# Patient Record
Sex: Male | Born: 1950
Health system: Southern US, Community
[De-identification: ages and names within clinical notes are randomized; demographics above are authoritative.]

## PROBLEM LIST (undated history)

## (undated) DIAGNOSIS — C189 Malignant neoplasm of colon, unspecified: Secondary | ICD-10-CM

## (undated) DIAGNOSIS — M1990 Unspecified osteoarthritis, unspecified site: Secondary | ICD-10-CM

## (undated) DIAGNOSIS — Z8619 Personal history of other infectious and parasitic diseases: Secondary | ICD-10-CM

## (undated) DIAGNOSIS — J302 Other seasonal allergic rhinitis: Secondary | ICD-10-CM

## (undated) DIAGNOSIS — F419 Anxiety disorder, unspecified: Secondary | ICD-10-CM

## (undated) DIAGNOSIS — Z87442 Personal history of urinary calculi: Secondary | ICD-10-CM

## (undated) DIAGNOSIS — M549 Dorsalgia, unspecified: Secondary | ICD-10-CM

## (undated) DIAGNOSIS — R899 Unspecified abnormal finding in specimens from other organs, systems and tissues: Secondary | ICD-10-CM

## (undated) DIAGNOSIS — F32A Depression, unspecified: Secondary | ICD-10-CM

## (undated) DIAGNOSIS — N200 Calculus of kidney: Secondary | ICD-10-CM

## (undated) DIAGNOSIS — Z9221 Personal history of antineoplastic chemotherapy: Secondary | ICD-10-CM

## (undated) DIAGNOSIS — C9000 Multiple myeloma not having achieved remission: Secondary | ICD-10-CM

## (undated) DIAGNOSIS — F329 Major depressive disorder, single episode, unspecified: Secondary | ICD-10-CM

## (undated) DIAGNOSIS — E039 Hypothyroidism, unspecified: Secondary | ICD-10-CM

## (undated) DIAGNOSIS — T7840XA Allergy, unspecified, initial encounter: Secondary | ICD-10-CM

## (undated) DIAGNOSIS — J45909 Unspecified asthma, uncomplicated: Secondary | ICD-10-CM

## (undated) DIAGNOSIS — G4700 Insomnia, unspecified: Secondary | ICD-10-CM

## (undated) DIAGNOSIS — M810 Age-related osteoporosis without current pathological fracture: Secondary | ICD-10-CM

## (undated) DIAGNOSIS — K219 Gastro-esophageal reflux disease without esophagitis: Secondary | ICD-10-CM

## (undated) DIAGNOSIS — G8929 Other chronic pain: Secondary | ICD-10-CM

## (undated) HISTORY — DX: Other chronic pain: G89.29

## (undated) HISTORY — PX: SPINAL CORD STIMULATOR IMPLANT: SHX2422

## (undated) HISTORY — DX: Multiple myeloma not having achieved remission: C90.00

## (undated) HISTORY — PX: LUMBAR FUSION: SHX111

## (undated) HISTORY — PX: BACK SURGERY: SHX140

## (undated) HISTORY — DX: Anxiety disorder, unspecified: F41.9

## (undated) HISTORY — PX: SHOULDER SURGERY: SHX246

## (undated) HISTORY — DX: Allergy, unspecified, initial encounter: T78.40XA

## (undated) HISTORY — DX: Calculus of kidney: N20.0

## (undated) HISTORY — DX: Age-related osteoporosis without current pathological fracture: M81.0

## (undated) HISTORY — DX: Malignant neoplasm of colon, unspecified: C18.9

## (undated) HISTORY — PX: SACROILIAC JOINT FUSION: SHX6088

## (undated) HISTORY — DX: Dorsalgia, unspecified: M54.9

## (undated) HISTORY — DX: Unspecified asthma, uncomplicated: J45.909

## (undated) HISTORY — PX: EYE SURGERY: SHX253

## (undated) HISTORY — PX: COLONOSCOPY: SHX174

## (undated) HISTORY — DX: Depression, unspecified: F32.A

## (undated) HISTORY — DX: Personal history of antineoplastic chemotherapy: Z92.21

## (undated) HISTORY — DX: Major depressive disorder, single episode, unspecified: F32.9

---
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
## 1968-02-06 HISTORY — PX: HERNIA REPAIR: SHX51

## 1969-12-08 DIAGNOSIS — F419 Anxiety disorder, unspecified: Secondary | ICD-10-CM

## 1969-12-08 HISTORY — DX: Anxiety disorder, unspecified: F41.9

## 1999-02-06 HISTORY — PX: COLON SURGERY: SHX602

## 1999-11-06 DIAGNOSIS — C189 Malignant neoplasm of colon, unspecified: Secondary | ICD-10-CM

## 1999-11-06 HISTORY — DX: Malignant neoplasm of colon, unspecified: C18.9

## 2010-10-07 HISTORY — PX: ESOPHAGOGASTRODUODENOSCOPY: SHX1529

## 2011-12-07 DIAGNOSIS — C9000 Multiple myeloma not having achieved remission: Secondary | ICD-10-CM

## 2011-12-07 HISTORY — DX: Multiple myeloma not having achieved remission: C90.00

## 2012-06-27 ENCOUNTER — Ambulatory Visit: Payer: Self-pay | Admitting: Internal Medicine

## 2012-06-27 LAB — HEPATIC FUNCTION PANEL A (ARMC)
Albumin: 3.5 g/dL (ref 3.4–5.0)
Alkaline Phosphatase: 91 U/L (ref 50–136)
SGOT(AST): 17 U/L (ref 15–37)
Total Protein: 6.7 g/dL (ref 6.4–8.2)

## 2012-06-27 LAB — CBC CANCER CENTER
Eosinophil #: 0.1 x10 3/mm (ref 0.0–0.7)
Eosinophil %: 2.4 %
HGB: 13.4 g/dL (ref 13.0–18.0)
Lymphocyte #: 1.9 x10 3/mm (ref 1.0–3.6)
Lymphocyte %: 31 %
MCH: 31.4 pg (ref 26.0–34.0)
MCV: 94 fL (ref 80–100)
Neutrophil %: 58.5 %
Platelet: 335 x10 3/mm (ref 150–440)
RDW: 14 % (ref 11.5–14.5)
WBC: 6.2 x10 3/mm (ref 3.8–10.6)

## 2012-06-27 LAB — CREATININE, SERUM
Creatinine: 1.14 mg/dL (ref 0.60–1.30)
EGFR (African American): >60

## 2012-07-01 LAB — PROT IMMUNOELECTROPHORES(ARMC)

## 2012-07-01 LAB — URINE IEP, RANDOM

## 2012-07-01 LAB — CEA: CEA: 2.2 ng/mL (ref 0.0–4.7)

## 2012-07-06 ENCOUNTER — Ambulatory Visit: Payer: Self-pay | Admitting: Internal Medicine

## 2012-07-22 ENCOUNTER — Emergency Department: Payer: Self-pay | Admitting: Emergency Medicine

## 2012-08-11 ENCOUNTER — Ambulatory Visit: Payer: Self-pay | Admitting: Pain Medicine

## 2012-08-13 ENCOUNTER — Ambulatory Visit: Payer: Self-pay | Admitting: Pain Medicine

## 2012-08-27 ENCOUNTER — Ambulatory Visit: Payer: Self-pay | Admitting: Pain Medicine

## 2012-08-29 ENCOUNTER — Ambulatory Visit: Payer: Self-pay | Admitting: Pain Medicine

## 2012-09-02 ENCOUNTER — Ambulatory Visit: Payer: Self-pay | Admitting: Pain Medicine

## 2012-09-17 ENCOUNTER — Ambulatory Visit: Payer: Self-pay | Admitting: Pain Medicine

## 2012-11-07 ENCOUNTER — Ambulatory Visit: Payer: Self-pay | Admitting: Specialist

## 2012-12-11 ENCOUNTER — Ambulatory Visit: Payer: Self-pay | Admitting: Family Medicine

## 2012-12-26 ENCOUNTER — Ambulatory Visit: Payer: Self-pay | Admitting: Internal Medicine

## 2012-12-26 LAB — CBC CANCER CENTER
Basophil %: 0.3 %
HCT: 44.1 % (ref 40.0–52.0)
HGB: 14.7 g/dL (ref 13.0–18.0)
Lymphocyte %: 14.6 %
MCH: 32.6 pg (ref 26.0–34.0)
Neutrophil #: 7.7 x10 3/mm — ABNORMAL HIGH (ref 1.4–6.5)
Neutrophil %: 78.8 %
RBC: 4.53 10*6/uL (ref 4.40–5.90)
RDW: 12.4 % (ref 11.5–14.5)

## 2012-12-26 LAB — CREATININE, SERUM
EGFR (African American): >60
EGFR (Non-African Amer.): >60

## 2012-12-26 LAB — CALCIUM: Calcium, Total: 9.2 mg/dL (ref 8.5–10.1)

## 2012-12-29 LAB — PROT IMMUNOELECTROPHORES(ARMC)

## 2013-01-05 ENCOUNTER — Ambulatory Visit: Payer: Self-pay | Admitting: Internal Medicine

## 2013-06-25 ENCOUNTER — Ambulatory Visit: Payer: Self-pay | Admitting: Internal Medicine

## 2013-06-25 LAB — CBC CANCER CENTER
BASOS ABS: 0.1 x10 3/mm (ref 0.0–0.1)
BASOS PCT: 2.5 %
EOS ABS: 0.2 x10 3/mm (ref 0.0–0.7)
Eosinophil %: 3.1 %
HCT: 39.5 % — ABNORMAL LOW (ref 40.0–52.0)
HGB: 13.8 g/dL (ref 13.0–18.0)
LYMPHS PCT: 35.1 %
Lymphocyte #: 2 x10 3/mm (ref 1.0–3.6)
MCH: 32.3 pg (ref 26.0–34.0)
MCHC: 34.9 g/dL (ref 32.0–36.0)
MCV: 92 fL (ref 80–100)
Monocyte #: 0.4 x10 3/mm (ref 0.2–1.0)
Monocyte %: 6.3 %
Neutrophil #: 3.1 x10 3/mm (ref 1.4–6.5)
Neutrophil %: 53 %
PLATELETS: 322 x10 3/mm (ref 150–440)
RBC: 4.27 10*6/uL — ABNORMAL LOW (ref 4.40–5.90)
RDW: 12.1 % (ref 11.5–14.5)
WBC: 5.8 x10 3/mm (ref 3.8–10.6)

## 2013-06-25 LAB — CREATININE, SERUM
CREATININE: 1.19 mg/dL (ref 0.60–1.30)
EGFR (African American): >60

## 2013-06-25 LAB — CALCIUM: Calcium, Total: 8.6 mg/dL (ref 8.5–10.1)

## 2013-06-30 LAB — PROT IMMUNOELECTROPHORES(ARMC)

## 2013-06-30 LAB — KAPPA/LAMBDA FREE LIGHT CHAINS (ARMC)

## 2013-07-06 ENCOUNTER — Ambulatory Visit: Payer: Self-pay | Admitting: Internal Medicine

## 2013-12-28 ENCOUNTER — Ambulatory Visit: Payer: Self-pay | Admitting: Internal Medicine

## 2013-12-28 DIAGNOSIS — Z79899 Other long term (current) drug therapy: Secondary | ICD-10-CM | POA: Diagnosis not present

## 2013-12-28 DIAGNOSIS — Z85038 Personal history of other malignant neoplasm of large intestine: Secondary | ICD-10-CM | POA: Diagnosis not present

## 2013-12-28 DIAGNOSIS — C9000 Multiple myeloma not having achieved remission: Secondary | ICD-10-CM | POA: Diagnosis not present

## 2013-12-28 DIAGNOSIS — M818 Other osteoporosis without current pathological fracture: Secondary | ICD-10-CM | POA: Diagnosis not present

## 2013-12-28 LAB — CBC CANCER CENTER
Basophil #: 0 x10 3/mm (ref 0.0–0.1)
Basophil %: 0.6 %
EOS PCT: 1.7 %
Eosinophil #: 0.1 x10 3/mm (ref 0.0–0.7)
HCT: 43.2 % (ref 40.0–52.0)
HGB: 14.4 g/dL (ref 13.0–18.0)
LYMPHS ABS: 2.4 x10 3/mm (ref 1.0–3.6)
LYMPHS PCT: 33.3 %
MCH: 32.6 pg (ref 26.0–34.0)
MCHC: 33.3 g/dL (ref 32.0–36.0)
MCV: 98 fL (ref 80–100)
MONOS PCT: 7.3 %
Monocyte #: 0.5 x10 3/mm (ref 0.2–1.0)
Neutrophil #: 4.1 x10 3/mm (ref 1.4–6.5)
Neutrophil %: 57.1 %
Platelet: 363 x10 3/mm (ref 150–440)
RBC: 4.41 10*6/uL (ref 4.40–5.90)
RDW: 12.5 % (ref 11.5–14.5)
WBC: 7.2 x10 3/mm (ref 3.8–10.6)

## 2013-12-28 LAB — CREATININE, SERUM
CREATININE: 1.34 mg/dL — AB (ref 0.60–1.30)
EGFR (African American): >60
EGFR (Non-African Amer.): 57 — ABNORMAL LOW

## 2013-12-28 LAB — CALCIUM: Calcium, Total: 9.6 mg/dL (ref 8.5–10.1)

## 2013-12-29 LAB — PROT IMMUNOELECTROPHORES(ARMC)

## 2013-12-29 LAB — KAPPA/LAMBDA FREE LIGHT CHAINS (ARMC)

## 2014-01-05 ENCOUNTER — Ambulatory Visit: Payer: Self-pay | Admitting: Internal Medicine

## 2014-01-22 DIAGNOSIS — M62830 Muscle spasm of back: Secondary | ICD-10-CM | POA: Diagnosis not present

## 2014-01-22 DIAGNOSIS — M9902 Segmental and somatic dysfunction of thoracic region: Secondary | ICD-10-CM | POA: Diagnosis not present

## 2014-01-22 DIAGNOSIS — M9901 Segmental and somatic dysfunction of cervical region: Secondary | ICD-10-CM | POA: Diagnosis not present

## 2014-01-22 DIAGNOSIS — M5414 Radiculopathy, thoracic region: Secondary | ICD-10-CM | POA: Diagnosis not present

## 2014-01-23 DIAGNOSIS — M9902 Segmental and somatic dysfunction of thoracic region: Secondary | ICD-10-CM | POA: Diagnosis not present

## 2014-01-23 DIAGNOSIS — M5414 Radiculopathy, thoracic region: Secondary | ICD-10-CM | POA: Diagnosis not present

## 2014-01-23 DIAGNOSIS — M62830 Muscle spasm of back: Secondary | ICD-10-CM | POA: Diagnosis not present

## 2014-01-23 DIAGNOSIS — M9901 Segmental and somatic dysfunction of cervical region: Secondary | ICD-10-CM | POA: Diagnosis not present

## 2014-01-26 DIAGNOSIS — M9901 Segmental and somatic dysfunction of cervical region: Secondary | ICD-10-CM | POA: Diagnosis not present

## 2014-01-26 DIAGNOSIS — M62830 Muscle spasm of back: Secondary | ICD-10-CM | POA: Diagnosis not present

## 2014-01-26 DIAGNOSIS — M5414 Radiculopathy, thoracic region: Secondary | ICD-10-CM | POA: Diagnosis not present

## 2014-01-26 DIAGNOSIS — M9902 Segmental and somatic dysfunction of thoracic region: Secondary | ICD-10-CM | POA: Diagnosis not present

## 2014-01-27 DIAGNOSIS — M5414 Radiculopathy, thoracic region: Secondary | ICD-10-CM | POA: Diagnosis not present

## 2014-01-27 DIAGNOSIS — M9902 Segmental and somatic dysfunction of thoracic region: Secondary | ICD-10-CM | POA: Diagnosis not present

## 2014-01-27 DIAGNOSIS — M62830 Muscle spasm of back: Secondary | ICD-10-CM | POA: Diagnosis not present

## 2014-01-27 DIAGNOSIS — M9901 Segmental and somatic dysfunction of cervical region: Secondary | ICD-10-CM | POA: Diagnosis not present

## 2014-01-28 DIAGNOSIS — M9902 Segmental and somatic dysfunction of thoracic region: Secondary | ICD-10-CM | POA: Diagnosis not present

## 2014-01-28 DIAGNOSIS — M9901 Segmental and somatic dysfunction of cervical region: Secondary | ICD-10-CM | POA: Diagnosis not present

## 2014-01-28 DIAGNOSIS — M5414 Radiculopathy, thoracic region: Secondary | ICD-10-CM | POA: Diagnosis not present

## 2014-01-28 DIAGNOSIS — M62830 Muscle spasm of back: Secondary | ICD-10-CM | POA: Diagnosis not present

## 2014-02-02 DIAGNOSIS — M9902 Segmental and somatic dysfunction of thoracic region: Secondary | ICD-10-CM | POA: Diagnosis not present

## 2014-02-02 DIAGNOSIS — M5414 Radiculopathy, thoracic region: Secondary | ICD-10-CM | POA: Diagnosis not present

## 2014-02-02 DIAGNOSIS — M62830 Muscle spasm of back: Secondary | ICD-10-CM | POA: Diagnosis not present

## 2014-02-02 DIAGNOSIS — M9901 Segmental and somatic dysfunction of cervical region: Secondary | ICD-10-CM | POA: Diagnosis not present

## 2014-02-03 DIAGNOSIS — M5414 Radiculopathy, thoracic region: Secondary | ICD-10-CM | POA: Diagnosis not present

## 2014-02-03 DIAGNOSIS — M9902 Segmental and somatic dysfunction of thoracic region: Secondary | ICD-10-CM | POA: Diagnosis not present

## 2014-02-03 DIAGNOSIS — M62830 Muscle spasm of back: Secondary | ICD-10-CM | POA: Diagnosis not present

## 2014-02-03 DIAGNOSIS — M9901 Segmental and somatic dysfunction of cervical region: Secondary | ICD-10-CM | POA: Diagnosis not present

## 2014-02-04 DIAGNOSIS — M62830 Muscle spasm of back: Secondary | ICD-10-CM | POA: Diagnosis not present

## 2014-02-04 DIAGNOSIS — M9901 Segmental and somatic dysfunction of cervical region: Secondary | ICD-10-CM | POA: Diagnosis not present

## 2014-02-04 DIAGNOSIS — M9902 Segmental and somatic dysfunction of thoracic region: Secondary | ICD-10-CM | POA: Diagnosis not present

## 2014-02-04 DIAGNOSIS — M5414 Radiculopathy, thoracic region: Secondary | ICD-10-CM | POA: Diagnosis not present

## 2014-02-08 DIAGNOSIS — M62830 Muscle spasm of back: Secondary | ICD-10-CM | POA: Diagnosis not present

## 2014-02-08 DIAGNOSIS — M9901 Segmental and somatic dysfunction of cervical region: Secondary | ICD-10-CM | POA: Diagnosis not present

## 2014-02-08 DIAGNOSIS — M5414 Radiculopathy, thoracic region: Secondary | ICD-10-CM | POA: Diagnosis not present

## 2014-02-08 DIAGNOSIS — M9902 Segmental and somatic dysfunction of thoracic region: Secondary | ICD-10-CM | POA: Diagnosis not present

## 2014-02-10 DIAGNOSIS — M62830 Muscle spasm of back: Secondary | ICD-10-CM | POA: Diagnosis not present

## 2014-02-10 DIAGNOSIS — M5414 Radiculopathy, thoracic region: Secondary | ICD-10-CM | POA: Diagnosis not present

## 2014-02-10 DIAGNOSIS — M9901 Segmental and somatic dysfunction of cervical region: Secondary | ICD-10-CM | POA: Diagnosis not present

## 2014-02-10 DIAGNOSIS — M9902 Segmental and somatic dysfunction of thoracic region: Secondary | ICD-10-CM | POA: Diagnosis not present

## 2014-02-11 DIAGNOSIS — M62830 Muscle spasm of back: Secondary | ICD-10-CM | POA: Diagnosis not present

## 2014-02-11 DIAGNOSIS — M5414 Radiculopathy, thoracic region: Secondary | ICD-10-CM | POA: Diagnosis not present

## 2014-02-11 DIAGNOSIS — M9902 Segmental and somatic dysfunction of thoracic region: Secondary | ICD-10-CM | POA: Diagnosis not present

## 2014-02-11 DIAGNOSIS — M9901 Segmental and somatic dysfunction of cervical region: Secondary | ICD-10-CM | POA: Diagnosis not present

## 2014-02-15 DIAGNOSIS — M9901 Segmental and somatic dysfunction of cervical region: Secondary | ICD-10-CM | POA: Diagnosis not present

## 2014-02-15 DIAGNOSIS — M5414 Radiculopathy, thoracic region: Secondary | ICD-10-CM | POA: Diagnosis not present

## 2014-02-15 DIAGNOSIS — M62830 Muscle spasm of back: Secondary | ICD-10-CM | POA: Diagnosis not present

## 2014-02-15 DIAGNOSIS — M9902 Segmental and somatic dysfunction of thoracic region: Secondary | ICD-10-CM | POA: Diagnosis not present

## 2014-02-17 DIAGNOSIS — M62830 Muscle spasm of back: Secondary | ICD-10-CM | POA: Diagnosis not present

## 2014-02-17 DIAGNOSIS — M9902 Segmental and somatic dysfunction of thoracic region: Secondary | ICD-10-CM | POA: Diagnosis not present

## 2014-02-17 DIAGNOSIS — M9901 Segmental and somatic dysfunction of cervical region: Secondary | ICD-10-CM | POA: Diagnosis not present

## 2014-02-17 DIAGNOSIS — M5414 Radiculopathy, thoracic region: Secondary | ICD-10-CM | POA: Diagnosis not present

## 2014-02-18 DIAGNOSIS — M9901 Segmental and somatic dysfunction of cervical region: Secondary | ICD-10-CM | POA: Diagnosis not present

## 2014-02-18 DIAGNOSIS — M9902 Segmental and somatic dysfunction of thoracic region: Secondary | ICD-10-CM | POA: Diagnosis not present

## 2014-02-18 DIAGNOSIS — M5414 Radiculopathy, thoracic region: Secondary | ICD-10-CM | POA: Diagnosis not present

## 2014-02-18 DIAGNOSIS — M62830 Muscle spasm of back: Secondary | ICD-10-CM | POA: Diagnosis not present

## 2014-02-22 DIAGNOSIS — M5414 Radiculopathy, thoracic region: Secondary | ICD-10-CM | POA: Diagnosis not present

## 2014-02-22 DIAGNOSIS — M9901 Segmental and somatic dysfunction of cervical region: Secondary | ICD-10-CM | POA: Diagnosis not present

## 2014-02-22 DIAGNOSIS — M9902 Segmental and somatic dysfunction of thoracic region: Secondary | ICD-10-CM | POA: Diagnosis not present

## 2014-02-22 DIAGNOSIS — M62830 Muscle spasm of back: Secondary | ICD-10-CM | POA: Diagnosis not present

## 2014-02-24 DIAGNOSIS — M5414 Radiculopathy, thoracic region: Secondary | ICD-10-CM | POA: Diagnosis not present

## 2014-02-24 DIAGNOSIS — M9902 Segmental and somatic dysfunction of thoracic region: Secondary | ICD-10-CM | POA: Diagnosis not present

## 2014-02-24 DIAGNOSIS — M62830 Muscle spasm of back: Secondary | ICD-10-CM | POA: Diagnosis not present

## 2014-02-24 DIAGNOSIS — M9901 Segmental and somatic dysfunction of cervical region: Secondary | ICD-10-CM | POA: Diagnosis not present

## 2014-03-01 DIAGNOSIS — M9901 Segmental and somatic dysfunction of cervical region: Secondary | ICD-10-CM | POA: Diagnosis not present

## 2014-03-01 DIAGNOSIS — M62830 Muscle spasm of back: Secondary | ICD-10-CM | POA: Diagnosis not present

## 2014-03-01 DIAGNOSIS — M5414 Radiculopathy, thoracic region: Secondary | ICD-10-CM | POA: Diagnosis not present

## 2014-03-01 DIAGNOSIS — M9902 Segmental and somatic dysfunction of thoracic region: Secondary | ICD-10-CM | POA: Diagnosis not present

## 2014-03-04 DIAGNOSIS — M9901 Segmental and somatic dysfunction of cervical region: Secondary | ICD-10-CM | POA: Diagnosis not present

## 2014-03-04 DIAGNOSIS — M62830 Muscle spasm of back: Secondary | ICD-10-CM | POA: Diagnosis not present

## 2014-03-04 DIAGNOSIS — M5414 Radiculopathy, thoracic region: Secondary | ICD-10-CM | POA: Diagnosis not present

## 2014-03-04 DIAGNOSIS — M9902 Segmental and somatic dysfunction of thoracic region: Secondary | ICD-10-CM | POA: Diagnosis not present

## 2014-03-08 DIAGNOSIS — M62830 Muscle spasm of back: Secondary | ICD-10-CM | POA: Diagnosis not present

## 2014-03-08 DIAGNOSIS — M5414 Radiculopathy, thoracic region: Secondary | ICD-10-CM | POA: Diagnosis not present

## 2014-03-08 DIAGNOSIS — M9902 Segmental and somatic dysfunction of thoracic region: Secondary | ICD-10-CM | POA: Diagnosis not present

## 2014-03-08 DIAGNOSIS — M9901 Segmental and somatic dysfunction of cervical region: Secondary | ICD-10-CM | POA: Diagnosis not present

## 2014-03-11 DIAGNOSIS — M9901 Segmental and somatic dysfunction of cervical region: Secondary | ICD-10-CM | POA: Diagnosis not present

## 2014-03-11 DIAGNOSIS — M62830 Muscle spasm of back: Secondary | ICD-10-CM | POA: Diagnosis not present

## 2014-03-11 DIAGNOSIS — M9902 Segmental and somatic dysfunction of thoracic region: Secondary | ICD-10-CM | POA: Diagnosis not present

## 2014-03-11 DIAGNOSIS — M5414 Radiculopathy, thoracic region: Secondary | ICD-10-CM | POA: Diagnosis not present

## 2014-03-15 DIAGNOSIS — M62830 Muscle spasm of back: Secondary | ICD-10-CM | POA: Diagnosis not present

## 2014-03-15 DIAGNOSIS — M5414 Radiculopathy, thoracic region: Secondary | ICD-10-CM | POA: Diagnosis not present

## 2014-03-15 DIAGNOSIS — M9902 Segmental and somatic dysfunction of thoracic region: Secondary | ICD-10-CM | POA: Diagnosis not present

## 2014-03-15 DIAGNOSIS — M9901 Segmental and somatic dysfunction of cervical region: Secondary | ICD-10-CM | POA: Diagnosis not present

## 2014-03-18 DIAGNOSIS — M9902 Segmental and somatic dysfunction of thoracic region: Secondary | ICD-10-CM | POA: Diagnosis not present

## 2014-03-18 DIAGNOSIS — M5414 Radiculopathy, thoracic region: Secondary | ICD-10-CM | POA: Diagnosis not present

## 2014-03-18 DIAGNOSIS — M62830 Muscle spasm of back: Secondary | ICD-10-CM | POA: Diagnosis not present

## 2014-03-18 DIAGNOSIS — M9901 Segmental and somatic dysfunction of cervical region: Secondary | ICD-10-CM | POA: Diagnosis not present

## 2014-03-25 DIAGNOSIS — M9901 Segmental and somatic dysfunction of cervical region: Secondary | ICD-10-CM | POA: Diagnosis not present

## 2014-03-25 DIAGNOSIS — M9902 Segmental and somatic dysfunction of thoracic region: Secondary | ICD-10-CM | POA: Diagnosis not present

## 2014-03-25 DIAGNOSIS — M5414 Radiculopathy, thoracic region: Secondary | ICD-10-CM | POA: Diagnosis not present

## 2014-03-25 DIAGNOSIS — M62830 Muscle spasm of back: Secondary | ICD-10-CM | POA: Diagnosis not present

## 2014-03-29 DIAGNOSIS — M5414 Radiculopathy, thoracic region: Secondary | ICD-10-CM | POA: Diagnosis not present

## 2014-03-29 DIAGNOSIS — M62830 Muscle spasm of back: Secondary | ICD-10-CM | POA: Diagnosis not present

## 2014-03-29 DIAGNOSIS — M9902 Segmental and somatic dysfunction of thoracic region: Secondary | ICD-10-CM | POA: Diagnosis not present

## 2014-03-29 DIAGNOSIS — M9901 Segmental and somatic dysfunction of cervical region: Secondary | ICD-10-CM | POA: Diagnosis not present

## 2014-03-31 DIAGNOSIS — M9901 Segmental and somatic dysfunction of cervical region: Secondary | ICD-10-CM | POA: Diagnosis not present

## 2014-03-31 DIAGNOSIS — M9902 Segmental and somatic dysfunction of thoracic region: Secondary | ICD-10-CM | POA: Diagnosis not present

## 2014-03-31 DIAGNOSIS — M5414 Radiculopathy, thoracic region: Secondary | ICD-10-CM | POA: Diagnosis not present

## 2014-03-31 DIAGNOSIS — M62830 Muscle spasm of back: Secondary | ICD-10-CM | POA: Diagnosis not present

## 2014-04-05 DIAGNOSIS — M62830 Muscle spasm of back: Secondary | ICD-10-CM | POA: Diagnosis not present

## 2014-04-05 DIAGNOSIS — M9902 Segmental and somatic dysfunction of thoracic region: Secondary | ICD-10-CM | POA: Diagnosis not present

## 2014-04-05 DIAGNOSIS — M9901 Segmental and somatic dysfunction of cervical region: Secondary | ICD-10-CM | POA: Diagnosis not present

## 2014-04-05 DIAGNOSIS — M5414 Radiculopathy, thoracic region: Secondary | ICD-10-CM | POA: Diagnosis not present

## 2014-04-07 DIAGNOSIS — M5414 Radiculopathy, thoracic region: Secondary | ICD-10-CM | POA: Diagnosis not present

## 2014-04-07 DIAGNOSIS — M62830 Muscle spasm of back: Secondary | ICD-10-CM | POA: Diagnosis not present

## 2014-04-07 DIAGNOSIS — M9901 Segmental and somatic dysfunction of cervical region: Secondary | ICD-10-CM | POA: Diagnosis not present

## 2014-04-07 DIAGNOSIS — M9902 Segmental and somatic dysfunction of thoracic region: Secondary | ICD-10-CM | POA: Diagnosis not present

## 2014-04-12 DIAGNOSIS — M9902 Segmental and somatic dysfunction of thoracic region: Secondary | ICD-10-CM | POA: Diagnosis not present

## 2014-04-12 DIAGNOSIS — M62830 Muscle spasm of back: Secondary | ICD-10-CM | POA: Diagnosis not present

## 2014-04-12 DIAGNOSIS — M9901 Segmental and somatic dysfunction of cervical region: Secondary | ICD-10-CM | POA: Diagnosis not present

## 2014-04-12 DIAGNOSIS — M5414 Radiculopathy, thoracic region: Secondary | ICD-10-CM | POA: Diagnosis not present

## 2014-04-22 DIAGNOSIS — M62830 Muscle spasm of back: Secondary | ICD-10-CM | POA: Diagnosis not present

## 2014-04-22 DIAGNOSIS — M9902 Segmental and somatic dysfunction of thoracic region: Secondary | ICD-10-CM | POA: Diagnosis not present

## 2014-04-22 DIAGNOSIS — M5414 Radiculopathy, thoracic region: Secondary | ICD-10-CM | POA: Diagnosis not present

## 2014-04-22 DIAGNOSIS — M9901 Segmental and somatic dysfunction of cervical region: Secondary | ICD-10-CM | POA: Diagnosis not present

## 2014-04-29 DIAGNOSIS — M62830 Muscle spasm of back: Secondary | ICD-10-CM | POA: Diagnosis not present

## 2014-04-29 DIAGNOSIS — M9902 Segmental and somatic dysfunction of thoracic region: Secondary | ICD-10-CM | POA: Diagnosis not present

## 2014-04-29 DIAGNOSIS — M9901 Segmental and somatic dysfunction of cervical region: Secondary | ICD-10-CM | POA: Diagnosis not present

## 2014-04-29 DIAGNOSIS — M5414 Radiculopathy, thoracic region: Secondary | ICD-10-CM | POA: Diagnosis not present

## 2014-05-06 DIAGNOSIS — M5414 Radiculopathy, thoracic region: Secondary | ICD-10-CM | POA: Diagnosis not present

## 2014-05-06 DIAGNOSIS — M9901 Segmental and somatic dysfunction of cervical region: Secondary | ICD-10-CM | POA: Diagnosis not present

## 2014-05-06 DIAGNOSIS — M9902 Segmental and somatic dysfunction of thoracic region: Secondary | ICD-10-CM | POA: Diagnosis not present

## 2014-05-06 DIAGNOSIS — M62830 Muscle spasm of back: Secondary | ICD-10-CM | POA: Diagnosis not present

## 2014-05-12 DIAGNOSIS — M19041 Primary osteoarthritis, right hand: Secondary | ICD-10-CM | POA: Diagnosis not present

## 2014-05-12 DIAGNOSIS — M19042 Primary osteoarthritis, left hand: Secondary | ICD-10-CM | POA: Diagnosis not present

## 2014-05-13 DIAGNOSIS — M62830 Muscle spasm of back: Secondary | ICD-10-CM | POA: Diagnosis not present

## 2014-05-13 DIAGNOSIS — M9902 Segmental and somatic dysfunction of thoracic region: Secondary | ICD-10-CM | POA: Diagnosis not present

## 2014-05-13 DIAGNOSIS — M5414 Radiculopathy, thoracic region: Secondary | ICD-10-CM | POA: Diagnosis not present

## 2014-05-13 DIAGNOSIS — M9901 Segmental and somatic dysfunction of cervical region: Secondary | ICD-10-CM | POA: Diagnosis not present

## 2014-05-20 DIAGNOSIS — M9901 Segmental and somatic dysfunction of cervical region: Secondary | ICD-10-CM | POA: Diagnosis not present

## 2014-05-20 DIAGNOSIS — M5414 Radiculopathy, thoracic region: Secondary | ICD-10-CM | POA: Diagnosis not present

## 2014-05-20 DIAGNOSIS — M9902 Segmental and somatic dysfunction of thoracic region: Secondary | ICD-10-CM | POA: Diagnosis not present

## 2014-05-20 DIAGNOSIS — M62830 Muscle spasm of back: Secondary | ICD-10-CM | POA: Diagnosis not present

## 2014-06-10 DIAGNOSIS — M5414 Radiculopathy, thoracic region: Secondary | ICD-10-CM | POA: Diagnosis not present

## 2014-06-10 DIAGNOSIS — M62830 Muscle spasm of back: Secondary | ICD-10-CM | POA: Diagnosis not present

## 2014-06-10 DIAGNOSIS — M9901 Segmental and somatic dysfunction of cervical region: Secondary | ICD-10-CM | POA: Diagnosis not present

## 2014-06-10 DIAGNOSIS — M9902 Segmental and somatic dysfunction of thoracic region: Secondary | ICD-10-CM | POA: Diagnosis not present

## 2014-06-17 DIAGNOSIS — M62830 Muscle spasm of back: Secondary | ICD-10-CM | POA: Diagnosis not present

## 2014-06-17 DIAGNOSIS — M9901 Segmental and somatic dysfunction of cervical region: Secondary | ICD-10-CM | POA: Diagnosis not present

## 2014-06-17 DIAGNOSIS — M9902 Segmental and somatic dysfunction of thoracic region: Secondary | ICD-10-CM | POA: Diagnosis not present

## 2014-06-17 DIAGNOSIS — M5414 Radiculopathy, thoracic region: Secondary | ICD-10-CM | POA: Diagnosis not present

## 2014-06-28 ENCOUNTER — Inpatient Hospital Stay: Payer: Medicare Other | Attending: Internal Medicine

## 2014-06-28 ENCOUNTER — Other Ambulatory Visit: Payer: Self-pay | Admitting: *Deleted

## 2014-06-28 ENCOUNTER — Encounter (INDEPENDENT_AMBULATORY_CARE_PROVIDER_SITE_OTHER): Payer: Self-pay

## 2014-06-28 DIAGNOSIS — M818 Other osteoporosis without current pathological fracture: Secondary | ICD-10-CM | POA: Diagnosis not present

## 2014-06-28 DIAGNOSIS — C9000 Multiple myeloma not having achieved remission: Secondary | ICD-10-CM

## 2014-06-28 DIAGNOSIS — Z79899 Other long term (current) drug therapy: Secondary | ICD-10-CM | POA: Insufficient documentation

## 2014-06-28 LAB — CBC WITH DIFFERENTIAL/PLATELET
Basophils Absolute: 0 10*3/uL (ref 0–0.1)
Basophils Relative: 1 %
EOS PCT: 2 %
Eosinophils Absolute: 0.1 10*3/uL (ref 0–0.7)
HCT: 42.4 % (ref 40.0–52.0)
Hemoglobin: 14.5 g/dL (ref 13.0–18.0)
LYMPHS ABS: 1.6 10*3/uL (ref 1.0–3.6)
Lymphocytes Relative: 28 %
MCH: 32.5 pg (ref 26.0–34.0)
MCHC: 34.2 g/dL (ref 32.0–36.0)
MCV: 95 fL (ref 80.0–100.0)
MONO ABS: 0.3 10*3/uL (ref 0.2–1.0)
MONOS PCT: 6 %
NEUTROS PCT: 63 %
Neutro Abs: 3.7 10*3/uL (ref 1.4–6.5)
PLATELETS: 355 10*3/uL (ref 150–440)
RBC: 4.47 MIL/uL (ref 4.40–5.90)
RDW: 13 % (ref 11.5–14.5)
WBC: 5.8 10*3/uL (ref 3.8–10.6)

## 2014-06-28 LAB — CREATININE, SERUM
CREATININE: 1.24 mg/dL (ref 0.61–1.24)
GFR calc Af Amer: >60 mL/min (ref >60)
GFR, EST NON AFRICAN AMERICAN: 59 mL/min — AB (ref >60)

## 2014-06-28 LAB — CALCIUM: Calcium: 8.7 mg/dL — ABNORMAL LOW (ref 8.9–10.3)

## 2014-06-29 LAB — PROTEIN ELECTROPHORESIS, SERUM
A/G Ratio: 1.5 (ref 0.7–2.0)
ALBUMIN ELP: 3.8 g/dL (ref 3.2–5.6)
ALPHA-2-GLOBULIN: 0.6 g/dL (ref 0.4–1.2)
Alpha-1-Globulin: 0.2 g/dL (ref 0.1–0.4)
BETA GLOBULIN: 1 g/dL (ref 0.6–1.3)
Gamma Globulin: 0.9 g/dL (ref 0.5–1.6)
Globulin, Total: 2.6 g/dL (ref 2.0–4.5)
M-SPIKE, %: 0.4 g/dL — AB
TOTAL PROTEIN ELP: 6.4 g/dL (ref 6.0–8.5)

## 2014-06-29 LAB — KAPPA/LAMBDA LIGHT CHAINS
KAPPA FREE LGHT CHN: 108.16 mg/L — AB (ref 3.30–19.40)
Kappa, lambda light chain ratio: 6.66 — ABNORMAL HIGH (ref 0.26–1.65)
LAMDA FREE LIGHT CHAINS: 16.25 mg/L (ref 5.71–26.30)

## 2014-07-02 ENCOUNTER — Encounter: Payer: Self-pay | Admitting: *Deleted

## 2014-09-04 ENCOUNTER — Encounter: Payer: Self-pay | Admitting: Emergency Medicine

## 2014-09-04 ENCOUNTER — Emergency Department
Admission: EM | Admit: 2014-09-04 | Discharge: 2014-09-04 | Disposition: A | Discharge status: Home / Self Care | Payer: Medicare Other | Attending: Emergency Medicine | Admitting: Emergency Medicine

## 2014-09-04 DIAGNOSIS — M549 Dorsalgia, unspecified: Secondary | ICD-10-CM | POA: Diagnosis not present

## 2014-09-04 DIAGNOSIS — F10930 Alcohol use, unspecified with withdrawal, uncomplicated: Secondary | ICD-10-CM

## 2014-09-04 DIAGNOSIS — F10230 Alcohol dependence with withdrawal, uncomplicated: Secondary | ICD-10-CM | POA: Diagnosis not present

## 2014-09-04 DIAGNOSIS — Z87891 Personal history of nicotine dependence: Secondary | ICD-10-CM | POA: Diagnosis not present

## 2014-09-04 DIAGNOSIS — G8929 Other chronic pain: Secondary | ICD-10-CM | POA: Insufficient documentation

## 2014-09-04 DIAGNOSIS — F1010 Alcohol abuse, uncomplicated: Secondary | ICD-10-CM | POA: Diagnosis present

## 2014-09-04 HISTORY — DX: Insomnia, unspecified: G47.00

## 2014-09-04 HISTORY — DX: Unspecified osteoarthritis, unspecified site: M19.90

## 2014-09-04 HISTORY — DX: Other seasonal allergic rhinitis: J30.2

## 2014-09-04 HISTORY — DX: Gastro-esophageal reflux disease without esophagitis: K21.9

## 2014-09-04 HISTORY — DX: Unspecified abnormal finding in specimens from other organs, systems and tissues: R89.9

## 2014-09-04 MED ORDER — SODIUM CHLORIDE 0.9 % IV SOLN
1000.0000 mL | Freq: Once | INTRAVENOUS | Status: AC
  Administered 2014-09-04: 1000 mL via INTRAVENOUS

## 2014-09-04 MED ORDER — ONDANSETRON HCL 4 MG/2ML IJ SOLN
4.0000 mg | Freq: Once | INTRAMUSCULAR | Status: AC
  Administered 2014-09-04: 4 mg via INTRAVENOUS
  Filled 2014-09-04: qty 2

## 2014-09-04 MED ORDER — KETOROLAC TROMETHAMINE 30 MG/ML IJ SOLN
30.0000 mg | Freq: Once | INTRAMUSCULAR | Status: AC
  Administered 2014-09-04: 30 mg via INTRAVENOUS
  Filled 2014-09-04: qty 1

## 2014-09-04 MED ORDER — LORAZEPAM 1 MG PO TABS
1.0000 mg | ORAL_TABLET | Freq: Once | ORAL | Status: AC
  Administered 2014-09-04: 1 mg via ORAL
  Filled 2014-09-04: qty 1

## 2014-09-04 NOTE — ED Notes (Signed)
Wife Bethena Roys [phone] had taken pt's belongings home.  Wife was called by RN to bring pt his clothes and to inform her that pt was being discharged.

## 2014-09-04 NOTE — ED Notes (Signed)
ED BHU PLACEMENT JUSTIFICATION Is the patient under IVC or is there intent for IVC: No. Is the patient medically cleared: No. Is there vacancy in the ED BHU: Yes.   Is the population mix appropriate for patient: Yes.   Is the patient awaiting placement in inpatient or outpatient setting: No. Has the patient had a psychiatric consult: No. Survey of unit performed for contraband, proper placement and condition of furniture, tampering with fixtures in bathroom, shower, and each patient room: Yes.  ; Findings:  APPEARANCE/BEHAVIOR calm and cooperative NEURO ASSESSMENT Orientation: time, place and person Hallucinations: No.None noted (Hallucinations) Speech: Normal Gait: normal RESPIRATORY ASSESSMENT Normal expansion.  Clear to auscultation.  No rales, rhonchi, or wheezing. CARDIOVASCULAR ASSESSMENT regular rate and rhythm, S1, S2 normal, no murmur, click, rub or gallop GASTROINTESTINAL ASSESSMENT Soft, BS WNL, tender (bowel resection/colon CA 11/1999) EXTREMITIES normal strength, tone, and muscle mass PLAN OF CARE Provide calm/safe environment. Vital signs assessed twice daily. ED BHU Assessment once each 12-hour shift. Collaborate with intake RN daily or as condition indicates. Assure the ED provider has rounded once each shift. Provide and encourage hygiene. Provide redirection as needed. Assess for escalating behavior; address immediately and inform ED provider.  Assess family dynamic and appropriateness for visitation as needed: Yes.  ; If necessary, describe findings:  Educate the patient/family about BHU procedures/visitation: Yes.  ; If necessary, describe findings:

## 2014-09-04 NOTE — ED Notes (Signed)
E-sign attempted, but was not successful.  Message about a component that wasn't working. Advised "re-try" which did not work either.

## 2014-09-04 NOTE — ED Notes (Signed)
Pt stated that he was feeling "much better."

## 2014-09-04 NOTE — ED Notes (Signed)
BEHAVIORAL HEALTH ROUNDING Patient sleeping: No. Patient alert and oriented: yes Behavior appropriate: Yes.  ; If no, describe:  Nutrition and fluids offered: No Toileting and hygiene offered: Yes  Sitter present: not applicable Law enforcement present: Yes

## 2014-09-04 NOTE — Discharge Instructions (Signed)
Alcohol Withdrawal Alcohol withdrawal happens when you normally drink alcohol a lot and suddenly stop drinking. Alcohol withdrawal symptoms can be mild to very bad. Mild withdrawal symptoms can include feeling sick to your stomach (nauseous), headache, or feeling irritable. Bad withdrawal symptoms can include shakiness, being very nervous (anxious), and not thinking clearly.  HOME CARE  Join an alcohol support group.  Stay away from people or situations that make you want to drink.  Eat a healthy diet. Eat a lot of fresh fruits, vegetables, and lean meats. GET HELP RIGHT AWAY IF:   You become confused. You start to see and hear things that are not really there.  You feel your heart beating very fast.  You throw up (vomit) blood or cannot stop throwing up. This may be bright red or look like black coffee grounds.  You have blood in your poop (stool). This may be bright red, maroon colored, or black and tarry.  You are lightheaded or pass out (faint).  You develop a fever. MAKE SURE YOU:   Understand these instructions.  Will watch your condition.  Will get help right away if you are not doing well or get worse. Document Released: 07/11/2007 Document Revised: 04/16/2011 Document Reviewed: 07/11/2007 [facility] Patient Information 2015 [place], [place]. This information is not intended to replace advice given to you by your health care provider. Make sure you discuss any questions you have with your health care provider.  Alcohol Withdrawal Anytime drug use is interfering with normal living activities it has become abuse. This includes problems with family and friends. Psychological dependence has developed when your mind tells you that the drug is needed. This is usually followed by physical dependence when a continuing increase of drugs are required to get the same feeling or "high." This is known as addiction or chemical dependency. A person's risk is much higher if there is a history  of chemical dependency in the family. Mild Withdrawal Following Stopping Alcohol, When Addiction or Chemical Dependency Has Developed When a person has developed tolerance to alcohol, any sudden stopping of alcohol can cause uncomfortable physical symptoms. Most of the time these are mild and consist of tremors in the hands and increases in heart rate, breathing, and temperature. Sometimes these symptoms are associated with anxiety, panic attacks, and bad dreams. There may also be stomach upset. Normal sleep patterns are often interrupted with periods of inability to sleep (insomnia). This may last for 6 months. Because of this discomfort, many people choose to continue drinking to get rid of this discomfort and to try to feel normal. Severe Withdrawal with Decreased or No Alcohol Intake, When Addiction or Chemical Dependency Has Developed About five percent of alcoholics will develop signs of severe withdrawal when they stop using alcohol. One sign of this is development of generalized seizures (convulsions). Other signs of this are severe agitation and confusion. This may be associated with believing in things which are not real or seeing things which are not really there (delusions and hallucinations). Vitamin deficiencies are usually present if alcohol intake has been long-term. Treatment for this most often requires hospitalization and close observation. Addiction can only be helped by stopping use of all chemicals. This is hard but may save your life. With continual alcohol use, possible outcomes are usually loss of self respect and esteem, violence, and death. Addiction cannot be cured but it can be stopped. This often requires outside help and the care of professionals. Treatment centers are listed in the yellow pages  under Cocaine, Narcotics, and Alcoholics Anonymous. Most hospitals and clinics can refer you to a specialized care center. It is not necessary for you to go through the uncomfortable  symptoms of withdrawal. Your caregiver can provide you with medicines that will help you through this difficult period. Try to avoid situations, friends, or drugs that made it possible for you to keep using alcohol in the past. Learn how to say no. It takes a long period of time to overcome addictions to all drugs, including alcohol. There may be many times when you feel as though you want a drink. After getting rid of the physical addiction and withdrawal, you will have a lessening of the craving which tells you that you need alcohol to feel normal. Call your caregiver if more support is needed. Learn who to talk to in your family and among your friends so that during these periods you can receive outside help. Alcoholics Anonymous (AA) has helped many people over the years. To get further help, contact AA or call your caregiver, counselor, or clergyperson. Al-Anon and Alateen are support groups for friends and family members of an alcoholic. The people who love and care for an alcoholic often need help, too. For information about these organizations, check your phone directory or call a local alcoholism treatment center.  SEEK IMMEDIATE MEDICAL CARE IF:   You have a seizure.  You have a fever.  You experience uncontrolled vomiting or you vomit up blood. This may be bright red or look like black coffee grounds.  You have blood in the stool. This may be bright red or appear as a black, tarry, bad-smelling stool.  You become lightheaded or faint. Do not drive if you feel this way. Have someone else drive you or call 500 for help.  You become more agitated or confused.  You develop uncontrolled anxiety.  You begin to see things that are not really there (hallucinate). Your caregiver has determined that you completely understand your medical condition, and that your mental state is back to normal. You understand that you have been treated for alcohol withdrawal, have agreed not to drink any alcohol  for a minimum of 1 day, will not operate a car or other machinery for 24 hours, and have had an opportunity to ask any questions about your condition. Document Released: 11/01/2004 Document Revised: 04/16/2011 Document Reviewed: 09/10/2007 [facility] Patient Information 2015 [place], [place]. This information is not intended to replace advice given to you by your health care provider. Make sure you discuss any questions you have with your health care provider.

## 2014-09-04 NOTE — ED Notes (Signed)
Pt comfortable.  Wife visited.  Pt is alert and oriented.  Pt and wife originally from [place].  Pt's colon resection for colon CA was done at [facility].  Pt is very interactive and seems positive.

## 2014-09-04 NOTE — ED Provider Notes (Signed)
[facility] Emergency Department Provider Note  ____________________________________________  Time seen: On arrival  I have reviewed the triage vital signs and the nursing notes.   HISTORY  Chief Complaint Alcohol Problem    HPI [name] is a 64 y.o. male who presents with complaints of alcohol withdrawal. He reports he stopped taking alcohol approximately 4 days ago because he wants to quit. Typically he would drink 3 drinks a day. He complains of mild nausea, and feeling slightly jittery. He notes his body aches all over. No fevers chills. No chest pain. No shortness of breath.     Past Medical History  Diagnosis Date  . Chronic pain   . Cancer     Colon  . Insomnia   . Arthritis   . Abnormal laboratory test     Sees Dr. Ma Hillock  . Acid reflux   . Seasonal allergies     There are no active problems to display for this patient.   Past Surgical History  Procedure Laterality Date  . Spinal cord stimulator implant    . Sacroiliac joint fusion    . Lumbar fusion      L4-L5, L5-S1  . Colon surgery      resection, 2nd surgery for scar tissue removal  . Shoulder surgery Left     No current outpatient prescriptions on file.  Allergies Bee venom  No family history on file.  Social History History  Substance Use Topics  . Smoking status: Former Research scientist (life sciences)  . Smokeless tobacco: Never Used  . Alcohol Use: No     Comment: Stopped tuesday.     Review of Systems  Constitutional: Negative for fever. Eyes: Negative for visual changes. ENT: Negative for sore throat Cardiovascular: Negative for chest pain. Respiratory: Negative for shortness of breath. Gastrointestinal: Negative for abdominal pain, vomiting and diarrhea. Genitourinary: Negative for dysuria. Musculoskeletal: Chronic back pain Skin: Negative for rash. Neurological: Negative for headaches or focal weakness Psychiatric: No depression  10-point ROS otherwise  negative.  ____________________________________________   PHYSICAL EXAM:  VITAL SIGNS: ED Triage Vitals  Enc Vitals Group     BP 09/04/14 1309 142/67 mmHg     Pulse Rate 09/04/14 1309 63     Resp 09/04/14 1309 18     Temp 09/04/14 1309 98.2 F (36.8 C)     Temp Source 09/04/14 1309 Oral     SpO2 09/04/14 1309 100 %     Weight 09/04/14 1309 127 lb (57.607 kg)     Height 09/04/14 1309 5' 6.5" (1.689 m)     Head Cir --      Peak Flow --      Pain Score 09/04/14 1310 8     Pain Loc --      Pain Edu? --      Excl. in [place]? --      Constitutional: Alert and oriented. Well appearing and in no distress. Eyes: Conjunctivae are normal.  ENT   Head: Normocephalic and atraumatic.   Mouth/Throat: Mucous membranes are moist. Cardiovascular: Normal rate, regular rhythm. Normal and symmetric distal pulses are present in all extremities. No murmurs, rubs, or gallops. Respiratory: Normal respiratory effort without tachypnea nor retractions. Breath sounds are clear and equal bilaterally.  Gastrointestinal: Soft and non-tender in all quadrants. No distention. There is no CVA tenderness. Genitourinary: deferred Musculoskeletal: Nontender with normal range of motion in all extremities. No lower extremity tenderness nor edema. Neurologic:  Normal speech and language. No gross focal neurologic  deficits are appreciated. Skin:  Skin is warm, dry and intact. No rash noted. Psychiatric: Mood and affect are normal. Patient exhibits appropriate insight and judgment.  ____________________________________________    LABS (pertinent positives/negatives)  Labs Reviewed - No data to display  ____________________________________________   EKG  None  ____________________________________________    RADIOLOGY I have personally reviewed any xrays that were ordered on this patient: None  ____________________________________________   PROCEDURES  Procedure(s) performed: none  Critical  Care performed: none  ____________________________________________   INITIAL IMPRESSION / ASSESSMENT AND PLAN / ED COURSE  Pertinent labs & imaging results that were available during my care of the patient were reviewed by me and considered in my medical decision making (see chart for details).  Patient well-appearing and in no acute distress. Heart rate normal. No tremors. No diaphoresis. We will give 1 L normal saline IV, Zofran IV, Toradol IV to help him through his withdrawal. He is not interested in detox he just wants help with current symptoms  ----------------------------------------- 3:12 PM on 09/04/2014 -----------------------------------------  Patient reports feeling significantly better. Vitals are stable. He is okay for discharge. He knows he can return at any time  ____________________________________________   FINAL CLINICAL IMPRESSION(S) / ED DIAGNOSES  Final diagnoses:  Alcohol withdrawal, uncomplicated     Lavonia Drafts, MD 09/04/14 1512

## 2014-09-04 NOTE — ED Notes (Signed)
Infusion (1000 ml) completed.

## 2014-09-04 NOTE — ED Notes (Signed)
Patient states he is detoxing from alcohol (3-4 beers per day, 3-4 ml of Bourbon or whiskey per day prior to that). States he is now having difficulty concentrating since stopping ETOH on Tuesday. Reports diaphoresis and nausea as well.

## 2014-09-04 NOTE — ED Notes (Signed)

## 2014-09-06 ENCOUNTER — Ambulatory Visit (INDEPENDENT_AMBULATORY_CARE_PROVIDER_SITE_OTHER): Payer: Medicare Other | Admitting: Family Medicine

## 2014-09-06 ENCOUNTER — Encounter: Payer: Self-pay | Admitting: Family Medicine

## 2014-09-06 VITALS — BP 140/72 | HR 64 | Temp 99.6°F | Resp 18 | Ht 67.0 in | Wt 129.2 lb

## 2014-09-06 DIAGNOSIS — F10930 Alcohol use, unspecified with withdrawal, uncomplicated: Secondary | ICD-10-CM

## 2014-09-06 DIAGNOSIS — F10230 Alcohol dependence with withdrawal, uncomplicated: Secondary | ICD-10-CM

## 2014-09-06 DIAGNOSIS — F10939 Alcohol use, unspecified with withdrawal, unspecified: Secondary | ICD-10-CM | POA: Insufficient documentation

## 2014-09-06 DIAGNOSIS — F10239 Alcohol dependence with withdrawal, unspecified: Secondary | ICD-10-CM | POA: Insufficient documentation

## 2014-09-06 DIAGNOSIS — G4700 Insomnia, unspecified: Secondary | ICD-10-CM | POA: Insufficient documentation

## 2014-09-06 MED ORDER — LORAZEPAM 0.5 MG PO TABS: 0.5000 mg | ORAL_TABLET | Freq: Two times a day (BID) | ORAL | Status: DC | PRN

## 2014-09-06 MED ORDER — TRAZODONE HCL 100 MG PO TABS: 100.0000 mg | ORAL_TABLET | Freq: Every day | ORAL | Status: DC

## 2014-09-06 NOTE — Progress Notes (Signed)
Name: [name]   MRN: [id number]    DOB: 04/06/1950   Date:09/06/2014       Progress Note  Subjective  Chief Complaint  Chief Complaint  Patient presents with  . Establish Care    NP (Dr. Miles Costain)  . Back Pain    patient wants to discuss back/hip pain  . Gastrophageal Reflux    Back Pain This is a chronic problem. Episode onset: started in 2003 when he moved from [facility] to [place]. The problem occurs constantly. The pain is present in the lumbar spine. The pain is moderate. The pain is the same all the time. Pertinent negatives include no abdominal pain. He has tried NSAIDs and analgesics for the symptoms.  Gastrophageal Reflux He complains of nausea. He reports no abdominal pain, no coughing, no dysphagia, no heartburn or no sore throat. This is a chronic problem. Risk factors include ETOH use. He has tried a PPI for the symptoms. The treatment provided significant relief.  Alcohol Problem Pertinent negatives include no agitation, confusion, delusions, loss of consciousness or somnolence. This is a chronic problem. Associated symptoms include nausea. Past treatments include nothing. The treatment provided moderate relief.  Pt. was seen in the ER 2 days ago for symptoms of anxiety, confusion, stomach cramps and was diagnosed with acute alcohol withdrawal. He was discharged after IV fluids and Ativan 1 mg. He feels better but still experiencing some anxiety.  Insomnia Pt. Is on Trazodone 100 mg at bedtime for insomnia. He has been on Lunesta and Ambien in the past. He has difficulty falling asleep, mainly due to his chronic low back pain.  Past Medical History  Diagnosis Date  . Chronic pain   . Cancer     Colon  . Insomnia   . Arthritis   . Abnormal laboratory test     Sees Dr. Ma Hillock  . Acid reflux   . Seasonal allergies     Past Surgical History  Procedure Laterality Date  . Spinal cord stimulator implant    . Sacroiliac joint fusion    . Lumbar fusion      L4-L5, L5-S1  . Colon  surgery      resection, 2nd surgery for scar tissue removal  . Shoulder surgery Left     History reviewed. No pertinent family history.  History   Social History  . Marital Status: Married    Spouse Name: N/A  . Number of Children: N/A  . Years of Education: N/A   Occupational History  . Not on file.   Social History Main Topics  . Smoking status: Former Research scientist (life sciences)  . Smokeless tobacco: Never Used  . Alcohol Use: No     Comment: Stopped tuesday.   . Drug Use: No  . Sexual Activity: Not on file   Other Topics Concern  . Not on file   Social History Narrative     Current outpatient prescriptions:  .  celecoxib (CELEBREX) 200 MG capsule, Take 200 mg by mouth daily., Disp: , Rfl: 5 .  celecoxib (CELEBREX) 200 MG capsule, Take 1 capsule by mouth daily., Disp: , Rfl:  .  Cholecalciferol (VITAMIN D) 2000 UNITS CAPS, Take 1 capsule by mouth daily., Disp: , Rfl:  .  esomeprazole (NEXIUM 24HR) 20 MG capsule, Take by mouth., Disp: , Rfl:  .  fluticasone (FLONASE) 50 MCG/ACT nasal spray, Place into the nose., Disp: , Rfl:  .  Multiple Vitamins-Minerals (CENTRUM SILVER ULTRA MENS) TABS, Take 1 tablet by mouth daily., Disp: ,  Rfl:  .  traZODone (DESYREL) 100 MG tablet, Take 1 tablet by mouth at bedtime., Disp: , Rfl:   Allergies  Allergen Reactions  . Bee Venom Swelling    Bees     Review of Systems  HENT: Negative for sore throat.   Respiratory: Negative for cough.   Gastrointestinal: Positive for nausea. Negative for heartburn, dysphagia and abdominal pain.  Musculoskeletal: Positive for back pain.  Neurological: Negative for loss of consciousness.  Psychiatric/Behavioral: Negative for confusion and agitation.    Objective  Filed Vitals:   09/06/14 1338  BP: 140/72  Pulse: 64  Temp: 99.6 F (37.6 C)  TempSrc: Oral  Resp: 18  Height: 5\' 7"  (1.702 m)  Weight: 129 lb 3.2 oz (58.605 kg)  SpO2: 98%    Physical Exam  Constitutional: He is well-developed,  well-nourished, and in no distress.  Cardiovascular: Normal rate and regular rhythm.   Pulmonary/Chest: Effort normal and breath sounds normal.  Abdominal: There is tenderness in the epigastric area, periumbilical area and suprapubic area.  Nursing note and vitals reviewed.    Assessment & Plan 1. Insomnia  - traZODone (DESYREL) 100 MG tablet; Take 1 tablet (100 mg total) by mouth at bedtime.  Dispense: 90 tablet; Refill: 0  2. Alcohol withdrawal, uncomplicated Patient feels much better though still anxious after his visit to the [facility] ED. He is requesting medication for anxiety and will be started on lorazepam 0.5 mg twice a day when necessary for symptoms of anxiety.Pt. is aware of the dependence potential of lorazepam and was asked to use the medication only as needed and as directed.He does not want to be referred for counseling at this time. Follow-up in one month. - LORazepam (ATIVAN) 0.5 MG tablet; Take 1 tablet (0.5 mg total) by mouth 2 (two) times daily as needed for anxiety.  Dispense: 60 tablet; Refill: 0    [name] [facility] 09/06/2014 1:53 PM

## 2014-10-04 ENCOUNTER — Encounter: Payer: Self-pay | Admitting: Family Medicine

## 2014-10-04 ENCOUNTER — Ambulatory Visit (INDEPENDENT_AMBULATORY_CARE_PROVIDER_SITE_OTHER): Payer: Medicare Other | Admitting: Family Medicine

## 2014-10-04 VITALS — BP 136/76 | HR 60 | Temp 98.0°F | Resp 16 | Ht 67.0 in | Wt 124.9 lb

## 2014-10-04 DIAGNOSIS — J309 Allergic rhinitis, unspecified: Secondary | ICD-10-CM | POA: Insufficient documentation

## 2014-10-04 DIAGNOSIS — F411 Generalized anxiety disorder: Secondary | ICD-10-CM | POA: Diagnosis not present

## 2014-10-04 DIAGNOSIS — K219 Gastro-esophageal reflux disease without esophagitis: Secondary | ICD-10-CM | POA: Insufficient documentation

## 2014-10-04 DIAGNOSIS — F419 Anxiety disorder, unspecified: Secondary | ICD-10-CM | POA: Insufficient documentation

## 2014-10-04 MED ORDER — CLONAZEPAM 0.5 MG PO TABS: 0.2500 mg | ORAL_TABLET | Freq: Two times a day (BID) | ORAL | Status: DC | PRN

## 2014-10-04 NOTE — Progress Notes (Signed)
Name: [name]   MRN: [id number]    DOB: Sep 20, 1950   Date:10/04/2014       Progress Note  Subjective  Chief Complaint  Chief Complaint  Patient presents with  . Anxiety    patient is still taking the medication as directed. patient stated that he feels it has helped.  . Medication Refill    if Dr. Manuella Ghazi feels he needs to continue with this medicaiton    Anxiety Presents for follow-up visit. Symptoms include excessive worry, insomnia, nervous/anxious behavior and restlessness. Patient reports no chest pain, depressed mood, irritability or panic.   Past treatments include benzodiazephines. The treatment provided moderate relief. Compliance with prior treatments has been good.  symptoms of anxiety have improved with lorazepam. Patient is not experiencing symptoms of alcohol withdrawal anymore. He wishes to continue on an as- needed anxiolytic.   Past Medical History  Diagnosis Date  . Chronic pain   . Cancer     Colon  . Insomnia   . Arthritis   . Abnormal laboratory test     Sees Dr. Ma Hillock  . Acid reflux   . Seasonal allergies     Past Surgical History  Procedure Laterality Date  . Spinal cord stimulator implant    . Sacroiliac joint fusion    . Lumbar fusion      L4-L5, L5-S1  . Colon surgery      resection, 2nd surgery for scar tissue removal  . Shoulder surgery Left     History reviewed. No pertinent family history.  Social History   Social History  . Marital Status: Married    Spouse Name: N/A  . Number of Children: N/A  . Years of Education: N/A   Occupational History  . Not on file.   Social History Main Topics  . Smoking status: Former Research scientist (life sciences)  . Smokeless tobacco: Never Used  . Alcohol Use: No     Comment: Stopped tuesday.   . Drug Use: No  . Sexual Activity: Not on file   Other Topics Concern  . Not on file   Social History Narrative     Current outpatient prescriptions:  .  celecoxib (CELEBREX) 200 MG capsule, Take 200 mg by mouth  daily., Disp: , Rfl: 5 .  Cholecalciferol (VITAMIN D) 2000 UNITS CAPS, Take 1 capsule by mouth daily., Disp: , Rfl:  .  clonazePAM (KLONOPIN) 0.5 MG tablet, Take 0.5 tablets (0.25 mg total) by mouth 2 (two) times daily as needed for anxiety., Disp: 60 tablet, Rfl: 0 .  esomeprazole (NEXIUM 24HR) 20 MG capsule, Take by mouth., Disp: , Rfl:  .  fluticasone (FLONASE) 50 MCG/ACT nasal spray, Place into the nose., Disp: , Rfl:  .  Multiple Vitamins-Minerals (CENTRUM SILVER ULTRA MENS) TABS, Take 1 tablet by mouth daily., Disp: , Rfl:  .  traZODone (DESYREL) 100 MG tablet, Take 1 tablet (100 mg total) by mouth at bedtime., Disp: 90 tablet, Rfl: 0  Allergies  Allergen Reactions  . Bee Venom Swelling    Bees     Review of Systems  Constitutional: Negative for irritability.  Cardiovascular: Negative for chest pain.  Psychiatric/Behavioral: The patient is nervous/anxious and has insomnia.     Objective  Filed Vitals:   10/04/14 1005  BP: 136/76  Pulse: 60  Temp: 98 F (36.7 C)  TempSrc: Oral  Resp: 16  Height: 5\' 7"  (1.702 m)  Weight: 124 lb 14.4 oz (56.654 kg)  SpO2: 98%    Physical Exam  Constitutional: He is oriented to person, place, and time and well-developed, well-nourished, and in no distress.  Cardiovascular: Normal rate and regular rhythm.   Pulmonary/Chest: Effort normal and breath sounds normal.  Neurological: He is alert and oriented to person, place, and time.  Skin: Skin is warm and dry.  Psychiatric: Affect and judgment normal.  Nursing note and vitals reviewed.   Assessment & Plan  1. Generalized anxiety disorder  DC lorazepam and start patient on a long-acting benzodiazepine for relief of his symptoms of anxiety. Patient educated on the dependence potential of clonazepam. He was asked to take the medication only as needed and to report any adverse reactions to this provider right away. Follow-up in one month.  - clonazePAM (KLONOPIN) 0.5 MG tablet; Take 0.5  tablets (0.25 mg total) by mouth 2 (two) times daily as needed for anxiety.  Dispense: 60 tablet; Refill: 0   Jamas Jaquay [facility] 10/04/2014 6:17 PM

## 2014-11-04 ENCOUNTER — Ambulatory Visit (INDEPENDENT_AMBULATORY_CARE_PROVIDER_SITE_OTHER): Payer: Medicare Other | Admitting: Family Medicine

## 2014-11-04 ENCOUNTER — Encounter: Payer: Self-pay | Admitting: Family Medicine

## 2014-11-04 VITALS — BP 136/77 | HR 68 | Temp 98.4°F | Resp 18 | Ht 67.0 in | Wt 127.8 lb

## 2014-11-04 DIAGNOSIS — F411 Generalized anxiety disorder: Secondary | ICD-10-CM | POA: Diagnosis not present

## 2014-11-04 DIAGNOSIS — Z23 Encounter for immunization: Secondary | ICD-10-CM

## 2014-11-04 MED ORDER — CLONAZEPAM 0.5 MG PO TABS: 0.2500 mg | ORAL_TABLET | Freq: Two times a day (BID) | ORAL | Status: DC | PRN

## 2014-11-04 NOTE — Progress Notes (Signed)
Name: [name]   MRN: [id number]    DOB: [birth year]   Date:11/04/2014       Progress Note  Subjective  Chief Complaint  Chief Complaint  Patient presents with  . Follow-up    1 mo  . Medication Refill    Clonazepam 0.25mg     Anxiety Presents for follow-up visit. Symptoms include excessive worry and nervous/anxious behavior. Patient reports no panic. The symptoms are aggravated by family issues.   Past treatments include benzodiazephines. The treatment provided significant relief. Compliance with prior treatments has been good.   Past Medical History  Diagnosis Date  . Chronic pain   . Cancer     Colon  . Insomnia   . Arthritis   . Abnormal laboratory test     Sees Dr. Ma Hillock  . Acid reflux   . Seasonal allergies     Past Surgical History  Procedure Laterality Date  . Spinal cord stimulator implant    . Sacroiliac joint fusion    . Lumbar fusion      L4-L5, L5-S1  . Colon surgery      resection, 2nd surgery for scar tissue removal  . Shoulder surgery Left     History reviewed. No pertinent family history.  Social History   Social History  . Marital Status: Married    Spouse Name: N/A  . Number of Children: N/A  . Years of Education: N/A   Occupational History  . Not on file.   Social History Main Topics  . Smoking status: Former Research scientist (life sciences)  . Smokeless tobacco: Never Used  . Alcohol Use: No     Comment: Stopped tuesday.   . Drug Use: No  . Sexual Activity: Not on file   Other Topics Concern  . Not on file   Social History Narrative     Current outpatient prescriptions:  .  celecoxib (CELEBREX) 200 MG capsule, Take 200 mg by mouth daily., Disp: , Rfl: 5 .  Cholecalciferol (VITAMIN D) 2000 UNITS CAPS, Take 1 capsule by mouth daily., Disp: , Rfl:  .  clonazePAM (KLONOPIN) 0.5 MG tablet, Take 0.5 tablets (0.25 mg total) by mouth 2 (two) times daily as needed for anxiety., Disp: 60 tablet, Rfl: 0 .  esomeprazole (NEXIUM 24HR) 20 MG capsule, Take by  mouth., Disp: , Rfl:  .  fluticasone (FLONASE) 50 MCG/ACT nasal spray, Place into the nose., Disp: , Rfl:  .  Multiple Vitamins-Minerals (CENTRUM SILVER ULTRA MENS) TABS, Take 1 tablet by mouth daily., Disp: , Rfl:  .  traZODone (DESYREL) 100 MG tablet, Take 1 tablet (100 mg total) by mouth at bedtime., Disp: 90 tablet, Rfl: 0  Allergies  Allergen Reactions  . Bee Venom Swelling    Bees    Review of Systems  Psychiatric/Behavioral: Negative for depression. The patient is nervous/anxious.    Objective  Filed Vitals:   11/04/14 1016  BP: 136/77  Pulse: 68  Temp: 98.4 F (36.9 C)  TempSrc: Oral  Resp: 18  Height: 5\' 7"  (1.702 m)  Weight: 127 lb 12.8 oz (57.97 kg)  SpO2: 96%    Physical Exam  Constitutional: He is oriented to person, place, and time and well-developed, well-nourished, and in no distress.  Cardiovascular: Normal rate and regular rhythm.   Pulmonary/Chest: Effort normal and breath sounds normal.  Neurological: He is alert and oriented to person, place, and time.  Psychiatric: Memory, affect and judgment normal.  Nursing note and vitals reviewed.   Assessment & Plan  1. Need for immunization against influenza  - Flu vaccine HIGH DOSE PF (Fluzone High dose)  2. Generalized anxiety disorder Symptoms of anxiety are responsive to current therapy. Refills provided. - clonazePAM (KLONOPIN) 0.5 MG tablet; Take 0.5 tablets (0.25 mg total) by mouth 2 (two) times daily as needed for anxiety.  Dispense: 60 tablet; Refill: 2    Abou Sterkel Asad A. [facility] 11/04/2014 10:24 AM

## 2014-12-02 ENCOUNTER — Other Ambulatory Visit: Payer: Self-pay | Admitting: Family Medicine

## 2014-12-29 ENCOUNTER — Other Ambulatory Visit: Payer: Self-pay | Admitting: *Deleted

## 2014-12-29 DIAGNOSIS — C9001 Multiple myeloma in remission: Secondary | ICD-10-CM

## 2015-01-03 ENCOUNTER — Encounter: Payer: Self-pay | Admitting: *Deleted

## 2015-01-03 ENCOUNTER — Inpatient Hospital Stay: Payer: Medicare Other | Attending: Internal Medicine

## 2015-01-03 ENCOUNTER — Inpatient Hospital Stay (HOSPITAL_BASED_OUTPATIENT_CLINIC_OR_DEPARTMENT_OTHER): Payer: Medicare Other | Admitting: Internal Medicine

## 2015-01-03 ENCOUNTER — Ambulatory Visit: Payer: Self-pay | Admitting: Internal Medicine

## 2015-01-03 VITALS — BP 135/87 | HR 70 | Temp 98.2°F | Wt 128.5 lb

## 2015-01-03 DIAGNOSIS — Z87891 Personal history of nicotine dependence: Secondary | ICD-10-CM | POA: Insufficient documentation

## 2015-01-03 DIAGNOSIS — Z79899 Other long term (current) drug therapy: Secondary | ICD-10-CM | POA: Diagnosis not present

## 2015-01-03 DIAGNOSIS — Z9049 Acquired absence of other specified parts of digestive tract: Secondary | ICD-10-CM | POA: Insufficient documentation

## 2015-01-03 DIAGNOSIS — M549 Dorsalgia, unspecified: Secondary | ICD-10-CM | POA: Insufficient documentation

## 2015-01-03 DIAGNOSIS — C9000 Multiple myeloma not having achieved remission: Secondary | ICD-10-CM

## 2015-01-03 DIAGNOSIS — Z85038 Personal history of other malignant neoplasm of large intestine: Secondary | ICD-10-CM | POA: Diagnosis not present

## 2015-01-03 DIAGNOSIS — F329 Major depressive disorder, single episode, unspecified: Secondary | ICD-10-CM | POA: Insufficient documentation

## 2015-01-03 DIAGNOSIS — K219 Gastro-esophageal reflux disease without esophagitis: Secondary | ICD-10-CM | POA: Insufficient documentation

## 2015-01-03 DIAGNOSIS — D472 Monoclonal gammopathy: Secondary | ICD-10-CM

## 2015-01-03 DIAGNOSIS — C9001 Multiple myeloma in remission: Secondary | ICD-10-CM

## 2015-01-03 LAB — CREATININE, SERUM
CREATININE: 1.17 mg/dL (ref 0.61–1.24)
GFR calc Af Amer: >60 mL/min (ref >60)
GFR calc non Af Amer: >60 mL/min (ref >60)

## 2015-01-03 LAB — CALCIUM: Calcium: 9 mg/dL (ref 8.9–10.3)

## 2015-01-03 LAB — CBC WITH DIFFERENTIAL/PLATELET
Basophils Absolute: 0 10*3/uL (ref 0–0.1)
Basophils Relative: 1 %
Eosinophils Absolute: 0.1 10*3/uL (ref 0–0.7)
Eosinophils Relative: 1 %
HEMATOCRIT: 41 % (ref 40.0–52.0)
Hemoglobin: 14 g/dL (ref 13.0–18.0)
LYMPHS PCT: 18 %
Lymphs Abs: 1.6 10*3/uL (ref 1.0–3.6)
MCH: 32 pg (ref 26.0–34.0)
MCHC: 34.1 g/dL (ref 32.0–36.0)
MCV: 93.7 fL (ref 80.0–100.0)
MONO ABS: 0.6 10*3/uL (ref 0.2–1.0)
MONOS PCT: 7 %
NEUTROS ABS: 6.3 10*3/uL (ref 1.4–6.5)
Neutrophils Relative %: 73 %
Platelets: 324 10*3/uL (ref 150–440)
RBC: 4.38 MIL/uL — ABNORMAL LOW (ref 4.40–5.90)
RDW: 12.7 % (ref 11.5–14.5)
WBC: 8.5 10*3/uL (ref 3.8–10.6)

## 2015-01-03 NOTE — Progress Notes (Signed)
[facility] OFFICE PROGRESS NOTE  Patient Care Team: Roselee Nova, MD as PCP - General (Family Medicine)   SUMMARY OF ONCOLOGIC HISTORY:  # NOV 2013- SMOLDERING MULTIPLE MYELOMA [IgA Kappa; BMBx- 10-12% plasma cell;Dr.Trillo Frisco]  # 2001- COLON CA STAGE III [s/p chemo; [place]]  INTERVAL HISTORY:  This is my first interaction with the patient since I joined the practice September 2016. I reviewed the patient's prior charts/pertinent labs/imaging in detail; findings are summarized above.   A very pleasant 64 year old male patient with above history of smoldering myeloma currently being monitored is here for follow-up. Patient continues to have chronic back pain/arthritic pain for which he takes Celebrex/ Advil every day.  He denies any worsening of his back pain. Denies any unusual tingling or numbness of extremities. Denies any significant weight loss. Denies any extreme tiredness. He is fairly active.   REVIEW OF SYSTEMS:  A complete 10 point review of system is done which is negative except mentioned above/history of present illness.   PAST MEDICAL HISTORY :  Past Medical History  Diagnosis Date  . Chronic back pain   . Colon cancer ([place]) 11/1999    stage 3 s/p colon resection  . Insomnia   . Arthritis   . Abnormal laboratory test     Sees Dr. Ma Hillock  . Acid reflux   . Seasonal allergies   . Multiple myeloma ([place]) 12/2011    smoldering vs mgus  . History of chemotherapy     5-FU pump/leukovorin  . Osteoporosis   . Depression     PAST SURGICAL HISTORY :   Past Surgical History  Procedure Laterality Date  . Spinal cord stimulator implant    . Sacroiliac joint fusion    . Lumbar fusion      L4-L5, L5-S1  . Colon surgery      resection, 2nd surgery for scar tissue removal  . Shoulder surgery Left   . Esophagogastroduodenoscopy  10/2010  . Colonoscopy      FAMILY HISTORY :   Family History  Problem Relation Age of Onset  . Lymphoma  Mother   . Lung cancer Father   . Heart disease      grandparents    SOCIAL HISTORY:   Social History  Substance Use Topics  . Smoking status: Former Research scientist (life sciences)  . Smokeless tobacco: Never Used  . Alcohol Use: No     Comment: Stopped tuesday.     ALLERGIES:  is allergic to bee venom.  MEDICATIONS:  Current Outpatient Prescriptions  Medication Sig Dispense Refill  . celecoxib (CELEBREX) 200 MG capsule Take 200 mg by mouth daily.  5  . Cholecalciferol (VITAMIN D) 2000 UNITS CAPS Take 1 capsule by mouth daily.    . clonazePAM (KLONOPIN) 0.5 MG tablet Take 0.5 tablets (0.25 mg total) by mouth 2 (two) times daily as needed for anxiety. 60 tablet 2  . esomeprazole (NEXIUM 24HR) 20 MG capsule Take by mouth.    . fluticasone (FLONASE) 50 MCG/ACT nasal spray Place into the nose.    . Multiple Vitamins-Minerals (CENTRUM SILVER ULTRA MENS) TABS Take 1 tablet by mouth daily.    . traZODone (DESYREL) 100 MG tablet TAKE 1 TABLET BY MOUTH AT BEDTIME 90 tablet 0   No current facility-administered medications for this visit.    PHYSICAL EXAMINATION:  BP 135/87 mmHg  Pulse 70  Temp(Src) 98.2 F (36.8 C) (Oral)  Wt 128 lb 8.5 oz (58.3 kg)  Filed Weights   01/03/15  1451  Weight: 128 lb 8.5 oz (58.3 kg)    GENERAL: Well-nourished well-developed; Alert, no distress and comfortable. Accompanied by his wife.  EYES: no pallor or icterus OROPHARYNX: no thrush or ulceration; good dentition  NECK: supple, no masses felt LYMPH:  no palpable lymphadenopathy in the cervical, axillary or inguinal regions LUNGS: clear to auscultation and  No wheeze or crackles HEART/CVS: regular rate & rhythm and no murmurs; No lower extremity edema ABDOMEN:abdomen soft, non-tender and normal bowel sounds Musculoskeletal:no cyanosis of digits and no clubbing  PSYCH: alert & oriented x 3 with fluent speech NEURO: no focal motor/sensory deficits SKIN:  no rashes or significant lesions  LABORATORY DATA:  I have  reviewed the data as listed    Component Value Date/Time   CREATININE 1.17 01/03/2015 1430   CREATININE 1.34* 12/28/2013 1428   CALCIUM 9.0 01/03/2015 1430   CALCIUM 9.6 12/28/2013 1428   PROT 6.7 06/27/2012 1136   ALBUMIN 3.5 06/27/2012 1136   AST 17 06/27/2012 1136   ALT 22 06/27/2012 1136   ALKPHOS 91 06/27/2012 1136   BILITOT 0.3 06/27/2012 1136   GFRNONAA >60 01/03/2015 1430   GFRNONAA 57* 12/28/2013 1428   GFRNONAA >60 06/25/2013 1413   GFRAA >60 01/03/2015 1430   GFRAA >60 12/28/2013 1428   GFRAA >60 06/25/2013 1413    No results found for: SPEP, UPEP  Lab Results  Component Value Date   WBC 8.5 01/03/2015   NEUTROABS 6.3 01/03/2015   HGB 14.0 01/03/2015   HCT 41.0 01/03/2015   MCV 93.7 01/03/2015   PLT 324 01/03/2015      Chemistry      Component Value Date/Time   CREATININE 1.17 01/03/2015 1430   CREATININE 1.34* 12/28/2013 1428      Component Value Date/Time   CALCIUM 9.0 01/03/2015 1430   CALCIUM 9.6 12/28/2013 1428   ALKPHOS 91 06/27/2012 1136   AST 17 06/27/2012 1136   ALT 22 06/27/2012 1136   BILITOT 0.3 06/27/2012 1136       RADIOGRAPHIC STUDIES: I have personally reviewed the radiological images as listed and agreed with the findings in the report. No results found.   ASSESSMENT & PLAN:   # SMOLDERING MULTIPLE MYELOMA [2013 s/p BMBx]. Clinically, patient does not seem to have progressed into active myeloma. CBC/creatinine calcium within normal limits. Myeloma numbers from today are pending.  # at a long discussion with the patient regarding the natural history of smoldering myeloma/MGUS etc. We'll continue to follow up every 6 months with M protein/kappa lambda light chain/ CBC CMP.  # history of arthritis- recommend cutting down on NSAIDs; and in place try to take Tylenol.  All questions were answered. The patient knows to call the clinic with any problems, questions or concerns. No barriers to learning was detected.  25 minutes  face-to-face with the patient and family; more than 50% time on counseling and coordination of care.    Cammie Sickle, MD 01/03/2015 3:05 PM

## 2015-01-03 NOTE — Progress Notes (Signed)
No complaints today.

## 2015-01-04 LAB — KAPPA/LAMBDA LIGHT CHAINS
KAPPA FREE LGHT CHN: 105.74 mg/L — AB (ref 3.30–19.40)
Kappa, lambda light chain ratio: 7.99 — ABNORMAL HIGH (ref 0.26–1.65)
Lambda free light chains: 13.23 mg/L (ref 5.71–26.30)

## 2015-01-05 ENCOUNTER — Telehealth: Payer: Self-pay | Admitting: Internal Medicine

## 2015-01-05 LAB — PROTEIN ELECTROPHORESIS, SERUM
A/G Ratio: 1.7 (ref 0.7–1.7)
ALBUMIN ELP: 3.8 g/dL (ref 2.9–4.4)
ALPHA-1-GLOBULIN: 0.1 g/dL (ref 0.0–0.4)
ALPHA-2-GLOBULIN: 0.5 g/dL (ref 0.4–1.0)
Beta Globulin: 0.8 g/dL (ref 0.7–1.3)
GAMMA GLOBULIN: 0.8 g/dL (ref 0.4–1.8)
Globulin, Total: 2.2 g/dL (ref 2.2–3.9)
M-Spike, %: 0.3 g/dL — ABNORMAL HIGH
TOTAL PROTEIN ELP: 6 g/dL (ref 6.0–8.5)

## 2015-01-05 NOTE — Telephone Encounter (Signed)
Please inform the patient that  kappa lambda light chain ratio-  Stable;  Still awaiting on protein electrophoresis. Dr.B

## 2015-01-10 NOTE — Telephone Encounter (Signed)
Patient informed that labs were stable.

## 2015-02-04 ENCOUNTER — Ambulatory Visit: Payer: Medicare Other | Admitting: Family Medicine

## 2015-02-10 ENCOUNTER — Ambulatory Visit: Payer: Medicare Other | Admitting: Family Medicine

## 2015-02-16 ENCOUNTER — Encounter: Payer: Self-pay | Admitting: Family Medicine

## 2015-02-16 ENCOUNTER — Ambulatory Visit (INDEPENDENT_AMBULATORY_CARE_PROVIDER_SITE_OTHER): Payer: Medicare Other | Admitting: Family Medicine

## 2015-02-16 VITALS — BP 133/70 | HR 78 | Temp 98.8°F | Resp 17 | Ht 67.0 in | Wt 131.2 lb

## 2015-02-16 DIAGNOSIS — F411 Generalized anxiety disorder: Secondary | ICD-10-CM

## 2015-02-16 DIAGNOSIS — R739 Hyperglycemia, unspecified: Secondary | ICD-10-CM | POA: Diagnosis not present

## 2015-02-16 NOTE — Progress Notes (Signed)
Name: [name]   MRN: [id number]    DOB: 06-Jul-1950   Date:02/16/2015       Progress Note  Subjective  Chief Complaint  Chief Complaint  Patient presents with  . Follow-up    3 mo  . Anxiety  . Gastroesophageal Reflux    Anxiety Presents for follow-up visit. The problem has been unchanged. Symptoms include excessive worry, insomnia (takes Trazodone at night) and nervous/anxious behavior. Patient reports no depressed mood, panic or shortness of breath. Primary symptoms comment: 'I have Obsessive Compulsive Disorder'. The quality of sleep is good.   His past medical history is significant for depression. There is no history of anxiety/panic attacks. Past treatments include benzodiazephines. The treatment provided moderate relief. Compliance with prior treatments has been good.    Past Medical History  Diagnosis Date  . Chronic back pain   . Colon cancer ([place]) 11/1999    stage 3 s/p colon resection  . Insomnia   . Arthritis   . Abnormal laboratory test     Sees Dr. Ma Hillock  . Acid reflux   . Seasonal allergies   . Multiple myeloma ([place]) 12/2011    smoldering vs mgus  . History of chemotherapy     5-FU pump/leukovorin  . Osteoporosis   . Depression     Past Surgical History  Procedure Laterality Date  . Spinal cord stimulator implant    . Sacroiliac joint fusion    . Lumbar fusion      L4-L5, L5-S1  . Colon surgery      resection, 2nd surgery for scar tissue removal  . Shoulder surgery Left   . Esophagogastroduodenoscopy  10/2010  . Colonoscopy      Family History  Problem Relation Age of Onset  . Lymphoma Mother   . Lung cancer Father   . Heart disease      grandparents    Social History   Social History  . Marital Status: Married    Spouse Name: N/A  . Number of Children: N/A  . Years of Education: N/A   Occupational History  . Not on file.   Social History Main Topics  . Smoking status: Former Research scientist (life sciences)  . Smokeless tobacco: Never Used  . Alcohol  Use: No     Comment: Stopped tuesday.   . Drug Use: No  . Sexual Activity: Not on file   Other Topics Concern  . Not on file   Social History Narrative     Current outpatient prescriptions:  .  celecoxib (CELEBREX) 200 MG capsule, Take 200 mg by mouth daily., Disp: , Rfl: 5 .  Cholecalciferol (VITAMIN D) 2000 UNITS CAPS, Take 1 capsule by mouth daily., Disp: , Rfl:  .  clonazePAM (KLONOPIN) 0.5 MG tablet, Take 0.5 tablets (0.25 mg total) by mouth 2 (two) times daily as needed for anxiety., Disp: 60 tablet, Rfl: 2 .  esomeprazole (NEXIUM 24HR) 20 MG capsule, Take by mouth., Disp: , Rfl:  .  fluticasone (FLONASE) 50 MCG/ACT nasal spray, Place into the nose., Disp: , Rfl:  .  Multiple Vitamins-Minerals (CENTRUM SILVER ULTRA MENS) TABS, Take 1 tablet by mouth daily., Disp: , Rfl:  .  traZODone (DESYREL) 100 MG tablet, TAKE 1 TABLET BY MOUTH AT BEDTIME, Disp: 90 tablet, Rfl: 0  Allergies  Allergen Reactions  . Bee Venom Swelling    Bees    Review of Systems  Respiratory: Negative for shortness of breath.   Psychiatric/Behavioral: Positive for depression. The patient is nervous/anxious  and has insomnia (takes Trazodone at night).     Objective  Filed Vitals:   02/16/15 1342  BP: 133/70  Pulse: 78  Temp: 98.8 F (37.1 C)  TempSrc: Oral  Resp: 17  Height: _0  (1.702 m)  Weight: 131 lb 3.2 oz (59.512 kg)  SpO2: 98%    Physical Exam  Constitutional: He is oriented to person, place, and time and well-developed, well-nourished, and in no distress.  HENT:  Head: Normocephalic and atraumatic.  Cardiovascular: Normal rate, regular rhythm and normal heart sounds.   Pulmonary/Chest: Effort normal and breath sounds normal.  Neurological: He is alert and oriented to person, place, and time.  Psychiatric: Mood, memory, affect and judgment normal.  Nursing note and vitals reviewed.     Assessment & Plan  1. Generalized anxiety disorder Symptoms stable on as needed  benzodiazepine therapy. No side effects. Continue present management  2. Hyperglycemia  - POCT HgB A1C   [name] Asad A. [facility] 02/16/2015 1:50 PM

## 2015-02-28 ENCOUNTER — Other Ambulatory Visit: Payer: Self-pay | Admitting: Family Medicine

## 2015-04-26 ENCOUNTER — Other Ambulatory Visit: Payer: Self-pay | Admitting: Family Medicine

## 2015-05-18 ENCOUNTER — Ambulatory Visit (INDEPENDENT_AMBULATORY_CARE_PROVIDER_SITE_OTHER): Payer: Medicare Other | Admitting: Family Medicine

## 2015-05-18 ENCOUNTER — Encounter: Payer: Self-pay | Admitting: Family Medicine

## 2015-05-18 VITALS — BP 132/68 | HR 69 | Temp 98.9°F | Resp 16 | Ht 67.0 in | Wt 125.3 lb

## 2015-05-18 DIAGNOSIS — M1990 Unspecified osteoarthritis, unspecified site: Secondary | ICD-10-CM | POA: Diagnosis not present

## 2015-05-18 MED ORDER — CELECOXIB 200 MG PO CAPS: 200.0000 mg | ORAL_CAPSULE | Freq: Every day | ORAL | Status: DC

## 2015-05-18 NOTE — Progress Notes (Signed)
Name: [name]   MRN: [id number]    DOB: December 07, 1950   Date:05/18/2015       Progress Note  Subjective  Chief Complaint  Chief Complaint  Patient presents with  . Follow-up    3 mo  . Medication Refill    celebrex 200 mg    HPI  Arthritis in Hands: Pt. Presents for refill of Celebrex. Has history of arthritis in both thumbs, was being seen by Dr. Tamala Julian (orthopedics) who is now retired. Pt. Takes Celebrex 200 mg daily, which helps relieve the pain and swelling. It also helps arthritis in his neck.He is requesting a refill. Records from orthopedist not available   Past Medical History  Diagnosis Date  . Chronic back pain   . Colon cancer ([place]) 11/1999    stage 3 s/p colon resection  . Insomnia   . Arthritis   . Abnormal laboratory test     Sees Dr. Ma Hillock  . Acid reflux   . Seasonal allergies   . Multiple myeloma ([place]) 12/2011    smoldering vs mgus  . History of chemotherapy     5-FU pump/leukovorin  . Osteoporosis   . Depression     Past Surgical History  Procedure Laterality Date  . Spinal cord stimulator implant    . Sacroiliac joint fusion    . Lumbar fusion      L4-L5, L5-S1  . Colon surgery      resection, 2nd surgery for scar tissue removal  . Shoulder surgery Left   . Esophagogastroduodenoscopy  10/2010  . Colonoscopy      Family History  Problem Relation Age of Onset  . Lymphoma Mother   . Lung cancer Father   . Heart disease      grandparents    Social History   Social History  . Marital Status: Married    Spouse Name: N/A  . Number of Children: N/A  . Years of Education: N/A   Occupational History  . Not on file.   Social History Main Topics  . Smoking status: Former Research scientist (life sciences)  . Smokeless tobacco: Never Used  . Alcohol Use: No     Comment: Stopped tuesday.   . Drug Use: No  . Sexual Activity: Not on file   Other Topics Concern  . Not on file   Social History Narrative     Current outpatient prescriptions:  .  celecoxib  (CELEBREX) 200 MG capsule, Take 200 mg by mouth daily., Disp: , Rfl: 5 .  Cholecalciferol (VITAMIN D) 2000 UNITS CAPS, Take 1 capsule by mouth daily., Disp: , Rfl:  .  clonazePAM (KLONOPIN) 0.5 MG tablet, TAKE ONE-HALF TABLET BY MOUTH TWICE DAILY AS NEEDED FOR ANXIETY, Disp: 60 tablet, Rfl: 0 .  esomeprazole (NEXIUM 24HR) 20 MG capsule, Take by mouth., Disp: , Rfl:  .  fluticasone (FLONASE) 50 MCG/ACT nasal spray, Place into the nose., Disp: , Rfl:  .  Multiple Vitamins-Minerals (CENTRUM SILVER ULTRA MENS) TABS, Take 1 tablet by mouth daily., Disp: , Rfl:  .  traZODone (DESYREL) 100 MG tablet, TAKE 1 TABLET BY MOUTH AT BEDTIME, Disp: 90 tablet, Rfl: 0  Allergies  Allergen Reactions  . Bee Venom Swelling    Bees     Review of Systems  Musculoskeletal: Positive for joint pain and neck pain.     Objective  Filed Vitals:   05/18/15 1337  BP: 132/68  Pulse: 69  Temp: 98.9 F (37.2 C)  TempSrc: Oral  Resp: 16  Height: 5' 7" (1.702 m)  Weight: 125 lb 4.8 oz (56.836 kg)  SpO2: 98%    Physical Exam  Constitutional: He is oriented to person, place, and time and well-developed, well-nourished, and in no distress.  Cardiovascular: Normal rate and regular rhythm.   Pulmonary/Chest: Effort normal and breath sounds normal.  Musculoskeletal:       Right hand: He exhibits tenderness.       Left hand: He exhibits tenderness.       Hands: Tenderness to palpation over the base of left and right thumb, normal ROM,  Neurological: He is alert and oriented to person, place, and time.  Nursing note and vitals reviewed.     Assessment & Plan  1. Arthritis Refill provided. We'll obtain records from orthopedist's office. - celecoxib (CELEBREX) 200 MG capsule; Take 1 capsule (200 mg total) by mouth daily.  Dispense: 30 capsule; Refill: 2   Gabrial Domine Asad A. [facility] 05/18/2015 1:46 PM

## 2015-06-28 ENCOUNTER — Other Ambulatory Visit: Payer: Self-pay | Admitting: Family Medicine

## 2015-07-06 ENCOUNTER — Other Ambulatory Visit: Payer: Medicare Other

## 2015-07-06 ENCOUNTER — Ambulatory Visit: Payer: Medicare Other | Admitting: Internal Medicine

## 2015-07-11 ENCOUNTER — Inpatient Hospital Stay (HOSPITAL_BASED_OUTPATIENT_CLINIC_OR_DEPARTMENT_OTHER): Payer: Medicare Other | Admitting: Internal Medicine

## 2015-07-11 ENCOUNTER — Inpatient Hospital Stay: Payer: Medicare Other | Attending: Internal Medicine

## 2015-07-11 VITALS — BP 135/87 | HR 51 | Temp 98.1°F | Resp 18 | Wt 125.9 lb

## 2015-07-11 DIAGNOSIS — Z87891 Personal history of nicotine dependence: Secondary | ICD-10-CM | POA: Insufficient documentation

## 2015-07-11 DIAGNOSIS — C9000 Multiple myeloma not having achieved remission: Secondary | ICD-10-CM | POA: Diagnosis not present

## 2015-07-11 DIAGNOSIS — K219 Gastro-esophageal reflux disease without esophagitis: Secondary | ICD-10-CM | POA: Diagnosis not present

## 2015-07-11 DIAGNOSIS — F329 Major depressive disorder, single episode, unspecified: Secondary | ICD-10-CM | POA: Insufficient documentation

## 2015-07-11 DIAGNOSIS — M1990 Unspecified osteoarthritis, unspecified site: Secondary | ICD-10-CM | POA: Insufficient documentation

## 2015-07-11 DIAGNOSIS — D472 Monoclonal gammopathy: Secondary | ICD-10-CM

## 2015-07-11 DIAGNOSIS — Z85038 Personal history of other malignant neoplasm of large intestine: Secondary | ICD-10-CM | POA: Diagnosis not present

## 2015-07-11 DIAGNOSIS — Z79899 Other long term (current) drug therapy: Secondary | ICD-10-CM

## 2015-07-11 LAB — COMPREHENSIVE METABOLIC PANEL
ALBUMIN: 4.5 g/dL (ref 3.5–5.0)
ALT: 14 U/L — ABNORMAL LOW (ref 17–63)
ANION GAP: 4 — AB (ref 5–15)
AST: 22 U/L (ref 15–41)
Alkaline Phosphatase: 52 U/L (ref 38–126)
BUN: 23 mg/dL — AB (ref 6–20)
CO2: 31 mmol/L (ref 22–32)
Calcium: 9.3 mg/dL (ref 8.9–10.3)
Chloride: 101 mmol/L (ref 101–111)
Creatinine, Ser: 1.08 mg/dL (ref 0.61–1.24)
GFR calc Af Amer: >60 mL/min (ref >60)
GFR calc non Af Amer: >60 mL/min (ref >60)
GLUCOSE: 99 mg/dL (ref 65–99)
POTASSIUM: 4.4 mmol/L (ref 3.5–5.1)
SODIUM: 136 mmol/L (ref 135–145)
Total Bilirubin: 0.9 mg/dL (ref 0.3–1.2)
Total Protein: 7 g/dL (ref 6.5–8.1)

## 2015-07-11 LAB — CBC WITH DIFFERENTIAL/PLATELET
BASOS ABS: 0 10*3/uL (ref 0–0.1)
BASOS PCT: 1 %
EOS ABS: 0.1 10*3/uL (ref 0–0.7)
Eosinophils Relative: 2 %
HCT: 42 % (ref 40.0–52.0)
HEMOGLOBIN: 14.6 g/dL (ref 13.0–18.0)
Lymphocytes Relative: 34 %
Lymphs Abs: 2.1 10*3/uL (ref 1.0–3.6)
MCH: 32.5 pg (ref 26.0–34.0)
MCHC: 34.8 g/dL (ref 32.0–36.0)
MCV: 93.3 fL (ref 80.0–100.0)
MONO ABS: 0.6 10*3/uL (ref 0.2–1.0)
MONOS PCT: 9 %
NEUTROS ABS: 3.3 10*3/uL (ref 1.4–6.5)
NEUTROS PCT: 54 %
Platelets: 321 10*3/uL (ref 150–440)
RBC: 4.51 MIL/uL (ref 4.40–5.90)
RDW: 12.4 % (ref 11.5–14.5)
WBC: 6 10*3/uL (ref 3.8–10.6)

## 2015-07-11 LAB — LACTATE DEHYDROGENASE: LDH: 139 U/L (ref 98–192)

## 2015-07-11 NOTE — Progress Notes (Signed)
[facility] OFFICE PROGRESS NOTE  Patient Care Team: Roselee Nova, MD as PCP - General (Family Medicine)   SUMMARY OF ONCOLOGIC HISTORY:  # NOV 2013- SMOLDERING MULTIPLE MYELOMA [IgA Kappa; BMBx- 10-12% plasma cell;Dr.Trillo Frisco]  # 2001- COLON CA STAGE III [s/p chemo; [place]]  INTERVAL HISTORY:  A very pleasant 65 year old male patient with above history of smoldering myeloma currently being monitored is here for follow-up.   Patient continues to have chronic back pain/arthritic pain- this is not any worse. He is on NSAIDs.  He denies any worsening of his back pain. Denies any unusual tingling or numbness of extremities. Denies any significant weight loss.He is fairly active-golfing.    REVIEW OF SYSTEMS:  A complete 10 point review of system is done which is negative except mentioned above/history of present illness.   PAST MEDICAL HISTORY :  Past Medical History  Diagnosis Date  . Chronic back pain   . Colon cancer ([place]) 11/1999    stage 3 s/p colon resection  . Insomnia   . Arthritis   . Abnormal laboratory test     Sees Dr. Ma Hillock  . Acid reflux   . Seasonal allergies   . Multiple myeloma ([place]) 12/2011    smoldering vs mgus  . History of chemotherapy     5-FU pump/leukovorin  . Osteoporosis   . Depression     PAST SURGICAL HISTORY :   Past Surgical History  Procedure Laterality Date  . Spinal cord stimulator implant    . Sacroiliac joint fusion    . Lumbar fusion      L4-L5, L5-S1  . Colon surgery      resection, 2nd surgery for scar tissue removal  . Shoulder surgery Left   . Esophagogastroduodenoscopy  10/2010  . Colonoscopy      FAMILY HISTORY :   Family History  Problem Relation Age of Onset  . Lymphoma Mother   . Lung cancer Father   . Heart disease      grandparents    SOCIAL HISTORY:   Social History  Substance Use Topics  . Smoking status: Former Research scientist (life sciences)  . Smokeless tobacco: Never Used  . Alcohol Use: No   Comment: Stopped tuesday.     ALLERGIES:  is allergic to bee venom.  MEDICATIONS:  Current Outpatient Prescriptions  Medication Sig Dispense Refill  . celecoxib (CELEBREX) 200 MG capsule Take 1 capsule (200 mg total) by mouth daily. 30 capsule 2  . Cholecalciferol (VITAMIN D) 2000 UNITS CAPS Take 1 capsule by mouth daily.    . clonazePAM (KLONOPIN) 0.5 MG tablet TAKE ONE-HALF TABLET BY MOUTH TWICE DAILY AS NEEDED FOR ANXIETY 60 tablet 0  . esomeprazole (NEXIUM 24HR) 20 MG capsule Take by mouth.    . fluticasone (FLONASE) 50 MCG/ACT nasal spray Place into the nose.    . Multiple Vitamins-Minerals (CENTRUM SILVER ULTRA MENS) TABS Take 1 tablet by mouth daily.    . traZODone (DESYREL) 100 MG tablet TAKE 1 TABLET BY MOUTH AT BEDTIME 90 tablet 0   No current facility-administered medications for this visit.    PHYSICAL EXAMINATION:  BP 135/87 mmHg  Pulse 51  Temp(Src) 98.1 F (36.7 C) (Tympanic)  Resp 18  Wt 125 lb 14.1 oz (57.1 kg)  Filed Weights   07/11/15 1144  Weight: 125 lb 14.1 oz (57.1 kg)    GENERAL: Well-nourished well-developed; Alert, no distress and comfortable. Accompanied by his wife.  EYES: no pallor or icterus OROPHARYNX: no  thrush or ulceration; good dentition  NECK: supple, no masses felt LYMPH:  no palpable lymphadenopathy in the cervical, axillary or inguinal regions LUNGS: clear to auscultation and  No wheeze or crackles HEART/CVS: regular rate & rhythm and no murmurs; No lower extremity edema ABDOMEN:abdomen soft, non-tender and normal bowel sounds Musculoskeletal:no cyanosis of digits and no clubbing  PSYCH: alert & oriented x 3 with fluent speech NEURO: no focal motor/sensory deficits SKIN:  no rashes or significant lesions  LABORATORY DATA:  I have reviewed the data as listed    Component Value Date/Time   NA 136 07/11/2015 1130   K 4.4 07/11/2015 1130   CL 101 07/11/2015 1130   CO2 31 07/11/2015 1130   GLUCOSE 99 07/11/2015 1130   BUN 23*  07/11/2015 1130   CREATININE 1.08 07/11/2015 1130   CREATININE 1.34* 12/28/2013 1428   CALCIUM 9.3 07/11/2015 1130   CALCIUM 9.6 12/28/2013 1428   PROT 7.0 07/11/2015 1130   PROT 6.7 06/27/2012 1136   ALBUMIN 4.5 07/11/2015 1130   ALBUMIN 3.5 06/27/2012 1136   AST 22 07/11/2015 1130   AST 17 06/27/2012 1136   ALT 14* 07/11/2015 1130   ALT 22 06/27/2012 1136   ALKPHOS 52 07/11/2015 1130   ALKPHOS 91 06/27/2012 1136   BILITOT 0.9 07/11/2015 1130   BILITOT 0.3 06/27/2012 1136   GFRNONAA >60 07/11/2015 1130   GFRNONAA 57* 12/28/2013 1428   GFRNONAA >60 06/25/2013 1413   GFRAA >60 07/11/2015 1130   GFRAA >60 12/28/2013 1428   GFRAA >60 06/25/2013 1413    No results found for: SPEP, UPEP  Lab Results  Component Value Date   WBC 6.0 07/11/2015   NEUTROABS 3.3 07/11/2015   HGB 14.6 07/11/2015   HCT 42.0 07/11/2015   MCV 93.3 07/11/2015   PLT 321 07/11/2015      Chemistry      Component Value Date/Time   NA 136 07/11/2015 1130   K 4.4 07/11/2015 1130   CL 101 07/11/2015 1130   CO2 31 07/11/2015 1130   BUN 23* 07/11/2015 1130   CREATININE 1.08 07/11/2015 1130   CREATININE 1.34* 12/28/2013 1428      Component Value Date/Time   CALCIUM 9.3 07/11/2015 1130   CALCIUM 9.6 12/28/2013 1428   ALKPHOS 52 07/11/2015 1130   ALKPHOS 91 06/27/2012 1136   AST 22 07/11/2015 1130   AST 17 06/27/2012 1136   ALT 14* 07/11/2015 1130   ALT 22 06/27/2012 1136   BILITOT 0.9 07/11/2015 1130   BILITOT 0.3 06/27/2012 1136       RADIOGRAPHIC STUDIES: I have personally reviewed the radiological images as listed and agreed with the findings in the report. No results found.   ASSESSMENT & PLAN:   # SMOLDERING MULTIPLE MYELOMA [2013 s/p BMBx]. Clinically, patient does not seem to have progressed into active myeloma. CBC/creatinine calcium within normal limits- reviewed the numbers with the patient. M protein October 2016- 0.3 g; slightly abnormal Kappa lambda light chain ratio.  Myeloma  numbers from today are pending.   # We'll continue to follow up every 6 months with M protein/kappa lambda light chain/ CBC CMP. Recommend he have his labs done every week prior to the next visit. He agrees.  # history of arthritis-stable.   Cammie Sickle, MD 07/11/2015 11:57 AM

## 2015-07-12 LAB — KAPPA/LAMBDA LIGHT CHAINS
Kappa free light chain: 113.7 mg/L — ABNORMAL HIGH (ref 3.3–19.4)
Kappa, lambda light chain ratio: 8.18 — ABNORMAL HIGH (ref 0.26–1.65)
Lambda free light chains: 13.9 mg/L (ref 5.7–26.3)

## 2015-07-13 LAB — MULTIPLE MYELOMA PANEL, SERUM
ALBUMIN/GLOB SERPL: 1.6 (ref 0.7–1.7)
ALPHA2 GLOB SERPL ELPH-MCNC: 0.6 g/dL (ref 0.4–1.0)
Albumin SerPl Elph-Mcnc: 3.9 g/dL (ref 2.9–4.4)
Alpha 1: 0.2 g/dL (ref 0.0–0.4)
B-Globulin SerPl Elph-Mcnc: 0.9 g/dL (ref 0.7–1.3)
Gamma Glob SerPl Elph-Mcnc: 0.9 g/dL (ref 0.4–1.8)
Globulin, Total: 2.5 g/dL (ref 2.2–3.9)
IGG (IMMUNOGLOBIN G), SERUM: 675 mg/dL — AB (ref 700–1600)
IgA: 352 mg/dL (ref 61–437)
IgM, Serum: 53 mg/dL (ref 20–172)
M Protein SerPl Elph-Mcnc: 0.3 g/dL — ABNORMAL HIGH
TOTAL PROTEIN ELP: 6.4 g/dL (ref 6.0–8.5)

## 2015-07-13 LAB — IMMUNOFIXATION ELECTROPHORESIS
IgA: 343 mg/dL (ref 61–437)
IgG (Immunoglobin G), Serum: 692 mg/dL — ABNORMAL LOW (ref 700–1600)
IgM, Serum: 52 mg/dL (ref 20–172)
TOTAL PROTEIN ELP: 6.4 g/dL (ref 6.0–8.5)

## 2015-08-19 ENCOUNTER — Other Ambulatory Visit: Payer: Self-pay | Admitting: Family Medicine

## 2015-08-29 ENCOUNTER — Other Ambulatory Visit: Payer: Self-pay | Admitting: Family Medicine

## 2015-08-31 ENCOUNTER — Other Ambulatory Visit: Payer: Self-pay | Admitting: Family Medicine

## 2015-09-01 ENCOUNTER — Telehealth: Payer: Self-pay | Admitting: Family Medicine

## 2015-09-02 ENCOUNTER — Other Ambulatory Visit: Payer: Self-pay | Admitting: Emergency Medicine

## 2015-09-02 MED ORDER — CLONAZEPAM 0.5 MG PO TABS: ORAL_TABLET | ORAL | 0 refills | Status: DC

## 2015-09-20 NOTE — Telephone Encounter (Signed)
COMPLETE

## 2015-09-22 ENCOUNTER — Other Ambulatory Visit: Payer: Self-pay

## 2015-09-30 ENCOUNTER — Telehealth: Payer: Self-pay | Admitting: Family Medicine

## 2015-09-30 NOTE — Telephone Encounter (Signed)
Patient needs an appointment for medication refills.

## 2015-10-03 NOTE — Telephone Encounter (Signed)
Pt scheduled  

## 2015-10-05 ENCOUNTER — Encounter: Payer: Self-pay | Admitting: Family Medicine

## 2015-10-05 ENCOUNTER — Ambulatory Visit (INDEPENDENT_AMBULATORY_CARE_PROVIDER_SITE_OTHER): Payer: Medicare Other | Admitting: Family Medicine

## 2015-10-05 VITALS — BP 127/74 | HR 68 | Temp 98.6°F | Resp 15 | Ht 67.0 in | Wt 128.8 lb

## 2015-10-05 DIAGNOSIS — F411 Generalized anxiety disorder: Secondary | ICD-10-CM | POA: Diagnosis not present

## 2015-10-05 DIAGNOSIS — G4700 Insomnia, unspecified: Secondary | ICD-10-CM

## 2015-10-05 MED ORDER — TRAZODONE HCL 100 MG PO TABS: 100.0000 mg | ORAL_TABLET | Freq: Every day | ORAL | 1 refills | Status: DC

## 2015-10-05 MED ORDER — CLONAZEPAM 0.5 MG PO TABS: 0.5000 mg | ORAL_TABLET | Freq: Every day | ORAL | 2 refills | Status: DC | PRN

## 2015-10-05 NOTE — Progress Notes (Signed)
Name: [name]   MRN: [id number]    DOB: 24-May-1950   Date:10/05/2015       Progress Note  Subjective  Chief Complaint  Chief Complaint  Patient presents with  . Medication Refill    Insomnia  Primary symptoms: difficulty falling asleep, frequent awakening.  The problem occurs nightly. The problem is unchanged. The symptoms are aggravated by anxiety and pain. Typical bedtime:  8-10 P.M..  How long after going to bed to you fall asleep: 15-30 minutes.    Anxiety  Presents for follow-up visit. The problem has been unchanged. Symptoms include excessive worry, insomnia and nervous/anxious behavior. Patient reports no depressed mood, panic or shortness of breath. The severity of symptoms is moderate. The quality of sleep is good.   His past medical history is significant for depression. There is no history of anxiety/panic attacks. Past treatments include benzodiazephines. The treatment provided moderate relief. Compliance with prior treatments has been good.    Past Medical History:  Diagnosis Date  . Abnormal laboratory test    Sees Dr. Ma Hillock  . Acid reflux   . Arthritis   . Chronic back pain   . Colon cancer ([place]) 11/1999   stage 3 s/p colon resection  . Depression   . History of chemotherapy    5-FU pump/leukovorin  . Insomnia   . Multiple myeloma ([place]) 12/2011   smoldering vs mgus  . Osteoporosis   . Seasonal allergies     Past Surgical History:  Procedure Laterality Date  . COLON SURGERY     resection, 2nd surgery for scar tissue removal  . COLONOSCOPY    . ESOPHAGOGASTRODUODENOSCOPY  10/2010  . LUMBAR FUSION     L4-L5, L5-S1  . SACROILIAC JOINT FUSION    . SHOULDER SURGERY Left   . SPINAL CORD STIMULATOR IMPLANT      Family History  Problem Relation Age of Onset  . Lymphoma Mother   . Lung cancer Father   . Heart disease      grandparents    Social History   Social History  . Marital status: Married    Spouse name: N/A  . Number of children: N/A   . Years of education: N/A   Occupational History  . Not on file.   Social History Main Topics  . Smoking status: Former Research scientist (life sciences)  . Smokeless tobacco: Never Used  . Alcohol use No     Comment: Stopped tuesday.   . Drug use: No  . Sexual activity: Not on file   Other Topics Concern  . Not on file   Social History Narrative  . No narrative on file     Current Outpatient Prescriptions:  .  celecoxib (CELEBREX) 200 MG capsule, TAKE ONE CAPSULE BY MOUTH ONCE DAILY, Disp: 30 capsule, Rfl: 5 .  Cholecalciferol (VITAMIN D) 2000 UNITS CAPS, Take 1 capsule by mouth daily., Disp: , Rfl:  .  clonazePAM (KLONOPIN) 0.5 MG tablet, TAKE ONE-HALF TABLET BY MOUTH TWICE DAILY AS NEEDED FOR ANXIETY, Disp: 60 tablet, Rfl: 0 .  esomeprazole (NEXIUM 24HR) 20 MG capsule, Take by mouth., Disp: , Rfl:  .  fluticasone (FLONASE) 50 MCG/ACT nasal spray, Place into the nose., Disp: , Rfl:  .  Multiple Vitamins-Minerals (CENTRUM SILVER ULTRA MENS) TABS, Take 1 tablet by mouth daily., Disp: , Rfl:  .  traZODone (DESYREL) 100 MG tablet, TAKE 1 TABLET BY MOUTH AT BEDTIME, Disp: 90 tablet, Rfl: 0  Allergies  Allergen Reactions  . Bee Venom  Swelling    Bees     Review of Systems  Respiratory: Negative for shortness of breath.   Psychiatric/Behavioral: The patient is nervous/anxious and has insomnia.      Objective  Vitals:   10/05/15 1541  BP: 127/74  Pulse: 68  Resp: 15  Temp: 98.6 F (37 C)  TempSrc: Oral  SpO2: 97%  Weight: 128 lb 12.8 oz (58.4 kg)  Height: 5' 7"  (1.702 m)    Physical Exam  Constitutional: He is oriented to person, place, and time and well-developed, well-nourished, and in no distress.  HENT:  Head: Normocephalic and atraumatic.  Cardiovascular: Normal rate, regular rhythm and normal heart sounds.   No murmur heard. Pulmonary/Chest: Effort normal and breath sounds normal. He has no wheezes.  Neurological: He is alert and oriented to person, place, and time.   Psychiatric: Mood, memory, affect and judgment normal.  Nursing note and vitals reviewed.    Assessment & Plan  1. Insomnia Insomnia from anxiety and chronic pain, responsive to trazodone taken 100 mg at bedtime. Refills provided - traZODone (DESYREL) 100 MG tablet; Take 1 tablet (100 mg total) by mouth at bedtime.  Dispense: 90 tablet; Refill: 1  2. Generalized anxiety disorder Symptoms of anxiety are stable on clonazepam taken 0.5 mg daily as needed. Refills provided - clonazePAM (KLONOPIN) 0.5 MG tablet; Take 1 tablet (0.5 mg total) by mouth daily as needed for anxiety.  Dispense: 30 tablet; Refill: 2   Mandell Pangborn Asad [facility] 10/05/2015 3:52 PM

## 2016-01-02 ENCOUNTER — Inpatient Hospital Stay: Payer: Medicare Other | Attending: Internal Medicine

## 2016-01-02 DIAGNOSIS — F329 Major depressive disorder, single episode, unspecified: Secondary | ICD-10-CM | POA: Insufficient documentation

## 2016-01-02 DIAGNOSIS — Z79899 Other long term (current) drug therapy: Secondary | ICD-10-CM | POA: Diagnosis not present

## 2016-01-02 DIAGNOSIS — K219 Gastro-esophageal reflux disease without esophagitis: Secondary | ICD-10-CM | POA: Diagnosis not present

## 2016-01-02 DIAGNOSIS — D472 Monoclonal gammopathy: Secondary | ICD-10-CM

## 2016-01-02 DIAGNOSIS — Z85038 Personal history of other malignant neoplasm of large intestine: Secondary | ICD-10-CM | POA: Insufficient documentation

## 2016-01-02 DIAGNOSIS — Z87891 Personal history of nicotine dependence: Secondary | ICD-10-CM | POA: Diagnosis not present

## 2016-01-02 DIAGNOSIS — M1990 Unspecified osteoarthritis, unspecified site: Secondary | ICD-10-CM | POA: Diagnosis not present

## 2016-01-02 DIAGNOSIS — C9000 Multiple myeloma not having achieved remission: Secondary | ICD-10-CM | POA: Insufficient documentation

## 2016-01-02 LAB — CBC WITH DIFFERENTIAL/PLATELET
BASOS ABS: 0 10*3/uL (ref 0–0.1)
Basophils Relative: 1 %
Eosinophils Absolute: 0.2 10*3/uL (ref 0–0.7)
Eosinophils Relative: 3 %
HEMATOCRIT: 42.5 % (ref 40.0–52.0)
Hemoglobin: 14.8 g/dL (ref 13.0–18.0)
LYMPHS PCT: 32 %
Lymphs Abs: 2 10*3/uL (ref 1.0–3.6)
MCH: 32.8 pg (ref 26.0–34.0)
MCHC: 34.8 g/dL (ref 32.0–36.0)
MCV: 94.2 fL (ref 80.0–100.0)
Monocytes Absolute: 0.6 10*3/uL (ref 0.2–1.0)
Monocytes Relative: 10 %
NEUTROS ABS: 3.5 10*3/uL (ref 1.4–6.5)
NEUTROS PCT: 54 %
Platelets: 342 10*3/uL (ref 150–440)
RBC: 4.51 MIL/uL (ref 4.40–5.90)
RDW: 12.8 % (ref 11.5–14.5)
WBC: 6.3 10*3/uL (ref 3.8–10.6)

## 2016-01-02 LAB — COMPREHENSIVE METABOLIC PANEL
ALT: 13 U/L — AB (ref 17–63)
AST: 21 U/L (ref 15–41)
Albumin: 4.2 g/dL (ref 3.5–5.0)
Alkaline Phosphatase: 48 U/L (ref 38–126)
Anion gap: 7 (ref 5–15)
BILIRUBIN TOTAL: 0.8 mg/dL (ref 0.3–1.2)
BUN: 23 mg/dL — AB (ref 6–20)
CHLORIDE: 102 mmol/L (ref 101–111)
CO2: 28 mmol/L (ref 22–32)
CREATININE: 1.22 mg/dL (ref 0.61–1.24)
Calcium: 9.4 mg/dL (ref 8.9–10.3)
GFR calc Af Amer: >60 mL/min (ref >60)
GFR, EST NON AFRICAN AMERICAN: 60 mL/min — AB (ref >60)
GLUCOSE: 99 mg/dL (ref 65–99)
Potassium: 4.1 mmol/L (ref 3.5–5.1)
Sodium: 137 mmol/L (ref 135–145)
Total Protein: 6.9 g/dL (ref 6.5–8.1)

## 2016-01-02 LAB — LACTATE DEHYDROGENASE: LDH: 127 U/L (ref 98–192)

## 2016-01-03 LAB — MULTIPLE MYELOMA PANEL, SERUM
ALBUMIN SERPL ELPH-MCNC: 4.1 g/dL (ref 2.9–4.4)
ALPHA 1: 0.2 g/dL (ref 0.0–0.4)
Albumin/Glob SerPl: 1.6 (ref 0.7–1.7)
Alpha2 Glob SerPl Elph-Mcnc: 0.6 g/dL (ref 0.4–1.0)
B-GLOBULIN SERPL ELPH-MCNC: 0.9 g/dL (ref 0.7–1.3)
Gamma Glob SerPl Elph-Mcnc: 0.9 g/dL (ref 0.4–1.8)
Globulin, Total: 2.6 g/dL (ref 2.2–3.9)
IgA: 376 mg/dL (ref 61–437)
IgG (Immunoglobin G), Serum: 650 mg/dL — ABNORMAL LOW (ref 700–1600)
IgM, Serum: 60 mg/dL (ref 20–172)
M PROTEIN SERPL ELPH-MCNC: 0.3 g/dL — AB
TOTAL PROTEIN ELP: 6.7 g/dL (ref 6.0–8.5)

## 2016-01-03 LAB — KAPPA/LAMBDA LIGHT CHAINS
KAPPA FREE LGHT CHN: 109.3 mg/L — AB (ref 3.3–19.4)
KAPPA, LAMDA LIGHT CHAIN RATIO: 8.28 — AB (ref 0.26–1.65)
LAMDA FREE LIGHT CHAINS: 13.2 mg/L (ref 5.7–26.3)

## 2016-01-04 ENCOUNTER — Ambulatory Visit: Payer: Medicare Other | Admitting: Family Medicine

## 2016-01-09 ENCOUNTER — Ambulatory Visit (INDEPENDENT_AMBULATORY_CARE_PROVIDER_SITE_OTHER): Payer: Medicare Other

## 2016-01-09 ENCOUNTER — Inpatient Hospital Stay: Payer: Medicare Other | Attending: Internal Medicine | Admitting: Internal Medicine

## 2016-01-09 ENCOUNTER — Encounter: Payer: Self-pay | Admitting: Family Medicine

## 2016-01-09 ENCOUNTER — Ambulatory Visit (INDEPENDENT_AMBULATORY_CARE_PROVIDER_SITE_OTHER): Payer: Medicare Other | Admitting: Family Medicine

## 2016-01-09 ENCOUNTER — Encounter: Payer: Self-pay | Admitting: Internal Medicine

## 2016-01-09 VITALS — BP 155/87 | HR 61 | Temp 98.2°F | Ht 67.0 in | Wt 129.6 lb

## 2016-01-09 VITALS — BP 108/68 | HR 64 | Temp 96.8°F | Resp 16 | Ht 67.0 in | Wt 130.0 lb

## 2016-01-09 VITALS — BP 108/68 | HR 64 | Temp 96.8°F | Ht 67.0 in | Wt 130.0 lb

## 2016-01-09 DIAGNOSIS — Z79899 Other long term (current) drug therapy: Secondary | ICD-10-CM | POA: Diagnosis not present

## 2016-01-09 DIAGNOSIS — G4700 Insomnia, unspecified: Secondary | ICD-10-CM | POA: Insufficient documentation

## 2016-01-09 DIAGNOSIS — C9001 Multiple myeloma in remission: Secondary | ICD-10-CM | POA: Diagnosis not present

## 2016-01-09 DIAGNOSIS — Z1159 Encounter for screening for other viral diseases: Secondary | ICD-10-CM | POA: Diagnosis not present

## 2016-01-09 DIAGNOSIS — Z85038 Personal history of other malignant neoplasm of large intestine: Secondary | ICD-10-CM | POA: Diagnosis not present

## 2016-01-09 DIAGNOSIS — Z87891 Personal history of nicotine dependence: Secondary | ICD-10-CM | POA: Insufficient documentation

## 2016-01-09 DIAGNOSIS — Z23 Encounter for immunization: Secondary | ICD-10-CM | POA: Diagnosis not present

## 2016-01-09 DIAGNOSIS — F329 Major depressive disorder, single episode, unspecified: Secondary | ICD-10-CM | POA: Insufficient documentation

## 2016-01-09 DIAGNOSIS — K219 Gastro-esophageal reflux disease without esophagitis: Secondary | ICD-10-CM | POA: Insufficient documentation

## 2016-01-09 DIAGNOSIS — F411 Generalized anxiety disorder: Secondary | ICD-10-CM | POA: Diagnosis not present

## 2016-01-09 DIAGNOSIS — D472 Monoclonal gammopathy: Secondary | ICD-10-CM | POA: Insufficient documentation

## 2016-01-09 DIAGNOSIS — Z9221 Personal history of antineoplastic chemotherapy: Secondary | ICD-10-CM | POA: Diagnosis not present

## 2016-01-09 DIAGNOSIS — Z0000 Encounter for general adult medical examination without abnormal findings: Secondary | ICD-10-CM | POA: Diagnosis not present

## 2016-01-09 DIAGNOSIS — C9000 Multiple myeloma not having achieved remission: Secondary | ICD-10-CM | POA: Insufficient documentation

## 2016-01-09 MED ORDER — CLONAZEPAM 0.5 MG PO TABS: 0.5000 mg | ORAL_TABLET | Freq: Every day | ORAL | 2 refills | Status: DC | PRN

## 2016-01-09 NOTE — Assessment & Plan Note (Addendum)
#  SMOLDERING MULTIPLE MYELOMA [2013 s/p BMBx]. Clinically, patient does not seem to have progressed into active myeloma.   # CBC/creatinine calcium within normal limits- reviewed the numbers with the patient. M protein NOV 2017- 0.3 g; slightly abnormal Kappa lambda light chain ratio [stable].     # We'll continue to follow up every 6 months with M protein/kappa lambda light chain/ CBC CMP. Recommend he have his labs done every week prior to the next visit. He agrees.  # Hx of colo cancer-refer to GI re: colonoscopy.   Cc: Dr.Shah.

## 2016-01-09 NOTE — Progress Notes (Signed)
Name: [name]   MRN: [id number]    DOB: 12/15/1950   Date:01/09/2016       Progress Note  Subjective  Chief Complaint  Chief Complaint  Patient presents with  . Follow-up    3 mo  . Medication Refill    Anxiety  Presents for follow-up visit. The problem has been unchanged. Symptoms include excessive worry, insomnia and nervous/anxious behavior. Patient reports no depressed mood, panic or shortness of breath. Primary symptoms comment: 'I have Obsessive Compulsive Disorder'. The severity of symptoms is moderate. The quality of sleep is good.   His past medical history is significant for depression. There is no history of anxiety/panic attacks. Past treatments include benzodiazephines. The treatment provided moderate relief. Compliance with prior treatments has been good.      Past Medical History:  Diagnosis Date  . Abnormal laboratory test    Sees Dr. Ma Hillock  . Acid reflux   . Arthritis   . Chronic back pain   . Colon cancer ([place]) 11/1999   stage 3 s/p colon resection  . Depression   . History of chemotherapy    5-FU pump/leukovorin  . Insomnia   . Multiple myeloma ([place]) 12/2011   smoldering vs mgus  . Osteoporosis   . Seasonal allergies     Past Surgical History:  Procedure Laterality Date  . COLON SURGERY     resection, 2nd surgery for scar tissue removal  . COLONOSCOPY    . ESOPHAGOGASTRODUODENOSCOPY  10/2010  . LUMBAR FUSION     L4-L5, L5-S1  . SACROILIAC JOINT FUSION    . SHOULDER SURGERY Left   . SPINAL CORD STIMULATOR IMPLANT      Family History  Problem Relation Age of Onset  . Lymphoma Mother   . Lung cancer Father   . Heart disease      grandparents    Social History   Social History  . Marital status: Married    Spouse name: N/A  . Number of children: N/A  . Years of education: N/A   Occupational History  . Not on file.   Social History Main Topics  . Smoking status: Former Research scientist (life sciences)  . Smokeless tobacco: Never Used     Comment: as  a teenager, stopped at age 43  . Alcohol use No     Comment: Stopped tuesday.   . Drug use: No  . Sexual activity: Not on file   Other Topics Concern  . Not on file   Social History Narrative  . No narrative on file     Current Outpatient Prescriptions:  .  celecoxib (CELEBREX) 200 MG capsule, TAKE ONE CAPSULE BY MOUTH ONCE DAILY, Disp: 30 capsule, Rfl: 5 .  Cholecalciferol (VITAMIN D) 2000 UNITS CAPS, Take 1 capsule by mouth daily., Disp: , Rfl:  .  clonazePAM (KLONOPIN) 0.5 MG tablet, Take 1 tablet (0.5 mg total) by mouth daily as needed for anxiety. (Patient taking differently: Take 0.5 mg by mouth daily as needed for anxiety. ), Disp: 30 tablet, Rfl: 2 .  esomeprazole (NEXIUM 24HR) 20 MG capsule, Take by mouth., Disp: , Rfl:  .  fluticasone (FLONASE) 50 MCG/ACT nasal spray, Place into the nose. , Disp: , Rfl:  .  Multiple Vitamins-Minerals (CENTRUM SILVER ULTRA MENS) TABS, Take 1 tablet by mouth daily., Disp: , Rfl:  .  traZODone (DESYREL) 100 MG tablet, Take 1 tablet (100 mg total) by mouth at bedtime., Disp: 90 tablet, Rfl: 1  Allergies  Allergen Reactions  .  Bee Venom Swelling    Bees     Review of Systems  Respiratory: Negative for shortness of breath.   Psychiatric/Behavioral: The patient is nervous/anxious and has insomnia.       Objective  Vitals:   01/09/16 1536  BP: 108/68  Pulse: 64  Resp: 16  Temp: (!) 96.8 F (36 C)  TempSrc: Oral  SpO2: 98%  Weight: 130 lb (59 kg)  Height: 5' 7"  (1.702 m)    Physical Exam  Constitutional: He is oriented to person, place, and time and well-developed, well-nourished, and in no distress.  HENT:  Head: Normocephalic and atraumatic.  Cardiovascular: Normal rate, regular rhythm and normal heart sounds.   No murmur heard. Pulmonary/Chest: Effort normal and breath sounds normal. He has no wheezes.  Neurological: He is alert and oriented to person, place, and time.  Psychiatric: Mood, memory, affect and judgment  normal.  Nursing note and vitals reviewed.       Assessment & Plan  1. Generalized anxiety disorder Stable, taking clonazepam daily as needed. - clonazePAM (KLONOPIN) 0.5 MG tablet; Take 1 tablet (0.5 mg total) by mouth daily as needed for anxiety.  Dispense: 30 tablet; Refill: 2  2. Encounter for hepatitis C screening test for low risk patient  - Hepatitis C Antibody  [name] 01/09/2016 3:42 PM

## 2016-01-09 NOTE — Progress Notes (Signed)
Patient here for multiple myeloma. No changes since last appointment.

## 2016-01-09 NOTE — Progress Notes (Signed)
[name] OFFICE PROGRESS NOTE  Patient Care Team: Roselee Nova, MD as PCP - General (Family Medicine) Cammie Sickle, MD as Consulting Physician (Internal Medicine)   SUMMARY OF ONCOLOGIC HISTORY:  # NOV 2013- SMOLDERING MULTIPLE MYELOMA [IgA Kappa; BMBx- 10-12% plasma cell;Dr.Trillo Frisco]  # 2001- COLON CA STAGE III [s/p chemo; [place]]  INTERVAL HISTORY:  A very pleasant 65 year old male patient with above history of smoldering myeloma currently being monitored is here for follow-up.  Denies any worsening back pain. Patient has history of chronic back pain/arthritis. He noted to have slight pain in his left scapular region over the last few weeks. Currently resolved.   He denies any worsening of his back pain. Denies any unusual tingling or numbness of extremities. Denies any significant weight loss.He is fairly active-golfing.    REVIEW OF SYSTEMS:  A complete 10 point review of system is done which is negative except mentioned above/history of present illness.   PAST MEDICAL HISTORY :  Past Medical History:  Diagnosis Date  . Abnormal laboratory test    Sees Dr. Ma Hillock  . Acid reflux   . Arthritis   . Chronic back pain   . Colon cancer ([place]) 11/1999   stage 3 s/p colon resection  . Depression   . History of chemotherapy    5-FU pump/leukovorin  . Insomnia   . Multiple myeloma ([place]) 12/2011   smoldering vs mgus  . Osteoporosis   . Seasonal allergies     PAST SURGICAL HISTORY :   Past Surgical History:  Procedure Laterality Date  . COLON SURGERY     resection, 2nd surgery for scar tissue removal  . COLONOSCOPY    . ESOPHAGOGASTRODUODENOSCOPY  10/2010  . LUMBAR FUSION     L4-L5, L5-S1  . SACROILIAC JOINT FUSION    . SHOULDER SURGERY Left   . SPINAL CORD STIMULATOR IMPLANT      FAMILY HISTORY :   Family History  Problem Relation Age of Onset  . Lymphoma Mother   . Lung cancer Father   . Heart disease      grandparents     SOCIAL HISTORY:   Social History  Substance Use Topics  . Smoking status: Former Research scientist (life sciences)  . Smokeless tobacco: Never Used     Comment: as a teenager, stopped at age 28  . Alcohol use No     Comment: Stopped tuesday.     ALLERGIES:  is allergic to bee venom.  MEDICATIONS:  Current Outpatient Prescriptions  Medication Sig Dispense Refill  . celecoxib (CELEBREX) 200 MG capsule TAKE ONE CAPSULE BY MOUTH ONCE DAILY 30 capsule 5  . Cholecalciferol (VITAMIN D) 2000 UNITS CAPS Take 1 capsule by mouth daily.    . clonazePAM (KLONOPIN) 0.5 MG tablet Take 1 tablet (0.5 mg total) by mouth daily as needed for anxiety. 30 tablet 2  . esomeprazole (NEXIUM 24HR) 20 MG capsule Take by mouth.    . fluticasone (FLONASE) 50 MCG/ACT nasal spray Place into the nose.     . Multiple Vitamins-Minerals (CENTRUM SILVER ULTRA MENS) TABS Take 1 tablet by mouth daily.    . traZODone (DESYREL) 100 MG tablet Take 1 tablet (100 mg total) by mouth at bedtime. 90 tablet 1   No current facility-administered medications for this visit.     PHYSICAL EXAMINATION:  BP (!) 155/87 (BP Location: Left Arm, Patient Position: Sitting)   Pulse 61   Temp 98.2 F (36.8 C) (Oral)   Ht 5'  7" (1.702 m)   Wt 129 lb 9.6 oz (58.8 kg)   BMI 20.30 kg/m   Filed Weights   01/09/16 1040  Weight: 129 lb 9.6 oz (58.8 kg)    GENERAL: Well-nourished well-developed; Alert, no distress and comfortable. Accompanied by his wife.  EYES: no pallor or icterus OROPHARYNX: no thrush or ulceration; good dentition  NECK: supple, no masses felt LYMPH:  no palpable lymphadenopathy in the cervical, axillary or inguinal regions LUNGS: clear to auscultation and  No wheeze or crackles HEART/CVS: regular rate & rhythm and no murmurs; No lower extremity edema ABDOMEN:abdomen soft, non-tender and normal bowel sounds Musculoskeletal:no cyanosis of digits and no clubbing  PSYCH: alert & oriented x 3 with fluent speech NEURO: no focal  motor/sensory deficits SKIN:  no rashes or significant lesions  LABORATORY DATA:  I have reviewed the data as listed    Component Value Date/Time   NA 137 01/02/2016 1025   K 4.1 01/02/2016 1025   CL 102 01/02/2016 1025   CO2 28 01/02/2016 1025   GLUCOSE 99 01/02/2016 1025   BUN 23 (H) 01/02/2016 1025   CREATININE 1.22 01/02/2016 1025   CREATININE 1.34 (H) 12/28/2013 1428   CALCIUM 9.4 01/02/2016 1025   CALCIUM 9.6 12/28/2013 1428   PROT 6.9 01/02/2016 1025   PROT 6.7 06/27/2012 1136   ALBUMIN 4.2 01/02/2016 1025   ALBUMIN 3.5 06/27/2012 1136   AST 21 01/02/2016 1025   AST 17 06/27/2012 1136   ALT 13 (L) 01/02/2016 1025   ALT 22 06/27/2012 1136   ALKPHOS 48 01/02/2016 1025   ALKPHOS 91 06/27/2012 1136   BILITOT 0.8 01/02/2016 1025   BILITOT 0.3 06/27/2012 1136   GFRNONAA 60 (L) 01/02/2016 1025   GFRNONAA 57 (L) 12/28/2013 1428   GFRNONAA >60 06/25/2013 1413   GFRAA >60 01/02/2016 1025   GFRAA >60 12/28/2013 1428   GFRAA >60 06/25/2013 1413    No results found for: SPEP, UPEP  Lab Results  Component Value Date   WBC 6.3 01/02/2016   NEUTROABS 3.5 01/02/2016   HGB 14.8 01/02/2016   HCT 42.5 01/02/2016   MCV 94.2 01/02/2016   PLT 342 01/02/2016      Chemistry      Component Value Date/Time   NA 137 01/02/2016 1025   K 4.1 01/02/2016 1025   CL 102 01/02/2016 1025   CO2 28 01/02/2016 1025   BUN 23 (H) 01/02/2016 1025   CREATININE 1.22 01/02/2016 1025   CREATININE 1.34 (H) 12/28/2013 1428      Component Value Date/Time   CALCIUM 9.4 01/02/2016 1025   CALCIUM 9.6 12/28/2013 1428   ALKPHOS 48 01/02/2016 1025   ALKPHOS 91 06/27/2012 1136   AST 21 01/02/2016 1025   AST 17 06/27/2012 1136   ALT 13 (L) 01/02/2016 1025   ALT 22 06/27/2012 1136   BILITOT 0.8 01/02/2016 1025   BILITOT 0.3 06/27/2012 1136     Results for [name], [name] (MRN [id number]) as of 01/09/2016 11:23  Ref. Range 06/28/2014 15:14 01/03/2015 14:30 07/11/2015 11:30 07/11/2015 11:30 01/02/2016  10:25  Kappa free light chain Latest Ref Range: 3.3 - 19.4 mg/L  105.74 (H) 113.7 (H)  109.3 (H)  Lamda free light chains Latest Ref Range: 5.7 - 26.3 mg/L  13.23 13.9  13.2  Kappa, lamda light chain ratio Latest Ref Range: 0.26 - 1.65   7.99 (H) 8.18 (H)  8.28 (H)  M Protein SerPl Elph-Mcnc Latest Ref Range: Not Observed g/dL  0.3 (H)  0.3 (H)    RADIOGRAPHIC STUDIES: I have personally reviewed the radiological images as listed and agreed with the findings in the report. No results found.   ASSESSMENT & PLAN:  Smoldering multiple myeloma (SMM) ([place]) # SMOLDERING MULTIPLE MYELOMA [2013 s/p BMBx]. Clinically, patient does not seem to have progressed into active myeloma.   # CBC/creatinine calcium within normal limits- reviewed the numbers with the patient. M protein NOV 2017- 0.3 g; slightly abnormal Kappa lambda light chain ratio [stable].     # We'll continue to follow up every 6 months with M protein/kappa lambda light chain/ CBC CMP. Recommend he have his labs done every week prior to the next visit. He agrees.  # Hx of colo cancer-refer to GI re: colonoscopy.   Cc: Dr.Shah.      Cammie Sickle, MD 01/10/2016 8:15 AM

## 2016-01-09 NOTE — Progress Notes (Signed)
Subjective:   [name] is a 65 y.o. male who presents for an Initial Medicare Annual Wellness Visit.  Review of Systems  N/A  Cardiac Risk Factors include: advanced age (>23mn, >>41women);male gender    Objective:    Today's Vitals   01/09/16 1503 01/09/16 1509  BP: 108/68   Pulse: 64   Temp: (!) 96.8 F (36 C)   TempSrc: Oral   Weight: 130 lb (59 kg)   Height: 5' 7"  (1.702 m)   PainSc: 0-No pain 0-No pain   Body mass index is 20.36 kg/m.  Current Medications (verified) Outpatient Encounter Prescriptions as of 01/09/2016  Medication Sig  . celecoxib (CELEBREX) 200 MG capsule TAKE ONE CAPSULE BY MOUTH ONCE DAILY  . Cholecalciferol (VITAMIN D) 2000 UNITS CAPS Take 1 capsule by mouth daily.  . clonazePAM (KLONOPIN) 0.5 MG tablet Take 1 tablet (0.5 mg total) by mouth daily as needed for anxiety. (Patient taking differently: Take 0.5 mg by mouth daily as needed for anxiety. )  . esomeprazole (NEXIUM 24HR) 20 MG capsule Take by mouth.  . fluticasone (FLONASE) 50 MCG/ACT nasal spray Place into the nose.   . Multiple Vitamins-Minerals (CENTRUM SILVER ULTRA MENS) TABS Take 1 tablet by mouth daily.  . traZODone (DESYREL) 100 MG tablet Take 1 tablet (100 mg total) by mouth at bedtime.   No facility-administered encounter medications on file as of 01/09/2016.     Allergies (verified) Bee venom   History: Past Medical History:  Diagnosis Date  . Abnormal laboratory test    Sees Dr. PMa [name] . Acid reflux   . Arthritis   . Chronic back pain   . [place]   stage 3 s/p colon resection  . Depression   . History of chemotherapy    5-FU pump/leukovorin  . Insomnia   . [place]   smoldering vs mgus  . Osteoporosis   . Seasonal allergies    Past Surgical History:  Procedure Laterality Date  . COLON SURGERY     resection, 2nd surgery for scar tissue removal  . COLONOSCOPY    . ESOPHAGOGASTRODUODENOSCOPY  10/2010  . LUMBAR  FUSION     L4-L5, L5-S1  . SACROILIAC JOINT FUSION    . SHOULDER SURGERY Left   . SPINAL CORD STIMULATOR IMPLANT     Family History  Problem Relation Age of Onset  . Lymphoma Mother   . Lung cancer Father   . Heart disease      grandparents   Social History   Occupational History  . Not on file.   Social History Main Topics  . Smoking status: Former SResearch scientist (life sciences) . Smokeless tobacco: Never Used     Comment: as a teenager, stopped at age 65 . Alcohol use No     Comment: Stopped tuesday.   . Drug use: No  . Sexual activity: Not on file   Tobacco Counseling Counseling given: Not Answered   Activities of Daily Living In your present state of health, do you have any difficulty performing the following activities: 01/09/2016 01/09/2016  Hearing? N N  Vision? N N  Difficulty concentrating or making decisions? N N  Walking or climbing stairs? N N  Dressing or bathing? N N  Doing errands, shopping? N N  Preparing Food and eating ? - N  Using the Toilet? - N  In the past six months, have you accidently leaked urine? - Y  Do you have problems  with loss of bowel control? - N  Managing your Medications? - N  Managing your Finances? - N  Housekeeping or managing your Housekeeping? - N  Some recent data might be hidden    Immunizations and Health Maintenance Immunization History  Administered Date(s) Administered  . Influenza, High Dose Seasonal PF 11/04/2014  . Pneumococcal Conjugate-13 01/09/2016   Health Maintenance Due  Topic Date Due  . Hepatitis C Screening  December 23, 1950    Patient Care Team: Christopher Nova, MD as PCP - General (Family Medicine) Christopher Sickle, MD as Consulting Physician (Internal Medicine)  Indicate any recent Medical Services you may have received from other than [facility] providers in the past year (date may be approximate).    Assessment:   This is a routine wellness examination for [name].   Hearing/Vision screen Vision Screening Comments: Pt  does not see an eye doctor. Had lasix surgery years ago.  Dietary issues and exercise activities discussed: Current Exercise Habits: Home exercise routine, Type of exercise: Other - see comments (bowling and golf), Time (Minutes): > 60, Frequency (Times/Week): 3 (-4 days/week), Weekly Exercise (Minutes/Week): 0, Exercise limited by: None identified  Goals    . Increase water intake          Starting 01/09/16, I will continue drinking 2 glasses of water a day.      Depression Screen PHQ 2/9 Scores 01/09/2016 01/09/2016 10/05/2015 05/18/2015  PHQ - 2 Score 0 0 0 0    Fall Risk Fall Risk  01/09/2016 01/09/2016 10/05/2015 05/18/2015 02/16/2015  Falls in the past year? No No No No No    Cognitive Function:     6CIT Screen 01/09/2016  What Year? 0 points  What month? 0 points  What time? 0 points  Count back from 20 0 points  Months in reverse 0 points  Repeat phrase 2 points  Total Score 2    Screening Tests Health Maintenance  Topic Date Due  . Hepatitis C Screening  Dec 22, 1950  . INFLUENZA VACCINE  01/16/2016 (Originally 09/06/2015)  . PNA vac Low Risk Adult (2 of 2 - PPSV23) 01/08/2017  . COLONOSCOPY  03/10/2019  . TETANUS/TDAP  07/07/2022  . ZOSTAVAX  Completed        Plan:  I have personally reviewed and addressed the Medicare Annual Wellness questionnaire and have noted the following in the patient's chart:  A. Medical and social history B. Use of alcohol, tobacco or illicit drugs  C. Current medications and supplements D. Functional ability and status E.  Nutritional status F.  Physical activity G. Advance directives H. List of other physicians I.  Hospitalizations, surgeries, and ER visits in previous 12 months J.  [place] such as hearing and vision if needed, cognitive and depression L. Referrals and appointments - none  In addition, I have reviewed and discussed with patient certain preventive protocols, quality metrics, and best practice  recommendations. A written personalized care plan for preventive services as well as general preventive health recommendations were provided to patient.  See attached scanned questionnaire for additional information.   Signed,  Christopher Neighbors, LPN Nurse Health Advisor  MD Recommendations: follow up on Hepatitis C screening. I, as supervising physician, have reviewed the nurse health advisor's Medicare Wellness Visit note for this patient and concur with the findings and recommendations listed above.  Signed Syed Asad A. Manuella Ghazi MD Attending Physician.

## 2016-01-09 NOTE — Patient Instructions (Signed)
Mr. Bach , Thank you for taking time to come for your Medicare Wellness Visit. I appreciate your ongoing commitment to your health goals. Please review the following plan we discussed and let me know if I can assist you in the future.   These are the goals we discussed: Goals    . Increase water intake          Starting 01/09/16, I will continue drinking 2 glasses of water a day.       This is a list of the screening recommended for you and due dates:  Health Maintenance  Topic Date Due  .  Hepatitis C: One time screening is recommended by Center for Disease Control  (CDC) for  adults born from [birth year] through 1967.   25-Apr-1950  . Flu Shot  01/16/2016*  . Pneumonia vaccines (2 of 2 - PPSV23) 01/08/2017  . Colon Cancer Screening  03/10/2019  . Tetanus Vaccine  07/07/2022  . Shingles Vaccine  Completed  *Topic was postponed. The date shown is not the original due date.   Preventive Care for Adults  A healthy lifestyle and preventive care can promote health and wellness. Preventive health guidelines for adults include the following key practices.  . A routine yearly physical is a good way to check with your health care provider about your health and preventive screening. It is a chance to share any concerns and updates on your health and to receive a thorough exam.  . Visit your dentist for a routine exam and preventive care every 6 months. Brush your teeth twice a day and floss once a day. Good oral hygiene prevents tooth decay and gum disease.  . The frequency of eye exams is based on your age, health, family medical history, use  of contact lenses, and other factors. Follow your health care provider's ecommendations for frequency of eye exams.  . Eat a healthy diet. Foods like vegetables, fruits, whole grains, low-fat dairy products, and lean protein foods contain the nutrients you need without too many calories. Decrease your intake of foods high in solid fats, added sugars, and  salt. Eat the right amount of calories for you. Get information about a proper diet from your health care provider, if necessary.  . Regular physical exercise is one of the most important things you can do for your health. Most adults should get at least 150 minutes of moderate-intensity exercise (any activity that increases your heart rate and causes you to sweat) each week. In addition, most adults need muscle-strengthening exercises on 2 or more days a week.  Silver Sneakers may be a benefit available to you. To determine eligibility, you may visit the website: www.silversneakers.com or contact program at 458-417-8981 Mon-Fri between 8AM-8PM.   . Maintain a healthy weight. The body mass index (BMI) is a screening tool to identify possible weight problems. It provides an estimate of body fat based on height and weight. Your health care provider can find your BMI and can help you achieve or maintain a healthy weight.   For adults 20 years and older: ? A BMI below 18.5 is considered underweight. ? A BMI of 18.5 to 24.9 is normal. ? A BMI of 25 to 29.9 is considered overweight. ? A BMI of 30 and above is considered obese.   . Maintain normal blood lipids and cholesterol levels by exercising and minimizing your intake of saturated fat. Eat a balanced diet with plenty of fruit and vegetables. Blood tests for lipids and cholesterol  should begin at age 58 and be repeated every 5 years. If your lipid or cholesterol levels are high, you are over 50, or you are at high risk for heart disease, you may need your cholesterol levels checked more frequently. Ongoing high lipid and cholesterol levels should be treated with medicines if diet and exercise are not working.  . If you smoke, find out from your health care provider how to quit. If you do not use tobacco, please do not start.  . If you choose to drink alcohol, please do not consume more than 2 drinks per day. One drink is considered to be 12 ounces  (355 mL) of beer, 5 ounces (148 mL) of wine, or 1.5 ounces (44 mL) of liquor.  . If you are 39-70 years old, ask your health care provider if you should take aspirin to prevent strokes.  . Use sunscreen. Apply sunscreen liberally and repeatedly throughout the day. You should seek shade when your shadow is shorter than you. Protect yourself by wearing long sleeves, pants, a wide-brimmed hat, and sunglasses year round, whenever you are outdoors.  . Once a month, do a whole body skin exam, using a mirror to look at the skin on your back. Tell your health care provider of new moles, moles that have irregular borders, moles that are larger than a pencil eraser, or moles that have changed in shape or color.

## 2016-01-10 LAB — HEPATITIS C ANTIBODY: HCV Ab: NEGATIVE

## 2016-01-11 ENCOUNTER — Other Ambulatory Visit: Payer: Self-pay | Admitting: *Deleted

## 2016-01-11 DIAGNOSIS — Z85038 Personal history of other malignant neoplasm of large intestine: Secondary | ICD-10-CM

## 2016-01-11 NOTE — Progress Notes (Signed)
As per patient request referral changed from [facility] to somewhere local.

## 2016-01-16 ENCOUNTER — Ambulatory Visit (INDEPENDENT_AMBULATORY_CARE_PROVIDER_SITE_OTHER): Payer: Medicare Other | Admitting: Family Medicine

## 2016-01-16 ENCOUNTER — Encounter: Payer: Self-pay | Admitting: Family Medicine

## 2016-01-16 DIAGNOSIS — R109 Unspecified abdominal pain: Secondary | ICD-10-CM | POA: Diagnosis not present

## 2016-01-16 MED ORDER — DICYCLOMINE HCL 20 MG PO TABS: 10.0000 mg | ORAL_TABLET | Freq: Four times a day (QID) | ORAL | 0 refills | Status: DC

## 2016-01-16 NOTE — Progress Notes (Signed)
Name: [name]   MRN: [id number]    DOB: 12/02/1950   Date:01/16/2016       Progress Note  Subjective  Chief Complaint  Chief Complaint  Patient presents with  . Follow-up    HPI  Abdominal Spasm and Pain: Pt. Presents to discuss concerns about long standing abdominal pain, he was diagnosed with colon cancer in 2001, undergoing partial colon resection the same year. He started having pain in lower abdomen around the site of surgery right away and has had it ever since. He was taking Hyoscyamine 0.125 mg every 6 hours as needed, which helped relive his pain to some extent. He had a surgery to remove the scar tissue in 2012 which helped some with the pain but did no completely eliminate it. Hyoscyamine is not available/approved now and is also not available in the formulary. He is requesting an alternative for GI spasm and pain. Pain is described as cramping/aching in nature, gets worse at certain times, not necessarily related to food, sometimes loses appetite with the pain, no nausea or vomiting.     Past Medical History:  Diagnosis Date  . Abnormal laboratory test    Sees Dr. Ma Hillock  . Acid reflux   . Arthritis   . Chronic back pain   . Colon cancer ([place]) 11/1999   stage 3 s/p colon resection  . Depression   . History of chemotherapy    5-FU pump/leukovorin  . Insomnia   . Multiple myeloma ([place]) 12/2011   smoldering vs mgus  . Osteoporosis   . Seasonal allergies     Past Surgical History:  Procedure Laterality Date  . COLON SURGERY     resection, 2nd surgery for scar tissue removal  . COLONOSCOPY    . ESOPHAGOGASTRODUODENOSCOPY  10/2010  . LUMBAR FUSION     L4-L5, L5-S1  . SACROILIAC JOINT FUSION    . SHOULDER SURGERY Left   . SPINAL CORD STIMULATOR IMPLANT      Family History  Problem Relation Age of Onset  . Lymphoma Mother   . Lung cancer Father   . Heart disease      grandparents    Social History   Social History  . Marital status: Married   Spouse name: N/A  . Number of children: N/A  . Years of education: N/A   Occupational History  . Not on file.   Social History Main Topics  . Smoking status: Former Research scientist (life sciences)  . Smokeless tobacco: Never Used     Comment: as a teenager, stopped at age 46  . Alcohol use No     Comment: Stopped tuesday.   . Drug use: No  . Sexual activity: Not on file   Other Topics Concern  . Not on file   Social History Narrative  . No narrative on file     Current Outpatient Prescriptions:  .  celecoxib (CELEBREX) 200 MG capsule, TAKE ONE CAPSULE BY MOUTH ONCE DAILY, Disp: 30 capsule, Rfl: 5 .  Cholecalciferol (VITAMIN D) 2000 UNITS CAPS, Take 1 capsule by mouth daily., Disp: , Rfl:  .  clonazePAM (KLONOPIN) 0.5 MG tablet, Take 1 tablet (0.5 mg total) by mouth daily as needed for anxiety., Disp: 30 tablet, Rfl: 2 .  esomeprazole (NEXIUM 24HR) 20 MG capsule, Take by mouth., Disp: , Rfl:  .  fluticasone (FLONASE) 50 MCG/ACT nasal spray, Place into the nose. , Disp: , Rfl:  .  Multiple Vitamins-Minerals (CENTRUM SILVER ULTRA MENS) TABS, Take 1 tablet  by mouth daily., Disp: , Rfl:  .  traZODone (DESYREL) 100 MG tablet, Take 1 tablet (100 mg total) by mouth at bedtime., Disp: 90 tablet, Rfl: 1  Allergies  Allergen Reactions  . Bee Venom Swelling    Bees     ROS  Please see history of present illness for complete discussion of ROS  Objective  Vitals:   01/16/16 1449  BP: 120/76  Pulse: 79  Resp: 16  Temp: 98.2 F (36.8 C)  TempSrc: Oral  SpO2: 95%  Weight: 132 lb 12.8 oz (60.2 kg)  Height: 5' 7" (1.702 m)    Physical Exam  Constitutional: He is well-developed, well-nourished, and in no distress.  Abdominal: Soft. Bowel sounds are normal. There is no tenderness.    Surgical scar along the lower abdominal wall in midline  Nursing note and vitals reviewed.       Assessment & Plan  1. Abdominal spasms We'll start on Bentyl 10 mg to be taken every 6 hours, follow-up in one  month - dicyclomine (BENTYL) 20 MG tablet; Take 0.5 tablets (10 mg total) by mouth every 6 (six) hours.  Dispense: 60 tablet; Refill: 0    [facility] 01/16/2016 3:13 PM

## 2016-01-26 DIAGNOSIS — Z23 Encounter for immunization: Secondary | ICD-10-CM | POA: Diagnosis not present

## 2016-02-16 ENCOUNTER — Other Ambulatory Visit: Payer: Self-pay | Admitting: Family Medicine

## 2016-03-13 DIAGNOSIS — G894 Chronic pain syndrome: Secondary | ICD-10-CM | POA: Diagnosis not present

## 2016-03-13 DIAGNOSIS — M961 Postlaminectomy syndrome, not elsewhere classified: Secondary | ICD-10-CM | POA: Diagnosis not present

## 2016-03-13 DIAGNOSIS — M545 Low back pain: Secondary | ICD-10-CM | POA: Diagnosis not present

## 2016-03-13 DIAGNOSIS — M4727 Other spondylosis with radiculopathy, lumbosacral region: Secondary | ICD-10-CM | POA: Diagnosis not present

## 2016-03-16 DIAGNOSIS — Z85038 Personal history of other malignant neoplasm of large intestine: Secondary | ICD-10-CM | POA: Diagnosis not present

## 2016-03-21 ENCOUNTER — Other Ambulatory Visit: Payer: Self-pay | Admitting: Family Medicine

## 2016-03-21 DIAGNOSIS — R109 Unspecified abdominal pain: Secondary | ICD-10-CM

## 2016-03-23 ENCOUNTER — Encounter: Payer: Self-pay | Admitting: *Deleted

## 2016-03-26 ENCOUNTER — Ambulatory Visit: Payer: Medicare Other | Admitting: Anesthesiology

## 2016-03-26 ENCOUNTER — Encounter
Admission: RE | Disposition: A | Discharge status: Home / Self Care | Payer: Self-pay | Source: Ambulatory Visit | Attending: Gastroenterology

## 2016-03-26 ENCOUNTER — Encounter: Payer: Self-pay | Admitting: *Deleted

## 2016-03-26 ENCOUNTER — Ambulatory Visit
Admission: RE | Admit: 2016-03-26 | Discharge: 2016-03-26 | Disposition: A | Discharge status: Home / Self Care | Payer: Medicare Other | Source: Ambulatory Visit | Attending: Gastroenterology | Admitting: Gastroenterology

## 2016-03-26 DIAGNOSIS — Z8579 Personal history of other malignant neoplasms of lymphoid, hematopoietic and related tissues: Secondary | ICD-10-CM | POA: Insufficient documentation

## 2016-03-26 DIAGNOSIS — Z9049 Acquired absence of other specified parts of digestive tract: Secondary | ICD-10-CM | POA: Diagnosis not present

## 2016-03-26 DIAGNOSIS — Z9221 Personal history of antineoplastic chemotherapy: Secondary | ICD-10-CM | POA: Diagnosis not present

## 2016-03-26 DIAGNOSIS — Z85038 Personal history of other malignant neoplasm of large intestine: Secondary | ICD-10-CM | POA: Diagnosis not present

## 2016-03-26 DIAGNOSIS — Z1211 Encounter for screening for malignant neoplasm of colon: Secondary | ICD-10-CM | POA: Diagnosis not present

## 2016-03-26 DIAGNOSIS — K635 Polyp of colon: Secondary | ICD-10-CM | POA: Diagnosis not present

## 2016-03-26 DIAGNOSIS — Z791 Long term (current) use of non-steroidal anti-inflammatories (NSAID): Secondary | ICD-10-CM | POA: Insufficient documentation

## 2016-03-26 DIAGNOSIS — Z87891 Personal history of nicotine dependence: Secondary | ICD-10-CM | POA: Diagnosis not present

## 2016-03-26 DIAGNOSIS — Z7951 Long term (current) use of inhaled steroids: Secondary | ICD-10-CM | POA: Insufficient documentation

## 2016-03-26 DIAGNOSIS — Z79899 Other long term (current) drug therapy: Secondary | ICD-10-CM | POA: Diagnosis not present

## 2016-03-26 DIAGNOSIS — D127 Benign neoplasm of rectosigmoid junction: Secondary | ICD-10-CM | POA: Diagnosis not present

## 2016-03-26 HISTORY — PX: COLONOSCOPY WITH PROPOFOL: SHX5780

## 2016-03-26 SURGERY — COLONOSCOPY WITH PROPOFOL
Anesthesia: General

## 2016-03-26 MED ORDER — SODIUM CHLORIDE 0.9 % IV SOLN: INTRAVENOUS | Status: DC

## 2016-03-26 MED ORDER — SODIUM CHLORIDE 0.9 % IV SOLN
INTRAVENOUS | Status: DC
  Administered 2016-03-26: 1000 mL via INTRAVENOUS

## 2016-03-26 MED ORDER — PROPOFOL 500 MG/50ML IV EMUL
INTRAVENOUS | Status: AC
  Filled 2016-03-26: qty 100

## 2016-03-26 MED ORDER — LIDOCAINE HCL (PF) 2 % IJ SOLN
INTRAMUSCULAR | Status: DC | PRN
  Administered 2016-03-26: 50 mg via INTRADERMAL

## 2016-03-26 MED ORDER — PROPOFOL 10 MG/ML IV BOLUS
INTRAVENOUS | Status: DC | PRN
  Administered 2016-03-26: 20 mg via INTRAVENOUS
  Administered 2016-03-26: 50 mg via INTRAVENOUS
  Administered 2016-03-26: 30 mg via INTRAVENOUS

## 2016-03-26 MED ORDER — SODIUM CHLORIDE 0.9 % IV SOLN
INTRAVENOUS | Status: DC | PRN
  Administered 2016-03-26: 100 ug via INTRAVENOUS

## 2016-03-26 MED ORDER — SODIUM CHLORIDE 0.9 % IV SOLN
INTRAVENOUS | Status: DC | PRN
  Administered 2016-03-26: 10:00:00 via INTRAVENOUS

## 2016-03-26 MED ORDER — PROPOFOL 500 MG/50ML IV EMUL
INTRAVENOUS | Status: DC | PRN
  Administered 2016-03-26: 120 ug/kg/min via INTRAVENOUS

## 2016-03-26 NOTE — Anesthesia Postprocedure Evaluation (Signed)
Anesthesia Post Note  Patient: CONELL NARDOLILLO  Procedure(s) Performed: Procedure(s) (LRB): COLONOSCOPY WITH PROPOFOL (N/A)  Patient location during evaluation: Endoscopy Anesthesia Type: General Level of consciousness: awake and alert and oriented Pain management: pain level controlled Vital Signs Assessment: post-procedure vital signs reviewed and stable Respiratory status: spontaneous breathing, nonlabored ventilation and respiratory function stable Cardiovascular status: blood pressure returned to baseline and stable Postop Assessment: no signs of nausea or vomiting Anesthetic complications: no     Last Vitals:  Vitals:   03/26/16 1140 03/26/16 1150  BP: (!) 155/96 (!) 165/107  Pulse: 72 73  Resp: (!) 8 18  Temp:      Last Pain:  Vitals:   03/26/16 1120  TempSrc: Tympanic  PainSc:                  Tamryn Popko

## 2016-03-26 NOTE — Anesthesia Post-op Follow-up Note (Signed)
Anesthesia QCDR form completed.        

## 2016-03-26 NOTE — Anesthesia Preprocedure Evaluation (Signed)
Anesthesia Evaluation  Patient identified by MRN, date of birth, ID band Patient awake    Reviewed: Allergy & Precautions, NPO status , Patient's Chart, lab work & pertinent test results  History of Anesthesia Complications Negative for: history of anesthetic complications  Airway Mallampati: III  TM Distance: >3 FB Neck ROM: Full    Dental  (+) Poor Dentition   Pulmonary neg sleep apnea, former smoker,    breath sounds clear to auscultation- rhonchi (-) wheezing      Cardiovascular Exercise Tolerance: Good (-) hypertension(-) CAD and (-) Past MI  Rhythm:Regular Rate:Normal - Systolic murmurs and - Diastolic murmurs    Neuro/Psych PSYCHIATRIC DISORDERS Depression negative neurological ROS     GI/Hepatic Neg liver ROS, GERD  ,  Endo/Other  negative endocrine ROSneg diabetes  Renal/GU negative Renal ROS     Musculoskeletal  (+) Arthritis ,   Abdominal (+) - obese,   Peds  Hematology negative hematology ROS (+)   Anesthesia Other Findings Past Medical History: No date: Abnormal laboratory test     Comment: Sees Dr. Ma Hillock No date: Acid reflux No date: Arthritis No date: Chronic back pain 11/1999: Colon cancer ([place])     Comment: stage 3 s/p colon resection No date: Depression No date: History of chemotherapy     Comment: 5-FU pump/leukovorin No date: Insomnia 12/2011: Multiple myeloma ([facility])     Comment: smoldering vs mgus No date: Osteoporosis No date: Seasonal allergies   Reproductive/Obstetrics                             Anesthesia Physical Anesthesia Plan  ASA: II  Anesthesia Plan: General   Post-op Pain Management:    Induction: Intravenous  Airway Management Planned: Natural Airway  Additional Equipment:   Intra-op Plan:   Post-operative Plan:   Informed Consent: I have reviewed the patients History and Physical, chart, labs and discussed the procedure  including the risks, benefits and alternatives for the proposed anesthesia with the patient or authorized representative who has indicated his/her understanding and acceptance.   Dental advisory given  Plan Discussed with: CRNA and Anesthesiologist  Anesthesia Plan Comments:         Anesthesia Quick Evaluation

## 2016-03-26 NOTE — H&P (Signed)
Outpatient short stay form Pre-procedure 03/26/2016 10:52 AM Lollie Sails MD  Primary Physician: Dr Rogue Bussing  Reason for visit:  Colonoscopy  History of present illness:  Patient is a 66 year old male with a personal history of colon cancer that diagnosed in 2001. He had a surgical procedure as well as adjuvant chemotherapy. As colonoscopy was about 5 years ago. No aspirin products. He tolerated his prep well. He takes no blood thinning agents.    Current Facility-Administered Medications:  .  0.9 %  sodium chloride infusion, , Intravenous, Continuous, Lollie Sails, MD, Last Rate: 20 mL/hr at 03/26/16 0943, 1,000 mL at 03/26/16 0943 .  0.9 %  sodium chloride infusion, , Intravenous, Continuous, Lollie Sails, MD  Facility-Administered Medications Ordered in Other Encounters:  .  0.9 %  sodium chloride infusion, , Intravenous, Continuous PRN, Amy Penwarden, MD .  lidocaine (XYLOCAINE) 2 % injection, , , Anesthesia Intra-op, Amy Penwarden, MD, 50 mg at 03/26/16 1048  Prescriptions Prior to Admission  Medication Sig Dispense Refill Last Dose  . celecoxib (CELEBREX) 200 MG capsule TAKE ONE CAPSULE BY MOUTH ONCE DAILY 30 capsule 5 Past Week at Unknown time  . Cholecalciferol (VITAMIN D) 2000 UNITS CAPS Take 1 capsule by mouth daily.   Past Week at Unknown time  . clonazePAM (KLONOPIN) 0.5 MG tablet Take 1 tablet (0.5 mg total) by mouth daily as needed for anxiety. 30 tablet 2 Past Week at Unknown time  . dicyclomine (BENTYL) 20 MG tablet TAKE ONE-HALF TABLET BY MOUTH EVERY 6 HOURS 180 tablet 0 03/25/2016 at Unknown time  . esomeprazole (NEXIUM 24HR) 20 MG capsule Take by mouth.   Past Week at Unknown time  . fluticasone (FLONASE) 50 MCG/ACT nasal spray Place into the nose.    Taking  . Multiple Vitamins-Minerals (CENTRUM SILVER ULTRA MENS) TABS Take 1 tablet by mouth daily.   Past Week at Unknown time  . traZODone (DESYREL) 100 MG tablet Take 1 tablet (100 mg total) by mouth at  bedtime. 90 tablet 1 03/25/2016 at Unknown time  . vitamin E 400 UNIT capsule Take 400 Units by mouth daily.   Past Week at Unknown time     Allergies  Allergen Reactions  . Bee Venom Swelling    Bees     Past Medical History:  Diagnosis Date  . Abnormal laboratory test    Sees Dr. Ma Hillock  . Acid reflux   . Arthritis   . Chronic back pain   . Colon cancer ([place]) 11/1999   stage 3 s/p colon resection  . Depression   . History of chemotherapy    5-FU pump/leukovorin  . Insomnia   . Multiple myeloma ([place]) 12/2011   smoldering vs mgus  . Osteoporosis   . Seasonal allergies     Review of systems:      Physical Exam    Heart and lungs: Regular rate and rhythm without rub or gallop, lungs are bilaterally clear.    HEENT: Normocephalic atraumatic eyes are anicteric    Other:     Pertinant exam for procedure: Soft nontender nondistended bowel sounds positive normoactive.    Planned proceedures: Colonoscopy and indicated procedures. I have discussed the risks benefits and complications of procedures to include not limited to bleeding, infection, perforation and the risk of sedation and the patient wishes to proceed.    Lollie Sails, MD Gastroenterology 03/26/2016  10:52 AM

## 2016-03-26 NOTE — Transfer of Care (Signed)
Immediate Anesthesia Transfer of Care Note  Patient: [name]  Procedure(s) Performed: Procedure(s): COLONOSCOPY WITH PROPOFOL (N/A)  Patient Location: Endoscopy Unit  Anesthesia Type:General  Level of Consciousness: awake and alert   Airway & Oxygen Therapy: Patient Spontanous Breathing and Patient connected to nasal cannula oxygen  Post-op Assessment: Report given to RN and Post -op Vital signs reviewed and stable  Post vital signs: Reviewed and stable  Last Vitals:  Vitals:   03/26/16 0924  BP: (!) 159/93  Pulse: 91  Resp: 18  Temp: 37.2 C    Last Pain:  Vitals:   03/26/16 0924  TempSrc: Tympanic  PainSc: 7          Complications: No apparent anesthesia complications

## 2016-03-26 NOTE — Op Note (Signed)
[facility] Gastroenterology Patient Name: [name] Procedure Date: 03/26/2016 10:43 AM MRN: [id number] Account #: [id number] Date of Birth: 08/20/1950 Admit Type: Outpatient Age: 66 Room: [facility] ENDO ROOM 3 Gender: Male Note Status: Finalized Procedure:            Colonoscopy Indications:          Personal history of malignant neoplasm of the colon Providers:            Lollie Sails, MD Referring MD:         No Local Md, MD (Referring MD) Medicines:            Monitored Anesthesia Care Complications:        No immediate complications. Procedure:            Pre-Anesthesia Assessment:                       - ASA Grade Assessment: II - A patient with mild                        systemic disease.                       After obtaining informed consent, the colonoscope was                        passed under direct vision. Throughout the procedure,                        the patient's blood pressure, pulse, and oxygen                        saturations were monitored continuously. The Olympus                        PCF-H180AL colonoscope ( S#: [id number] ) was introduced                        through the anus and advanced to the the cecum,                        identified by appendiceal orifice and ileocecal valve.                        The patient tolerated the procedure well. The quality                        of the bowel preparation was good. Findings:      Two sessile polyps were found in the recto-sigmoid colon. The polyps       were 1 to 2 mm in size. These polyps were removed with a cold biopsy       forceps. Resection and retrieval were complete.      The exam was otherwise normal throughout the examined colon.      The retroflexed view of the distal rectum and anal verge was normal and       showed no anal or rectal abnormalities.      The digital rectal exam was normal. Impression:           - Two 1 to 2 mm polyps at the recto-sigmoid colon,  removed with a cold biopsy forceps. Resected and                        retrieved.                       - The distal rectum and anal verge are normal on                        retroflexion view. Recommendation:       - Discharge patient to home.                       - Telephone GI clinic for pathology results in 1 week.                       - Advance diet as tolerated.                       - Await pathology results. Procedure Code(s):    --- Professional ---                       [phone], Colonoscopy, flexible; with biopsy, single or                        multiple Diagnosis Code(s):    --- Professional ---                       D12.7, Benign neoplasm of rectosigmoid junction                       Z85.038, Personal history of other malignant neoplasm                        of large intestine CPT copyright 2016 American Medical Association. All rights reserved. The codes documented in this report are preliminary and upon coder review may  be revised to meet current compliance requirements. Lollie Sails, MD 03/26/2016 11:19:55 AM This report has been signed electronically. Number of Addenda: 0 Note Initiated On: 03/26/2016 10:43 AM Scope Withdrawal Time: 0 hours 9 minutes 10 seconds  Total Procedure Duration: 0 hours 17 minutes 15 seconds       [facility]

## 2016-03-27 ENCOUNTER — Encounter: Payer: Self-pay | Admitting: Gastroenterology

## 2016-03-27 LAB — SURGICAL PATHOLOGY

## 2016-03-28 ENCOUNTER — Telehealth: Payer: Self-pay

## 2016-03-28 NOTE — Telephone Encounter (Signed)
I contacted this patient to inform him that his rx for dicyclomine has been denied. They wanted me to call to see if they will give [place] a specific reason. I told them I would and then we will try an appeal. Then I will call them back to let them know what has been said.  I called the Appeals Dept [facility]) and they asked for me to complete the appeal form then fax it with a copy of the last progress note to [phone].  Confirmation was received.

## 2016-04-02 ENCOUNTER — Telehealth: Payer: Self-pay

## 2016-04-02 DIAGNOSIS — R109 Unspecified abdominal pain: Secondary | ICD-10-CM

## 2016-04-02 NOTE — Telephone Encounter (Signed)
I contacted this patient to inform him that the request was still denied even after submitting an appeal and a copy of the last office visit note.  He said that they called him and sent him a letter letting him know and now he wants to know what other options are there regarding a new medication.  Please advise or feel free to contact patient

## 2016-04-03 NOTE — Telephone Encounter (Signed)
He should contact his insurance company to obtain a list of covered alternative medications and we can review with him in office.

## 2016-04-04 NOTE — Telephone Encounter (Signed)
I contacted [employer] on yesterday and spoke with a rep with the name [employer]. He told me that we had the wrong ID# for his prescriptions. I was given the [id number]. I then went online and did another PA with [employer]. This morning I got a fax stating that they could locate the patient in their system.  I contacted [employer] to re-appeal and they said that they will send [place] some forms to complete and fax back.

## 2016-04-09 ENCOUNTER — Ambulatory Visit (INDEPENDENT_AMBULATORY_CARE_PROVIDER_SITE_OTHER): Payer: Medicare Other | Admitting: Family Medicine

## 2016-04-09 ENCOUNTER — Encounter: Payer: Self-pay | Admitting: Family Medicine

## 2016-04-09 VITALS — BP 154/75 | HR 73 | Temp 98.4°F | Resp 16 | Ht 67.0 in | Wt 136.5 lb

## 2016-04-09 DIAGNOSIS — F411 Generalized anxiety disorder: Secondary | ICD-10-CM

## 2016-04-09 DIAGNOSIS — R109 Unspecified abdominal pain: Secondary | ICD-10-CM | POA: Diagnosis not present

## 2016-04-09 DIAGNOSIS — R030 Elevated blood-pressure reading, without diagnosis of hypertension: Secondary | ICD-10-CM | POA: Diagnosis not present

## 2016-04-09 MED ORDER — CLONAZEPAM 0.5 MG PO TABS: 0.5000 mg | ORAL_TABLET | Freq: Every day | ORAL | 2 refills | Status: DC | PRN

## 2016-04-09 NOTE — Progress Notes (Signed)
Name: [name]   MRN: [id number]    DOB: 09-08-1950   Date:04/09/2016       Progress Note  Subjective  Chief Complaint  Chief Complaint  Patient presents with  . Follow-up    3 mo  . Advice Only    Discuss medication no longer covered by insurance Bentyl  . Medication Refill    clonzapam    Anxiety  Presents for follow-up visit. Symptoms include depressed mood, insomnia, nervous/anxious behavior and restlessness. Patient reports no excessive worry, irritability or panic. The severity of symptoms is moderate and causing significant distress.     Pt. Is here to obtain refill for dicyclomine or any alternative that may be appropriate. He has frequent GI spasms described as cramping from the lower abdomen traveling into his diaphragm. He has to lay down on his right to stop them. In addition, he has occasional diarrhea, He has taken Hyoscyamine in the past which was not covered and then switched to Dicyclomine, which was working well for a few months but then his insurance company decided not to cover this medication any longer and sent them a denial letter with alternative  Medications including Polyethylene Glycol, Alosetron, Amitiza, etc, none of which are likely to be appropriate for his condition.   Patient has also noticed elevated blood pressure, first noticed at the time of colonoscopy 2 weeks ago, his blood pressure was elevated to 983J systolic, his blood pressure today is also elevated to 154/75, he has no history of Hypertension, has never been on a medication, has history of anxiety disorder and worries a lot.    Past Medical History:  Diagnosis Date  . Abnormal laboratory test    Sees Dr. Ma Hillock  . Acid reflux   . Arthritis   . Chronic back pain   . Colon cancer ([place]) 11/1999   stage 3 s/p colon resection  . Depression   . History of chemotherapy    5-FU pump/leukovorin  . Insomnia   . Multiple myeloma ([place]) 12/2011   smoldering vs mgus  . Osteoporosis   .  Seasonal allergies     Past Surgical History:  Procedure Laterality Date  . COLON SURGERY     resection, 2nd surgery for scar tissue removal  . COLONOSCOPY    . COLONOSCOPY WITH PROPOFOL N/A 03/26/2016   Procedure: COLONOSCOPY WITH PROPOFOL;  Surgeon: Lollie Sails, MD;  Location: [facility] ENDOSCOPY;  Service: Endoscopy;  Laterality: N/A;  . ESOPHAGOGASTRODUODENOSCOPY  10/2010  . LUMBAR FUSION     L4-L5, L5-S1  . SACROILIAC JOINT FUSION    . SHOULDER SURGERY Left   . SPINAL CORD STIMULATOR IMPLANT      Family History  Problem Relation Age of Onset  . Lymphoma Mother   . Lung cancer Father   . Heart disease      grandparents    Social History   Social History  . Marital status: Married    Spouse name: N/A  . Number of children: N/A  . Years of education: N/A   Occupational History  . Not on file.   Social History Main Topics  . Smoking status: Former Research scientist (life sciences)  . Smokeless tobacco: Never Used     Comment: as a teenager, stopped at age 53  . Alcohol use No     Comment: Stopped tuesday.   . Drug use: No  . Sexual activity: Not on file   Other Topics Concern  . Not on file   Social History  Narrative  . No narrative on file     Current Outpatient Prescriptions:  .  celecoxib (CELEBREX) 200 MG capsule, TAKE ONE CAPSULE BY MOUTH ONCE DAILY, Disp: 30 capsule, Rfl: 5 .  Cholecalciferol (VITAMIN D) 2000 UNITS CAPS, Take 1 capsule by mouth daily., Disp: , Rfl:  .  clonazePAM (KLONOPIN) 0.5 MG tablet, Take 1 tablet (0.5 mg total) by mouth daily as needed for anxiety., Disp: 30 tablet, Rfl: 2 .  dicyclomine (BENTYL) 20 MG tablet, TAKE ONE-HALF TABLET BY MOUTH EVERY 6 HOURS, Disp: 180 tablet, Rfl: 0 .  esomeprazole (NEXIUM 24HR) 20 MG capsule, Take by mouth., Disp: , Rfl:  .  fluticasone (FLONASE) 50 MCG/ACT nasal spray, Place into the nose. , Disp: , Rfl:  .  Multiple Vitamins-Minerals (CENTRUM SILVER ULTRA MENS) TABS, Take 1 tablet by mouth daily., Disp: , Rfl:  .   traZODone (DESYREL) 100 MG tablet, Take 1 tablet (100 mg total) by mouth at bedtime., Disp: 90 tablet, Rfl: 1 .  vitamin E 400 UNIT capsule, Take 400 Units by mouth daily., Disp: , Rfl:   Allergies  Allergen Reactions  . Bee Venom Swelling    Bees Bees     Review of Systems  Constitutional: Negative for irritability.  Psychiatric/Behavioral: The patient is nervous/anxious and has insomnia.    Please see history of present illness for complete discussion of ROS  Objective  Vitals:   04/09/16 1341  BP: (!) 154/75  Pulse: 73  Resp: 16  Temp: 98.4 F (36.9 C)  TempSrc: Oral  SpO2: 96%  Weight: 136 lb 8 oz (61.9 kg)  Height: 5' 7" (1.702 m)    Physical Exam  Constitutional: He is oriented to person, place, and time and well-developed, well-nourished, and in no distress.  HENT:  Head: Normocephalic and atraumatic.  Cardiovascular: Normal rate, regular rhythm and normal heart sounds.   No murmur heard. Pulmonary/Chest: Effort normal and breath sounds normal.  Abdominal: Soft. Bowel sounds are normal.  Neurological: He is alert and oriented to person, place, and time.  Psychiatric: Mood, memory, affect and judgment normal.  Nursing note and vitals reviewed.    Assessment & Plan  1. Generalized anxiety disorder Stable and responsive to clonazepam taken daily as needed. Refills provided - clonazePAM (KLONOPIN) 0.5 MG tablet; Take 1 tablet (0.5 mg total) by mouth daily as needed for anxiety.  Dispense: 30 tablet; Refill: 2  2. Abdominal spasms We'll try to file an appeal on patient's behalf for approval of dicyclomine, none of the alternatives suggested by the insurance company are appropriate for this patient. She verbalized agreement with plan  3. Elevated blood pressure reading Advised to check blood sugar at bedtime, record in logs and review in one month.   Binnie Droessler [facility] 04/09/2016 2:17 PM

## 2016-04-19 NOTE — Telephone Encounter (Signed)
Patient was informed that the final decision of appeals ([place] Medicare Part D) for his Dicyclomine 20mg  was denied et again.   Patient stated that he was tired of dealing with it and chooses to pay for it out of pocket because this medication actually works and he does not want to change. He stated that he is good on pills for now but will need to a new rx soon.  I told him Dr. Manuella Ghazi was out of the office today and tomorrow but that I will send him a message explaining the situation.   He said ok and thanks for all of our help and time on this matter.  FYI: Patient prefers the capsule form so he can use GoodRx savings program and get a 90 day supply for $4.00.

## 2016-04-19 NOTE — Telephone Encounter (Signed)
Reviewed; will leave for Dr. Manuella Ghazi to address when he returns

## 2016-04-20 NOTE — Telephone Encounter (Signed)
Patient called back stating that the medication should be written at   Dicyclomine 10mg  capsules #60 so that it will be covered by [employer]Rx for only $4.  Please send in new rx

## 2016-04-23 MED ORDER — DICYCLOMINE HCL 10 MG PO CAPS: 10.0000 mg | ORAL_CAPSULE | Freq: Two times a day (BID) | ORAL | 0 refills | Status: DC

## 2016-04-23 NOTE — Telephone Encounter (Signed)
Prescription for dicyclomine 10 mg capsules to be taken 1 twice daily when needed is sent to patient's pharmacy.

## 2016-04-24 NOTE — Telephone Encounter (Signed)
Patient's wife was informed.

## 2016-05-09 ENCOUNTER — Encounter: Payer: Self-pay | Admitting: Family Medicine

## 2016-05-09 ENCOUNTER — Ambulatory Visit (INDEPENDENT_AMBULATORY_CARE_PROVIDER_SITE_OTHER): Payer: Medicare Other | Admitting: Family Medicine

## 2016-05-09 VITALS — BP 114/72 | HR 73 | Temp 98.5°F | Resp 16 | Ht 67.0 in | Wt 130.5 lb

## 2016-05-09 DIAGNOSIS — Z0130 Encounter for examination of blood pressure without abnormal findings: Secondary | ICD-10-CM

## 2016-05-09 NOTE — Progress Notes (Signed)
Name: [name]   MRN: [id number]    DOB: [birth year]   Date:05/09/2016       Progress Note  Subjective  Chief Complaint  Chief Complaint  Patient presents with  . Hypertension    HPI  Pt. Presents for follow up of elevated blood pressure, his blood pressure was elevated into the 557 s mmHg systolic, he feels otherwise well but his logs show persistently elevated blood pressure into the high 140s to low 150s.  His blood pressure is normal in office today.   Past Medical History:  Diagnosis Date  . Abnormal laboratory test    Sees Dr. Ma Hillock  . Acid reflux   . Arthritis   . Chronic back pain   . Colon cancer ([place]) 11/1999   stage 3 s/p colon resection  . Depression   . History of chemotherapy    5-FU pump/leukovorin  . Insomnia   . Multiple myeloma ([place]) 12/2011   smoldering vs mgus  . Osteoporosis   . Seasonal allergies     Past Surgical History:  Procedure Laterality Date  . COLON SURGERY     resection, 2nd surgery for scar tissue removal  . COLONOSCOPY    . COLONOSCOPY WITH PROPOFOL N/A 03/26/2016   Procedure: COLONOSCOPY WITH PROPOFOL;  Surgeon: Lollie Sails, MD;  Location: [facility] ENDOSCOPY;  Service: Endoscopy;  Laterality: N/A;  . ESOPHAGOGASTRODUODENOSCOPY  10/2010  . LUMBAR FUSION     L4-L5, L5-S1  . SACROILIAC JOINT FUSION    . SHOULDER SURGERY Left   . SPINAL CORD STIMULATOR IMPLANT      Family History  Problem Relation Age of Onset  . Lymphoma Mother   . Lung cancer Father   . Heart disease      grandparents    Social History   Social History  . Marital status: Married    Spouse name: N/A  . Number of children: N/A  . Years of education: N/A   Occupational History  . Not on file.   Social History Main Topics  . Smoking status: Former Research scientist (life sciences)  . Smokeless tobacco: Never Used     Comment: as a teenager, stopped at age 66  . Alcohol use No     Comment: Stopped tuesday.   . Drug use: No  . Sexual activity: Not on file   Other Topics  Concern  . Not on file   Social History Narrative  . No narrative on file     Current Outpatient Prescriptions:  .  celecoxib (CELEBREX) 200 MG capsule, TAKE ONE CAPSULE BY MOUTH ONCE DAILY, Disp: 30 capsule, Rfl: 5 .  Cholecalciferol (VITAMIN D) 2000 UNITS CAPS, Take 1 capsule by mouth daily., Disp: , Rfl:  .  clonazePAM (KLONOPIN) 0.5 MG tablet, Take 1 tablet (0.5 mg total) by mouth daily as needed for anxiety., Disp: 30 tablet, Rfl: 2 .  dicyclomine (BENTYL) 10 MG capsule, Take 1 capsule (10 mg total) by mouth 2 (two) times daily with a meal., Disp: 60 capsule, Rfl: 0 .  Multiple Vitamins-Minerals (CENTRUM SILVER ULTRA MENS) TABS, Take 1 tablet by mouth daily., Disp: , Rfl:  .  traZODone (DESYREL) 100 MG tablet, Take 1 tablet (100 mg total) by mouth at bedtime., Disp: 90 tablet, Rfl: 1 .  vitamin E 400 UNIT capsule, Take 400 Units by mouth daily., Disp: , Rfl:  .  esomeprazole (NEXIUM 24HR) 20 MG capsule, Take by mouth., Disp: , Rfl:   Allergies  Allergen Reactions  . Bee  Venom Swelling    Bees Bees     Review of Systems  Constitutional: Positive for malaise/fatigue (occasional fatigue). Negative for chills and fever.  Respiratory: Negative for cough and shortness of breath.   Cardiovascular: Negative for chest pain.  Neurological: Positive for headaches.      Objective  Vitals:   05/09/16 0958  BP: 114/72  Pulse: 73  Resp: 16  Temp: 98.5 F (36.9 C)  TempSrc: Oral  SpO2: 97%  Weight: 130 lb 8 oz (59.2 kg)  Height: 5' 7"  (1.702 m)    Physical Exam  Constitutional: He is oriented to person, place, and time and well-developed, well-nourished, and in no distress.  Cardiovascular: Normal rate, regular rhythm and normal heart sounds.   No murmur heard. Pulmonary/Chest: Effort normal and breath sounds normal. He has no wheezes.  Neurological: He is alert and oriented to person, place, and time.  Psychiatric: Mood, memory, affect and judgment normal.  Nursing note  and vitals reviewed.    Assessment & Plan  1. Blood pressure check Normotensive in office, blood pressure logs show some high readings especially towards the end of last month, advised to buy a new blood pressure cuff and compare with the manual cuff used for blood pressure measurement in office today. Patient will schedule a nurse visit   Anchor Dwan [facility] 05/09/2016 10:27 AM

## 2016-05-26 ENCOUNTER — Other Ambulatory Visit: Payer: Self-pay | Admitting: Family Medicine

## 2016-05-26 DIAGNOSIS — R109 Unspecified abdominal pain: Secondary | ICD-10-CM

## 2016-05-26 NOTE — Telephone Encounter (Signed)
Associated office note reviewed; Rx approved

## 2016-07-03 ENCOUNTER — Other Ambulatory Visit: Payer: Medicare Other

## 2016-07-04 ENCOUNTER — Inpatient Hospital Stay: Payer: Medicare Other | Attending: Internal Medicine

## 2016-07-04 DIAGNOSIS — C9000 Multiple myeloma not having achieved remission: Secondary | ICD-10-CM

## 2016-07-04 DIAGNOSIS — Z79899 Other long term (current) drug therapy: Secondary | ICD-10-CM | POA: Insufficient documentation

## 2016-07-04 DIAGNOSIS — G4700 Insomnia, unspecified: Secondary | ICD-10-CM | POA: Insufficient documentation

## 2016-07-04 DIAGNOSIS — K219 Gastro-esophageal reflux disease without esophagitis: Secondary | ICD-10-CM | POA: Diagnosis not present

## 2016-07-04 DIAGNOSIS — Z85038 Personal history of other malignant neoplasm of large intestine: Secondary | ICD-10-CM | POA: Insufficient documentation

## 2016-07-04 DIAGNOSIS — Z9221 Personal history of antineoplastic chemotherapy: Secondary | ICD-10-CM | POA: Diagnosis not present

## 2016-07-04 DIAGNOSIS — F329 Major depressive disorder, single episode, unspecified: Secondary | ICD-10-CM | POA: Diagnosis not present

## 2016-07-04 DIAGNOSIS — C9001 Multiple myeloma in remission: Secondary | ICD-10-CM | POA: Diagnosis not present

## 2016-07-04 DIAGNOSIS — Z87891 Personal history of nicotine dependence: Secondary | ICD-10-CM | POA: Diagnosis not present

## 2016-07-04 DIAGNOSIS — D472 Monoclonal gammopathy: Secondary | ICD-10-CM

## 2016-07-04 LAB — CBC WITH DIFFERENTIAL/PLATELET
BASOS PCT: 0 %
Basophils Absolute: 0 10*3/uL (ref 0–0.1)
Eosinophils Absolute: 0.2 10*3/uL (ref 0–0.7)
Eosinophils Relative: 3 %
HEMATOCRIT: 40.6 % (ref 40.0–52.0)
Hemoglobin: 14.5 g/dL (ref 13.0–18.0)
Lymphocytes Relative: 29 %
Lymphs Abs: 1.6 10*3/uL (ref 1.0–3.6)
MCH: 33.7 pg (ref 26.0–34.0)
MCHC: 35.7 g/dL (ref 32.0–36.0)
MCV: 94.3 fL (ref 80.0–100.0)
MONO ABS: 0.4 10*3/uL (ref 0.2–1.0)
MONOS PCT: 7 %
NEUTROS ABS: 3.4 10*3/uL (ref 1.4–6.5)
Neutrophils Relative %: 61 %
PLATELETS: 333 10*3/uL (ref 150–440)
RBC: 4.3 MIL/uL — ABNORMAL LOW (ref 4.40–5.90)
RDW: 12.9 % (ref 11.5–14.5)
WBC: 5.6 10*3/uL (ref 3.8–10.6)

## 2016-07-04 LAB — COMPREHENSIVE METABOLIC PANEL
ALBUMIN: 4.1 g/dL (ref 3.5–5.0)
ALT: 11 U/L — ABNORMAL LOW (ref 17–63)
ANION GAP: 4 — AB (ref 5–15)
AST: 21 U/L (ref 15–41)
Alkaline Phosphatase: 62 U/L (ref 38–126)
BILIRUBIN TOTAL: 0.7 mg/dL (ref 0.3–1.2)
BUN: 17 mg/dL (ref 6–20)
CO2: 29 mmol/L (ref 22–32)
Calcium: 9.2 mg/dL (ref 8.9–10.3)
Chloride: 105 mmol/L (ref 101–111)
Creatinine, Ser: 1.46 mg/dL — ABNORMAL HIGH (ref 0.61–1.24)
GFR calc Af Amer: 56 mL/min — ABNORMAL LOW (ref >60)
GFR, EST NON AFRICAN AMERICAN: 48 mL/min — AB (ref >60)
Glucose, Bld: 110 mg/dL — ABNORMAL HIGH (ref 65–99)
POTASSIUM: 4.6 mmol/L (ref 3.5–5.1)
Sodium: 138 mmol/L (ref 135–145)
TOTAL PROTEIN: 6.8 g/dL (ref 6.5–8.1)

## 2016-07-05 LAB — MULTIPLE MYELOMA PANEL, SERUM
ALBUMIN/GLOB SERPL: 1.4 (ref 0.7–1.7)
ALPHA2 GLOB SERPL ELPH-MCNC: 0.6 g/dL (ref 0.4–1.0)
Albumin SerPl Elph-Mcnc: 3.6 g/dL (ref 2.9–4.4)
Alpha 1: 0.2 g/dL (ref 0.0–0.4)
B-Globulin SerPl Elph-Mcnc: 0.9 g/dL (ref 0.7–1.3)
Gamma Glob SerPl Elph-Mcnc: 1 g/dL (ref 0.4–1.8)
Globulin, Total: 2.7 g/dL (ref 2.2–3.9)
IGA: 387 mg/dL (ref 61–437)
IGG (IMMUNOGLOBIN G), SERUM: 633 mg/dL — AB (ref 700–1600)
IGM, SERUM: 55 mg/dL (ref 20–172)
M Protein SerPl Elph-Mcnc: 0.4 g/dL — ABNORMAL HIGH
Total Protein ELP: 6.3 g/dL (ref 6.0–8.5)

## 2016-07-05 LAB — KAPPA/LAMBDA LIGHT CHAINS
Kappa free light chain: 123.7 mg/L — ABNORMAL HIGH (ref 3.3–19.4)
Kappa, lambda light chain ratio: 8.84 — ABNORMAL HIGH (ref 0.26–1.65)
Lambda free light chains: 14 mg/L (ref 5.7–26.3)

## 2016-07-06 ENCOUNTER — Other Ambulatory Visit: Payer: Self-pay | Admitting: Family Medicine

## 2016-07-06 DIAGNOSIS — R109 Unspecified abdominal pain: Secondary | ICD-10-CM

## 2016-07-07 ENCOUNTER — Other Ambulatory Visit: Payer: Self-pay | Admitting: Family Medicine

## 2016-07-07 DIAGNOSIS — G4700 Insomnia, unspecified: Secondary | ICD-10-CM

## 2016-07-09 ENCOUNTER — Inpatient Hospital Stay: Payer: Medicare Other | Admitting: Internal Medicine

## 2016-07-09 ENCOUNTER — Inpatient Hospital Stay: Payer: Medicare Other | Attending: Oncology | Admitting: Oncology

## 2016-07-09 VITALS — BP 113/67 | HR 62 | Temp 98.7°F | Wt 131.2 lb

## 2016-07-09 DIAGNOSIS — C9000 Multiple myeloma not having achieved remission: Secondary | ICD-10-CM | POA: Diagnosis not present

## 2016-07-09 DIAGNOSIS — Z85038 Personal history of other malignant neoplasm of large intestine: Secondary | ICD-10-CM | POA: Diagnosis not present

## 2016-07-09 DIAGNOSIS — D472 Monoclonal gammopathy: Secondary | ICD-10-CM

## 2016-07-09 DIAGNOSIS — Z9049 Acquired absence of other specified parts of digestive tract: Secondary | ICD-10-CM | POA: Insufficient documentation

## 2016-07-09 DIAGNOSIS — Z87891 Personal history of nicotine dependence: Secondary | ICD-10-CM | POA: Insufficient documentation

## 2016-07-09 DIAGNOSIS — K219 Gastro-esophageal reflux disease without esophagitis: Secondary | ICD-10-CM | POA: Insufficient documentation

## 2016-07-09 DIAGNOSIS — F329 Major depressive disorder, single episode, unspecified: Secondary | ICD-10-CM | POA: Insufficient documentation

## 2016-07-09 DIAGNOSIS — G4700 Insomnia, unspecified: Secondary | ICD-10-CM | POA: Diagnosis not present

## 2016-07-09 DIAGNOSIS — Z79899 Other long term (current) drug therapy: Secondary | ICD-10-CM | POA: Diagnosis not present

## 2016-07-09 DIAGNOSIS — R944 Abnormal results of kidney function studies: Secondary | ICD-10-CM

## 2016-07-09 NOTE — Progress Notes (Signed)
Patient here today for follow up.   

## 2016-07-09 NOTE — Progress Notes (Signed)
[place]  [phone] [phone]  ID: [name] OB: May 06, 1950  MR#: [id number]  OMV#:[id number]  Patient Care Team: Roselee Nova, MD as PCP - General (Family Medicine) Cammie Sickle, MD as Consulting Physician (Internal Medicine)  CHIEF COMPLAINT: Smoldering multiple myeloma (SMM) [facility])  INTERVAL HISTORY: Patient returns to clinic today for repeat laboratory work and routine 6 month evaluation. He currently feels well and is asymptomatic. He has no neurologic complaints. He denies any recent fevers or illnesses. He continues to have chronic back pain which is unchanged. He denies any other pain. He has good appetite and denies weight loss. He has no chest pain or shortness of breath. He denies any nausea, vomiting, constipation, or diarrhea. He has no urinary complaints. Patient feels at his baseline and offers no further specific complaints today.   REVIEW OF SYSTEMS:   Review of Systems  Constitutional: Negative.  Negative for fever, malaise/fatigue and weight loss.  Respiratory: Negative.  Negative for cough and shortness of breath.   Cardiovascular: Negative.  Negative for chest pain and leg swelling.  Gastrointestinal: Negative.  Negative for abdominal pain.  Genitourinary: Negative.   Musculoskeletal: Positive for back pain.  Skin: Negative.  Negative for rash.  Neurological: Negative.  Negative for sensory change and weakness.  Psychiatric/Behavioral: Negative.  The patient is not nervous/anxious.     As per HPI. Otherwise, a complete review of systems is negative.  PAST MEDICAL HISTORY: Past Medical History:  Diagnosis Date  . Abnormal laboratory test    Sees Dr. Ma Hillock  . Acid reflux   . Arthritis   . Chronic back pain   . Colon cancer ([place]) 11/1999   stage 3 s/p colon resection  . Depression   . History of chemotherapy    5-FU pump/leukovorin  . Insomnia   . Multiple myeloma ([place]) 12/2011   smoldering vs  mgus  . Osteoporosis   . Seasonal allergies     PAST SURGICAL HISTORY: Past Surgical History:  Procedure Laterality Date  . COLON SURGERY     resection, 2nd surgery for scar tissue removal  . COLONOSCOPY    . COLONOSCOPY WITH PROPOFOL N/A 03/26/2016   Procedure: COLONOSCOPY WITH PROPOFOL;  Surgeon: Lollie Sails, MD;  Location: [facility] ENDOSCOPY;  Service: Endoscopy;  Laterality: N/A;  . ESOPHAGOGASTRODUODENOSCOPY  10/2010  . LUMBAR FUSION     L4-L5, L5-S1  . SACROILIAC JOINT FUSION    . SHOULDER SURGERY Left   . SPINAL CORD STIMULATOR IMPLANT      FAMILY HISTORY: Family History  Problem Relation Age of Onset  . Lymphoma Mother   . Lung cancer Father   . Heart disease Unknown        grandparents    ADVANCED DIRECTIVES (Y/N):  N  HEALTH MAINTENANCE: Social History  Substance Use Topics  . Smoking status: Former Research scientist (life sciences)  . Smokeless tobacco: Never Used     Comment: as a teenager, stopped at age 5  . Alcohol use No     Comment: Stopped tuesday.      Colonoscopy:  PAP:  Bone density:  Lipid panel:  Allergies  Allergen Reactions  . Bee Venom Swelling    Bees Bees    Current Outpatient Prescriptions  Medication Sig Dispense Refill  . celecoxib (CELEBREX) 200 MG capsule TAKE ONE CAPSULE BY MOUTH ONCE DAILY 30 capsule 5  . Cholecalciferol (VITAMIN D) 2000 UNITS CAPS Take 1 capsule by mouth daily.    Marland Kitchen  clonazePAM (KLONOPIN) 0.5 MG tablet Take 1 tablet (0.5 mg total) by mouth daily as needed for anxiety. 30 tablet 2  . dicyclomine (BENTYL) 10 MG capsule TAKE 1 CAPSULE BY MOUTH TWICE DAILY WITH MEALS 60 capsule 0  . esomeprazole (NEXIUM 24HR) 20 MG capsule Take by mouth.    . Multiple Vitamins-Minerals (CENTRUM SILVER ULTRA MENS) TABS Take 1 tablet by mouth daily.    . traZODone (DESYREL) 100 MG tablet Take 1 tablet (100 mg total) by mouth at bedtime. 90 tablet 1   No current facility-administered medications for this visit.     OBJECTIVE: Vitals:   07/09/16  1551  BP: 113/67  Pulse: 62  Temp: 98.7 F (37.1 C)     Body mass index is 20.55 kg/m.    ECOG FS:0 - Asymptomatic  General: Well-developed, well-nourished, no acute distress. Eyes: Pink conjunctiva, anicteric sclera. Lungs: Clear to auscultation bilaterally. Heart: Regular rate and rhythm. No rubs, murmurs, or gallops. Abdomen: Soft, nontender, nondistended. No organomegaly noted, normoactive bowel sounds. Musculoskeletal: No edema, cyanosis, or clubbing. Neuro: Alert, answering all questions appropriately. Cranial nerves grossly intact. Skin: No rashes or petechiae noted. Psych: Normal affect.   LAB RESULTS:  Lab Results  Component Value Date   NA 138 07/04/2016   K 4.6 07/04/2016   CL 105 07/04/2016   CO2 29 07/04/2016   GLUCOSE 110 (H) 07/04/2016   BUN 17 07/04/2016   CREATININE 1.46 (H) 07/04/2016   CALCIUM 9.2 07/04/2016   PROT 6.8 07/04/2016   ALBUMIN 4.1 07/04/2016   AST 21 07/04/2016   ALT 11 (L) 07/04/2016   ALKPHOS 62 07/04/2016   BILITOT 0.7 07/04/2016   GFRNONAA 48 (L) 07/04/2016   GFRAA 56 (L) 07/04/2016    Lab Results  Component Value Date   WBC 5.6 07/04/2016   NEUTROABS 3.4 07/04/2016   HGB 14.5 07/04/2016   HCT 40.6 07/04/2016   MCV 94.3 07/04/2016   PLT 333 07/04/2016   Lab Results  Component Value Date   TOTALPROTELP 6.3 07/04/2016   ALBUMINELP 3.8 01/03/2015   A1GS 0.1 01/03/2015   A2GS 0.5 01/03/2015   BETS 0.8 01/03/2015   GAMS 0.8 01/03/2015   MSPIKE 0.3 (H) 01/03/2015   SPEI Comment 01/03/2015     STUDIES: No results found.  ASSESSMENT & PLAN:   Smoldering multiple myeloma (SMM) ([place])  # SMOLDERING MULTIPLE MYELOMA [2013 s/p BMBx]. Clinically, patient does not seem to have progressed into active myeloma. He is no evidence of endorgan damage. CBC and calcium are within normal. Creatinine is mildly elevated at 1.46. His M spike remains unchanged at 0.3. Kappa free light chains continue to be mildly elevated, but relatively  stable at 123.7. No intervention is needed at this time. Return to clinic in 6 months with repeat laboratory work and further evaluation.  # Elevated creatinine: Mild, monitor. No intervention needed at this time.  # Hx of colon cancer: continue colonoscopies per GI.   Patient expressed understanding and was in agreement with this plan. He also understands that He can call clinic at any time with any questions, concerns, or complaints.    Lloyd Huger, MD   07/15/2016 8:50 AM

## 2016-07-19 ENCOUNTER — Telehealth: Payer: Self-pay | Admitting: Family Medicine

## 2016-07-19 NOTE — Telephone Encounter (Signed)
PT NEEDS REFILL ON HIS TRAZODONE TO [facility] ON [address]. SAYS HE NEEDS 3 M SUPPLY. HE ALSO HAS APPT IN [place]

## 2016-07-25 ENCOUNTER — Other Ambulatory Visit: Payer: Self-pay | Admitting: Emergency Medicine

## 2016-07-25 DIAGNOSIS — F5101 Primary insomnia: Secondary | ICD-10-CM

## 2016-07-25 MED ORDER — TRAZODONE HCL 100 MG PO TABS: 100.0000 mg | ORAL_TABLET | Freq: Every day | ORAL | 0 refills | Status: DC

## 2016-07-25 NOTE — Telephone Encounter (Signed)
Per Dr. Manuella Ghazi, may have a 1 month supply then must be seen

## 2016-08-06 ENCOUNTER — Other Ambulatory Visit: Payer: Self-pay | Admitting: Family Medicine

## 2016-08-06 DIAGNOSIS — F411 Generalized anxiety disorder: Secondary | ICD-10-CM

## 2016-08-07 ENCOUNTER — Other Ambulatory Visit: Payer: Self-pay

## 2016-08-07 DIAGNOSIS — F411 Generalized anxiety disorder: Secondary | ICD-10-CM

## 2016-08-07 MED ORDER — CLONAZEPAM 0.5 MG PO TABS: ORAL_TABLET | ORAL | 2 refills | Status: DC

## 2016-08-09 ENCOUNTER — Other Ambulatory Visit: Payer: Self-pay | Admitting: Family Medicine

## 2016-08-09 DIAGNOSIS — R109 Unspecified abdominal pain: Secondary | ICD-10-CM

## 2016-08-10 ENCOUNTER — Encounter: Payer: Self-pay | Admitting: Family Medicine

## 2016-08-10 ENCOUNTER — Ambulatory Visit (INDEPENDENT_AMBULATORY_CARE_PROVIDER_SITE_OTHER): Payer: Medicare Other | Admitting: Family Medicine

## 2016-08-10 VITALS — BP 124/82 | HR 84 | Temp 98.8°F | Resp 16 | Ht 67.0 in | Wt 128.7 lb

## 2016-08-10 DIAGNOSIS — R109 Unspecified abdominal pain: Secondary | ICD-10-CM | POA: Diagnosis not present

## 2016-08-10 DIAGNOSIS — W57XXXA Bitten or stung by nonvenomous insect and other nonvenomous arthropods, initial encounter: Secondary | ICD-10-CM | POA: Diagnosis not present

## 2016-08-10 DIAGNOSIS — S40262A Insect bite (nonvenomous) of left shoulder, initial encounter: Secondary | ICD-10-CM | POA: Diagnosis not present

## 2016-08-10 MED ORDER — DICYCLOMINE HCL 10 MG PO CAPS: ORAL_CAPSULE | ORAL | 1 refills | Status: DC

## 2016-08-10 MED ORDER — MUPIROCIN CALCIUM 2 % EX CREA: 1.0000 "application " | TOPICAL_CREAM | Freq: Two times a day (BID) | CUTANEOUS | 0 refills | Status: DC

## 2016-08-10 NOTE — Progress Notes (Addendum)
Name: [name]   MRN: [id number]    DOB: Mar 04, 1950   Date:08/10/2016       Progress Note  Subjective  Chief Complaint  Chief Complaint  Patient presents with  . Medication Refill    HPI   Pt presents to obtain refill on bentyl. He has been on this medication for abdominal spasms for about 7 months now. He takes one in the mid-morning and in the evening and this really helps him. Spasms not gone completely, but significantly dulls them. He has had colon resection, scar tissue removal from the abdomen, etc. Spasms become overwhelming without the medication.  Has issues with insurance coverage, but is able to obtain with [facility] for $3.  No NVD/constipation, no fevers/chills.  Tick Bite: States had tick bite 3-4 weeks ago, was able to completely remove, but area is still red. No body aches, headaches, fevers/chills, chest pain, shortness of breath, or rash. Area is still red, raised, and painful.  Has applied Campho-Phenique and Alcohol to the site to clean.  Patient Active Problem List   Diagnosis Date Noted  . Abdominal spasms 01/16/2016  . Smoldering multiple myeloma (SMM) ([place]) 01/09/2016  . Arthritis 05/18/2015  . Hyperglycemia 02/16/2015  . Anxiety disorder 10/04/2014  . Allergic rhinitis 10/04/2014  . Acid reflux 10/04/2014  . Insomnia 09/06/2014    Social History  Substance Use Topics  . Smoking status: Former Research scientist (life sciences)  . Smokeless tobacco: Never Used     Comment: as a teenager, stopped at age 35  . Alcohol use No     Comment: Stopped tuesday.      Current Outpatient Prescriptions:  .  celecoxib (CELEBREX) 200 MG capsule, TAKE ONE CAPSULE BY MOUTH ONCE DAILY, Disp: 30 capsule, Rfl: 5 .  Cholecalciferol (VITAMIN D) 2000 UNITS CAPS, Take 1 capsule by mouth daily., Disp: , Rfl:  .  clonazePAM (KLONOPIN) 0.5 MG tablet, TAKE 1 TABLET BY MOUTH ONCE DAILY AS NEEDED FOR ANXIETY, Disp: 30 tablet, Rfl: 2 .  dicyclomine (BENTYL) 10 MG capsule, TAKE 1 CAPSULE BY MOUTH TWICE  DAILY WITH MEALS, Disp: 60 capsule, Rfl: 0 .  esomeprazole (NEXIUM 24HR) 20 MG capsule, Take by mouth., Disp: , Rfl:  .  Multiple Vitamins-Minerals (CENTRUM SILVER ULTRA MENS) TABS, Take 1 tablet by mouth daily., Disp: , Rfl:  .  traZODone (DESYREL) 100 MG tablet, Take 1 tablet (100 mg total) by mouth at bedtime. Please have patient follow up for refills, Disp: 30 tablet, Rfl: 0  Allergies  Allergen Reactions  . Bee Venom Swelling    Bees Bees    ROS  Constitutional: Negative for fever or weight change.  Respiratory: Negative for cough and shortness of breath.   Cardiovascular: Negative for chest pain or palpitations.  Gastrointestinal: See HPI, no bowel changes.  Musculoskeletal: Negative for gait problem or joint swelling.  Skin: Negative for rash.  Neurological: Negative for dizziness or headache.  No other specific complaints in a complete review of systems (except as listed in HPI above).  Objective  Vitals:   08/10/16 1430  BP: 124/82  Pulse: 84  Resp: 16  Temp: 98.8 F (37.1 C)  TempSrc: Oral  SpO2: 97%  Weight: 128 lb 11.2 oz (58.4 kg)  Height: 5' 7"  (1.702 m)   Body mass index is 20.16 kg/m.  Nursing Note and Vital Signs reviewed.  Physical Exam  Constitutional: Patient appears well-developed and well-nourished.  No distress.  HEENT: head atraumatic, normocephalic Cardiovascular: Normal rate, regular rhythm, S1/S2  present.  No murmur or rub heard. No BLE edema. Pulmonary/Chest: Effort normal and breath sounds clear. No respiratory distress or retractions. Abdominal: Soft and non-tender, bowel sounds present x4 quadrants. Psychiatric: Patient has a normal mood and affect. behavior is normal. Judgment and thought content normal. Skin: LEFT upper shoulder has 1cm diameter raised area with white center and underlying erythema, tender on palpation, no fluctuance, no drainage on palpation, no bleeding.  Recent Results (from the past 2160 hour(s))  Kappa/lambda  light chains     Status: Abnormal   Collection Time: 07/04/16 10:00 AM  Result Value Ref Range   Kappa free light chain 123.7 (H) 3.3 - 19.4 mg/L   Lamda free light chains 14.0 5.7 - 26.3 mg/L   Kappa, lamda light chain ratio 8.84 (H) 0.26 - 1.65    Comment: (NOTE) Performed At: [facility] [place], [place] [id number] Lindon Romp MD [id number]   Multiple Myeloma Panel (SPEP&IFE w/QIG)     Status: Abnormal   Collection Time: 07/04/16 10:00 AM  Result Value Ref Range   IgG (Immunoglobin G), Serum 633 (L) 700 - 1,600 mg/dL   IgA 387 61 - 437 mg/dL   IgM, Serum 55 20 - 172 mg/dL   Total Protein ELP 6.3 6.0 - 8.5 g/dL   Albumin SerPl Elph-Mcnc 3.6 2.9 - 4.4 g/dL   Alpha 1 0.2 0.0 - 0.4 g/dL   Alpha2 Glob SerPl Elph-Mcnc 0.6 0.4 - 1.0 g/dL   B-Globulin SerPl Elph-Mcnc 0.9 0.7 - 1.3 g/dL   Gamma Glob SerPl Elph-Mcnc 1.0 0.4 - 1.8 g/dL   M Protein SerPl Elph-Mcnc 0.4 (H) Not Observed g/dL   Globulin, Total 2.7 2.2 - 3.9 g/dL   Albumin/Glob SerPl 1.4 0.7 - 1.7   IFE 1 Comment     Comment: (NOTE) Immunofixation shows IgA monoclonal protein with kappa light chain specificity.    Please Note Comment     Comment: (NOTE) Protein electrophoresis scan will follow via computer, mail, or courier delivery. Performed At: [facility] [place], [place] [id number] Lindon Romp MD [id number]   CBC with Differential/Platelet     Status: Abnormal   Collection Time: 07/04/16 10:00 AM  Result Value Ref Range   WBC 5.6 3.8 - 10.6 K/uL   RBC 4.30 (L) 4.40 - 5.90 MIL/uL   Hemoglobin 14.5 13.0 - 18.0 g/dL   HCT 40.6 40.0 - 52.0 %   MCV 94.3 80.0 - 100.0 fL   MCH 33.7 26.0 - 34.0 pg   MCHC 35.7 32.0 - 36.0 g/dL   RDW 12.9 11.5 - 14.5 %   Platelets 333 150 - 440 K/uL   Neutrophils Relative % 61 %   Neutro Abs 3.4 1.4 - 6.5 K/uL   Lymphocytes Relative 29 %   Lymphs Abs 1.6 1.0 - 3.6 K/uL   Monocytes Relative 7 %   Monocytes Absolute  0.4 0.2 - 1.0 K/uL   Eosinophils Relative 3 %   Eosinophils Absolute 0.2 0 - 0.7 K/uL   Basophils Relative 0 %   Basophils Absolute 0.0 0 - 0.1 K/uL  Comprehensive metabolic panel     Status: Abnormal   Collection Time: 07/04/16 10:00 AM  Result Value Ref Range   Sodium 138 135 - 145 mmol/L   Potassium 4.6 3.5 - 5.1 mmol/L   Chloride 105 101 - 111 mmol/L   CO2 29 22 - 32 mmol/L   Glucose, Bld 110 (H) 65 - 99 mg/dL  BUN 17 6 - 20 mg/dL   Creatinine, Ser 1.46 (H) 0.61 - 1.24 mg/dL   Calcium 9.2 8.9 - 10.3 mg/dL   Total Protein 6.8 6.5 - 8.1 g/dL   Albumin 4.1 3.5 - 5.0 g/dL   AST 21 15 - 41 U/L   ALT 11 (L) 17 - 63 U/L   Alkaline Phosphatase 62 38 - 126 U/L   Total Bilirubin 0.7 0.3 - 1.2 mg/dL   GFR calc non Af Amer 48 (L) >60 mL/min   GFR calc Af Amer 56 (L) >60 mL/min    Comment: (NOTE) The eGFR has been calculated using the CKD EPI equation. This calculation has not been validated in all clinical situations. eGFR's persistently <60 mL/min signify possible Chronic Kidney Disease.    Anion gap 4 (L) 5 - 15     Assessment & Plan   1. Tick bite of left shoulder, initial encounter - mupirocin cream (BACTROBAN) 2 %; Apply 1 application topically 2 (two) times daily.  Dispense: 15 g; Refill: 0 - Discussed that due to lack of signs and symptoms of Lyme or other systemic infection, we will not treat with systemic antibiotics.  We will use topical treatment for now. Will reassess at follow up with Dr. Manuella Ghazi PCP.  2. Abdominal spasms - dicyclomine (BENTYL) 10 MG capsule; TAKE 1 CAPSULE BY MOUTH TWICE DAILY WITH MEALS  Dispense: 60 capsule; Refill: 1  - Follow up and care instructions discussed and provided in AVS.  I have reviewed this encounter including the documentation in this note and/or discussed this patient with the Johney Maine, FNP, NP-C. I am certifying that I agree with the content of this note as supervising physician.  Steele Sizer, MD [facility] 08/13/2016, 11:08 AM

## 2016-08-13 ENCOUNTER — Ambulatory Visit: Payer: Medicare Other | Admitting: Family Medicine

## 2016-08-14 ENCOUNTER — Encounter: Payer: Self-pay | Admitting: Family Medicine

## 2016-08-15 ENCOUNTER — Telehealth: Payer: Self-pay | Admitting: Family Medicine

## 2016-08-15 NOTE — Telephone Encounter (Signed)
Received denial for Bactroban Topical from [facility] - please call patient and ask how he is feeling, how is the area on his shoulder? If doing better, we can hold off on anything for now, if not doing better I will send something else in. Thank you!

## 2016-08-16 NOTE — Telephone Encounter (Signed)
Spoke to [place], this has been taken care of

## 2016-08-20 ENCOUNTER — Encounter: Payer: Self-pay | Admitting: Family Medicine

## 2016-08-20 ENCOUNTER — Ambulatory Visit (INDEPENDENT_AMBULATORY_CARE_PROVIDER_SITE_OTHER): Payer: Medicare Other | Admitting: Family Medicine

## 2016-08-20 VITALS — BP 126/76 | HR 62 | Temp 97.9°F | Resp 16 | Ht 67.0 in | Wt 130.7 lb

## 2016-08-20 DIAGNOSIS — M1990 Unspecified osteoarthritis, unspecified site: Secondary | ICD-10-CM

## 2016-08-20 MED ORDER — CELECOXIB 200 MG PO CAPS: 200.0000 mg | ORAL_CAPSULE | Freq: Every day | ORAL | 1 refills | Status: DC

## 2016-08-20 NOTE — Progress Notes (Signed)
Name: [name]   MRN: [id number]    DOB: May 05, 1950   Date:08/20/2016       Progress Note  Subjective  Chief Complaint  Chief Complaint  Patient presents with  . Follow-up    3 mo  . Medication Refill    HPI  Arthritis: lumbar spine, has been disgnosed with arthritis in lumbar spine, had surgery in 2005 for lumbar fusion, then removeal of hardware in 2007. Then he had the spinal cord stimulator planted in 2007. He takes Celebrex 200 mg every morning, this seems to help with low back pain, no side effects reported.    Past Medical History:  Diagnosis Date  . Abnormal laboratory test    Sees Dr. Ma Hillock  . Acid reflux   . Arthritis   . Chronic back pain   . Colon cancer ([place]) 11/1999   stage 3 s/p colon resection  . Depression   . History of chemotherapy    5-FU pump/leukovorin  . Insomnia   . Multiple myeloma ([place]) 12/2011   smoldering vs mgus  . Osteoporosis   . Seasonal allergies     Past Surgical History:  Procedure Laterality Date  . COLON SURGERY     resection, 2nd surgery for scar tissue removal  . COLONOSCOPY    . COLONOSCOPY WITH PROPOFOL N/A 03/26/2016   Procedure: COLONOSCOPY WITH PROPOFOL;  Surgeon: Lollie Sails, MD;  Location: [facility] ENDOSCOPY;  Service: Endoscopy;  Laterality: N/A;  . ESOPHAGOGASTRODUODENOSCOPY  10/2010  . LUMBAR FUSION     L4-L5, L5-S1  . SACROILIAC JOINT FUSION    . SHOULDER SURGERY Left   . SPINAL CORD STIMULATOR IMPLANT      Family History  Problem Relation Age of Onset  . Lymphoma Mother   . Lung cancer Father   . Heart disease Unknown        grandparents    Social History   Social History  . Marital status: Married    Spouse name: N/A  . Number of children: N/A  . Years of education: N/A   Occupational History  . Not on file.   Social History Main Topics  . Smoking status: Former Research scientist (life sciences)  . Smokeless tobacco: Never Used     Comment: as a teenager, stopped at age 80  . Alcohol use No     Comment: Stopped  tuesday.   . Drug use: No  . Sexual activity: Not on file   Other Topics Concern  . Not on file   Social History Narrative  . No narrative on file     Current Outpatient Prescriptions:  .  celecoxib (CELEBREX) 200 MG capsule, TAKE ONE CAPSULE BY MOUTH ONCE DAILY, Disp: 30 capsule, Rfl: 5 .  Cholecalciferol (VITAMIN D) 2000 UNITS CAPS, Take 1 capsule by mouth daily., Disp: , Rfl:  .  clonazePAM (KLONOPIN) 0.5 MG tablet, TAKE 1 TABLET BY MOUTH ONCE DAILY AS NEEDED FOR ANXIETY, Disp: 30 tablet, Rfl: 2 .  dicyclomine (BENTYL) 10 MG capsule, TAKE 1 CAPSULE BY MOUTH TWICE DAILY WITH MEALS, Disp: 60 capsule, Rfl: 1 .  esomeprazole (NEXIUM 24HR) 20 MG capsule, Take by mouth., Disp: , Rfl:  .  Multiple Vitamins-Minerals (CENTRUM SILVER ULTRA MENS) TABS, Take 1 tablet by mouth daily., Disp: , Rfl:  .  mupirocin ointment (BACTROBAN) 2 %, Apply 1 application topically 2 (two) times daily., Disp: , Rfl:  .  traZODone (DESYREL) 100 MG tablet, Take 1 tablet (100 mg total) by mouth at bedtime. Please  have patient follow up for refills, Disp: 30 tablet, Rfl: 0  Allergies  Allergen Reactions  . Bee Venom Swelling    Bees Bees     ROS  Please see history of present illness for complete discussion of ROS  Objective  Vitals:   08/20/16 1149  BP: 126/76  Pulse: 62  Resp: 16  Temp: 97.9 F (36.6 C)  TempSrc: Oral  SpO2: 97%  Weight: 130 lb 11.2 oz (59.3 kg)  Height: 5' 7"  (1.702 m)    Physical Exam  Constitutional: He is oriented to person, place, and time and well-developed, well-nourished, and in no distress.  Cardiovascular: Normal rate, regular rhythm and normal heart sounds.   No murmur heard. Pulmonary/Chest: Effort normal and breath sounds normal. He has no wheezes.  Musculoskeletal:       Lumbar back: He exhibits tenderness, pain and spasm.       Back:  Neurological: He is alert and oriented to person, place, and time.  Psychiatric: Mood, memory, affect and judgment  normal.  Nursing note and vitals reviewed.       Assessment & Plan  1. Arthritis Continue on Celebrex as prescribed, reviewed x-ray reports of the lumbar spine from 2014 - celecoxib (CELEBREX) 200 MG capsule; Take 1 capsule (200 mg total) by mouth daily.  Dispense: 90 capsule; Refill: 1   Careena Degraffenreid [facility] 08/20/2016 11:55 AM

## 2016-09-10 ENCOUNTER — Other Ambulatory Visit: Payer: Self-pay | Admitting: Family Medicine

## 2016-09-10 DIAGNOSIS — F5101 Primary insomnia: Secondary | ICD-10-CM

## 2016-10-09 ENCOUNTER — Other Ambulatory Visit: Payer: Self-pay | Admitting: Family Medicine

## 2016-10-09 DIAGNOSIS — R109 Unspecified abdominal pain: Secondary | ICD-10-CM

## 2016-10-24 ENCOUNTER — Other Ambulatory Visit: Payer: Self-pay | Admitting: Family Medicine

## 2016-10-24 DIAGNOSIS — F5101 Primary insomnia: Secondary | ICD-10-CM

## 2016-10-29 ENCOUNTER — Other Ambulatory Visit: Payer: Self-pay | Admitting: Family Medicine

## 2016-10-29 DIAGNOSIS — F411 Generalized anxiety disorder: Secondary | ICD-10-CM

## 2016-11-01 ENCOUNTER — Other Ambulatory Visit: Payer: Self-pay | Admitting: Family Medicine

## 2016-11-01 DIAGNOSIS — F411 Generalized anxiety disorder: Secondary | ICD-10-CM

## 2016-11-02 ENCOUNTER — Other Ambulatory Visit: Payer: Self-pay

## 2016-11-02 DIAGNOSIS — F411 Generalized anxiety disorder: Secondary | ICD-10-CM

## 2016-11-02 NOTE — Telephone Encounter (Signed)
Patient requesting refill of Clonazepam to [facility].

## 2016-11-02 NOTE — Telephone Encounter (Signed)
Pt is requesting a refill on clonazepam. He had a 3 month follow up scheduled for later in October, however due to you declining his refill request he did reschedule for 11/07/16 (your first availability). Pt is asking that you please give him enough to last until appt

## 2016-11-05 NOTE — Telephone Encounter (Signed)
Pt informed and will pick up today.

## 2016-11-07 ENCOUNTER — Encounter: Payer: Self-pay | Admitting: Family Medicine

## 2016-11-07 ENCOUNTER — Ambulatory Visit (INDEPENDENT_AMBULATORY_CARE_PROVIDER_SITE_OTHER): Payer: Medicare Other | Admitting: Family Medicine

## 2016-11-07 VITALS — BP 122/80 | HR 72 | Resp 96 | Ht 67.0 in | Wt 125.4 lb

## 2016-11-07 DIAGNOSIS — R109 Unspecified abdominal pain: Secondary | ICD-10-CM | POA: Diagnosis not present

## 2016-11-07 DIAGNOSIS — F5101 Primary insomnia: Secondary | ICD-10-CM | POA: Diagnosis not present

## 2016-11-07 DIAGNOSIS — M1990 Unspecified osteoarthritis, unspecified site: Secondary | ICD-10-CM

## 2016-11-07 DIAGNOSIS — Z23 Encounter for immunization: Secondary | ICD-10-CM | POA: Diagnosis not present

## 2016-11-07 MED ORDER — DICYCLOMINE HCL 10 MG PO CAPS: ORAL_CAPSULE | ORAL | 0 refills | Status: DC

## 2016-11-07 MED ORDER — TRAZODONE HCL 100 MG PO TABS: 100.0000 mg | ORAL_TABLET | Freq: Every evening | ORAL | 0 refills | Status: DC | PRN

## 2016-11-07 MED ORDER — CELECOXIB 200 MG PO CAPS: 200.0000 mg | ORAL_CAPSULE | Freq: Every day | ORAL | 1 refills | Status: DC

## 2016-11-07 NOTE — Progress Notes (Signed)
Name: [name]   MRN: [id number]    DOB: [birth year]   Date:11/07/2016       Progress Note  Subjective  Chief Complaint  Chief Complaint  Patient presents with  . Arthritis  . Hypertension  . Hearing Problem    Got hearing aids last week     Arthritis  Presents for follow-up visit. He complains of pain. Affected locations include the neck (arthritis in both hands and neck, lower back soreness when he plays golf). His pain is at a severity of 6/10.  Insomnia  Primary symptoms: sleep disturbance, difficulty falling asleep, frequent awakening.  The onset quality is gradual. Past treatments include medication. Typical bedtime:  8-10 P.M..  How long after going to bed to you fall asleep: 15-30 minutes.     Abdominal Spasm Pt. Has frequent abdominal spasms, he takes Dicyclomine 10 mg twice daily (in morning after breakfast and after dinner), this seems to help relieve his symptoms. Had nausea but no vomiting last weekend but feeling fine now.    Past Medical History:  Diagnosis Date  . Abnormal laboratory test    Sees Dr. Ma Hillock  . Acid reflux   . Arthritis   . Chronic back pain   . Colon cancer ([place]) 11/1999   stage 3 s/p colon resection  . Depression   . History of chemotherapy    5-FU pump/leukovorin  . Insomnia   . Multiple myeloma ([place]) 12/2011   smoldering vs mgus  . Osteoporosis   . Seasonal allergies     Past Surgical History:  Procedure Laterality Date  . COLON SURGERY     resection, 2nd surgery for scar tissue removal  . COLONOSCOPY    . COLONOSCOPY WITH PROPOFOL N/A 03/26/2016   Procedure: COLONOSCOPY WITH PROPOFOL;  Surgeon: Lollie Sails, MD;  Location: [facility] ENDOSCOPY;  Service: Endoscopy;  Laterality: N/A;  . ESOPHAGOGASTRODUODENOSCOPY  10/2010  . LUMBAR FUSION     L4-L5, L5-S1  . SACROILIAC JOINT FUSION    . SHOULDER SURGERY Left   . SPINAL CORD STIMULATOR IMPLANT      Family History  Problem Relation Age of Onset  . Lymphoma Mother   . Lung  cancer Father   . Heart disease Unknown        grandparents    Social History   Social History  . Marital status: Married    Spouse name: N/A  . Number of children: N/A  . Years of education: N/A   Occupational History  . Not on file.   Social History Main Topics  . Smoking status: Former Research scientist (life sciences)  . Smokeless tobacco: Never Used     Comment: as a teenager, stopped at age 21  . Alcohol use No     Comment: Stopped tuesday.   . Drug use: No  . Sexual activity: Yes    Birth control/ protection: None   Other Topics Concern  . Not on file   Social History Narrative  . No narrative on file     Current Outpatient Prescriptions:  .  celecoxib (CELEBREX) 200 MG capsule, Take 1 capsule (200 mg total) by mouth daily., Disp: 90 capsule, Rfl: 1 .  Cholecalciferol (VITAMIN D) 2000 UNITS CAPS, Take 1 capsule by mouth daily., Disp: , Rfl:  .  clonazePAM (KLONOPIN) 0.5 MG tablet, TAKE 1 TABLET BY MOUTH ONCE DAILY AS NEEDED FOR ANXIETY, Disp: 30 tablet, Rfl: 0 .  dicyclomine (BENTYL) 10 MG capsule, TAKE 1 CAPSULE BY MOUTH TWICE DAILY  WITH MEALS, Disp: 60 capsule, Rfl: 1 .  esomeprazole (NEXIUM 24HR) 20 MG capsule, Take by mouth., Disp: , Rfl:  .  Multiple Vitamins-Minerals (CENTRUM SILVER ULTRA MENS) TABS, Take 1 tablet by mouth daily., Disp: , Rfl:  .  mupirocin ointment (BACTROBAN) 2 %, Apply 1 application topically 2 (two) times daily., Disp: , Rfl:  .  traZODone (DESYREL) 100 MG tablet, TAKE 1 TABLET BY MOUTH AT BEDTIME AS NEEDED FOR SLEEP, Disp: 30 tablet, Rfl: 0  Allergies  Allergen Reactions  . Bee Venom Swelling    Bees Bees     Review of Systems  Musculoskeletal: Positive for arthritis.  Psychiatric/Behavioral: Positive for sleep disturbance. The patient has insomnia.      Objective  Vitals:   11/07/16 1001  BP: 122/80  Pulse: 72  Resp: (!) 96  Weight: 125 lb 6.4 oz (56.9 kg)  Height: 5' 7"  (1.702 m)    Physical Exam  Constitutional: He is oriented to  person, place, and time and well-developed, well-nourished, and in no distress.  HENT:  Head: Normocephalic and atraumatic.  Cardiovascular: Normal rate, regular rhythm and normal heart sounds.   No murmur heard. Pulmonary/Chest: Effort normal and breath sounds normal. He has no wheezes.  Abdominal: Soft. Bowel sounds are normal. He exhibits no distension. There is no tenderness.  Neurological: He is alert and oriented to person, place, and time.  Psychiatric: Mood, memory, affect and judgment normal.  Nursing note and vitals reviewed.   Assessment & Plan  1. Flu vaccine need  - Flu vaccine HIGH DOSE PF (Fluzone High dose)  2. Arthritis Stable on Celebrex taken daily - celecoxib (CELEBREX) 200 MG capsule; Take 1 capsule (200 mg total) by mouth daily.  Dispense: 90 capsule; Refill: 1  3. Primary insomnia Symptoms of insomnia responsive to trazodone - traZODone (DESYREL) 100 MG tablet; Take 1 tablet (100 mg total) by mouth at bedtime as needed. for sleep  Dispense: 90 tablet; Refill: 0  4. Abdominal spasms Chronic history of abdominal pain and spasms, responsive to Bentyl - dicyclomine (BENTYL) 10 MG capsule; TAKE 1 CAPSULE BY MOUTH TWICE DAILY WITH MEALS  Dispense: 180 capsule; Refill: 0  Zeriah Baysinger [facility] 11/07/2016 10:09 AM

## 2016-11-19 ENCOUNTER — Ambulatory Visit: Payer: Medicare Other | Admitting: Family Medicine

## 2016-11-30 ENCOUNTER — Other Ambulatory Visit: Payer: Self-pay | Admitting: Family Medicine

## 2016-11-30 ENCOUNTER — Telehealth: Payer: Self-pay

## 2016-11-30 DIAGNOSIS — F411 Generalized anxiety disorder: Secondary | ICD-10-CM

## 2016-12-03 NOTE — Telephone Encounter (Signed)
erroneous

## 2016-12-07 ENCOUNTER — Telehealth: Payer: Self-pay

## 2016-12-07 NOTE — Telephone Encounter (Signed)
Called pt re: need to reschedule appt on 01/14/17 for AWV with [facility]. LVM requesting returned call.

## 2016-12-11 ENCOUNTER — Telehealth: Payer: Self-pay

## 2016-12-11 NOTE — Telephone Encounter (Signed)
Called pt re: need to reschedule appt on 01/14/17 for AWV with [facility]. LVM requesting returned call.

## 2016-12-14 NOTE — Progress Notes (Signed)
2 attempts have been made to reach pt to reschedule appt. Pt has not returned any calls to reschedule. Letter mailed

## 2016-12-31 ENCOUNTER — Other Ambulatory Visit: Payer: Self-pay | Admitting: Family Medicine

## 2016-12-31 DIAGNOSIS — F411 Generalized anxiety disorder: Secondary | ICD-10-CM

## 2017-01-07 ENCOUNTER — Inpatient Hospital Stay: Payer: Medicare Other | Attending: Internal Medicine

## 2017-01-07 DIAGNOSIS — K219 Gastro-esophageal reflux disease without esophagitis: Secondary | ICD-10-CM | POA: Diagnosis not present

## 2017-01-07 DIAGNOSIS — Z79899 Other long term (current) drug therapy: Secondary | ICD-10-CM | POA: Diagnosis not present

## 2017-01-07 DIAGNOSIS — D472 Monoclonal gammopathy: Secondary | ICD-10-CM

## 2017-01-07 DIAGNOSIS — Z87891 Personal history of nicotine dependence: Secondary | ICD-10-CM | POA: Diagnosis not present

## 2017-01-07 DIAGNOSIS — R197 Diarrhea, unspecified: Secondary | ICD-10-CM | POA: Diagnosis not present

## 2017-01-07 DIAGNOSIS — C9000 Multiple myeloma not having achieved remission: Secondary | ICD-10-CM | POA: Diagnosis not present

## 2017-01-07 DIAGNOSIS — Z8619 Personal history of other infectious and parasitic diseases: Secondary | ICD-10-CM | POA: Diagnosis not present

## 2017-01-07 LAB — CBC WITH DIFFERENTIAL/PLATELET
BASOS PCT: 1 %
Basophils Absolute: 0 10*3/uL (ref 0–0.1)
EOS ABS: 0.3 10*3/uL (ref 0–0.7)
EOS PCT: 4 %
HEMATOCRIT: 41.4 % (ref 40.0–52.0)
HEMOGLOBIN: 14.3 g/dL (ref 13.0–18.0)
Lymphocytes Relative: 28 %
Lymphs Abs: 1.9 10*3/uL (ref 1.0–3.6)
MCH: 32.5 pg (ref 26.0–34.0)
MCHC: 34.6 g/dL (ref 32.0–36.0)
MCV: 94 fL (ref 80.0–100.0)
MONO ABS: 0.5 10*3/uL (ref 0.2–1.0)
Monocytes Relative: 8 %
NEUTROS ABS: 4 10*3/uL (ref 1.4–6.5)
Neutrophils Relative %: 59 %
Platelets: 402 10*3/uL (ref 150–440)
RBC: 4.4 MIL/uL (ref 4.40–5.90)
RDW: 12.4 % (ref 11.5–14.5)
WBC: 6.7 10*3/uL (ref 3.8–10.6)

## 2017-01-07 LAB — COMPREHENSIVE METABOLIC PANEL
ALBUMIN: 4.1 g/dL (ref 3.5–5.0)
ALK PHOS: 71 U/L (ref 38–126)
ALT: 13 U/L — ABNORMAL LOW (ref 17–63)
ANION GAP: 9 (ref 5–15)
AST: 21 U/L (ref 15–41)
BILIRUBIN TOTAL: 0.6 mg/dL (ref 0.3–1.2)
BUN: 17 mg/dL (ref 6–20)
CALCIUM: 9.4 mg/dL (ref 8.9–10.3)
CO2: 28 mmol/L (ref 22–32)
CREATININE: 1.17 mg/dL (ref 0.61–1.24)
Chloride: 103 mmol/L (ref 101–111)
GFR calc non Af Amer: >60 mL/min (ref >60)
GLUCOSE: 98 mg/dL (ref 65–99)
Potassium: 4.3 mmol/L (ref 3.5–5.1)
Sodium: 140 mmol/L (ref 135–145)
TOTAL PROTEIN: 7 g/dL (ref 6.5–8.1)

## 2017-01-08 LAB — KAPPA/LAMBDA LIGHT CHAINS
Kappa free light chain: 128.9 mg/L — ABNORMAL HIGH (ref 3.3–19.4)
Kappa, lambda light chain ratio: 8.89 — ABNORMAL HIGH (ref 0.26–1.65)
LAMDA FREE LIGHT CHAINS: 14.5 mg/L (ref 5.7–26.3)

## 2017-01-09 ENCOUNTER — Encounter: Payer: Self-pay | Admitting: Family Medicine

## 2017-01-09 ENCOUNTER — Ambulatory Visit (INDEPENDENT_AMBULATORY_CARE_PROVIDER_SITE_OTHER): Payer: Medicare Other | Admitting: Family Medicine

## 2017-01-09 ENCOUNTER — Ambulatory Visit (INDEPENDENT_AMBULATORY_CARE_PROVIDER_SITE_OTHER): Payer: Medicare Other

## 2017-01-09 VITALS — BP 108/68 | HR 83 | Temp 98.2°F | Resp 18 | Ht 67.0 in | Wt 127.9 lb

## 2017-01-09 VITALS — BP 118/78 | HR 60 | Temp 98.7°F | Resp 16 | Ht 67.0 in | Wt 127.9 lb

## 2017-01-09 DIAGNOSIS — Z0000 Encounter for general adult medical examination without abnormal findings: Secondary | ICD-10-CM

## 2017-01-09 DIAGNOSIS — Z125 Encounter for screening for malignant neoplasm of prostate: Secondary | ICD-10-CM

## 2017-01-09 DIAGNOSIS — Z23 Encounter for immunization: Secondary | ICD-10-CM

## 2017-01-09 DIAGNOSIS — R739 Hyperglycemia, unspecified: Secondary | ICD-10-CM

## 2017-01-09 DIAGNOSIS — M1990 Unspecified osteoarthritis, unspecified site: Secondary | ICD-10-CM | POA: Diagnosis not present

## 2017-01-09 DIAGNOSIS — C9000 Multiple myeloma not having achieved remission: Secondary | ICD-10-CM | POA: Diagnosis not present

## 2017-01-09 DIAGNOSIS — D472 Monoclonal gammopathy: Secondary | ICD-10-CM

## 2017-01-09 LAB — MULTIPLE MYELOMA PANEL, SERUM
Albumin SerPl Elph-Mcnc: 3.7 g/dL (ref 2.9–4.4)
Albumin/Glob SerPl: 1.4 (ref 0.7–1.7)
Alpha 1: 0.2 g/dL (ref 0.0–0.4)
Alpha2 Glob SerPl Elph-Mcnc: 0.7 g/dL (ref 0.4–1.0)
B-Globulin SerPl Elph-Mcnc: 0.9 g/dL (ref 0.7–1.3)
Gamma Glob SerPl Elph-Mcnc: 1 g/dL (ref 0.4–1.8)
Globulin, Total: 2.8 g/dL (ref 2.2–3.9)
IgA: 387 mg/dL (ref 61–437)
IgG (Immunoglobin G), Serum: 697 mg/dL — ABNORMAL LOW (ref 700–1600)
IgM (Immunoglobulin M), Srm: 52 mg/dL (ref 20–172)
M Protein SerPl Elph-Mcnc: 0.6 g/dL — ABNORMAL HIGH
Total Protein ELP: 6.5 g/dL (ref 6.0–8.5)
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
## 2017-01-09 NOTE — Patient Instructions (Addendum)
[name] , Thank you for taking time to come for your Medicare Wellness Visit. I appreciate your ongoing commitment to your health goals. Please review the following plan we discussed and let me know if I can assist you in the future.   Screening recommendations/referrals: Colonoscopy: Completed 03/26/16. Repeat colonoscopy every 10 years Recommended yearly ophthalmology/optometry visit for glaucoma screening and checkup Recommended yearly dental visit for hygiene and checkup  Vaccinations: Influenza vaccine: Completed 11/07/16 Pneumococcal vaccine: Completed PCV13 01/09/16. PPSV23 given today Tdap vaccine: Completed 07/06/12 Shingles vaccine: Declined. Please provide a copy of your vaccination record.  Advanced directives: Please bring a copy of your POA (Power of Attorney) and/or Living Will to your next appointment.   Conditions/risks identified: Recommend to drink at least 6-8 8oz glasses of water per day.  Next appointment: You are scheduled to see Dr. Manuella Ghazi on 01/09/17 @ 2:40pm.   Please schedule your annual wellness exam with your Nurse Health Advisor in one year.   Preventive Care 68 Years and Older, Male Preventive care refers to lifestyle choices and visits with your health care provider that can promote health and wellness. What does preventive care include?  A yearly physical exam. This is also called an annual well check.  Dental exams once or twice a year.  Routine eye exams. Ask your health care provider how often you should have your eyes checked.  Personal lifestyle choices, including:  Daily care of your teeth and gums.  Regular physical activity.  Eating a healthy diet.  Avoiding tobacco and drug use.  Limiting alcohol use.  Practicing safe sex.  Taking low doses of aspirin every day.  Taking vitamin and mineral supplements as recommended by your health care provider. What happens during an annual well check? The services and screenings done by your  health care provider during your annual well check will depend on your age, overall health, lifestyle risk factors, and family history of disease. Counseling  Your health care provider may ask you questions about your:  Alcohol use.  Tobacco use.  Drug use.  Emotional well-being.  Home and relationship well-being.  Sexual activity.  Eating habits.  History of falls.  Memory and ability to understand (cognition).  Work and work Statistician. Screening  You may have the following tests or measurements:  Height, weight, and BMI.  Blood pressure.  Lipid and cholesterol levels. These may be checked every 5 years, or more frequently if you are over 67 years old.  Skin check.  Lung cancer screening. You may have this screening every year starting at age 76 if you have a 30-pack-year history of smoking and currently smoke or have quit within the past 15 years.  Fecal occult blood test (FOBT) of the stool. You may have this test every year starting at age 75.  Flexible sigmoidoscopy or colonoscopy. You may have a sigmoidoscopy every 5 years or a colonoscopy every 10 years starting at age 70.  Prostate cancer screening. Recommendations will vary depending on your family history and other risks.  Hepatitis C blood test.  Hepatitis B blood test.  Sexually transmitted disease (STD) testing.  Diabetes screening. This is done by checking your blood sugar (glucose) after you have not eaten for a while (fasting). You may have this done every 1-3 years.  Abdominal aortic aneurysm (AAA) screening. You may need this if you are a current or former smoker.  Osteoporosis. You may be screened starting at age 68 if you are at high risk. Talk with  your health care provider about your test results, treatment options, and if necessary, the need for more tests. Vaccines  Your health care provider may recommend certain vaccines, such as:  Influenza vaccine. This is recommended every  year.  Tetanus, diphtheria, and acellular pertussis (Tdap, Td) vaccine. You may need a Td booster every 10 years.  Zoster vaccine. You may need this after age 4.  Pneumococcal 13-valent conjugate (PCV13) vaccine. One dose is recommended after age 94.  Pneumococcal polysaccharide (PPSV23) vaccine. One dose is recommended after age 1. Talk to your health care provider about which screenings and vaccines you need and how often you need them. This information is not intended to replace advice given to you by your health care provider. Make sure you discuss any questions you have with your health care provider. Document Released: 02/18/2015 Document Revised: 10/12/2015 Document Reviewed: 11/23/2014 Elsevier Interactive Patient Education  2017 [place] Prevention in the Home Falls can cause injuries. They can happen to people of all ages. There are many things you can do to make your home safe and to help prevent falls. What can I do on the outside of my home?  Regularly fix the edges of walkways and driveways and fix any cracks.  Remove anything that might make you trip as you walk through a door, such as a raised step or threshold.  Trim any bushes or trees on the path to your home.  Use bright outdoor lighting.  Clear any walking paths of anything that might make someone trip, such as rocks or tools.  Regularly check to see if handrails are loose or broken. Make sure that both sides of any steps have handrails.  Any raised decks and porches should have guardrails on the edges.  Have any leaves, snow, or ice cleared regularly.  Use sand or salt on walking paths during winter.  Clean up any spills in your garage right away. This includes oil or grease spills. What can I do in the bathroom?  Use night lights.  Install grab bars by the toilet and in the tub and shower. Do not use towel bars as grab bars.  Use non-skid mats or decals in the tub or shower.  If you  need to sit down in the shower, use a plastic, non-slip stool.  Keep the floor dry. Clean up any water that spills on the floor as soon as it happens.  Remove soap buildup in the tub or shower regularly.  Attach bath mats securely with double-sided non-slip rug tape.  Do not have throw rugs and other things on the floor that can make you trip. What can I do in the bedroom?  Use night lights.  Make sure that you have a light by your bed that is easy to reach.  Do not use any sheets or blankets that are too big for your bed. They should not hang down onto the floor.  Have a firm chair that has side arms. You can use this for support while you get dressed.  Do not have throw rugs and other things on the floor that can make you trip. What can I do in the kitchen?  Clean up any spills right away.  Avoid walking on wet floors.  Keep items that you use a lot in easy-to-reach places.  If you need to reach something above you, use a strong step stool that has a grab bar.  Keep electrical cords out of the way.  Do not  use floor polish or wax that makes floors slippery. If you must use wax, use non-skid floor wax.  Do not have throw rugs and other things on the floor that can make you trip. What can I do with my stairs?  Do not leave any items on the stairs.  Make sure that there are handrails on both sides of the stairs and use them. Fix handrails that are broken or loose. Make sure that handrails are as long as the stairways.  Check any carpeting to make sure that it is firmly attached to the stairs. Fix any carpet that is loose or worn.  Avoid having throw rugs at the top or bottom of the stairs. If you do have throw rugs, attach them to the floor with carpet tape.  Make sure that you have a light switch at the top of the stairs and the bottom of the stairs. If you do not have them, ask someone to add them for you. What else can I do to help prevent falls?  Wear shoes  that:  Do not have high heels.  Have rubber bottoms.  Are comfortable and fit you well.  Are closed at the toe. Do not wear sandals.  If you use a stepladder:  Make sure that it is fully opened. Do not climb a closed stepladder.  Make sure that both sides of the stepladder are locked into place.  Ask someone to hold it for you, if possible.  Clearly Ki[place] and make sure that you can see:  Any grab bars or handrails.  First and last steps.  Where the edge of each step is.  Use tools that help you move around (mobility aids) if they are needed. These include:  Canes.  Walkers.  Scooters.  Crutches.  Turn on the lights when you go into a dark area. Replace any light bulbs as soon as they burn out.  Set up your furniture so you have a clear path. Avoid moving your furniture around.  If any of your floors are uneven, fix them.  If there are any pets around you, be aware of where they are.  Review your medicines with your doctor. Some medicines can make you feel dizzy. This can increase your chance of falling. Ask your doctor what other things that you can do to help prevent falls. This information is not intended to replace advice given to you by your health care provider. Make sure you discuss any questions you have with your health care provider. Document Released: 11/18/2008 Document Revised: 06/30/2015 Document Reviewed: 02/26/2014 Elsevier Interactive Patient Education  2017 [employer].

## 2017-01-10 ENCOUNTER — Telehealth: Payer: Self-pay

## 2017-01-10 ENCOUNTER — Encounter: Payer: Self-pay | Admitting: Family Medicine

## 2017-01-10 ENCOUNTER — Other Ambulatory Visit: Payer: Self-pay

## 2017-01-10 NOTE — Progress Notes (Signed)
Pt spoke to Dr. Manuella Ghazi about labs. Patient came to our office today asking about signing a ABN for labs. ABN was not provided. Pt was not fasting therefore rhonda suggest the pt to come back tomorrow for fasting labs and to sign the ABN. Pt request to have the Vitamin D removed he will not pay for that and CMP&CBC was already drawn by another provider on 01/07/2017 therefore insurance will not cover another. Cancelled all three labs and put up front the labs that are needed; Lipid, PSA and TSH.

## 2017-01-10 NOTE — Telephone Encounter (Signed)
Tammy from Dr. Florina Ou called stating the Pt had a root canal on 01/10/2017 and pt have a lot abscess. Patient was given penicillin and it's not working. They are not aware of his medical history and wanted to know what would help. Please advise. Call back number is [phone]

## 2017-01-10 NOTE — Telephone Encounter (Signed)
Called Dr. Manuella Ghazi on his personal phone and went through the pt chart with him and he stated since he is not allergic to any antibiotics and not responding to penicillin he can try clindamycin take 300 mg tablet every 6 hours PRN which respond well with abscess. Notified Tammy from Dr.H[place] office.

## 2017-01-11 DIAGNOSIS — Z125 Encounter for screening for malignant neoplasm of prostate: Secondary | ICD-10-CM | POA: Diagnosis not present

## 2017-01-11 DIAGNOSIS — R739 Hyperglycemia, unspecified: Secondary | ICD-10-CM | POA: Diagnosis not present

## 2017-01-11 DIAGNOSIS — M1990 Unspecified osteoarthritis, unspecified site: Secondary | ICD-10-CM | POA: Diagnosis not present

## 2017-01-11 DIAGNOSIS — Z0000 Encounter for general adult medical examination without abnormal findings: Secondary | ICD-10-CM | POA: Diagnosis not present

## 2017-01-11 DIAGNOSIS — C9000 Multiple myeloma not having achieved remission: Secondary | ICD-10-CM | POA: Diagnosis not present

## 2017-01-12 LAB — LIPID PANEL
Cholesterol: 174 mg/dL (ref <200)
HDL: 51 mg/dL (ref >40)
LDL Cholesterol (Calc): 96 mg/dL (calc)
NON-HDL CHOLESTEROL (CALC): 123 mg/dL (ref <130)
Total CHOL/HDL Ratio: 3.4 (calc) (ref <5.0)
Triglycerides: 169 mg/dL — ABNORMAL HIGH (ref <150)

## 2017-01-12 LAB — PSA: PSA: 1.7 ng/mL (ref <=4.0)

## 2017-01-12 LAB — TSH: TSH: 4.65 m[IU]/L — AB (ref 0.40–4.50)

## 2017-01-14 ENCOUNTER — Ambulatory Visit: Payer: Medicare Other

## 2017-01-14 ENCOUNTER — Inpatient Hospital Stay: Payer: Medicare Other | Admitting: Internal Medicine

## 2017-01-17 ENCOUNTER — Other Ambulatory Visit: Payer: Self-pay

## 2017-01-17 DIAGNOSIS — R739 Hyperglycemia, unspecified: Secondary | ICD-10-CM

## 2017-01-17 MED ORDER — ROSUVASTATIN CALCIUM 5 MG PO TABS: 5.0000 mg | ORAL_TABLET | Freq: Every day | ORAL | 0 refills | Status: DC

## 2017-01-17 NOTE — Telephone Encounter (Signed)
Rx sent to his local pharmacy

## 2017-01-17 NOTE — Telephone Encounter (Signed)
Pt states he would like the Crestor called in to his pharmacy.

## 2017-01-21 ENCOUNTER — Ambulatory Visit: Payer: Medicare Other | Admitting: Family Medicine

## 2017-01-25 ENCOUNTER — Ambulatory Visit (INDEPENDENT_AMBULATORY_CARE_PROVIDER_SITE_OTHER): Payer: Medicare Other | Admitting: Family Medicine

## 2017-01-25 ENCOUNTER — Ambulatory Visit: Payer: Self-pay | Admitting: *Deleted

## 2017-01-25 ENCOUNTER — Encounter: Payer: Self-pay | Admitting: Family Medicine

## 2017-01-25 VITALS — BP 110/64 | HR 80 | Temp 98.2°F | Resp 16 | Wt 128.9 lb

## 2017-01-25 DIAGNOSIS — A09 Infectious gastroenteritis and colitis, unspecified: Secondary | ICD-10-CM | POA: Diagnosis not present

## 2017-01-25 DIAGNOSIS — Z8619 Personal history of other infectious and parasitic diseases: Secondary | ICD-10-CM

## 2017-01-25 DIAGNOSIS — R197 Diarrhea, unspecified: Secondary | ICD-10-CM | POA: Diagnosis not present

## 2017-01-25 MED ORDER — VANCOMYCIN HCL 125 MG PO CAPS: 125.0000 mg | ORAL_CAPSULE | Freq: Four times a day (QID) | ORAL | 0 refills | Status: AC

## 2017-01-25 NOTE — Telephone Encounter (Signed)
Apt schedule with Raelyn Ensign, NP at 11:20am.

## 2017-01-25 NOTE — Patient Instructions (Addendum)
Food Choices to Help Relieve Diarrhea, Adult When you have diarrhea, the foods you eat and your eating habits are very important. Choosing the right foods and drinks can help:  Relieve diarrhea.  Replace lost fluids and nutrients.  Prevent dehydration.  What general guidelines should I follow? Relieving diarrhea  Choose foods with less than 2 g or .07 oz. of fiber per serving.  Limit fats to less than 8 tsp (38 g or 1.34 oz.) a day.  Avoid the following: ? Foods and beverages sweetened with high-fructose corn syrup, honey, or sugar alcohols such as xylitol, sorbitol, and mannitol. ? Foods that contain a lot of fat or sugar. ? Fried, greasy, or spicy foods. ? High-fiber grains, breads, and cereals. ? Raw fruits and vegetables.  Eat foods that are rich in probiotics. These foods include dairy products such as yogurt and fermented milk products. They help increase healthy bacteria in the stomach and intestines (gastrointestinal tract, or GI tract).  If you have lactose intolerance, avoid dairy products. These may make your diarrhea worse.  Take medicine to help stop diarrhea (antidiarrheal medicine) only as told by your health care provider. Replacing nutrients  Eat small meals or snacks every 3-4 hours.  Eat bland foods, such as white rice, toast, or baked potato, until your diarrhea starts to get better. Gradually reintroduce nutrient-rich foods as tolerated or as told by your health care provider. This includes: ? Well-cooked protein foods. ? Peeled, seeded, and soft-cooked fruits and vegetables. ? Low-fat dairy products.  Take vitamin and mineral supplements as told by your health care provider. Preventing dehydration   Start by sipping water or a special solution to prevent dehydration (oral rehydration solution, ORS). Urine that is clear or pale yellow means that you are getting enough fluid.  Try to drink at least 8-10 cups of fluid each day to help replace lost  fluids.  You may add other liquids in addition to water, such as clear juice or decaffeinated sports drinks, as tolerated or as told by your health care provider.  Avoid drinks with caffeine, such as coffee, tea, or soft drinks.  Avoid alcohol. What foods are recommended? The items listed may not be a complete list. Talk with your health care provider about what dietary choices are best for you. Grains White rice. White, French, or pita breads (fresh or toasted), including plain rolls, buns, or bagels. White pasta. Saltine, soda, or graham crackers. Pretzels. Low-fiber cereal. Cooked cereals made with water (such as cornmeal, farina, or cream cereals). Plain muffins. Matzo. Melba toast. Zwieback. Vegetables Potatoes (without the skin). Most well-cooked and canned vegetables without skins or seeds. Tender lettuce. Fruits Apple sauce. Fruits canned in juice. Cooked apricots, cherries, grapefruit, peaches, pears, or plums. Fresh bananas and cantaloupe. Meats and other protein foods Baked or boiled chicken. Eggs. Tofu. Fish. Seafood. Smooth nut butters. Ground or well-cooked tender beef, ham, veal, lamb, pork, or poultry. Dairy Plain yogurt, kefir, and unsweetened liquid yogurt. Lactose-free milk, buttermilk, skim milk, or soy milk. Low-fat or nonfat hard cheese. Beverages Water. Low-calorie sports drinks. Fruit juices without pulp. Strained tomato and vegetable juices. Decaffeinated teas. Sugar-free beverages not sweetened with sugar alcohols. Oral rehydration solutions, if approved by your health care provider. Seasoning and other foods Bouillon, broth, or soups made from recommended foods. What foods are not recommended? The items listed may not be a complete list. Talk with your health care provider about what dietary choices are best for you. Grains Whole grain, whole wheat,   bran, or rye breads, rolls, pastas, and crackers. Wild or brown rice. Whole grain or bran cereals. Barley. Oats and  oatmeal. Corn tortillas or taco shells. Granola. Popcorn. Vegetables Raw vegetables. Fried vegetables. Cabbage, broccoli, Brussels sprouts, artichokes, baked beans, beet greens, corn, kale, legumes, peas, sweet potatoes, and yams. Potato skins. Cooked spinach and cabbage. Fruits Dried fruit, including raisins and dates. Raw fruits. Stewed or dried prunes. Canned fruits with syrup. Meat and other protein foods Fried or fatty meats. Deli meats. Chunky nut butters. Nuts and seeds. Beans and lentils. Berniece Salines. Hot dogs. Sausage. Dairy High-fat cheeses. Whole milk, chocolate milk, and beverages made with milk, such as milk shakes. Half-and-half. Cream. sour cream. Ice cream. Beverages Caffeinated beverages (such as coffee, tea, soda, or energy drinks). Alcoholic beverages. Fruit juices with pulp. Prune juice. Soft drinks sweetened with high-fructose corn syrup or sugar alcohols. High-calorie sports drinks. Fats and oils Butter. Cream sauces. Margarine. Salad oils. Plain salad dressings. Olives. Avocados. Mayonnaise. Sweets and desserts Sweet rolls, doughnuts, and sweet breads. Sugar-free desserts sweetened with sugar alcohols such as xylitol and sorbitol. Seasoning and other foods Honey. Hot sauce. Chili powder. Gravy. Cream-based or milk-based soups. Pancakes and waffles. Summary  When you have diarrhea, the foods you eat and your eating habits are very important.  Make sure you get at least 8-10 cups of fluid each day, or enough to keep your urine clear or pale yellow.  Eat bland foods and gradually reintroduce healthy, nutrient-rich foods as tolerated, or as told by your health care provider.  Avoid high-fiber, fried, greasy, or spicy foods. This information is not intended to replace advice given to you by your health care provider. Make sure you discuss any questions you have with your health care provider. Document Released: 04/14/2003 Document Revised: 01/20/2016 Document Reviewed:  01/20/2016 Elsevier Interactive Patient Education  2018 [employer]. Probiotics What are probiotics? Probiotics are the good bacteria and yeasts that live in your body and keep you and your digestive system healthy. Probiotics also help your body's defense (immune) system and protect your body against bad bacterial growth. Certain foods contain probiotics, such as yogurt. Probiotics can also be purchased as a supplement. As with any supplement or drug, it is important to discuss its use with your health care provider. What affects the balance of bacteria in my body? The balance of bacteria in your body can be affected by:  Antibiotic medicines. Antibiotics are sometimes necessary to treat infection. Unfortunately, they may kill good or friendly bacteria in your body as well as the bad bacteria. This may lead to stomach problems like diarrhea, gas, and cramping.  Disease. Some conditions are the result of an overgrowth of bad bacteria, yeasts, parasites, or fungi. These conditions include: ? Infectious diarrhea. ? Stomach and respiratory infections. ? Skin infections. ? Irritable bowel syndrome (IBS). ? Inflammatory bowel diseases. ? Ulcer due to Helicobacter pylori (H. pylori) infection. ? Tooth decay and periodontal disease. ? Vaginal infections.  Stress and poor diet may also lower the good bacteria in your body. What type of probiotic is right for me? Probiotics are available over the counter at your local pharmacy, health food, or grocery store. They come in many different forms, combinations of strains, and dosing strengths. Some may need to be refrigerated. Always read the label for storage and usage instructions. Specific strains have been shown to be more effective for certain conditions. Ask your health care provider what option is best for you. Why would I need probiotics?  There are many reasons your health care provider might recommend a probiotic supplement,  including:  Diarrhea.  Constipation.  IBS.  Respiratory infections.  Yeast infections.  Acne, eczema, and other skin conditions.  Frequent urinary tract infections (UTIs).  Are there side effects of probiotics? Some people experience mild side effects when taking probiotics. Side effects are usually temporary and may include:  Gas.  Bloating.  Cramping.  Rarely, serious side effects, such as infection or immune system changes, may occur. What else do I need to know about probiotics?  There are many different strains of probiotics. Certain strains may be more effective depending on your condition. Probiotics are available in varying doses. Ask your health care provider which probiotic you should use and how often.  If you are taking probiotics along with antibiotics, it is generally recommended to wait at least 2 hours between taking the antibiotic and taking the probiotic. For more information: [facility] for Complementary and Alternative Medicine [employer] This information is not intended to replace advice given to you by your health care provider. Make sure you discuss any questions you have with your health care provider. Document Released: 08/19/2013 Document Revised: 12/20/2015 Document Reviewed: 04/21/2013 Elsevier Interactive Patient Education  2017 [employer]  Clostridium Difficile Infection Clostridium difficile (C. difficile or C. diff) infection causes inflammation of the large intestine (colon). This condition can result in damage to the lining of your colon and may lead to another condition called colitis. This infection can be passed from person to person (is contagious). Follow these instructions at home: Eating and drinking  Drink enough fluid to keep your pee (urine) clear or pale yellow.  Avoid drinking: ? Milk. ? Caffeine. ? Alcohol.  Follow exact instructions from your doctor about how to get enough fluid in your body  (rehydrate).  Eat small meals often instead of large meals. Medicines  Take your antibiotic medicine as told by your doctor. Do not stop taking the antibiotic even if you start to feel better unless your doctor told you to do that.  Take over-the-counter and prescription medicines only as told by your doctor.  Do not use medicines to help with watery poop (diarrhea). General instructions  Wash your hands fully before you prepare food and after you use the bathroom. Make sure people who live with you also wash their  hands often.  Clean the surfaces that you touch. Use a product that contains chlorine bleach.  Keep all follow-up visits as told by your doctor. This is important. Contact a doctor if:  Your symptoms do not get better with treatment.  Your symptoms get worse with treatment.  Your symptoms go away and then come back.  You have a fever.  You have new symptoms. Get help right away if:  You have more pain or tenderness in your belly (abdomen).  Your poop (stool) is mostly bloody.  Your poop looks dark black and tarry.  You cannot eat or drink without throwing up (vomiting).  You have signs of dehydration, such as: ? Dark pee, very little pee, or no pee. ? Cracked lips. ? Not making tears when you cry. ? Dry mouth. ? Sunken eyes. ? Feeling sleepy. ? Feeling weak. ? Feeling dizzy. This information is not intended to replace advice given to you by your health care provider. Make sure you discuss any questions you have with your health care provider. Document Released: 11/19/2008 Document Revised: 06/30/2015 Document Reviewed: 07/26/2014 Elsevier Interactive Patient Education  2017 [employer]  

## 2017-01-25 NOTE — Progress Notes (Signed)
Name: [name]   MRN: [id number]    DOB: Aug 22, 1950   Date:01/25/2017       Progress Note  Subjective  Chief Complaint  Chief Complaint  Patient presents with  . Medication Problem    Patient has started having headache, stomach pain- with diarrhea couple of days after taken crestor last night. Started with headachesPatient has hx of bowel surgery and had hx of C-diff.    HPI  Presents with concern for abdominal pain and watery stools x3-4 days.  Recently started 29m Crestor x3-4 days - developed headaches, pain in stomach, diarrhea, and cramping - questions if Crestor could be contributing; stopped crestor yesterday and nothing has changed.  He notes that he will eat something and 30 minutes later will have abdominal cramping and watery diarrhea - having several episodes daily.  Taking Bentyl BID which helps with his chronic spasms, notes he has chronic generalized abdominal tenderness that has not worsened with this illness.  Denies: Nausea, vomiting, fevers/chills, body aches, chest pain, shortness of breath, rashes, blood in stool or dark and tarry stools.  Pertinent History: - Had C. Diff in 2005. - He was taking Clindamycin for infected root canal - finished about 1.5 weeks ago. - Has history colorectal cancer with resection October 2001 with Chemo; Had colonoscopy 03/26/2016 which found a hyperplastic polyp.   - Has chronic abdominal spasms s/p resection. - Keeps follow up with Dr. BBurlene Arntfor smoldering multiple myeloma.    Patient Active Problem List   Diagnosis Date Noted  . Abdominal spasms 01/16/2016  . [place] 01/09/2016  . Arthritis 05/18/2015  . Hyperglycemia 02/16/2015  . Anxiety disorder 10/04/2014  . Allergic rhinitis 10/04/2014  . Acid reflux 10/04/2014  . Insomnia 09/06/2014    Social History   Tobacco Use  . Smoking status: Former Smoker    Packs/day: 1.00    Years: 10.00    Pack years: 10.00    Types: Cigarettes    Last attempt to quit: 03/06/1970    Years since quitting: 46.9  . Smokeless tobacco: Never Used  . Tobacco comment: started at age [age]and quit at age 66 Substance Use Topics  . Alcohol use: No    Frequency: Never    Comment: Stopped tuesday.      Current Outpatient Medications:  .  celecoxib (CELEBREX) 200 MG capsule, Take 1 capsule (200 mg total) by mouth daily., Disp: 90 capsule, Rfl: 1 .  Cholecalciferol (VITAMIN D) 2000 UNITS CAPS, Take 1 capsule by mouth daily., Disp: , Rfl:  .  clonazePAM (KLONOPIN) 0.5 MG tablet, TAKE 1 TABLET BY MOUTH ONCE DAILY AS NEEDED FOR ANXIETY, Disp: 30 tablet, Rfl: 0 .  esomeprazole (NEXIUM 24HR) 20 MG capsule, Take by mouth., Disp: , Rfl:  .  Multiple Vitamins-Minerals (CENTRUM SILVER ULTRA MENS) TABS, Take 1 tablet by mouth daily., Disp: , Rfl:  .  traZODone (DESYREL) 100 MG tablet, Take 1 tablet (100 mg total) by mouth at bedtime as needed. for sleep, Disp: 90 tablet, Rfl: 0 .  clindamycin (CLEOCIN) 300 MG capsule, Take 1 capsule by mouth daily., Disp: , Rfl: 0 .  dicyclomine (BENTYL) 10 MG capsule, TAKE 1 CAPSULE BY MOUTH TWICE DAILY WITH MEALS (Patient not taking: Reported on 01/25/2017), Disp: 180 capsule, Rfl: 0 .  penicillin v potassium (VEETID) 500 MG tablet, , Disp: , Rfl: 0 .  rosuvastatin (CRESTOR) 5 MG tablet, Take 1 tablet (5 mg total) by mouth at bedtime. (Patient  not taking: Reported on 01/25/2017), Disp: 90 tablet, Rfl: 0 .  vitamin E 400 UNIT capsule, Take by mouth., Disp: , Rfl:   Allergies  Allergen Reactions  . Bee Venom Swelling    Bees Bees    ROS  Constitutional: Negative for fever or weight change.  Respiratory: Negative for cough and shortness of breath.   Cardiovascular: Negative for chest pain or palpitations.  Gastrointestinal: See HPI Musculoskeletal: Negative for gait problem or joint swelling.  Skin: Negative for rash.  Neurological: Negative for dizziness or headache.  No other specific complaints in a complete  review of systems (except as listed in HPI above).  Objective  Vitals:   01/25/17 1129  BP: 110/64  Pulse: 80  Resp: 16  Temp: 98.2 F (36.8 C)  TempSrc: Oral  SpO2: 99%  Weight: 128 lb 14.4 oz (58.5 kg)   Body mass index is 20.19 kg/m.  Nursing Note and Vital Signs reviewed.  Physical Exam Constitutional: Patient appears well-developed and well-nourished. Obese No distress.  HEENT: head atraumatic, normocephalic Cardiovascular: Normal rate, regular rhythm, S1/S2 present.  No murmur or rub heard. No BLE edema. Pulmonary/Chest: Effort normal and breath sounds clear. No respiratory distress or retractions. Abdominal: Soft and generalized tenderness is present, bowel sounds present x4 quadrants. Vertical surgical scar present to central lower abdomen. Psychiatric: Patient has a normal mood and affect. behavior is normal. Judgment and thought content normal.  Recent Results (from the past 2160 hour(s))  Kappa/lambda light chains     Status: Abnormal   Collection Time: 01/07/17 10:10 AM  Result Value Ref Range   Kappa free light chain 128.9 (H) 3.3 - 19.4 mg/L   Lamda free light chains 14.5 5.7 - 26.3 mg/L   Kappa, lamda light chain ratio 8.89 (H) 0.26 - 1.65    Comment: (NOTE) Performed At: [facility] [place], [place] [id number] Rush Farmer MD PY:[id number]   Multiple Myeloma Panel (SPEP&IFE w/QIG)     Status: Abnormal   Collection Time: 01/07/17 10:10 AM  Result Value Ref Range   IgG (Immunoglobin G), Serum 697 (L) 700 - 1,600 mg/dL   IgA 387 61 - 437 mg/dL   IgM (Immunoglobulin M), Srm 52 20 - 172 mg/dL   Total Protein ELP 6.5 6.0 - 8.5 g/dL   Albumin SerPl Elph-Mcnc 3.7 2.9 - 4.4 g/dL   Alpha 1 0.2 0.0 - 0.4 g/dL   Alpha2 Glob SerPl Elph-Mcnc 0.7 0.4 - 1.0 g/dL   B-Globulin SerPl Elph-Mcnc 0.9 0.7 - 1.3 g/dL   Gamma Glob SerPl Elph-Mcnc 1.0 0.4 - 1.8 g/dL   M Protein SerPl Elph-Mcnc 0.6 (H) Not Observed g/dL   Globulin, Total 2.8 2.2 -  3.9 g/dL   Albumin/Glob SerPl 1.4 0.7 - 1.7   IFE 1 Comment     Comment: (NOTE) Immunofixation shows IgA monoclonal protein with kappa light chain specificity.    Please Note Comment     Comment: (NOTE) Protein electrophoresis scan will follow via computer, mail, or courier delivery. Performed At: [facility] [place], [place] [id number] Rush Farmer MD PF:[id number]   Comprehensive metabolic panel     Status: Abnormal   Collection Time: 01/07/17 10:10 AM  Result Value Ref Range   Sodium 140 135 - 145 mmol/L   Potassium 4.3 3.5 - 5.1 mmol/L   Chloride 103 101 - 111 mmol/L   CO2 28 22 - 32 mmol/L   Glucose, Bld 98 65 - 99 mg/dL  BUN 17 6 - 20 mg/dL   Creatinine, Ser 1.17 0.61 - 1.24 mg/dL   Calcium 9.4 8.9 - 10.3 mg/dL   Total Protein 7.0 6.5 - 8.1 g/dL   Albumin 4.1 3.5 - 5.0 g/dL   AST 21 15 - 41 U/L   ALT 13 (L) 17 - 63 U/L   Alkaline Phosphatase 71 38 - 126 U/L   Total Bilirubin 0.6 0.3 - 1.2 mg/dL   GFR calc non Af Amer >60 >60 mL/min   GFR calc Af Amer >60 >60 mL/min    Comment: (NOTE) The eGFR has been calculated using the CKD EPI equation. This calculation has not been validated in all clinical situations. eGFR's persistently <60 mL/min signify possible Chronic Kidney Disease.    Anion gap 9 5 - 15  CBC with Differential/Platelet     Status: None   Collection Time: 01/07/17 10:10 AM  Result Value Ref Range   WBC 6.7 3.8 - 10.6 K/uL   RBC 4.40 4.40 - 5.90 MIL/uL   Hemoglobin 14.3 13.0 - 18.0 g/dL   HCT 41.4 40.0 - 52.0 %   MCV 94.0 80.0 - 100.0 fL   MCH 32.5 26.0 - 34.0 pg   MCHC 34.6 32.0 - 36.0 g/dL   RDW 12.4 11.5 - 14.5 %   Platelets 402 150 - 440 K/uL   Neutrophils Relative % 59 %   Neutro Abs 4.0 1.4 - 6.5 K/uL   Lymphocytes Relative 28 %   Lymphs Abs 1.9 1.0 - 3.6 K/uL   Monocytes Relative 8 %   Monocytes Absolute 0.5 0.2 - 1.0 K/uL   Eosinophils Relative 4 %   Eosinophils Absolute 0.3 0 - 0.7 K/uL   Basophils  Relative 1 %   Basophils Absolute 0.0 0 - 0.1 K/uL  Lipid panel     Status: Abnormal   Collection Time: 01/11/17  8:25 AM  Result Value Ref Range   Cholesterol 174 <200 mg/dL   HDL 51 >40 mg/dL   Triglycerides 169 (H) <150 mg/dL   LDL Cholesterol (Calc) 96 mg/dL (calc)    Comment: Reference range: <100 . Desirable range <100 mg/dL for primary prevention;   <70 mg/dL for patients with CHD or diabetic patients  with > or = 2 CHD risk factors. Marland Kitchen LDL-C is now calculated using the Martin-Hopkins  calculation, which is a validated novel method providing  better accuracy than the Friedewald equation in the  estimation of LDL-C.  Cresenciano Genre et al. Annamaria Helling. 6333;545(62): 2061-2068  (http://education.[employer].com/faq/FAQ164)    Total CHOL/HDL Ratio 3.4 <5.0 (calc)   Non-HDL Cholesterol (Calc) 123 <130 mg/dL (calc)    Comment: For patients with diabetes plus 1 major ASCVD risk  factor, treating to a non-HDL-C goal of <100 mg/dL  (LDL-C of <70 mg/dL) is considered a therapeutic  option.   TSH     Status: Abnormal   Collection Time: 01/11/17  8:25 AM  Result Value Ref Range   TSH 4.65 (H) 0.40 - 4.50 mIU/L  PSA     Status: None   Collection Time: 01/11/17  8:25 AM  Result Value Ref Range   PSA 1.7 < OR = 4.0 ng/mL    Comment: The total PSA value from this assay system is  standardized against the WHO standard. The test  result will be approximately 20% lower when compared  to the equimolar-standardized total PSA (Beckman  Coulter). Comparison of serial PSA results should be  interpreted with this fact in mind. . This  test was performed using the Siemens  chemiluminescent method. Values obtained from  different assay methods cannot be used interchangeably. PSA levels, regardless of value, should not be interpreted as absolute evidence of the presence or absence of disease.      Assessment & Plan  1. Diarrhea of presumed infectious origin - Gastrointestinal Pathogen  Panel PCR - vancomycin (VANCOCIN) 125 MG capsule; Take 1 capsule (125 mg total) by mouth 4 (four) times daily for 10 days.  Dispense: 40 capsule; Refill: 0 - Due to patient's history, report of multiple watery stools per day and recent antibiotic use, we will treat presumptively for C. Diff while awaiting testing.  Pt is aware that if testing is negative, we will notify him to stop antibiotics.  Advised to avoid imodium or other anti-diarrheals at this time, may continue bentyl. - Discussed diet and transmission in detail with patient and his wife who verbalize understanding.  2. History of Clostridium difficile colitis - Gastrointestinal Pathogen Panel PCR   - Return for 7-10 days w/ Dr. Manuella Ghazi.  Advised that he may hold Crestor for the time being, and discuss resumption of medication with Dr. Manuella Ghazi at follow up.  -Red flags and when to present for emergency care or RTC including fever >101.33F, chest pain, shortness of breath, new/worsening/un-resolving symptoms, severe abdominal pain, vomiting reviewed with patient at time of visit. Follow up and care instructions discussed and provided in AVS.

## 2017-01-25 NOTE — Telephone Encounter (Signed)
Patient has started having headache, stomach pain- with diarrhea. The only thing he has done different is taking the Crestor. Patient has hx of bowel surgery and had hx of C-diff. He did have antibiotic treatment for tooth infection 2 weeks ago. Stooped the Crestor last night. Patient is not taking OTC treatment for the diarrhea. Patient wants advisement.  Reason for Disposition . [1] SEVERE diarrhea (e.g., 7 or more times / day more than normal) AND [2]  age > 60 years  Answer Assessment - Initial Assessment Questions 1. DIARRHEA SEVERITY: "How bad is the diarrhea?" "How many extra stools have you had in the past 24 hours than normal?"    - MILD: Few loose or mushy BMs; increase of 1-3 stools over normal daily number of stools; mild increase in ostomy output.   - MODERATE: Increase of 4-6 stools daily over normal; moderate increase in ostomy output.   - SEVERE (or Worst Possible): Increase of 7 or more stools daily over normal; moderate increase in ostomy output; incontinence.     severe 2. ONSET: "When did the diarrhea begin?"      3 days ago 3. BM CONSISTENCY: "How loose or watery is the diarrhea?"      watery 4. VOMITING: "Are you also vomiting?" If so, ask: "How many times in the past 24 hours?"      no 5. ABDOMINAL PAIN: "Are you having any abdominal pain?" If yes: "What does it feel like?" (e.g., crampy, dull, intermittent, constant)      Cramping- upset stomach, patient does have intermittent pain 6. ABDOMINAL PAIN SEVERITY: If present, ask: "How bad is the pain?"  (e.g., Scale 1-10; mild, moderate, or severe)    - MILD (1-3): doesn't interfere with normal activities, abdomen soft and not tender to touch     - MODERATE (4-7): interferes with normal activities or awakens from sleep, tender to touch     - SEVERE (8-10): excruciating pain, doubled over, unable to do any normal activities       8 7. ORAL INTAKE: If vomiting, "Have you been able to drink liquids?" "How much fluids have you  had in the past 24 hours?"     Liquids- patient is eating and with meals 40-45 minutes he is going to the bathroom 8. HYDRATION: "Any signs of dehydration?" (e.g., dry mouth [not just dry lips], too weak to stand, dizziness, new weight loss) "When did you last urinate?"     Some weakness- not severe 9. EXPOSURE: "Have you traveled to a foreign country recently?" "Have you been exposed to anyone with diarrhea?" "Could you have eaten any food that was spoiled?"     no 10. OTHER SYMPTOMS: "Do you have any other symptoms?" (e.g., fever, blood in stool)       Headache-using Advil for headache which is helping 11. PREGNANCY: "Is there any chance you are pregnant?" "When was your last menstrual period?"       n/a  Protocols used: DIARRHEA-A-AH

## 2017-01-28 ENCOUNTER — Inpatient Hospital Stay (HOSPITAL_BASED_OUTPATIENT_CLINIC_OR_DEPARTMENT_OTHER): Payer: Medicare Other | Admitting: Internal Medicine

## 2017-01-28 ENCOUNTER — Encounter: Payer: Self-pay | Admitting: Internal Medicine

## 2017-01-28 VITALS — BP 137/79 | HR 60 | Temp 98.1°F | Resp 16 | Wt 127.8 lb

## 2017-01-28 DIAGNOSIS — Z79899 Other long term (current) drug therapy: Secondary | ICD-10-CM | POA: Diagnosis not present

## 2017-01-28 DIAGNOSIS — D472 Monoclonal gammopathy: Secondary | ICD-10-CM

## 2017-01-28 DIAGNOSIS — C9000 Multiple myeloma not having achieved remission: Secondary | ICD-10-CM

## 2017-01-28 DIAGNOSIS — R197 Diarrhea, unspecified: Secondary | ICD-10-CM | POA: Diagnosis not present

## 2017-01-28 DIAGNOSIS — Z8619 Personal history of other infectious and parasitic diseases: Secondary | ICD-10-CM

## 2017-01-28 DIAGNOSIS — Z87891 Personal history of nicotine dependence: Secondary | ICD-10-CM | POA: Diagnosis not present

## 2017-01-28 DIAGNOSIS — K219 Gastro-esophageal reflux disease without esophagitis: Secondary | ICD-10-CM | POA: Diagnosis not present

## 2017-01-28 LAB — GASTROINTESTINAL PATHOGEN PANEL PCR
C. difficile Tox A/B, PCR: DETECTED — AB
CAMPYLOBACTER, PCR: NOT DETECTED
Cryptosporidium, PCR: NOT DETECTED
E COLI (ETEC) LT/ST, PCR: NOT DETECTED
E coli (STEC) stx1/stx2, PCR: NOT DETECTED
E coli 0157, PCR: NOT DETECTED
GIARDIA LAMBLIA, PCR: NOT DETECTED
NOROVIRUS, PCR: NOT DETECTED
Rotavirus A, PCR: NOT DETECTED
SHIGELLA, PCR: NOT DETECTED
Salmonella, PCR: NOT DETECTED

## 2017-01-28 NOTE — Progress Notes (Signed)
[facility] OFFICE PROGRESS NOTE  Patient Care Team: Roselee Nova, MD as PCP - General (Family Medicine) Cammie Sickle, MD as Consulting Physician (Internal Medicine)   SUMMARY OF ONCOLOGIC HISTORY:  # NOV 2013- SMOLDERING MULTIPLE MYELOMA Margit Banda; BMBx- 10-12% plasma cell;Dr.Trillo Frisco]  # 2001- COLON CA STAGE III [s/p chemo; [place]]  INTERVAL HISTORY:  A very pleasant 66 year-old male patient with above history of smoldering myeloma currently being monitored is here for follow-up.  Patient states that he had a recent bout of diarrhea-which he thinks could be his flareup of C. Difficile.  He is currently on p.o. vancomycin.  Symptoms are resolving.  He recently had antibiotics for a dental procedure.  He denies any worsening of his back pain. Denies any unusual tingling or numbness of extremities. Denies any significant weight loss.He is fairly active-golfing.    REVIEW OF SYSTEMS:  A complete 10 point review of system is done which is negative except mentioned above/history of present illness.   PAST MEDICAL HISTORY :  Past Medical History:  Diagnosis Date  . Abnormal laboratory test    Sees Dr. Ma Hillock  . Acid reflux   . Allergy bee venom   mainly as a child  . Anxiety 12/08/1969  . Arthritis   . Asthma 12/08/1953   mainly as a child  . Chronic back pain   . Colon cancer ([place]) 11/1999   stage 3 s/p colon resection  . Depression   . History of chemotherapy    5-FU pump/leukovorin  . Insomnia   . Multiple myeloma ([place]) 12/2011   smoldering vs mgus  . Osteoporosis   . Seasonal allergies     PAST SURGICAL HISTORY :   Past Surgical History:  Procedure Laterality Date  . COLON SURGERY     resection, 2nd surgery for scar tissue removal  . COLONOSCOPY    . COLONOSCOPY WITH PROPOFOL N/A 03/26/2016   Procedure: COLONOSCOPY WITH PROPOFOL;  Surgeon: Lollie Sails, MD;  Location[facility] ENDOSCOPY;  Service: Endoscopy;  Laterality:  N/A;  . ESOPHAGOGASTRODUODENOSCOPY  10/2010  . EYE SURGERY  1994 and 2010   RKA in 57 and [place] in 2010  . HERNIA REPAIR  1970   lower left  . LUMBAR FUSION     L4-L5, L5-S1  . SACROILIAC JOINT FUSION    . SHOULDER SURGERY Left   . SPINAL CORD STIMULATOR IMPLANT      FAMILY HISTORY :   Family History  Problem Relation Age of Onset  . Lymphoma Mother   . Arthritis Mother        deceased  . Cancer Mother   . Lung cancer Father   . Alcohol abuse Father        deceased  . Cancer Father   . Heart disease Unknown        grandparents  . Hyperlipidemia Sister   . Diabetes Brother   . Diabetes Son   . Alcohol abuse Brother   . Arthritis Brother   . Depression Brother   . Diabetes Brother   . Alcohol abuse Sister   . Arthritis Sister   . Depression Sister   . Alcohol abuse Son   . Early death Son        truck accident 56 at age 53    SOCIAL HISTORY:   Social History   Tobacco Use  . Smoking status: Former Smoker    Packs/day: 1.00    Years: 10.00  Pack years: 10.00    Types: Cigarettes    Last attempt to quit: 03/06/1970    Years since quitting: 46.9  . Smokeless tobacco: Never Used  . Tobacco comment: started at age 89 and quit at age 89  Substance Use Topics  . Alcohol use: No    Frequency: Never    Comment: Stopped tuesday.   . Drug use: No    ALLERGIES:  is allergic to bee venom.  MEDICATIONS:  Current Outpatient Medications  Medication Sig Dispense Refill  . celecoxib (CELEBREX) 200 MG capsule Take 1 capsule (200 mg total) by mouth daily. 90 capsule 1  . Cholecalciferol (VITAMIN D) 2000 UNITS CAPS Take 1 capsule by mouth daily.    . clonazePAM (KLONOPIN) 0.5 MG tablet TAKE 1 TABLET BY MOUTH ONCE DAILY AS NEEDED FOR ANXIETY 30 tablet 0  . dicyclomine (BENTYL) 10 MG capsule TAKE 1 CAPSULE BY MOUTH TWICE DAILY WITH MEALS 180 capsule 0  . esomeprazole (NEXIUM 24HR) 20 MG capsule Take by mouth.    . Multiple Vitamins-Minerals (CENTRUM SILVER ULTRA MENS)  TABS Take 1 tablet by mouth daily.    . penicillin v potassium (VEETID) 500 MG tablet   0  . rosuvastatin (CRESTOR) 5 MG tablet Take 1 tablet (5 mg total) by mouth at bedtime. 90 tablet 0  . traZODone (DESYREL) 100 MG tablet Take 1 tablet (100 mg total) by mouth at bedtime as needed. for sleep 90 tablet 0  . vancomycin (VANCOCIN) 125 MG capsule Take 1 capsule (125 mg total) by mouth 4 (four) times daily for 10 days. 40 capsule 0  . vitamin E 400 UNIT capsule Take by mouth.     No current facility-administered medications for this visit.     PHYSICAL EXAMINATION:  BP 137/79 (BP Location: Left Arm, Patient Position: Sitting)   Pulse 60   Temp 98.1 F (36.7 C) (Tympanic)   Resp 16   Wt 127 lb 12.8 oz (58 kg)   BMI 20.02 kg/m   Filed Weights   01/28/17 1040  Weight: 127 lb 12.8 oz (58 kg)    GENERAL: Well-nourished well-developed; Alert, no distress and comfortable. Accompanied by his wife.  EYES: no pallor or icterus OROPHARYNX: no thrush or ulceration; good dentition  NECK: supple, no masses felt LYMPH:  no palpable lymphadenopathy in the cervical, axillary or inguinal regions LUNGS: clear to auscultation and  No wheeze or crackles HEART/CVS: regular rate & rhythm and no murmurs; No lower extremity edema ABDOMEN:abdomen soft, non-tender and normal bowel sounds Musculoskeletal:no cyanosis of digits and no clubbing  PSYCH: alert & oriented x 3 with fluent speech NEURO: no focal motor/sensory deficits SKIN:  no rashes or significant lesions  LABORATORY DATA:  I have reviewed the data as listed    Component Value Date/Time   NA 140 01/07/2017 1010   K 4.3 01/07/2017 1010   CL 103 01/07/2017 1010   CO2 28 01/07/2017 1010   GLUCOSE 98 01/07/2017 1010   BUN 17 01/07/2017 1010   CREATININE 1.17 01/07/2017 1010   CREATININE 1.34 (H) 12/28/2013 1428   CALCIUM 9.4 01/07/2017 1010   CALCIUM 9.6 12/28/2013 1428   PROT 7.0 01/07/2017 1010   PROT 6.7 06/27/2012 1136   ALBUMIN  4.1 01/07/2017 1010   ALBUMIN 3.5 06/27/2012 1136   AST 21 01/07/2017 1010   AST 17 06/27/2012 1136   ALT 13 (L) 01/07/2017 1010   ALT 22 06/27/2012 1136   ALKPHOS 71 01/07/2017 1010   ALKPHOS  91 06/27/2012 1136   BILITOT 0.6 01/07/2017 1010   BILITOT 0.3 06/27/2012 1136   GFRNONAA >60 01/07/2017 1010   GFRNONAA 57 (L) 12/28/2013 1428   GFRNONAA >60 06/25/2013 1413   GFRAA >60 01/07/2017 1010   GFRAA >60 12/28/2013 1428   GFRAA >60 06/25/2013 1413    No results found for: SPEP, UPEP  Lab Results  Component Value Date   WBC 6.7 01/07/2017   NEUTROABS 4.0 01/07/2017   HGB 14.3 01/07/2017   HCT 41.4 01/07/2017   MCV 94.0 01/07/2017   PLT 402 01/07/2017      Chemistry      Component Value Date/Time   NA 140 01/07/2017 1010   K 4.3 01/07/2017 1010   CL 103 01/07/2017 1010   CO2 28 01/07/2017 1010   BUN 17 01/07/2017 1010   CREATININE 1.17 01/07/2017 1010   CREATININE 1.34 (H) 12/28/2013 1428      Component Value Date/Time   CALCIUM 9.4 01/07/2017 1010   CALCIUM 9.6 12/28/2013 1428   ALKPHOS 71 01/07/2017 1010   ALKPHOS 91 06/27/2012 1136   AST 21 01/07/2017 1010   AST 17 06/27/2012 1136   ALT 13 (L) 01/07/2017 1010   ALT 22 06/27/2012 1136   BILITOT 0.6 01/07/2017 1010   BILITOT 0.3 06/27/2012 1136     Results for [place], [place] (MRN [id number]) as of 01/09/2016 11:23  Ref. Range 06/28/2014 15:14 01/03/2015 14:30 07/11/2015 11:30 07/11/2015 11:30 01/02/2016 10:25  Kappa free light chain Latest Ref Range: 3.3 - 19.4 mg/L  105.74 (H) 113.7 (H)  109.3 (H)  Lamda free light chains Latest Ref Range: 5.7 - 26.3 mg/L  13.23 13.9  13.2  Kappa, lamda light chain ratio Latest Ref Range: 0.26 - 1.65   7.99 (H) 8.18 (H)  8.28 (H)  M Protein SerPl Elph-Mcnc Latest Ref Range: Not Observed g/dL   0.3 (H)  0.3 (H)    RADIOGRAPHIC STUDIES: I have personally reviewed the radiological images as listed and agreed with the findings in the report. No results found.   ASSESSMENT & PLAN:   Smoldering multiple myeloma (SMM) ([place]) # SMOLDERING MULTIPLE MYELOMA [2013 s/p BMBx]. Clinically, patient does not seem to have progressed into active myeloma.   # CBC/creatinine calcium within normal limits- reviewed the numbers with the patient. M protein DEC 2018- 0.6 g; slightly abnormal Kappa lambda light chain ratio-123.  Patient's myeloma numbers have doubled in the last year and a half.  However absolute numbers still are quite low; and no evidence of organ dysfunction.  Recommend continued monitoring.   # Recent Diarrhea-  [Hx of C.diff]- PO vanco; seems to be improving.  # follow up in 6 months/labs- 1 week prior.   Cc: Dr.Shah.      Cammie Sickle, MD 01/28/2017 12:24 PM

## 2017-01-28 NOTE — Assessment & Plan Note (Addendum)
#  SMOLDERING MULTIPLE MYELOMA [2013 s/p BMBx]. Clinically, patient does not seem to have progressed into active myeloma.   # CBC/creatinine calcium within normal limits- reviewed the numbers with the patient. M protein DEC 2018- 0.6 g; slightly abnormal Kappa lambda light chain ratio-123.  Patient's myeloma numbers have doubled in the last year and a half.  However absolute numbers still are quite low; and no evidence of organ dysfunction.  Recommend continued monitoring.   # Recent Diarrhea-  [Hx of C.diff]- PO vanco; seems to be improving.  # follow up in 6 months/labs- 1 week prior.   Cc: Dr.Shah.

## 2017-01-30 ENCOUNTER — Telehealth: Payer: Self-pay

## 2017-01-30 NOTE — Telephone Encounter (Signed)
-----   Message from Hubbard Hartshorn, FNP sent at 01/30/2017  7:57 AM EST ----- Please notify pt that he is positive for C. Diff, and he needs to finish his Vancomycin as prescribed, and he may return if new/worsening/unimproving symptoms. Thanks!

## 2017-01-30 NOTE — Telephone Encounter (Signed)
Patient has been informed of results and Emily's message. He stated that he is feeling better but already has an appointment to follow-up with [place] on Monday. I told him that is fine and we will see him as planned. He said ok.

## 2017-02-01 ENCOUNTER — Other Ambulatory Visit: Payer: Self-pay | Admitting: Family Medicine

## 2017-02-01 DIAGNOSIS — F411 Generalized anxiety disorder: Secondary | ICD-10-CM

## 2017-02-04 ENCOUNTER — Encounter: Payer: Self-pay | Admitting: Family Medicine

## 2017-02-04 ENCOUNTER — Ambulatory Visit (INDEPENDENT_AMBULATORY_CARE_PROVIDER_SITE_OTHER): Payer: Medicare Other | Admitting: Family Medicine

## 2017-02-04 VITALS — BP 114/76 | HR 72 | Temp 97.6°F | Resp 14 | Ht 67.0 in | Wt 131.3 lb

## 2017-02-04 DIAGNOSIS — A0472 Enterocolitis due to Clostridium difficile, not specified as recurrent: Secondary | ICD-10-CM

## 2017-02-04 DIAGNOSIS — F411 Generalized anxiety disorder: Secondary | ICD-10-CM | POA: Diagnosis not present

## 2017-02-04 MED ORDER — ESCITALOPRAM OXALATE 10 MG PO TABS: 10.0000 mg | ORAL_TABLET | Freq: Every day | ORAL | 2 refills | Status: DC

## 2017-02-04 NOTE — Progress Notes (Signed)
Name: [name]   MRN: [id number]    DOB: 1950-02-17   Date:02/04/2017       Progress Note  Subjective  Chief Complaint  Chief Complaint  Patient presents with  . Follow-up    Pt was postive for C.diff on 01/30/2017.   Marland Kitchen Clostridium Difficile Infection    Doing well with antibiotics    Anxiety  Presents for follow-up visit. Patient reports no compulsions, depressed mood, excessive worry, insomnia, nervous/anxious behavior, panic or restlessness. Symptoms occur most days. The severity of symptoms is moderate and causing significant distress.    Patient will like to be switched to a non-benzodiazepine anxiolytic for symptoms of generalized anxiety as he would like to stay with this practice and does not want to have to find a psychiatrist for management of anxiety and benzodiazepines. We will start patient on Lexapro 10 mg daily after tapering off clonazepam.  Pt. Is here for testing for C.diff after taking 10 days of treatment with Vancomycin for a C.diff positive test after an episode of diarrhea, presumably caused by antibiotics for dental work.  He reports diarrhea has resolved, would like   Past Medical History:  Diagnosis Date  . Abnormal laboratory test    Sees Dr. Ma Hillock  . Acid reflux   . Allergy bee venom   mainly as a child  . Anxiety 12/08/1969  . Arthritis   . Asthma 12/08/1953   mainly as a child  . Chronic back pain   . Colon cancer ([place]) 11/1999   stage 3 s/p colon resection  . Depression   . History of chemotherapy    5-FU pump/leukovorin  . Insomnia   . Multiple myeloma ([place]) 12/2011   smoldering vs mgus  . Osteoporosis   . Seasonal allergies     Past Surgical History:  Procedure Laterality Date  . COLON SURGERY     resection, 2nd surgery for scar tissue removal  . COLONOSCOPY    . COLONOSCOPY WITH PROPOFOL N/A 03/26/2016   Procedure: COLONOSCOPY WITH PROPOFOL;  Surgeon: Lollie Sails, MD;  Location: [facility] ENDOSCOPY;  Service: Endoscopy;   Laterality: N/A;  . ESOPHAGOGASTRODUODENOSCOPY  10/2010  . EYE SURGERY  1994 and 2010   RKA in 63 and Lazik in 2010  . HERNIA REPAIR  1970   lower left  . LUMBAR FUSION     L4-L5, L5-S1  . SACROILIAC JOINT FUSION    . SHOULDER SURGERY Left   . SPINAL CORD STIMULATOR IMPLANT      Family History  Problem Relation Age of Onset  . Lymphoma Mother   . Arthritis Mother        deceased  . Cancer Mother   . Lung cancer Father   . Alcohol abuse Father        deceased  . Cancer Father   . Heart disease Unknown        grandparents  . Hyperlipidemia Sister   . Diabetes Brother   . Diabetes Son   . Alcohol abuse Brother   . Arthritis Brother   . Depression Brother   . Diabetes Brother   . Alcohol abuse Sister   . Arthritis Sister   . Depression Sister   . Alcohol abuse Son   . Early death Son        truck accident 37 at age 74    Social History   Socioeconomic History  . Marital status: Married    Spouse name: Not on file  . Number  of children: 2  . Years of education: Not on file  . Highest education level: Not on file  Social Needs  . Financial resource strain: Not hard at all  . Food insecurity - worry: Never true  . Food insecurity - inability: Never true  . Transportation needs - medical: No  . Transportation needs - non-medical: No  Occupational History  . Occupation: Retired  Tobacco Use  . Smoking status: Former Smoker    Packs/day: 1.00    Years: 10.00    Pack years: 10.00    Types: Cigarettes    Last attempt to quit: 03/06/1970    Years since quitting: 46.9  . Smokeless tobacco: Never Used  . Tobacco comment: started at age 62 and quit at age 1  Substance and Sexual Activity  . Alcohol use: No    Frequency: Never    Comment: Stopped tuesday.   . Drug use: No  . Sexual activity: Yes    Birth control/protection: None  Other Topics Concern  . Not on file  Social History Narrative  . Not on file     Current Outpatient Medications:  .   celecoxib (CELEBREX) 200 MG capsule, Take 1 capsule (200 mg total) by mouth daily., Disp: 90 capsule, Rfl: 1 .  Cholecalciferol (VITAMIN D) 2000 UNITS CAPS, Take 1 capsule by mouth daily., Disp: , Rfl:  .  clonazePAM (KLONOPIN) 0.5 MG tablet, TAKE 1 TABLET BY MOUTH ONCE DAILY AS NEEDED FOR ANXIETY, Disp: 30 tablet, Rfl: 0 .  dicyclomine (BENTYL) 10 MG capsule, TAKE 1 CAPSULE BY MOUTH TWICE DAILY WITH MEALS, Disp: 180 capsule, Rfl: 0 .  esomeprazole (NEXIUM 24HR) 20 MG capsule, Take by mouth., Disp: , Rfl:  .  Multiple Vitamins-Minerals (CENTRUM SILVER ULTRA MENS) TABS, Take 1 tablet by mouth daily., Disp: , Rfl:  .  traZODone (DESYREL) 100 MG tablet, Take 1 tablet (100 mg total) by mouth at bedtime as needed. for sleep, Disp: 90 tablet, Rfl: 0 .  penicillin v potassium (VEETID) 500 MG tablet, , Disp: , Rfl: 0 .  rosuvastatin (CRESTOR) 5 MG tablet, Take 1 tablet (5 mg total) by mouth at bedtime. (Patient not taking: Reported on 02/04/2017), Disp: 90 tablet, Rfl: 0 .  vancomycin (VANCOCIN) 125 MG capsule, Take 1 capsule (125 mg total) by mouth 4 (four) times daily for 10 days. (Patient not taking: Reported on 02/04/2017), Disp: 40 capsule, Rfl: 0 .  vitamin E 400 UNIT capsule, Take by mouth., Disp: , Rfl:   Allergies  Allergen Reactions  . Bee Venom Swelling    Bees Bees     Review of Systems  Psychiatric/Behavioral: The patient is not nervous/anxious and does not have insomnia.     Objective  Vitals:   02/04/17 1110  BP: 114/76  Pulse: 72  Resp: 14  Temp: 97.6 F (36.4 C)  TempSrc: Oral  SpO2: 96%  Weight: 131 lb 4.8 oz (59.6 kg)  Height: 5' 7"  (1.702 m)    Physical Exam  Constitutional: He is oriented to person, place, and time and well-developed, well-nourished, and in no distress.  HENT:  Head: Normocephalic and atraumatic.  Cardiovascular: Normal rate, regular rhythm and normal heart sounds.  No murmur heard. Pulmonary/Chest: Effort normal and breath sounds normal. He  has no wheezes.  Neurological: He is alert and oriented to person, place, and time.  Psychiatric: Mood, memory, affect and judgment normal.  Nursing note and vitals reviewed.      Assessment & Plan  1.  Generalized anxiety disorder Will start on Lexapro 10 mg after tapering down clonazepam, we will reassess in one month - escitalopram (LEXAPRO) 10 MG tablet; Take 1 tablet (10 mg total) by mouth daily.  Dispense: 30 tablet; Refill: 2  2. Clostridium difficile diarrhea Diarrhea has resolved after taking vancomycin, obtain stool C. difficile toxin testing for confirmation of treatment - Stool C-Diff Toxin Assay   Joia Doyle [facility] 02/04/2017 11:47 AM

## 2017-02-06 DIAGNOSIS — A0472 Enterocolitis due to Clostridium difficile, not specified as recurrent: Secondary | ICD-10-CM | POA: Diagnosis not present

## 2017-02-07 LAB — C. DIFFICILE GDH AND TOXIN A/B
GDH ANTIGEN: DETECTED
MICRO NUMBER:: 90004536
SPECIMEN QUALITY:: ADEQUATE
TOXIN A AND B: NOT DETECTED

## 2017-02-07 LAB — CLOSTRIDIUM DIFFICILE TOXIN B, QUALITATIVE, REAL-TIME PCR: Toxigenic C. Difficile by PCR: NOT DETECTED

## 2017-02-11 ENCOUNTER — Telehealth: Payer: Self-pay

## 2017-02-11 NOTE — Telephone Encounter (Signed)
Copied from [place]. Topic: General - Other >> Feb 08, 2017  2:15 PM Carolyn Stare wrote:  Pt would like a call back about his cdiff test, he said he has questions    [phone]

## 2017-02-11 NOTE — Telephone Encounter (Signed)
Left a message to give our office a call regarding his lab results.Mention to patient on message he can also speak to our nurse at our call center if he has any questions pertaining to what's mention in the test

## 2017-02-18 ENCOUNTER — Telehealth: Payer: Self-pay | Admitting: Family Medicine

## 2017-02-18 ENCOUNTER — Other Ambulatory Visit: Payer: Self-pay | Admitting: Family Medicine

## 2017-02-18 DIAGNOSIS — R197 Diarrhea, unspecified: Secondary | ICD-10-CM

## 2017-02-18 NOTE — Telephone Encounter (Signed)
According to the schedule pt has an appointment schedule with Dr Manuella Ghazi 02/19/17

## 2017-02-18 NOTE — Telephone Encounter (Signed)
We cannot refill antibiotics.  If he is still symptomatic after treatment for his C. Diff, he needs to be seen for follow up by his PCP.

## 2017-02-18 NOTE — Telephone Encounter (Signed)
Pt. Is now having watery diarrhea ,abdominal pain and frequency of loose stools. Requesting a refill of Vancomycin to be called to [facility].

## 2017-02-18 NOTE — Telephone Encounter (Signed)
Patient was evaluated and treated for C. difficile and his symptoms had apparently resolved. If he is expressing a recurrence, he needs to schedule an appointment to discuss and undergo appropriate testing

## 2017-02-19 ENCOUNTER — Ambulatory Visit (INDEPENDENT_AMBULATORY_CARE_PROVIDER_SITE_OTHER): Payer: Medicare Other | Admitting: Family Medicine

## 2017-02-19 ENCOUNTER — Encounter: Payer: Self-pay | Admitting: Family Medicine

## 2017-02-19 VITALS — BP 112/68 | HR 69 | Temp 98.4°F | Resp 16 | Wt 128.8 lb

## 2017-02-19 DIAGNOSIS — R197 Diarrhea, unspecified: Secondary | ICD-10-CM | POA: Diagnosis not present

## 2017-02-19 MED ORDER — VANCOMYCIN HCL 125 MG PO CAPS: 125.0000 mg | ORAL_CAPSULE | Freq: Four times a day (QID) | ORAL | 0 refills | Status: AC

## 2017-02-19 NOTE — Progress Notes (Signed)
Name: [name]   MRN: [id number]    DOB: 07/19/1950   Date:02/19/2017       Progress Note  Subjective  Chief Complaint  Chief Complaint  Patient presents with  . c.diff    reoccurring watery and smelly diarrhea and abdominal pain. Pt trys to eat certian stuff like yogurt to help regulate the diarrhea. Mild fever 99.8 last night. Last night last episode of watery diarrhea. started 6 days ago but became worst. Pt states he is better today.     Diarrhea   This is a new problem. The current episode started in the past 7 days (6 days ago). The problem occurs more than 10 times per day (going every 30-40 minutes. ). The problem has been gradually improving. The stool consistency is described as watery. The patient states that diarrhea awakens him from sleep. Associated symptoms include abdominal pain, chills and a fever. Pertinent negatives include no increased  flatus. Risk factors include recent antibiotic use. He has tried change of diet and increased fluids (BRAT diet.) for the symptoms. His past medical history is significant for irritable bowel syndrome.     Past Medical History:  Diagnosis Date  . Abnormal laboratory test    Sees Dr. Ma Hillock  . Acid reflux   . Allergy bee venom   mainly as a child  . Anxiety 12/08/1969  . Arthritis   . Asthma 12/08/1953   mainly as a child  . Chronic back pain   . Colon cancer ([place]) 11/1999   stage 3 s/p colon resection  . Depression   . History of chemotherapy    5-FU pump/leukovorin  . Insomnia   . Multiple myeloma ([place]) 12/2011   smoldering vs mgus  . Osteoporosis   . Seasonal allergies     Past Surgical History:  Procedure Laterality Date  . COLON SURGERY     resection, 2nd surgery for scar tissue removal  . COLONOSCOPY    . COLONOSCOPY WITH PROPOFOL N/A 03/26/2016   Procedure: COLONOSCOPY WITH PROPOFOL;  Surgeon: Lollie Sails, MD;  Location: [facility] ENDOSCOPY;  Service: Endoscopy;  Laterality: N/A;  .  ESOPHAGOGASTRODUODENOSCOPY  10/2010  . EYE SURGERY  1994 and 2010   RKA in 40 and Lazik in 2010  . HERNIA REPAIR  1970   lower left  . LUMBAR FUSION     L4-L5, L5-S1  . SACROILIAC JOINT FUSION    . SHOULDER SURGERY Left   . SPINAL CORD STIMULATOR IMPLANT      Family History  Problem Relation Age of Onset  . Lymphoma Mother   . Arthritis Mother        deceased  . Cancer Mother   . Lung cancer Father   . Alcohol abuse Father        deceased  . Cancer Father   . Heart disease Unknown        grandparents  . Hyperlipidemia Sister   . Diabetes Brother   . Diabetes Son   . Alcohol abuse Brother   . Arthritis Brother   . Depression Brother   . Diabetes Brother   . Alcohol abuse Sister   . Arthritis Sister   . Depression Sister   . Alcohol abuse Son   . Early death Son        truck accident 74 at age 23    Social History   Socioeconomic History  . Marital status: Married    Spouse name: Not on file  . Number  of children: 2  . Years of education: Not on file  . Highest education level: Not on file  Social Needs  . Financial resource strain: Not hard at all  . Food insecurity - worry: Never true  . Food insecurity - inability: Never true  . Transportation needs - medical: No  . Transportation needs - non-medical: No  Occupational History  . Occupation: Retired  Tobacco Use  . Smoking status: Former Smoker    Packs/day: 1.00    Years: 10.00    Pack years: 10.00    Types: Cigarettes    Last attempt to quit: 03/06/1970    Years since quitting: 46.9  . Smokeless tobacco: Never Used  . Tobacco comment: started at age 9 and quit at age 71  Substance and Sexual Activity  . Alcohol use: No    Frequency: Never    Comment: Stopped tuesday.   . Drug use: No  . Sexual activity: Yes    Birth control/protection: None  Other Topics Concern  . Not on file  Social History Narrative  . Not on file     Current Outpatient Medications:  .  celecoxib (CELEBREX) 200 MG  capsule, Take 1 capsule (200 mg total) by mouth daily., Disp: 90 capsule, Rfl: 1 .  Cholecalciferol (VITAMIN D) 2000 UNITS CAPS, Take 1 capsule by mouth daily., Disp: , Rfl:  .  dicyclomine (BENTYL) 10 MG capsule, TAKE 1 CAPSULE BY MOUTH TWICE DAILY WITH MEALS, Disp: 180 capsule, Rfl: 0 .  escitalopram (LEXAPRO) 10 MG tablet, Take 1 tablet (10 mg total) by mouth daily., Disp: 30 tablet, Rfl: 2 .  esomeprazole (NEXIUM 24HR) 20 MG capsule, Take by mouth., Disp: , Rfl:  .  Multiple Vitamins-Minerals (CENTRUM SILVER ULTRA MENS) TABS, Take 1 tablet by mouth daily., Disp: , Rfl:  .  traZODone (DESYREL) 100 MG tablet, Take 1 tablet (100 mg total) by mouth at bedtime as needed. for sleep, Disp: 90 tablet, Rfl: 0 .  clonazePAM (KLONOPIN) 0.5 MG tablet, TAKE 1 TABLET BY MOUTH ONCE DAILY AS NEEDED FOR ANXIETY (Patient not taking: Reported on 02/19/2017), Disp: 30 tablet, Rfl: 0  Allergies  Allergen Reactions  . Bee Venom Swelling    Bees Bees     Review of Systems  Constitutional: Positive for chills and fever.  Gastrointestinal: Positive for abdominal pain and diarrhea. Negative for flatus.     Objective  Vitals:   02/19/17 1328  BP: 112/68  Pulse: 69  Resp: 16  Temp: 98.4 F (36.9 C)  TempSrc: Oral  SpO2: 99%  Weight: 128 lb 12.8 oz (58.4 kg)    Physical Exam  Constitutional: He is oriented to person, place, and time and well-developed, well-nourished, and in no distress.  Cardiovascular: Normal rate, regular rhythm and normal heart sounds.  No murmur heard. Pulmonary/Chest: Effort normal and breath sounds normal. He has no wheezes.  Abdominal: Soft. Bowel sounds are normal. There is tenderness (generalized mild tenderness to palpation.).  Neurological: He is alert and oriented to person, place, and time.  Psychiatric: Mood, memory, affect and judgment normal.  Nursing note and vitals reviewed.      Assessment & Plan  1. Watery diarrhea Suspect recurrence of C. difficile,  will start presumptively on vancomycin, obtain pertinent stool studies - Stool C-Diff Toxin Assay - Stool Culture - Stool Giardia/Cryptosporidium - vancomycin (VANCOCIN) 125 MG capsule; Take 1 capsule (125 mg total) by mouth 4 (four) times daily for 10 days.  Dispense: 40 capsule; Refill: 0   [facility] 02/19/2017 1:43 PM

## 2017-02-22 DIAGNOSIS — R197 Diarrhea, unspecified: Secondary | ICD-10-CM | POA: Diagnosis not present

## 2017-02-25 ENCOUNTER — Other Ambulatory Visit: Payer: Self-pay

## 2017-02-25 DIAGNOSIS — Z1211 Encounter for screening for malignant neoplasm of colon: Secondary | ICD-10-CM

## 2017-02-25 LAB — POC HEMOCCULT BLD/STL (HOME/3-CARD/SCREEN)
CARD #2 DATE: 1202019
Card #1 Date: 1212019
Card #2 Fecal Occult Blod, POC: NEGATIVE
Card #3 Date: 1182019
FECAL OCCULT BLD: NEGATIVE
Fecal Occult Blood, POC: NEGATIVE

## 2017-02-25 LAB — GIARDIA/CRYPTOSPORIDIUM (EIA)
MICRO NUMBER: 90079174
MICRO NUMBER:: 90079186
RESULT: NOT DETECTED
RESULT:: NOT DETECTED
SPECIMEN QUALITY: ADEQUATE
SPECIMEN QUALITY:: ADEQUATE

## 2017-02-26 LAB — STOOL CULTURE
MICRO NUMBER: 90079554
MICRO NUMBER: 90079555
MICRO NUMBER:: 90079556
SHIGA RESULT:: NOT DETECTED
SPECIMEN QUALITY: ADEQUATE
SPECIMEN QUALITY: ADEQUATE
SPECIMEN QUALITY:: ADEQUATE

## 2017-03-10 ENCOUNTER — Other Ambulatory Visit: Payer: Self-pay | Admitting: Family Medicine

## 2017-03-10 DIAGNOSIS — R109 Unspecified abdominal pain: Secondary | ICD-10-CM

## 2017-03-11 ENCOUNTER — Ambulatory Visit: Payer: Medicare Other | Admitting: Family Medicine

## 2017-03-22 ENCOUNTER — Ambulatory Visit (INDEPENDENT_AMBULATORY_CARE_PROVIDER_SITE_OTHER): Payer: Medicare Other | Admitting: Family Medicine

## 2017-03-22 ENCOUNTER — Encounter: Payer: Self-pay | Admitting: Family Medicine

## 2017-03-22 VITALS — BP 110/72 | HR 67 | Temp 98.0°F | Resp 14 | Wt 128.9 lb

## 2017-03-22 DIAGNOSIS — F5101 Primary insomnia: Secondary | ICD-10-CM | POA: Diagnosis not present

## 2017-03-22 DIAGNOSIS — F411 Generalized anxiety disorder: Secondary | ICD-10-CM

## 2017-03-22 MED ORDER — TRAZODONE HCL 100 MG PO TABS: 100.0000 mg | ORAL_TABLET | Freq: Every evening | ORAL | 0 refills | Status: DC | PRN

## 2017-03-22 NOTE — Progress Notes (Signed)
Name: [name]   MRN: [id number]    DOB: 08-11-1950   Date:03/22/2017       Progress Note  Subjective  Chief Complaint  Chief Complaint  Patient presents with  . Follow-up    1 month  . Medication Refill  . Depression    lexapro making him nausea    Insomnia  Primary symptoms: difficulty falling asleep, no frequent awakening, no premature morning awakening.  The onset quality is gradual. The problem occurs nightly. Past treatments include medication. Typical bedtime:  8-10 P.M..  How long after going to bed to you fall asleep: 15-30 minutes.    Anxiety  Presents for follow-up visit. Symptoms include insomnia. Patient reports no depressed mood, excessive worry, irritability, nervous/anxious behavior or panic. Primary symptoms comment: has irritable stomach since colon resection. Worse recently after starting Lexapro, unsure if it's a side effect or his chronic stomach pain. . The severity of symptoms is moderate and causing significant distress.        Past Medical History:  Diagnosis Date  . Abnormal laboratory test    Sees Dr. Ma Hillock  . Acid reflux   . Allergy bee venom   mainly as a child  . Anxiety 12/08/1969  . Arthritis   . Asthma 12/08/1953   mainly as a child  . Chronic back pain   . Colon cancer ([place]) 11/1999   stage 3 s/p colon resection  . Depression   . History of chemotherapy    5-FU pump/leukovorin  . Insomnia   . Multiple myeloma ([place]) 12/2011   smoldering vs mgus  . Osteoporosis   . Seasonal allergies     Past Surgical History:  Procedure Laterality Date  . COLON SURGERY     resection, 2nd surgery for scar tissue removal  . COLONOSCOPY    . COLONOSCOPY WITH PROPOFOL N/A 03/26/2016   Procedure: COLONOSCOPY WITH PROPOFOL;  Surgeon: Lollie Sails, MD;  Location: [facility] ENDOSCOPY;  Service: Endoscopy;  Laterality: N/A;  . ESOPHAGOGASTRODUODENOSCOPY  10/2010  . EYE SURGERY  1994 and 2010   RKA in 75 and Lazik in 2010  . HERNIA REPAIR  1970   lower left  . LUMBAR FUSION     L4-L5, L5-S1  . SACROILIAC JOINT FUSION    . SHOULDER SURGERY Left   . SPINAL CORD STIMULATOR IMPLANT      Family History  Problem Relation Age of Onset  . Lymphoma Mother   . Arthritis Mother        deceased  . Cancer Mother   . Lung cancer Father   . Alcohol abuse Father        deceased  . Cancer Father   . Heart disease Unknown        grandparents  . Hyperlipidemia Sister   . Diabetes Brother   . Diabetes Son   . Alcohol abuse Brother   . Arthritis Brother   . Depression Brother   . Diabetes Brother   . Alcohol abuse Sister   . Arthritis Sister   . Depression Sister   . Alcohol abuse Son   . Early death Son        truck accident 15 at age 37    Social History   Socioeconomic History  . Marital status: Married    Spouse name: Not on file  . Number of children: 2  . Years of education: Not on file  . Highest education level: Not on file  Social Needs  . Financial  resource strain: Not hard at all  . Food insecurity - worry: Never true  . Food insecurity - inability: Never true  . Transportation needs - medical: No  . Transportation needs - non-medical: No  Occupational History  . Occupation: Retired  Tobacco Use  . Smoking status: Former Smoker    Packs/day: 1.00    Years: 10.00    Pack years: 10.00    Types: Cigarettes    Last attempt to quit: 03/06/1970    Years since quitting: 47.0  . Smokeless tobacco: Never Used  . Tobacco comment: started at age 7 and quit at age 62  Substance and Sexual Activity  . Alcohol use: No    Frequency: Never    Comment: Stopped tuesday.   . Drug use: No  . Sexual activity: Yes    Birth control/protection: None  Other Topics Concern  . Not on file  Social History Narrative  . Not on file     Current Outpatient Medications:  .  celecoxib (CELEBREX) 200 MG capsule, Take 1 capsule (200 mg total) by mouth daily., Disp: 90 capsule, Rfl: 1 .  Cholecalciferol (VITAMIN D) 2000 UNITS  CAPS, Take 1 capsule by mouth daily., Disp: , Rfl:  .  dicyclomine (BENTYL) 10 MG capsule, TAKE 1 CAPSULE BY MOUTH TWICE DAILY WITH MEALS, Disp: 180 capsule, Rfl: 0 .  escitalopram (LEXAPRO) 10 MG tablet, Take 1 tablet (10 mg total) by mouth daily., Disp: 30 tablet, Rfl: 2 .  esomeprazole (NEXIUM 24HR) 20 MG capsule, Take by mouth., Disp: , Rfl:  .  Multiple Vitamins-Minerals (CENTRUM SILVER ULTRA MENS) TABS, Take 1 tablet by mouth daily., Disp: , Rfl:  .  traZODone (DESYREL) 100 MG tablet, Take 1 tablet (100 mg total) by mouth at bedtime as needed. for sleep, Disp: 90 tablet, Rfl: 0 .  clonazePAM (KLONOPIN) 0.5 MG tablet, TAKE 1 TABLET BY MOUTH ONCE DAILY AS NEEDED FOR ANXIETY (Patient not taking: Reported on 02/19/2017), Disp: 30 tablet, Rfl: 0  Allergies  Allergen Reactions  . Bee Venom Swelling    Bees Bees     Review of Systems  Constitutional: Negative for irritability.  Psychiatric/Behavioral: The patient has insomnia. The patient is not nervous/anxious.     Objective  Vitals:   03/22/17 1120  BP: 110/72  Pulse: 67  Resp: 14  Temp: 98 F (36.7 C)  TempSrc: Oral  SpO2: 96%  Weight: 128 lb 14.4 oz (58.5 kg)    Physical Exam  Constitutional: He is oriented to person, place, and time and well-developed, well-nourished, and in no distress.  HENT:  Head: Normocephalic and atraumatic.  Cardiovascular: Normal rate, regular rhythm and normal heart sounds.  Pulmonary/Chest: Effort normal and breath sounds normal. He has no wheezes.  Abdominal: Soft. Bowel sounds are normal.  Musculoskeletal: He exhibits no edema.  Neurological: He is alert and oriented to person, place, and time.  Psychiatric: Mood, memory, affect and judgment normal.  Nursing note and vitals reviewed.         Assessment & Plan  1. Primary insomnia Symptoms in remission on trazodone - traZODone (DESYREL) 100 MG tablet; Take 1 tablet (100 mg total) by mouth at bedtime as needed. for sleep   Dispense: 90 tablet; Refill: 0  2. Generalized anxiety disorder Advised patient to discontinue Lexapro for 3-4 days and if his pain gets better than it is likely induced by the Lexapro. He should follow up with PCP.   Lewanda Perea [facility] 03/22/2017 11:39 AM

## 2017-04-19 ENCOUNTER — Encounter: Payer: Self-pay | Admitting: Family Medicine

## 2017-04-19 ENCOUNTER — Ambulatory Visit (INDEPENDENT_AMBULATORY_CARE_PROVIDER_SITE_OTHER): Payer: Medicare Other | Admitting: Family Medicine

## 2017-04-19 VITALS — BP 126/78 | HR 66 | Temp 98.4°F | Resp 16 | Ht 67.0 in | Wt 130.2 lb

## 2017-04-19 DIAGNOSIS — G4700 Insomnia, unspecified: Secondary | ICD-10-CM

## 2017-04-19 DIAGNOSIS — D472 Monoclonal gammopathy: Secondary | ICD-10-CM

## 2017-04-19 DIAGNOSIS — K219 Gastro-esophageal reflux disease without esophagitis: Secondary | ICD-10-CM

## 2017-04-19 DIAGNOSIS — F411 Generalized anxiety disorder: Secondary | ICD-10-CM | POA: Diagnosis not present

## 2017-04-19 DIAGNOSIS — C9000 Multiple myeloma not having achieved remission: Secondary | ICD-10-CM

## 2017-04-19 DIAGNOSIS — M810 Age-related osteoporosis without current pathological fracture: Secondary | ICD-10-CM | POA: Insufficient documentation

## 2017-04-19 MED ORDER — OMEPRAZOLE 40 MG PO CPDR: 40.0000 mg | DELAYED_RELEASE_CAPSULE | Freq: Every day | ORAL | 1 refills | Status: DC

## 2017-04-19 NOTE — Progress Notes (Signed)
Name: [name]   MRN: [id number]    DOB: March 25, 1950   Date:04/19/2017       Progress Note  Subjective  Chief Complaint  Chief Complaint  Patient presents with  . Establish Care    follow up medication check  . Knee Pain    left knee pain, hit knee cap on coffee table this morning    HPI  Pt is transitioning care to myself after prior PCP in our practice and would like to address the following:  Acid Reflux: Taking Nexium currently, but it is very expensive.  We discussed risk of long-term PPI use and Nexium specifically.  He is willing to try omeprazole, but is not willing to stop PPI use completely.  We will fill omeprazole at this time.  He denies regurgitation, esophageal pain or coughing, chest pain, abdominal pain.  Anxiety: Has ongoing anxiety for most of his life; he is a very competitive person and likes to bowl and golf which sometimes increases his anxiety. Taking Trazodone at night; was prescribed lexapro and this caused GI upset  - significant nausea - and he stopped after 2 weeks of the medication.  Discussed options for daily medication management, but at this time he would like to only take trazodone and avoid SSRI's as he has had GI upset in the past.  Smoldering Multiple Myeloma: Seeing Dr. Rogue Bussing - most recent visit was 01/28/2018; follows up every 6 months, pt states course is mostly stable.  Patient Active Problem List   Diagnosis Date Noted  . Abdominal spasms 01/16/2016  . Smoldering multiple myeloma (SMM) ([place]) 01/09/2016  . Arthritis 05/18/2015  . Hyperglycemia 02/16/2015  . Anxiety disorder 10/04/2014  . Allergic rhinitis 10/04/2014  . Acid reflux 10/04/2014  . Insomnia 09/06/2014    Past Surgical History:  Procedure Laterality Date  . COLON SURGERY     resection, 2nd surgery for scar tissue removal  . COLONOSCOPY    . COLONOSCOPY WITH PROPOFOL N/A 03/26/2016   Procedure: COLONOSCOPY WITH PROPOFOL;  Surgeon: Lollie Sails, MD;  Location:  [facility] ENDOSCOPY;  Service: Endoscopy;  Laterality: N/A;  . ESOPHAGOGASTRODUODENOSCOPY  10/2010  . EYE SURGERY  1994 and 2010   RKA in 6 and [place] in 2010  . HERNIA REPAIR  1970   lower left  . LUMBAR FUSION     L4-L5, L5-S1  . SACROILIAC JOINT FUSION    . SHOULDER SURGERY Left   . SPINAL CORD STIMULATOR IMPLANT      Family History  Problem Relation Age of Onset  . Lymphoma Mother   . Arthritis Mother        deceased  . Cancer Mother   . Lung cancer Father   . Alcohol abuse Father        deceased  . Cancer Father   . Heart disease Unknown        grandparents  . Hyperlipidemia Sister   . Diabetes Brother   . Diabetes Son   . Alcohol abuse Brother   . Arthritis Brother   . Depression Brother   . Diabetes Brother   . Alcohol abuse Sister   . Arthritis Sister   . Depression Sister   . Alcohol abuse Son   . Early death Son        truck accident 5 at age 20    Social History   Socioeconomic History  . Marital status: Married    Spouse name: Not on file  . Number of children:  2  . Years of education: Not on file  . Highest education level: Not on file  Social Needs  . Financial resource strain: Not hard at all  . Food insecurity - worry: Never true  . Food insecurity - inability: Never true  . Transportation needs - medical: No  . Transportation needs - non-medical: No  Occupational History  . Occupation: Retired  Tobacco Use  . Smoking status: Former Smoker    Packs/day: 1.00    Years: 10.00    Pack years: 10.00    Types: Cigarettes    Last attempt to quit: 03/06/1970    Years since quitting: 47.1  . Smokeless tobacco: Never Used  . Tobacco comment: started at age 64 and quit at age 72  Substance and Sexual Activity  . Alcohol use: No    Frequency: Never    Comment: Stopped tuesday.   . Drug use: No  . Sexual activity: Yes    Birth control/protection: None  Other Topics Concern  . Not on file  Social History Narrative  . Not on file      Current Outpatient Medications:  .  celecoxib (CELEBREX) 200 MG capsule, Take 1 capsule (200 mg total) by mouth daily., Disp: 90 capsule, Rfl: 1 .  Cholecalciferol (VITAMIN D) 2000 UNITS CAPS, Take 1 capsule by mouth daily., Disp: , Rfl:  .  dicyclomine (BENTYL) 10 MG capsule, TAKE 1 CAPSULE BY MOUTH TWICE DAILY WITH MEALS, Disp: 180 capsule, Rfl: 0 .  esomeprazole (NEXIUM 24HR) 20 MG capsule, Take by mouth., Disp: , Rfl:  .  Multiple Vitamins-Minerals (CENTRUM SILVER ULTRA MENS) TABS, Take 1 tablet by mouth daily., Disp: , Rfl:  .  traZODone (DESYREL) 100 MG tablet, Take 1 tablet (100 mg total) by mouth at bedtime as needed. for sleep, Disp: 90 tablet, Rfl: 0 .  escitalopram (LEXAPRO) 10 MG tablet, Take 1 tablet (10 mg total) by mouth daily. (Patient not taking: Reported on 04/19/2017), Disp: 30 tablet, Rfl: 2  Allergies  Allergen Reactions  . Bee Venom Swelling    Bees Bees    ROS Constitutional: Negative for fever or weight change.  Respiratory: Negative for cough and shortness of breath.   Cardiovascular: Negative for chest pain or palpitations.  Gastrointestinal: Negative for abdominal pain, no bowel changes.  Musculoskeletal: Negative for gait problem or joint swelling.  Skin: Negative for rash.  Neurological: Negative for dizziness or headache.  No other specific complaints in a complete review of systems (except as listed in HPI above).  Objective  Vitals:   04/19/17 1102  BP: 126/78  Pulse: 66  Resp: 16  Temp: 98.4 F (36.9 C)  TempSrc: Oral  SpO2: 95%  Weight: 130 lb 3.2 oz (59.1 kg)  Height: 5' 7" (1.702 m)    Body mass index is 20.39 kg/m.  Physical Exam Constitutional: Patient appears well-developed and well-nourished.  No distress.  HEENT: head atraumatic, normocephalic, pupils equal and reactive to light. Cardiovascular: Normal rate, regular rhythm and normal heart sounds.  No murmur heard. No BLE edema. Pulmonary/Chest: Effort normal and breath  sounds normal. No respiratory distress. Abdominal: Soft.  There is no tenderness. Psychiatric: Patient has a normal mood and affect. behavior is normal. Judgment and thought content normal.  No results found for this or any previous visit (from the past 72 hour(s)).  PHQ2/9: Depression screen [facility] 2/9 03/22/2017 02/19/2017 01/25/2017 01/09/2017 01/09/2017  Decreased Interest 0 0 0 0 0  Down, Depressed, Hopeless 0 1 1 2  0  PHQ - 2 Score 0 _0 0  Altered sleeping - - - 0 -  Tired, decreased energy - - - 0 -  Change in appetite - - - 0 -  Feeling bad or failure about yourself  - - - 0 -  Trouble concentrating - - - 0 -  Moving slowly or fidgety/restless - - - 0 -  Suicidal thoughts - - - 0 -  PHQ-9 Score - - - 2 -  Difficult doing work/chores - - Not difficult at all Not difficult at all -    Fall Risk: Fall Risk  03/22/2017 02/19/2017 01/25/2017 01/09/2017 01/09/2017  Falls in the past year? _1     Assessment & Plan  1. Gastroesophageal reflux disease without esophagitis - omeprazole (PRILOSEC) 40 MG capsule; Take 1 capsule (40 mg total) by mouth daily.  Dispense: 90 capsule; Refill: 1  2. Insomnia, unspecified type - Continue Trazodone PRN  3. Smoldering multiple myeloma (SMM) ([place]) - Keep oncology follow up.  4. Generalized anxiety disorder - Declines new medication regimen at this time.  Discussed Trintellix as an alternative to SSRI, he would like to read about this medication prior to deciding if he would like to start.

## 2017-04-19 NOTE — Patient Instructions (Addendum)
Vortioxetine oral tablet  What is this medicine?  Vortioxetine (vor tee OX e teen) is used to treat depression.  This medicine may be used for other purposes; ask your health care provider or pharmacist if you have questions.  COMMON BRAND NAME(S): BRINTELLIX, Trintellix  What should I tell my health care provider before I take this medicine?  They need to know if you have any of these conditions:  -bipolar disorder or a family history of bipolar disorder  -bleeding disorders  -drink alcohol  -glaucoma  -liver disease  -low levels of sodium in the blood  -seizures  -suicidal thoughts, plans, or attempt; a previous suicide attempt by you or a family member  -take medicines that treat or prevent blood clots  -an unusual or allergic reaction to vortioxetine, other medicines, foods, dyes, or preservatives  -pregnant or trying to get pregnant  -breast-feeding  How should I use this medicine?  Take this medicine by mouth with a glass of water. Follow the directions on the prescription label. You can take it with or without food. If it upsets your stomach, take it with food. Take your medicine at regular intervals. Do not take it more often than directed. Do not stop taking this medicine suddenly except upon the advice of your doctor. Stopping this medicine too quickly may cause serious side effects or your condition may worsen.  A special MedGuide will be given to you by the pharmacist with each prescription and refill. Be sure to read this information carefully each time.  Talk to your pediatrician regarding the use of this medicine in children. Special care may be needed.  Overdosage: If you think you have taken too much of this medicine contact a poison control center or emergency room at once.  NOTE: This medicine is only for you. Do not share this medicine with others.  What if I miss a dose?  If you miss a dose, take it as soon as you can. If it is almost time for your next dose, take only that dose. Do not take  double or extra doses.  What may interact with this medicine?  Do not take this medicine with any of the following medications:  -linezolid  -MAOIs like Carbex, Eldepryl, Marplan, Nardil, and Parnate  -methylene blue (injected into a vein)  This medicine may also interact with the following medications:  -alcohol  -aspirin and aspirin-like medicines  -carbamazepine  -certain medicines for depression, anxiety, or psychotic disturbances  -certain medicines for migraine headache like almotriptan, eletriptan, frovatriptan, naratriptan, rizatriptan, sumatriptan, zolmitriptan  -diuretics  -fentanyl  -furazolidone  -isoniazid  -medicines that treat or prevent blood clots like warfarin, enoxaparin, and dalteparin  -NSAIDs, medicines for pain and inflammation, like ibuprofen or naproxen  -phenytoin  -procarbazine  -quinidine  -rasagiline  -rifampin  -supplements like St. John's wort, kava kava, valerian  -tramadol  -tryptophan  This list may not describe all possible interactions. Give your health care provider a list of all the medicines, herbs, non-prescription drugs, or dietary supplements you use. Also tell them if you smoke, drink alcohol, or use illegal drugs. Some items may interact with your medicine.  What should I watch for while using this medicine?  Tell your doctor if your symptoms do not get better or if they get worse. Visit your doctor or health care professional for regular checks on your progress. Because it may take several weeks to see the full effects of this medicine, it is important to continue your treatment   especially at the beginning of  treatment or after a change in dose, call your health care professional. You may get drowsy or dizzy. Do not drive, use machinery, or do anything that needs mental alertness until you know how this medicine affects you. Do not stand or sit up quickly, especially if you are an older patient. This reduces the risk of dizzy or fainting spells. Alcohol may interfere with the effect of this medicine. Avoid alcoholic drinks. Your mouth may get dry. Chewing sugarless gum or sucking hard candy, and drinking plenty of water may help. Contact your doctor if the problem does not go away or is severe. What side effects may I notice from receiving this medicine? Side effects that you should report to your doctor or health care professional as soon as possible: -allergic reactions like skin rash, itching or hives, swelling of the face, lips, or tongue -anxious -black, tarry stools -changes in vision -confusion -elevated mood, decreased need for sleep, racing thoughts, impulsive behavior -eye pain -fast, irregular heartbeat -feeling faint or lightheaded, falls -feeling agitated, angry, or irritable -hallucination, loss of contact with reality -loss of balance or coordination -loss of memory -painful or prolonged erections -restlessness, pacing, inability to keep still -seizures -stiff muscles -suicidal thoughts or other mood changes -trouble sleeping -unusual bleeding or bruising -unusually weak or tired -vomiting Side effects that usually do not require medical attention (report to your doctor or health care professional if they continue or are bothersome): -change in appetite or weight -change in sex drive or performance -constipation -dizziness -dry mouth -nausea This list may not describe all possible side effects. Call your doctor for medical advice about side effects. You may report side effects to FDA at 1-800-FDA-1088. Where should I keep my medicine? Keep out of the reach of  children. Store at room temperature between 15 and 30 degrees C (59 and 86 degrees F). Throw away any unused medicine after the expiration date. NOTE: This sheet is a summary. It may not cover all possible information. If you have questions about this medicine, talk to your doctor, pharmacist, or health care provider.  2018 Elsevier/Gold Standard (2015-06-23 16:45:13)  

## 2017-07-11 ENCOUNTER — Telehealth: Payer: Self-pay | Admitting: Family Medicine

## 2017-07-11 NOTE — Telephone Encounter (Unsigned)
Copied from [place] [id number]. Topic: Quick Communication - Rx Refill/Question >> Jul 11, 2017 11:53 AM Neva Seat wrote:  dicyclomine (BENTYL) 10 MG capsule   Pt requested refill last week.  Pt is now out of medication. Needing refills asap.  [facility] [address], [place] - [place] [address] Phone: [phone] Fax: [phone]

## 2017-07-12 ENCOUNTER — Other Ambulatory Visit: Payer: Self-pay | Admitting: Nurse Practitioner

## 2017-07-12 DIAGNOSIS — R109 Unspecified abdominal pain: Secondary | ICD-10-CM

## 2017-07-12 MED ORDER — DICYCLOMINE HCL 10 MG PO CAPS: 10.0000 mg | ORAL_CAPSULE | Freq: Two times a day (BID) | ORAL | 0 refills | Status: DC

## 2017-07-12 NOTE — Telephone Encounter (Signed)
Pt called to f/u on RX. He requested from pharmacy 07/06/17 and they told him it was faxed to office. Pt has been out for 2 days. This is for stomach spasms and he is having this issue. Please advise.  [facility] [address], [place] [phone] (Phone) [phone] (Fax)

## 2017-07-12 NOTE — Telephone Encounter (Signed)
Please refill.

## 2017-07-22 ENCOUNTER — Inpatient Hospital Stay: Payer: Medicare Other | Attending: Internal Medicine

## 2017-07-22 ENCOUNTER — Other Ambulatory Visit: Payer: Self-pay

## 2017-07-22 DIAGNOSIS — C9000 Multiple myeloma not having achieved remission: Secondary | ICD-10-CM | POA: Diagnosis not present

## 2017-07-22 DIAGNOSIS — R944 Abnormal results of kidney function studies: Secondary | ICD-10-CM | POA: Diagnosis not present

## 2017-07-22 DIAGNOSIS — Z85038 Personal history of other malignant neoplasm of large intestine: Secondary | ICD-10-CM | POA: Diagnosis not present

## 2017-07-22 DIAGNOSIS — D472 Monoclonal gammopathy: Secondary | ICD-10-CM

## 2017-07-22 LAB — COMPREHENSIVE METABOLIC PANEL WITH GFR
ALT: 22 U/L (ref 17–63)
AST: 28 U/L (ref 15–41)
Albumin: 4.2 g/dL (ref 3.5–5.0)
Alkaline Phosphatase: 68 U/L (ref 38–126)
Anion gap: 7 (ref 5–15)
BUN: 30 mg/dL — ABNORMAL HIGH (ref 6–20)
CO2: 26 mmol/L (ref 22–32)
Calcium: 9.2 mg/dL (ref 8.9–10.3)
Chloride: 102 mmol/L (ref 101–111)
Creatinine, Ser: 1.46 mg/dL — ABNORMAL HIGH (ref 0.61–1.24)
GFR calc Af Amer: 55 mL/min — ABNORMAL LOW (ref >60)
GFR calc non Af Amer: 48 mL/min — ABNORMAL LOW (ref >60)
Glucose, Bld: 132 mg/dL — ABNORMAL HIGH (ref 65–99)
Potassium: 4.3 mmol/L (ref 3.5–5.1)
Sodium: 135 mmol/L (ref 135–145)
Total Bilirubin: 0.6 mg/dL (ref 0.3–1.2)
Total Protein: 6.9 g/dL (ref 6.5–8.1)

## 2017-07-22 LAB — CBC WITH DIFFERENTIAL/PLATELET
BASOS ABS: 0 10*3/uL (ref 0–0.1)
Basophils Relative: 1 %
Eosinophils Absolute: 0.1 10*3/uL (ref 0–0.7)
Eosinophils Relative: 2 %
HCT: 39.9 % — ABNORMAL LOW (ref 40.0–52.0)
HEMOGLOBIN: 14.1 g/dL (ref 13.0–18.0)
LYMPHS ABS: 1.6 10*3/uL (ref 1.0–3.6)
LYMPHS PCT: 24 %
MCH: 33.8 pg (ref 26.0–34.0)
MCHC: 35.3 g/dL (ref 32.0–36.0)
MCV: 95.9 fL (ref 80.0–100.0)
Monocytes Absolute: 0.6 10*3/uL (ref 0.2–1.0)
Monocytes Relative: 9 %
NEUTROS PCT: 64 %
Neutro Abs: 4.3 10*3/uL (ref 1.4–6.5)
Platelets: 359 10*3/uL (ref 150–440)
RBC: 4.16 MIL/uL — AB (ref 4.40–5.90)
RDW: 12.3 % (ref 11.5–14.5)
WBC: 6.7 10*3/uL (ref 3.8–10.6)

## 2017-07-23 LAB — MULTIPLE MYELOMA PANEL, SERUM
ALBUMIN SERPL ELPH-MCNC: 4.1 g/dL (ref 2.9–4.4)
Albumin/Glob SerPl: 1.8 — ABNORMAL HIGH (ref 0.7–1.7)
Alpha 1: 0.2 g/dL (ref 0.0–0.4)
Alpha2 Glob SerPl Elph-Mcnc: 0.6 g/dL (ref 0.4–1.0)
B-GLOBULIN SERPL ELPH-MCNC: 0.9 g/dL (ref 0.7–1.3)
GAMMA GLOB SERPL ELPH-MCNC: 0.8 g/dL (ref 0.4–1.8)
GLOBULIN, TOTAL: 2.4 g/dL (ref 2.2–3.9)
IGA: 368 mg/dL (ref 61–437)
IgG (Immunoglobin G), Serum: 732 mg/dL (ref 700–1600)
IgM (Immunoglobulin M), Srm: 64 mg/dL (ref 20–172)
M PROTEIN SERPL ELPH-MCNC: 0.3 g/dL — AB
Total Protein ELP: 6.5 g/dL (ref 6.0–8.5)

## 2017-07-23 LAB — KAPPA/LAMBDA LIGHT CHAINS
KAPPA FREE LGHT CHN: 129.5 mg/L — AB (ref 3.3–19.4)
KAPPA, LAMDA LIGHT CHAIN RATIO: 10.36 — AB (ref 0.26–1.65)
LAMDA FREE LIGHT CHAINS: 12.5 mg/L (ref 5.7–26.3)

## 2017-07-29 ENCOUNTER — Encounter: Payer: Self-pay | Admitting: Internal Medicine

## 2017-07-29 ENCOUNTER — Inpatient Hospital Stay (HOSPITAL_BASED_OUTPATIENT_CLINIC_OR_DEPARTMENT_OTHER): Payer: Medicare Other | Admitting: Internal Medicine

## 2017-07-29 VITALS — BP 143/83 | HR 62 | Temp 98.1°F | Resp 16 | Wt 132.2 lb

## 2017-07-29 DIAGNOSIS — R944 Abnormal results of kidney function studies: Secondary | ICD-10-CM | POA: Diagnosis not present

## 2017-07-29 DIAGNOSIS — D472 Monoclonal gammopathy: Secondary | ICD-10-CM

## 2017-07-29 DIAGNOSIS — Z85038 Personal history of other malignant neoplasm of large intestine: Secondary | ICD-10-CM | POA: Diagnosis not present

## 2017-07-29 DIAGNOSIS — C9000 Multiple myeloma not having achieved remission: Secondary | ICD-10-CM | POA: Diagnosis not present

## 2017-07-29 NOTE — Progress Notes (Signed)
[facility] OFFICE PROGRESS NOTE  Patient Care Team: Hubbard Hartshorn, FNP as PCP - General (Family Medicine) Cammie Sickle, MD as Consulting Physician (Internal Medicine)  Cancer Staging No matching staging information was found for the patient.   Oncology History   # NOV 2013- SMOLDERING MULTIPLE MYELOMA [IgA Kappa; BMBx- 10-12% plasma cell;Dr.Trillo Frisco]  # 2001- COLON CA STAGE III [s/p chemo; [facility]]     Smoldering multiple myeloma (SMM) ([facility])      INTERVAL HISTORY:  [name] 67 y.o.  male pleasant patient above history of smoldering myeloma currently on surveillance is here for follow-up.  Patient denies any blood in stools black or stools.  Denies any unusual fatigue.  Denies any bone pain.  No nausea no vomiting.  Review of Systems  Constitutional: Negative for chills, diaphoresis, fever, malaise/fatigue and weight loss.  HENT: Negative for nosebleeds and sore throat.   Eyes: Negative for double vision.  Respiratory: Negative for cough, hemoptysis, sputum production, shortness of breath and wheezing.   Cardiovascular: Negative for chest pain, palpitations, orthopnea and leg swelling.  Gastrointestinal: Negative for abdominal pain, blood in stool, constipation, diarrhea, heartburn, melena, nausea and vomiting.  Genitourinary: Negative for dysuria, frequency and urgency.  Musculoskeletal: Negative for back pain and joint pain.  Skin: Negative.  Negative for itching and rash.  Neurological: Negative for dizziness, tingling, focal weakness, weakness and headaches.  Endo/Heme/Allergies: Does not bruise/bleed easily.  Psychiatric/Behavioral: Negative for depression. The patient is not nervous/anxious and does not have insomnia.       PAST MEDICAL HISTORY :  Past Medical History:  Diagnosis Date  . Abnormal laboratory test    Sees Dr. Ma Hillock  . Acid reflux   . Allergy bee venom   mainly as a child  . Anxiety 12/08/1969  . Arthritis    . Asthma 12/08/1953   mainly as a child  . Chronic back pain   . Colon cancer ([place]) 11/1999   stage 3 s/p colon resection  . Depression   . History of chemotherapy    5-FU pump/leukovorin  . Insomnia   . Multiple myeloma ([place]) 12/2011   smoldering vs mgus  . Osteoporosis   . Seasonal allergies     PAST SURGICAL HISTORY :   Past Surgical History:  Procedure Laterality Date  . COLON SURGERY     resection, 2nd surgery for scar tissue removal  . COLONOSCOPY    . COLONOSCOPY WITH PROPOFOL N/A 03/26/2016   Procedure: COLONOSCOPY WITH PROPOFOL;  Surgeon: Lollie Sails, MD;  Location: [facility] ENDOSCOPY;  Service: Endoscopy;  Laterality: N/A;  . ESOPHAGOGASTRODUODENOSCOPY  10/2010  . EYE SURGERY  1994 and 2010   RKA in 52 and [place] in 2010  . HERNIA REPAIR  1970   lower left  . LUMBAR FUSION     L4-L5, L5-S1  . SACROILIAC JOINT FUSION    . SHOULDER SURGERY Left   . SPINAL CORD STIMULATOR IMPLANT      FAMILY HISTORY :   Family History  Problem Relation Age of Onset  . Lymphoma Mother   . Arthritis Mother        deceased  . Cancer Mother   . Lung cancer Father   . Alcohol abuse Father        deceased  . Cancer Father   . Heart disease Unknown        grandparents  . Hyperlipidemia Sister   . Diabetes Brother   . Diabetes Son   .  Alcohol abuse Brother   . Arthritis Brother   . Depression Brother   . Diabetes Brother   . Alcohol abuse Sister   . Arthritis Sister   . Depression Sister   . Alcohol abuse Son   . Early death Son        truck accident 68 at age 77    SOCIAL HISTORY:   Social History   Tobacco Use  . Smoking status: Former Smoker    Packs/day: 1.00    Years: 10.00    Pack years: 10.00    Types: Cigarettes    Last attempt to quit: 03/06/1970    Years since quitting: 47.4  . Smokeless tobacco: Never Used  . Tobacco comment: started at age 76 and quit at age 42  Substance Use Topics  . Alcohol use: No    Frequency: Never    Comment: Stopped  tuesday.   . Drug use: No    ALLERGIES:  is allergic to bee venom.  MEDICATIONS:  Current Outpatient Medications  Medication Sig Dispense Refill  . celecoxib (CELEBREX) 200 MG capsule Take 1 capsule (200 mg total) by mouth daily. 90 capsule 1  . Cholecalciferol (VITAMIN D) 2000 UNITS CAPS Take 1 capsule by mouth daily.    Marland Kitchen dicyclomine (BENTYL) 10 MG capsule Take 1 capsule (10 mg total) by mouth 2 (two) times daily. 180 capsule 0  . esomeprazole (NEXIUM 24HR) 20 MG capsule Take by mouth.    . Multiple Vitamins-Minerals (CENTRUM SILVER ULTRA MENS) TABS Take 1 tablet by mouth daily.    Marland Kitchen omeprazole (PRILOSEC) 40 MG capsule Take 1 capsule (40 mg total) by mouth daily. 90 capsule 1  . traZODone (DESYREL) 100 MG tablet Take 1 tablet (100 mg total) by mouth at bedtime as needed. for sleep 90 tablet 0   No current facility-administered medications for this visit.     PHYSICAL EXAMINATION: ECOG PERFORMANCE STATUS: 0 - Asymptomatic  BP (!) 143/83 (BP Location: Left Arm, Patient Position: Sitting)   Pulse 62   Temp 98.1 F (36.7 C) (Tympanic)   Resp 16   Wt 132 lb 3.2 oz (60 kg)   BMI 20.71 kg/m   Filed Weights   07/29/17 1128  Weight: 132 lb 3.2 oz (60 kg)    GENERAL: Well-nourished well-developed; Alert, no distress and comfortable.  Accompanied by his wife. EYES: no pallor or icterus OROPHARYNX: no thrush or ulceration; NECK: supple; no lymph nodes felt. LYMPH:  no palpable lymphadenopathy in the axillary or inguinal regions LUNGS: Decreased breath sounds auscultation bilaterally. No wheeze or crackles HEART/CVS: regular rate & rhythm and no murmurs; No lower extremity edema ABDOMEN:abdomen soft, non-tender and normal bowel sounds. No hepatomegaly or splenomegaly.  Musculoskeletal:no cyanosis of digits and no clubbing  PSYCH: alert & oriented x 3 with fluent speech NEURO: no focal motor/sensory deficits SKIN:  no rashes or significant lesions    LABORATORY DATA:  I have  reviewed the data as listed    Component Value Date/Time   NA 135 07/22/2017 1108   K 4.3 07/22/2017 1108   CL 102 07/22/2017 1108   CO2 26 07/22/2017 1108   GLUCOSE 132 (H) 07/22/2017 1108   BUN 30 (H) 07/22/2017 1108   CREATININE 1.46 (H) 07/22/2017 1108   CREATININE 1.34 (H) 12/28/2013 1428   CALCIUM 9.2 07/22/2017 1108   CALCIUM 9.6 12/28/2013 1428   PROT 6.9 07/22/2017 1108   PROT 6.7 06/27/2012 1136   ALBUMIN 4.2 07/22/2017 1108   ALBUMIN  3.5 06/27/2012 1136   AST 28 07/22/2017 1108   AST 17 06/27/2012 1136   ALT 22 07/22/2017 1108   ALT 22 06/27/2012 1136   ALKPHOS 68 07/22/2017 1108   ALKPHOS 91 06/27/2012 1136   BILITOT 0.6 07/22/2017 1108   BILITOT 0.3 06/27/2012 1136   GFRNONAA 48 (L) 07/22/2017 1108   GFRNONAA 57 (L) 12/28/2013 1428   GFRNONAA >60 06/25/2013 1413   GFRAA 55 (L) 07/22/2017 1108   GFRAA >60 12/28/2013 1428   GFRAA >60 06/25/2013 1413    No results found for: SPEP, UPEP  Lab Results  Component Value Date   WBC 6.7 07/22/2017   NEUTROABS 4.3 07/22/2017   HGB 14.1 07/22/2017   HCT 39.9 (L) 07/22/2017   MCV 95.9 07/22/2017   PLT 359 07/22/2017      Chemistry      Component Value Date/Time   NA 135 07/22/2017 1108   K 4.3 07/22/2017 1108   CL 102 07/22/2017 1108   CO2 26 07/22/2017 1108   BUN 30 (H) 07/22/2017 1108   CREATININE 1.46 (H) 07/22/2017 1108   CREATININE 1.34 (H) 12/28/2013 1428      Component Value Date/Time   CALCIUM 9.2 07/22/2017 1108   CALCIUM 9.6 12/28/2013 1428   ALKPHOS 68 07/22/2017 1108   ALKPHOS 91 06/27/2012 1136   AST 28 07/22/2017 1108   AST 17 06/27/2012 1136   ALT 22 07/22/2017 1108   ALT 22 06/27/2012 1136   BILITOT 0.6 07/22/2017 1108   BILITOT 0.3 06/27/2012 1136       RADIOGRAPHIC STUDIES: I have personally reviewed the radiological images as listed and agreed with the findings in the report. No results found.   ASSESSMENT & PLAN:  Smoldering multiple myeloma (SMM) ([place]) # SMOLDERING  MULTIPLE MYELOMA [2013 s/p BMBx].  No clinical evidence of progression.  Stable.  #M protein 0.3 g/dL; kappa lambda light chain ratio on 10.  Hemoglobin calcium normal.  Slightly elevated creatinine at 1.4 [see discussion below].  #Slightly elevated creatinine 1.4 intermittent; recommend increased fluid intake avoid NSAIDs if possible.  # follow up in 6 months/labs- 1 week prior.   Cc: Dr.Shah.    Orders Placed This Encounter  Procedures  . CBC with Differential/Platelet    Standing Status:   Future    Standing Expiration Date:   07/30/2018  . Comprehensive metabolic panel    Standing Status:   Future    Standing Expiration Date:   07/30/2018  . Kappa/lambda light chains    Standing Status:   Future    Standing Expiration Date:   07/30/2018  . Multiple Myeloma Panel (SPEP&IFE w/QIG)    Standing Status:   Future    Standing Expiration Date:   07/30/2018   All questions were answered. The patient knows to call the clinic with any problems, questions or concerns.      Cammie Sickle, MD 08/04/2017 5:21 PM

## 2017-07-29 NOTE — Assessment & Plan Note (Addendum)
#  SMOLDERING MULTIPLE MYELOMA [2013 s/p BMBx].  No clinical evidence of progression.  Stable.  #M protein 0.3 g/dL; kappa lambda light chain ratio on 10.  Hemoglobin calcium normal.  Slightly elevated creatinine at 1.4 [see discussion below].  #Slightly elevated creatinine 1.4 intermittent; recommend increased fluid intake avoid NSAIDs if possible.  # follow up in 6 months/labs- 1 week prior.   Cc: Dr.Shah.

## 2017-08-19 ENCOUNTER — Encounter: Payer: Self-pay | Admitting: Nurse Practitioner

## 2017-08-19 ENCOUNTER — Ambulatory Visit (INDEPENDENT_AMBULATORY_CARE_PROVIDER_SITE_OTHER): Payer: Medicare Other | Admitting: Nurse Practitioner

## 2017-08-19 VITALS — BP 140/80 | HR 79 | Temp 98.3°F | Resp 16 | Ht 67.0 in | Wt 132.4 lb

## 2017-08-19 DIAGNOSIS — F324 Major depressive disorder, single episode, in partial remission: Secondary | ICD-10-CM

## 2017-08-19 DIAGNOSIS — Z85038 Personal history of other malignant neoplasm of large intestine: Secondary | ICD-10-CM | POA: Diagnosis not present

## 2017-08-19 DIAGNOSIS — F5101 Primary insomnia: Secondary | ICD-10-CM | POA: Diagnosis not present

## 2017-08-19 DIAGNOSIS — M1990 Unspecified osteoarthritis, unspecified site: Secondary | ICD-10-CM | POA: Diagnosis not present

## 2017-08-19 DIAGNOSIS — F411 Generalized anxiety disorder: Secondary | ICD-10-CM | POA: Diagnosis not present

## 2017-08-19 MED ORDER — DULOXETINE HCL 20 MG PO CPEP: 20.0000 mg | ORAL_CAPSULE | Freq: Every day | ORAL | 2 refills | Status: DC

## 2017-08-19 MED ORDER — TRAZODONE HCL 100 MG PO TABS: 100.0000 mg | ORAL_TABLET | Freq: Every evening | ORAL | 1 refills | Status: DC | PRN

## 2017-08-19 MED ORDER — CELECOXIB 200 MG PO CAPS
200.0000 mg | ORAL_CAPSULE | Freq: Every day | ORAL | 1 refills | Status: DC
Start: 2017-08-19 — End: 2018-03-03

## 2017-08-19 NOTE — Progress Notes (Addendum)
Name: [name]   MRN: [id number]    DOB: [birth year]   Date:08/19/2017       Progress Note  Subjective  Chief Complaint  Chief Complaint  Patient presents with  . Gastroesophageal Reflux  . Knee Pain  . Medication Refill    Needs refills on Celebrex and Trazodone.    HPI  GERD: takes esomeprazole 30m daily; food triggers- spicy foods but still eats them. Take a pepcid before bed every night.  Insomnia: takes trazadone 1049mevery evening- average 8 hours of sleep a night; medicines help him get to sleep faster and sleep longer. 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 States has dealt with depression and anxiety for a long time- has lost children in the past and it has been hard for him to deal with it. States has just been trying to find a way to cope but doesn't feel like it is well- managed at all.  Depression screen PHQ 2/9 08/19/2017  Decreased Interest 1  Down, Depressed, Hopeless 2  PHQ - 2 Score 3  Altered sleeping 3  Tired, decreased energy 2  Change in appetite 1  Feeling bad or failure about yourself  0  Trouble concentrating 0  Moving slowly or fidgety/restless 2  Suicidal thoughts 0  PHQ-9 Score 11  Difficult doing work/chores Not difficult at all   GAD 7 : Generalized Anxiety Score 08/19/2017  Nervous, Anxious, on Edge 3  Control/stop worrying 1  Worry too much - different things 0  Trouble relaxing 1  Restless 1  Easily annoyed or irritable 2  Afraid - awful might happen 1  Total GAD 7 Score 9    Lumbar pain and Arthritis: celebrex takes one a day with milk to help with pain and mobility- states has it in his neck; lower back and bilateral hands. Patient states was managed with opiates in the past has  had numerous back surgery's and stimulating unit implanted in back.   Abdominal Spams: takes bentyl pain is worse at night- states related to partial colectomy without colostomy from colon cancer.   Patient Active Problem List   Diagnosis Date Noted  . OP (osteoporosis) 04/19/2017  . Abdominal spasms 01/16/2016  . [place]ing [place] ([place]M) [place]  . Arthritis 05/18/2015  . Hyperglycemia 02/16/2015  . Anxiety disorder 10/04/2014  . Allergic rhinitis 10/04/2014  . Acid reflux 10/04/2014  . Insomnia 09/06/2014    Past Medical History:  Diagnosis Date  . Abnormal laboratory test    Sees Dr. PaMa Hillock. Acid reflux   . Allergy bee venom   mainly as a child  . Anxiety 12/08/1969  . Arthritis   . Asthma 12/08/1953   mainly as a child  . Chronic back pain   . [place] ([facility]   stage 3 s/p colon resection  . Depression   . History of chemotherapy    5-FU pump/leukovorin  . Insomnia   . [place]oma ([facility]   smoldering vs mgus  . Osteoporosis   . Seasonal allergies     Past Surgical History:  Procedure Laterality Date  . COLON SURGERY     resection, 2nd surgery for scar tissue removal  . COLONOSCOPY    .  COLONOSCOPY WITH PROPOFOL N/A 03/26/2016   Procedure: COLONOSCOPY WITH PROPOFOL;  Surgeon: Lollie Sails, MD;  Location: [facility] ENDOSCOPY;  Service: Endoscopy;  Laterality: N/A;  . ESOPHAGOGASTRODUODENOSCOPY  10/2010  . EYE SURGERY  1994 and 2010   [place]KA in 47 and [place] in 2010  . HERNIA REPAIR  1970   lower left  . LUMBAR FUSION     L4-L5, L5-S1  . SACROILIAC JOINT FUSION    . SHOULDER SURGERY Left   . SPINAL CORD STIMULATOR IMPLANT      Social History   Tobacco Use  . Smoking status: Former Smoker    Packs/day: 1.00    Years: 10.00    Pack years: 10.00    Types: Cigarettes    Last attempt to quit: 03/06/1970    Years since quitting: 47.4  . Smokeless tobacco: Never Used  . Tobacco comment: started at age 59 and quit at  age 16  Substance Use Topics  . Alcohol use: No    Frequency: Never    Comment: Stopped tuesday.      Current Outpatient Medications:  .  celecoxib (CELEBREX) 200 MG capsule, Take 1 capsule (200 mg total) by mouth daily., Disp: 90 capsule, Rfl: 1 .  Cholecalciferol (VITAMIN D) 2000 UNITS CAPS, Take 1 capsule by mouth daily., Disp: , Rfl:  .  CREATINE PO, Take by mouth., Disp: , Rfl:  .  dicyclomine (BENTYL) 10 MG capsule, Take 1 capsule (10 mg total) by mouth 2 (two) times daily., Disp: 180 capsule, Rfl: 0 .  esomeprazole (NEXIUM 24HR) 20 MG capsule, Take by mouth., Disp: , Rfl:  .  Multiple Vitamins-Minerals (CENTRUM SILVER ULTRA MENS) TABS, Take 1 tablet by mouth daily., Disp: , Rfl:  .  omeprazole (PRILOSEC) 40 MG capsule, Take 1 capsule (40 mg total) by mouth daily., Disp: 90 capsule, Rfl: 1 .  traZODone (DESYREL) 100 MG tablet, Take 1 tablet (100 mg total) by mouth at bedtime as needed. for sleep, Disp: 90 tablet, Rfl: 0  Allergies  Allergen Reactions  . Bee Venom Swelling    Bees Bees    ROS  No other specific complaints in a complete review of systems (except as listed in HPI above).  Objective  Vitals:   08/19/17 0958  BP: 140/80  Pulse: 79  Resp: 16  Temp: 98.3 F (36.8 C)  TempSrc: Oral  SpO2: 98%  Weight: 132 lb 6.4 oz (60.1 kg)  Height: 5' 7"  (1.702 m)    Body mass index is 20.74 kg/m.  Nursing Note and Vital Signs reviewed.  Physical Exam   Constitutional: Patient appears well-developed and well-nourished.  No distress.  Cardiovascular: Normal rate, regular rhythm, S1/S2 present.  No murmur or rub heard.  Pulmonary/Chest: Effort normal and breath sounds clear. No respiratory distress or retractions. Skin: warm and dry MSK: full ROM, steady gait.  Psychiatric: Patient has a normal mood and affect. behavior is normal. Judgment and thought content normal.  No results found for this or any previous visit (from the past 72 hour(s)).  Assessment &  Plan  1. Primary insomnia -discussed sleep hygiene - traZODone (DESYREL) 100 MG tablet; Take 1 tablet (100 mg total) by mouth at bedtime as needed. for sleep  Dispense: 90 tablet; Refill: 1  2. Arthritis - information given on the risks of NSAIDs, recommended taking with food and hopefully Cymbalta can help with pain so we can reduce NSAID dosage. - celecoxib (CELEBREX) 200 MG capsule; Take 1 capsule (200 mg total)  by mouth daily.  Dispense: 90 capsule; Refill: 1  3. Generalized anxiety disorder - DULoxetine (CYMBALTA) 20 MG capsule; Take 1 capsule (20 mg total) by mouth daily.  Dispense: 30 capsule; Refill: 2  4. Major depressive disorder in partial remission, unspecified whether recurrent (HCC) - DULoxetine (CYMBALTA) 20 MG capsule; Take 1 capsule (20 mg total) by mouth daily.  Dispense: 30 capsule; Refill: 2  5. History of colon cancer - UTD colonscopy 03/26/2016   Follow up in 10 days for BP recheck; follow up in one month for MDD and anxiety and see about reducing NSAID amount.   -Red flags and when to present for emergency care or RTC including fever >101.61F, chest pain, shortness of breath, new/worsening/un-resolving symptoms, reviewed with patient at time of visit. Follow up and care instructions discussed and provided in AVS. ----------------------------------------- I have reviewed this encounter including the documentation in this note and/or discussed this patient with the provider, Suezanne Cheshire DNP AGNP-C. I am certifying that I agree with the content of this note as supervising physician. Enid Derry, [facility] 08/19/2017, 5:38 PM

## 2017-08-19 NOTE — Patient Instructions (Addendum)
- Takes the cymbalta every morning with breakfast and let us know if you have any problems - Recommend taking an antacid OTC 30 min before eating spicy foods.   Sleep Hygiene Tips 1) Get regular. One of the best ways to train your body to sleep well is to go to bed and get up at more or less the same time every day, even on weekends and days off! This regular rhythm will make you feel better and will give your body something to work from. 2) Sleep when sleepy. Only try to sleep when you actually feel tired or sleepy, rather than spending too much time awake in bed. 3) Get up & try again. If you haven't been able to get to sleep after about 20 minutes or more, get up and do something calming or boring until you feel sleepy, then return to bed and try again. Sit quietly on the couch with the lights off (bright light will tell your brain that it is time to wake up), or read something boring like the phone book. Avoid doing anything that is too stimulating or interesting, as this will wake you up even more. 4) Avoid caffeine & nicotine. It is best to avoid consuming any caffeine (in coffee, tea, cola drinks, chocolate, and some medications) or nicotine (cigarettes) for at least 4-6 hours before going to bed. These substances act as stimulants and interfere with the ability to fall asleep 5) Avoid alcohol. It is also best to avoid alcohol for at least 4-6 hours before going to bed. Many people believe that alcohol is relaxing and helps them to get to sleep at first, but it actually interrupts the quality of sleep. 6) Bed is for sleeping. Try not to use your bed for anything other than sleeping and sex, so that your body comes to associate bed with sleep. If you use bed as a place to watch TV, eat, read, work on your laptop, pay bills, and other things, your body will not learn this Connection. 7) No naps. It is best to avoid taking naps during the day, to make sure that you are  tired at bedtime. If you can't make it through the day without a nap, make sure it is for less than an hour and before 3pm. 8) Sleep rituals. You can develop your own rituals of things to remind your body that it is time to sleep - some people find it useful to do relaxing stretches or breathing exercises for 15 minutes before bed each night, or sit calmly with a cup of caffeine-free tea. 9) Bathtime. Having a hot bath 1-2 hours before bedtime can be useful, as it will raise your body temperature, causing you to feel sleepy as your body temperature drops again. Research shows that sleepiness is associated with a drop in body temperature. 10) No clock-watching. Many people who struggle with sleep tend to watch the clock too much. Frequently checking the clock during the night can wake you up (especially if you turn on the light to read the time) and reinforces negative thoughts such as "Oh no, look how late it is, I'll never get to sleep" or "it's so early, I have only slept for 5 hours, this is terrible." 11) Use a sleep diary. This worksheet can be a useful way of making sure you have the right facts about your sleep, rather than making assumptions. Because a diary involves watching the clock (see point 10) it is a good idea to only use  it for two weeks to get an idea of what is going and then perhaps two months down the track to see how you are progressing. 12) Exercise. Regular exercise is a good idea to help with good sleep, but try not to do strenuous exercise in the 4 hours before bedtime. Morning walks are a great way to start the day feeling refreshed! 13) Eat right. A healthy, balanced diet will help you to sleep well, but timing is important. Some people find that a very empty stomach at bedtime is distracting, so it can be useful to have a light snack, but a heavy meal soon before bed can also interrupt sleep. Some people recommend a warm glass of milk, which contains  tryptophan, which acts as a natural sleep inducer. 14) The right space. It is very important that your bed and bedroom are quiet and comfortable for sleeping. A cooler room with enough blankets to stay warm is best, and make sure you have curtains or an eyemask to block out early morning light and earplugs if there is noise outside your room. 15) Keep daytime routine the same. Even if you have a bad night sleep and are tired it is important that you try to keep your daytime activities the same as you had planned. That is, don't avoid activities because you feel tired. This can reinforce the insomnia.   Please tell me if you recently had a heart attack, or if you or anyone in your family has or has ever had heart disease, a heart attack, or a stroke, if you smoke, and if you have or have ever had high cholesterol, high blood pressure, or diabetes AND have or have ever hadulcers or bleeding in your stomach or intestines or other bleeding disorders.  People who take nonsteroidal anti-inflammatory drugs (NSAIDs) (other than aspirin) such as Ibuprofen (Advil, Motrin), Naproxen (Aleve), celecoxib, diclofenac ect. may have a higher risk of having a heart attack or a stroke than people who do not take these medications. This risk may be higher for people who take NSAIDs for a long time. You are at higher risk if you have recently had a heart attack, or if you or anyone in your family has or has ever had heart disease, a heart attack, or a stroke, if you smoke, and if you have or have ever had high cholesterol, high blood pressure, or diabetes. Get emergency medical help right away if you experience any of the following symptoms: chest pain, shortness of breath, weakness in one part or side of the body, or slurred speech. If you experience any of the following symptoms, stop taking your NSAID and call us: severe stomach pain, heartburn, vomiting a substance that is bloody or looks like coffee grounds, blood  in the stool, or black and tarry stools.   Food Choices for Gastroesophageal Reflux Disease, Adult When you have gastroesophageal reflux disease (GERD), the foods you eat and your eating habits are very important. Choosing the right foods can help ease your discomfort. What guidelines do I need to follow?  Choose fruits, vegetables, whole grains, and low-fat dairy products.  Choose low-fat meat, fish, and poultry.  Limit fats such as oils, salad dressings, butter, nuts, and avocado.  Keep a food diary. This helps you identify foods that cause symptoms.  Avoid foods that cause symptoms. These may be different for everyone.  Eat small meals often instead of 3 large meals a day.  Eat your meals slowly, in a place where  you are relaxed.  Limit fried foods.  Cook foods using methods other than frying.  Avoid drinking alcohol.  Avoid drinking large amounts of liquids with your meals.  Avoid bending over or lying down until 2-3 hours after eating. What foods are not recommended? These are some foods and drinks that may make your symptoms worse: Vegetables Tomatoes. Tomato juice. Tomato and spaghetti sauce. Chili peppers. Onion and garlic. Horseradish. Fruits Oranges, grapefruit, and lemon (fruit and juice). Meats High-fat meats, fish, and poultry. This includes hot dogs, ribs, ham, sausage, salami, and bacon. Dairy Whole milk and chocolate milk. Sour cream. Cream. Butter. Ice cream. Cream cheese. Drinks Coffee and tea. Bubbly (carbonated) drinks or energy drinks. Condiments Hot sauce. Barbecue sauce. Sweets/Desserts Chocolate and cocoa. Donuts. Peppermint and spearmint. Fats and Oils High-fat foods. This includes [place] fries and potato chips. Other Vinegar. Strong spices. This includes black pepper, white pepper, red pepper, cayenne, curry powder, cloves, ginger, and chili powder. The items listed above may not be a complete list of foods and drinks to avoid. Contact your  dietitian for more information. This information is not intended to replace advice given to you by your health care provider. Make sure you discuss any questions you have with your health care provider. Document Released: 07/24/2011 Document Revised: 06/30/2015 Document Reviewed: 11/26/2012 Elsevier Interactive Patient Education  2017 [employer].

## 2017-08-23 ENCOUNTER — Ambulatory Visit
Admission: RE | Admit: 2017-08-23 | Discharge: 2017-08-23 | Disposition: A | Discharge status: Home / Self Care | Payer: Medicare Other | Source: Ambulatory Visit | Attending: Family Medicine | Admitting: Family Medicine

## 2017-08-23 ENCOUNTER — Other Ambulatory Visit: Payer: Self-pay

## 2017-08-23 ENCOUNTER — Encounter: Payer: Self-pay | Admitting: Family Medicine

## 2017-08-23 ENCOUNTER — Emergency Department
Admission: EM | Admit: 2017-08-23 | Discharge: 2017-08-23 | Disposition: A | Discharge status: Home / Self Care | Payer: Medicare Other | Attending: Emergency Medicine | Admitting: Emergency Medicine

## 2017-08-23 ENCOUNTER — Encounter: Payer: Self-pay | Admitting: Emergency Medicine

## 2017-08-23 ENCOUNTER — Ambulatory Visit (INDEPENDENT_AMBULATORY_CARE_PROVIDER_SITE_OTHER): Payer: Medicare Other | Admitting: Family Medicine

## 2017-08-23 ENCOUNTER — Emergency Department: Payer: Medicare Other

## 2017-08-23 VITALS — BP 124/80 | HR 82 | Temp 98.4°F | Resp 16 | Ht 67.0 in | Wt 131.6 lb

## 2017-08-23 DIAGNOSIS — Z87891 Personal history of nicotine dependence: Secondary | ICD-10-CM | POA: Diagnosis not present

## 2017-08-23 DIAGNOSIS — Z87442 Personal history of urinary calculi: Secondary | ICD-10-CM | POA: Diagnosis not present

## 2017-08-23 DIAGNOSIS — N200 Calculus of kidney: Secondary | ICD-10-CM

## 2017-08-23 DIAGNOSIS — Z85038 Personal history of other malignant neoplasm of large intestine: Secondary | ICD-10-CM | POA: Insufficient documentation

## 2017-08-23 DIAGNOSIS — R935 Abnormal findings on diagnostic imaging of other abdominal regions, including retroperitoneum: Secondary | ICD-10-CM | POA: Insufficient documentation

## 2017-08-23 DIAGNOSIS — R1030 Lower abdominal pain, unspecified: Secondary | ICD-10-CM | POA: Diagnosis not present

## 2017-08-23 DIAGNOSIS — R350 Frequency of micturition: Secondary | ICD-10-CM

## 2017-08-23 DIAGNOSIS — J45909 Unspecified asthma, uncomplicated: Secondary | ICD-10-CM | POA: Diagnosis not present

## 2017-08-23 DIAGNOSIS — K5900 Constipation, unspecified: Secondary | ICD-10-CM | POA: Insufficient documentation

## 2017-08-23 DIAGNOSIS — N132 Hydronephrosis with renal and ureteral calculous obstruction: Secondary | ICD-10-CM | POA: Diagnosis not present

## 2017-08-23 LAB — CBC
HCT: 40.3 % (ref 40.0–52.0)
Hemoglobin: 14.4 g/dL (ref 13.0–18.0)
MCH: 34.1 pg — ABNORMAL HIGH (ref 26.0–34.0)
MCHC: 35.6 g/dL (ref 32.0–36.0)
MCV: 95.9 fL (ref 80.0–100.0)
PLATELETS: 381 10*3/uL (ref 150–440)
RBC: 4.21 MIL/uL — AB (ref 4.40–5.90)
RDW: 12.5 % (ref 11.5–14.5)
WBC: 7.3 10*3/uL (ref 3.8–10.6)

## 2017-08-23 LAB — POCT URINALYSIS DIPSTICK
Bilirubin, UA: NEGATIVE
Glucose, UA: NEGATIVE
LEUKOCYTES UA: NEGATIVE
NITRITE UA: NEGATIVE
PROTEIN UA: POSITIVE — AB
Spec Grav, UA: >=1.03 — AB (ref 1.010–1.025)
Urobilinogen, UA: 0.2 E.U./dL
pH, UA: 5 (ref 5.0–8.0)

## 2017-08-23 LAB — BASIC METABOLIC PANEL
Anion gap: 10 (ref 5–15)
BUN: 28 mg/dL — ABNORMAL HIGH (ref 8–23)
CHLORIDE: 104 mmol/L (ref 98–111)
CO2: 25 mmol/L (ref 22–32)
CREATININE: 1.32 mg/dL — AB (ref 0.61–1.24)
Calcium: 9.8 mg/dL (ref 8.9–10.3)
GFR, EST NON AFRICAN AMERICAN: 54 mL/min — AB (ref >60)
Glucose, Bld: 118 mg/dL — ABNORMAL HIGH (ref 70–99)
Potassium: 3.7 mmol/L (ref 3.5–5.1)
SODIUM: 139 mmol/L (ref 135–145)

## 2017-08-23 LAB — URINALYSIS, COMPLETE (UACMP) WITH MICROSCOPIC
BILIRUBIN URINE: NEGATIVE
Bacteria, UA: NONE SEEN
GLUCOSE, UA: NEGATIVE mg/dL
HGB URINE DIPSTICK: NEGATIVE
KETONES UR: NEGATIVE mg/dL
NITRITE: NEGATIVE
Protein, ur: 30 mg/dL — AB
SPECIFIC GRAVITY, URINE: 1.024 (ref 1.005–1.030)
SQUAMOUS EPITHELIAL / LPF: NONE SEEN (ref 0–5)
pH: 5 (ref 5.0–8.0)

## 2017-08-23 MED ORDER — ONDANSETRON 4 MG PO TBDP: 4.0000 mg | ORAL_TABLET | Freq: Three times a day (TID) | ORAL | 0 refills | Status: DC | PRN

## 2017-08-23 MED ORDER — OXYCODONE-ACETAMINOPHEN 7.5-325 MG PO TABS: 1.0000 | ORAL_TABLET | ORAL | 0 refills | Status: DC | PRN

## 2017-08-23 MED ORDER — KETOROLAC TROMETHAMINE 30 MG/ML IJ SOLN
30.0000 mg | Freq: Once | INTRAMUSCULAR | Status: AC
  Administered 2017-08-23: 30 mg via INTRAVENOUS
  Filled 2017-08-23: qty 1

## 2017-08-23 MED ORDER — POLYETHYLENE GLYCOL 3350 17 GM/SCOOP PO POWD: 1.0000 | Freq: Once | ORAL | 0 refills | Status: DC

## 2017-08-23 MED ORDER — HYDROMORPHONE HCL 1 MG/ML IJ SOLN
0.5000 mg | Freq: Once | INTRAMUSCULAR | Status: AC
  Administered 2017-08-23: 0.5 mg via INTRAVENOUS
  Filled 2017-08-23: qty 1

## 2017-08-23 MED ORDER — POLYETHYLENE GLYCOL 3350 17 GM/SCOOP PO POWD: 1.0000 | Freq: Once | ORAL | 0 refills | Status: AC

## 2017-08-23 NOTE — Progress Notes (Signed)
Name: [name]   MRN: [id number]    DOB: 28-Nov-1950   Date:08/23/2017       Progress Note  Subjective  Chief Complaint  Chief Complaint  Patient presents with  . Flank Pain    has passed 2 kidney stones and fill there is one left. Would like xray  . Abdominal Pain    HPI  Pt presents with concern for kidney stones.  He has history of lithotripsy about 20 years ago; he has passed many kidney stones in the past, but has had to have surgical removal of a larger stone as well. He brings 2 stones with him today - passed one 3 weeks ago, another about 15 days ago, and symptoms have progressively worsened.  LEFT lower abdomen is painful.  He denies abnormal back pain (has chronic back pain).  He is concerned that he has a stone that is blocking his ureter because his pain has been constant.   Patient Active Problem List   Diagnosis Date Noted  . History of colon cancer 08/19/2017  . OP (osteoporosis) 04/19/2017  . Abdominal spasms 01/16/2016  . Smoldering multiple myeloma (SMM) ([place]) 01/09/2016  . Arthritis 05/18/2015  . Hyperglycemia 02/16/2015  . Anxiety disorder 10/04/2014  . Allergic rhinitis 10/04/2014  . Acid reflux 10/04/2014  . Insomnia 09/06/2014    Social History   Tobacco Use  . Smoking status: Former Smoker    Packs/day: 1.00    Years: 10.00    Pack years: 10.00    Types: Cigarettes    Last attempt to quit: 03/06/1970    Years since quitting: 47.4  . Smokeless tobacco: Never Used  . Tobacco comment: started at age 78 and quit at age 18  Substance Use Topics  . Alcohol use: No    Frequency: Never    Comment: Stopped tuesday.      Current Outpatient Medications:  .  celecoxib (CELEBREX) 200 MG capsule, Take 1 capsule (200 mg total) by mouth daily., Disp: 90 capsule, Rfl: 1 .  Cholecalciferol (VITAMIN D) 2000 UNITS CAPS, Take 1 capsule by mouth daily., Disp: , Rfl:  .  CREATINE PO, Take by mouth., Disp: , Rfl:  .  dicyclomine (BENTYL) 10 MG capsule, Take  1 capsule (10 mg total) by mouth 2 (two) times daily., Disp: 180 capsule, Rfl: 0 .  esomeprazole (NEXIUM 24HR) 20 MG capsule, Take by mouth., Disp: , Rfl:  .  Multiple Vitamins-Minerals (CENTRUM SILVER ULTRA MENS) TABS, Take 1 tablet by mouth daily., Disp: , Rfl:  .  omeprazole (PRILOSEC) 40 MG capsule, Take 1 capsule (40 mg total) by mouth daily., Disp: 90 capsule, Rfl: 1 .  traZODone (DESYREL) 100 MG tablet, Take 1 tablet (100 mg total) by mouth at bedtime as needed. for sleep, Disp: 90 tablet, Rfl: 1 .  DULoxetine (CYMBALTA) 20 MG capsule, Take 1 capsule (20 mg total) by mouth daily. (Patient not taking: Reported on 08/23/2017), Disp: 30 capsule, Rfl: 2  Allergies  Allergen Reactions  . Bee Venom Swelling    Bees Bees   Review of Systems  Constitutional: Negative for chills, fever and malaise/fatigue.  Respiratory: Negative for shortness of breath.   Cardiovascular: Negative for chest pain.  Gastrointestinal: Positive for abdominal pain and nausea. Negative for vomiting.  Genitourinary: Positive for dysuria (occasional), flank pain, frequency and urgency. Negative for hematuria.  Neurological: Positive for headaches.  Psychiatric/Behavioral: Negative.    Objective  Vitals:   08/23/17 1446  BP: 124/80  Pulse:  82  Resp: 16  Temp: 98.4 F (36.9 C)  TempSrc: Oral  SpO2: 95%  Weight: 131 lb 9.6 oz (59.7 kg)  Height: 5' 7" (1.702 m)   Body mass index is 20.61 kg/m.  Nursing Note and Vital Signs reviewed.  Physical Exam  Constitutional: He is oriented to person, place, and time. He appears well-developed and well-nourished.  HENT:  Head: Normocephalic and atraumatic.  Right Ear: Tympanic membrane, external ear and ear canal normal.  Left Ear: Tympanic membrane, external ear and ear canal normal.  Nose: Nose normal.  Mouth/Throat: Uvula is midline, oropharynx is clear and moist and mucous membranes are normal. No oropharyngeal exudate. No tonsillar exudate.  Eyes: Pupils  are equal, round, and reactive to light. Conjunctivae and EOM are normal. No scleral icterus.  Neck: Normal range of motion. Neck supple.  Cardiovascular: Normal rate, regular rhythm and normal heart sounds.  Pulmonary/Chest: Effort normal and breath sounds normal. No respiratory distress.  Abdominal: Soft. Bowel sounds are normal. He exhibits no mass. There is no tenderness. There is no rebound.  Musculoskeletal: Normal range of motion. He exhibits no edema.  Lymphadenopathy:    He has no cervical adenopathy.  Neurological: He is alert and oriented to person, place, and time. No cranial nerve deficit.  Skin: Skin is warm and dry. No rash noted. No erythema.  Psychiatric: He has a normal mood and affect. His behavior is normal. Judgment and thought content normal.  Nursing note and vitals reviewed.   Results for orders placed or performed in visit on 08/23/17 (from the past 72 hour(s))  POCT urinalysis dipstick     Status: Abnormal   Collection Time: 08/23/17  3:11 PM  Result Value Ref Range   Color, UA gold    Clarity, UA clear    Glucose, UA Negative Negative   Bilirubin, UA negative    Ketones, UA small    Spec Grav, UA >=1.030 (A) 1.010 - 1.025   Blood, UA large    pH, UA 5.0 5.0 - 8.0   Protein, UA Positive (A) Negative   Urobilinogen, UA 0.2 0.2 or 1.0 E.U./dL   Nitrite, UA negative    Leukocytes, UA Negative Negative   Appearance clear    Odor none     Assessment & Plan  1. Urinary frequency - POCT urinalysis dipstick - does show blood, no indication of infection. - DG Abd 1 View; Future - we will look for hydronephrosis - if negative for hydronephrosis will send STAT urology referral for appointment on Monday; if positive, will send to ER. - Urine Culture - Urinalysis, microscopic only  2. History of nephrolithiasis - POCT urinalysis dipstick - DG Abd 1 View; Future - Urinalysis, microscopic only  -Red flags and when to present for emergency care or RTC  including fever >101.69F, chest pain, shortness of breath, new/worsening/un-resolving symptoms, reviewed with patient at time of visit. Follow up and care instructions discussed and provided in AVS.  ------------- Addendum:  KUB shows 22m and 3.6 x 2.874mcalcifications in the left mid-abdomen.  Patient is experiencing a moderate to severe pain along with hematuria. Recommend he present for emergency care for further evaluation of nephrolithiasis. [facility]

## 2017-08-23 NOTE — ED Provider Notes (Signed)
[facility] Emergency Department Provider Note       Time seen: ----------------------------------------- 6:33 PM on 08/23/2017 -----------------------------------------   I have reviewed the triage vital signs and the nursing notes.  HISTORY   Chief Complaint Flank Pain and Abdominal Pain    HPI [name] is a 67 y.o. male with a history of chronic back pain, arthritis, GERD, multiple myeloma, renal colic who presents to the ED for lower abdominal pain.  Patient does have history of kidney stones and thinks may be he currently has a kidney stone.  He reports recently he passed 2 kidney stones and is had some blood in his urine.  He had an outpatient x-ray today that showed 7 mm stones and hydronephrosis.  Patient was sent here for further evaluation concerning same.  His main complaint is left-sided abdominal pain.  Past Medical History:  Diagnosis Date  . Abnormal laboratory test    Sees Dr. Ma Hillock  . Acid reflux   . Allergy bee venom   mainly as a child  . Anxiety 12/08/1969  . Arthritis   . Asthma 12/08/1953   mainly as a child  . Chronic back pain   . Colon cancer ([place]) 11/1999   stage 3 s/p colon resection  . Depression   . History of chemotherapy    5-FU pump/leukovorin  . Insomnia   . Multiple myeloma ([place]) 12/2011   smoldering vs mgus  . Osteoporosis   . Seasonal allergies     Patient Active Problem List   Diagnosis Date Noted  . History of colon cancer 08/19/2017  . OP (osteoporosis) 04/19/2017  . Abdominal spasms 01/16/2016  . Smoldering multiple myeloma (SMM) ([facility]) 01/09/2016  . Arthritis 05/18/2015  . Hyperglycemia 02/16/2015  . Anxiety disorder 10/04/2014  . Allergic rhinitis 10/04/2014  . Acid reflux 10/04/2014  . Insomnia 09/06/2014    Past Surgical History:  Procedure Laterality Date  . COLON SURGERY     resection, 2nd surgery for scar tissue removal  . COLONOSCOPY    . COLONOSCOPY WITH PROPOFOL N/A 03/26/2016    Procedure: COLONOSCOPY WITH PROPOFOL;  Surgeon: Lollie Sails, MD;  Location: [facility] ENDOSCOPY;  Service: Endoscopy;  Laterality: N/A;  . ESOPHAGOGASTRODUODENOSCOPY  10/2010  . EYE SURGERY  1994 and 2010   RKA in 42 and Lazik in 2010  . HERNIA REPAIR  1970   lower left  . LUMBAR FUSION     L4-L5, L5-S1  . SACROILIAC JOINT FUSION    . SHOULDER SURGERY Left   . SPINAL CORD STIMULATOR IMPLANT      Allergies Bee venom  Social History Social History   Tobacco Use  . Smoking status: Former Smoker    Packs/day: 1.00    Years: 10.00    Pack years: 10.00    Types: Cigarettes    Last attempt to quit: 03/06/1970    Years since quitting: 47.4  . Smokeless tobacco: Never Used  . Tobacco comment: started at age 31 and quit at age 53  Substance Use Topics  . Alcohol use: No    Frequency: Never    Comment: Stopped tuesday.   . Drug use: No   Review of Systems Constitutional: Negative for fever. Cardiovascular: Negative for chest pain. Respiratory: Negative for shortness of breath. Gastrointestinal: Positive for abdominal pain Genitourinary: Negative for dysuria. Musculoskeletal: Negative for back pain. Skin: Negative for rash. Neurological: Negative for headaches, focal weakness or numbness.  All systems negative/normal/unremarkable except as stated in the  HPI  ____________________________________________   PHYSICAL EXAM:  VITAL SIGNS: ED Triage Vitals  Enc Vitals Group     BP 08/23/17 1640 (!) 189/100     Pulse Rate 08/23/17 1640 78     Resp 08/23/17 1640 18     Temp 08/23/17 1640 98.8 F (37.1 C)     Temp Source 08/23/17 1640 Oral     SpO2 08/23/17 1640 96 %     Weight 08/23/17 1639 131 lb (59.4 kg)     Height 08/23/17 1639 _0  (1.702 m)     Head Circumference --      Peak Flow --      Pain Score 08/23/17 1639 8     Pain Loc --      Pain Edu? --      Excl. in [place]? --    Constitutional: Alert and oriented. Well appearing and in no distress. Eyes:  Conjunctivae are normal. Normal extraocular movements. ENT   Head: Normocephalic and atraumatic.   Nose: No congestion/rhinnorhea.   Mouth/Throat: Mucous membranes are moist.   Neck: No stridor. Cardiovascular: Normal rate, regular rhythm. No murmurs, rubs, or gallops. Respiratory: Normal respiratory effort without tachypnea nor retractions. Breath sounds are clear and equal bilaterally. No wheezes/rales/rhonchi. Gastrointestinal: Mild left lower quadrant tenderness, no rebound or guarding.  Normal bowel sounds. Musculoskeletal: Nontender with normal range of motion in extremities. No lower extremity tenderness nor edema. Neurologic:  Normal speech and language. No gross focal neurologic deficits are appreciated.  Skin:  Skin is warm, dry and intact. No rash noted. Psychiatric: Mood and affect are normal. Speech and behavior are normal.  ____________________________________________  ED COURSE:  As part of my medical decision making, I reviewed the following data within the [place] History obtained from family if available, nursing notes, old chart and ekg, as well as notes from prior ED visits. Patient presented for abdominal pain with possible renal colic, we will assess with labs and imaging as indicated at this time.   Procedures ____________________________________________   LABS (pertinent positives/negatives)  Labs Reviewed  BASIC METABOLIC PANEL - Abnormal; Notable for the following components:      Result Value   Glucose, Bld 118 (*)    BUN 28 (*)    Creatinine, Ser 1.32 (*)    GFR calc non Af Amer 54 (*)    All other components within normal limits  CBC - Abnormal; Notable for the following components:   RBC 4.21 (*)    MCH 34.1 (*)    All other components within normal limits  URINALYSIS, COMPLETE (UACMP) WITH MICROSCOPIC    RADIOLOGY Images were viewed by me  CT renal protocol IMPRESSION: 1. 7 mm proximal RIGHT ureteral calculus  resulting in mild hydronephrosis. 2. Bilateral nephrolithiasis measuring to 7 mm on the LEFT. 3. Moderate amount of retained large bowel stool. No bowel obstruction. 4. L4-5 pseudoarthrosis. ____________________________________________  DIFFERENTIAL DIAGNOSIS   Renal colic, UTI, pyelonephritis, chronic pain, adhesions  FINAL ASSESSMENT AND PLAN  Flank pain, renal colic, constipation   Plan: The patient had presented for flank pain. Patient's labs not reveal any acute process, he has known hematuria. Patient's imaging revealed a large right ureteral calculus and constipation with no other focal abnormalities.  Will be placed on Flomax, pain medicine, Zofran and referred for urology outpatient follow-up   Laurence Aly, MD   Note: This note was generated in part or whole with voice recognition software. Voice recognition is usually quite accurate  but there are transcription errors that can and very often do occur. I apologize for any typographical errors that were not detected and corrected.     Earleen Newport, MD 08/23/17 2046

## 2017-08-23 NOTE — ED Triage Notes (Addendum)
Pt c/o lower abdominal pain, pt has hx of kidney stones, thinks maybe had back pain.   Recently passed 2 kidney stones, c/o blood in urine.   Pt had XR today that reported 14mm stones and hydronephrosis. Seen by PCP and had UA done already which can be seen under results in Epic.

## 2017-08-24 LAB — URINALYSIS, MICROSCOPIC ONLY
BACTERIA UA: NONE SEEN /HPF
HYALINE CAST: NONE SEEN /LPF
Squamous Epithelial / LPF: NONE SEEN /HPF (ref <=5)
WBC, UA: NONE SEEN /HPF (ref 0–5)

## 2017-08-24 LAB — URINE CULTURE
MICRO NUMBER:: 90859700
Result:: NO GROWTH
SPECIMEN QUALITY: ADEQUATE

## 2017-08-26 ENCOUNTER — Telehealth: Payer: Self-pay | Admitting: Urology

## 2017-08-26 ENCOUNTER — Other Ambulatory Visit: Payer: Self-pay | Admitting: Family Medicine

## 2017-08-26 ENCOUNTER — Telehealth: Payer: Self-pay | Admitting: Family Medicine

## 2017-08-26 DIAGNOSIS — N200 Calculus of kidney: Secondary | ICD-10-CM

## 2017-08-26 MED ORDER — TAMSULOSIN HCL 0.4 MG PO CAPS: 0.4000 mg | ORAL_CAPSULE | Freq: Every day | ORAL | 0 refills | Status: DC

## 2017-08-26 NOTE — Telephone Encounter (Signed)
Copied from [place] [id number]. Topic: General - Other >> Aug 26, 2017 11:07 AM Cecelia Byars, NT wrote: Reason for CRM: Patient would like a return call from Raelyn Ensign in regards to his ed visit and some other issues as well please call he says he is in a lot pain still , he says it is really difficult to sit up to message her,  please call [phone]

## 2017-08-26 NOTE — Progress Notes (Signed)
Spoke with patient on the phone, he never received flomax Rx, I will send today.  He used fleet enema and this was effective x1 BM, but is now feeling constipation even after taking Miralax. Advised he may try colace per package instructions in addition to miralax for the time being.    Please call patient back: - Flomax is Sent - He will call back if not able to see urology int he next few days and we will send new referral - He requested I review the CT scan results because he was concerned that there was mention of a mass in his intestines - please reassure him that there is evidence of his prior surgery, a moderate amount of stool in his large bowel (indicating the constipation we discussed), but no mention of a mass in his results.  No recommendations for follow up imaging at this time.

## 2017-08-27 ENCOUNTER — Encounter: Payer: Self-pay | Admitting: Urology

## 2017-08-27 ENCOUNTER — Ambulatory Visit (INDEPENDENT_AMBULATORY_CARE_PROVIDER_SITE_OTHER): Payer: Medicare Other | Admitting: Urology

## 2017-08-27 ENCOUNTER — Other Ambulatory Visit: Payer: Self-pay | Admitting: Radiology

## 2017-08-27 ENCOUNTER — Telehealth: Payer: Self-pay | Admitting: Radiology

## 2017-08-27 VITALS — BP 149/79 | HR 58 | Ht 67.0 in | Wt 135.8 lb

## 2017-08-27 DIAGNOSIS — N201 Calculus of ureter: Secondary | ICD-10-CM

## 2017-08-27 DIAGNOSIS — N200 Calculus of kidney: Secondary | ICD-10-CM

## 2017-08-27 DIAGNOSIS — N23 Unspecified renal colic: Secondary | ICD-10-CM | POA: Diagnosis not present

## 2017-08-27 LAB — MICROSCOPIC EXAMINATION
BACTERIA UA: NONE SEEN
EPITHELIAL CELLS (NON RENAL): NONE SEEN /HPF (ref 0–10)
WBC UA: NONE SEEN /HPF (ref 0–5)

## 2017-08-27 LAB — URINALYSIS, COMPLETE
Bilirubin, UA: NEGATIVE
Glucose, UA: NEGATIVE
KETONES UA: NEGATIVE
Leukocytes, UA: NEGATIVE
NITRITE UA: NEGATIVE
RBC, UA: NEGATIVE
Specific Gravity, UA: >1.03 — ABNORMAL HIGH (ref 1.005–1.030)
Urobilinogen, Ur: 0.2 mg/dL (ref 0.2–1.0)
pH, UA: 5.5 (ref 5.0–7.5)

## 2017-08-27 NOTE — Telephone Encounter (Signed)
-----   Message from Abbie Sons, MD sent at 08/27/2017  7:26 AM EDT ----- Regarding: RE: appointment You can add at 1:15 ----- Message ----- From: Benard Halsted Sent: 08/26/2017   8:54 AM To: Abbie Sons, MD Subject: appointment                                    Patient was seen in the ED over the weekend and asked to be seen in the office this week. Can you look at his CT scan and your schedule for 08-27-17 and let me know if I can add him on? I have already double booked [facility] today with the call in's and now i'm checking with you. I told the patient I would let them know.   Thanks, Sharyn Lull

## 2017-08-27 NOTE — Progress Notes (Signed)
[name] needs to defer to Dr. Bernardo Heater for management going forward, including pain related to his kidney stones. If he is needing to discuss his constipation, please set up a follow up appointment for later this week.

## 2017-08-27 NOTE — H&P (View-Only) (Signed)
 08/27/2017 2:06 PM   [name] 09/30/1950 [id number]  Referring provider: Boyce, Emily E, FNP [address] [place], [place] [address]  Chief Complaint  Patient presents with  . Nephrolithiasis    HPI: [name] is a 66-year-old male who saw his PCP on 08/23/2017 complaining of right back pain and left lower quadrant abdominal pain.  He had passed 2 small stones 2 weeks prior to that visit.  A KUB was obtained which showed calcifications suspicious for ureteral stones and microhematuria on urinalysis.  It was recommended he go to the [facility] ED for further evaluation.  A noncontrast CT showed a 7 mm right proximal ureteral calculus with mild hydronephrosis and bilateral nonobstructing renal calculi.    He was discharged on oxycodone and tamsulosin.  His pain has been controlled with oxycodone.  He denies fever or chills.  He has mild nausea without vomiting and is taking Zofran orally.  He has a history of recurrent stone disease and underwent shockwave lithotripsy approximately 20 years ago.  He had a ureteroscopic stone removal while living in [place] in 2003.  PMH: Past Medical History:  Diagnosis Date  . Abnormal laboratory test    Sees Dr. Pandit  . Acid reflux   . Allergy bee venom   mainly as a child  . Anxiety 12/08/1969  . Arthritis   . Asthma 12/08/1953   mainly as a child  . Chronic back pain   . Colon cancer ([facility]) 11/1999   stage 3 s/p colon resection  . Depression   . History of chemotherapy    5-FU pump/leukovorin  . Insomnia   . Multiple myeloma ([facility]) 12/2011   smoldering vs mgus  . Osteoporosis   . Seasonal allergies     Surgical History: Past Surgical History:  Procedure Laterality Date  . COLON SURGERY     resection, 2nd surgery for scar tissue removal  . COLONOSCOPY    . COLONOSCOPY WITH PROPOFOL N/A 03/26/2016   Procedure: COLONOSCOPY WITH PROPOFOL;  Surgeon: Martin U Skulskie, MD;  Location: [facility] ENDOSCOPY;  Service: Endoscopy;   Laterality: N/A;  . ESOPHAGOGASTRODUODENOSCOPY  10/2010  . EYE SURGERY  1994 and 2010   RKA in 94 and Lazik in 2010  . HERNIA REPAIR  1970   lower left  . LUMBAR FUSION     L4-L5, L5-S1  . SACROILIAC JOINT FUSION    . SHOULDER SURGERY Left   . SPINAL CORD STIMULATOR IMPLANT      Home Medications:  Allergies as of 08/27/2017      Reactions   Bee Venom Swelling   Bees Bees      Medication List        Accurate as of 08/27/17  2:06 PM. Always use your most recent med list.          celecoxib 200 MG capsule Commonly known as:  CELEBREX Take 1 capsule (200 mg total) by mouth daily.   CENTRUM SILVER ULTRA MENS Tabs Take 1 tablet by mouth daily.   CREATINE PO Take by mouth.   dicyclomine 10 MG capsule Commonly known as:  BENTYL Take 1 capsule (10 mg total) by mouth 2 (two) times daily.   NEXIUM 24HR 20 MG capsule Generic drug:  esomeprazole Take by mouth.   ondansetron 4 MG disintegrating tablet Commonly known as:  ZOFRAN ODT Take 1 tablet (4 mg total) by mouth every 8 (eight) hours as needed for nausea or vomiting.   oxyCODONE-acetaminophen 7.5-325 MG tablet   Commonly known as:  PERCOCET Take 1 tablet by mouth every 4 (four) hours as needed for severe pain.   tamsulosin 0.4 MG Caps capsule Commonly known as:  FLOMAX Take 1 capsule (0.4 mg total) by mouth daily after supper.   traZODone 100 MG tablet Commonly known as:  DESYREL Take 1 tablet (100 mg total) by mouth at bedtime as needed. for sleep   Vitamin D 2000 units Caps Take 1 capsule by mouth daily.       Allergies:  Allergies  Allergen Reactions  . Bee Venom Swelling    Bees Bees    Family History: Family History  Problem Relation Age of Onset  . Lymphoma Mother   . Arthritis Mother        deceased  . Cancer Mother   . Lung cancer Father   . Alcohol abuse Father        deceased  . Cancer Father   . Heart disease Unknown        grandparents  . Hyperlipidemia Sister   . Diabetes  Brother   . Diabetes Son   . Alcohol abuse Brother   . Arthritis Brother   . Depression Brother   . Diabetes Brother   . Alcohol abuse Sister   . Arthritis Sister   . Depression Sister   . Alcohol abuse Son   . Early death Son        truck accident 77 at age 66    Social History:  reports that he quit smoking about 47 years ago. His smoking use included cigarettes. He has a 10.00 pack-year smoking history. He has never used smokeless tobacco. He reports that he does not drink alcohol or use drugs.  ROS: UROLOGY Frequent Urination?: Yes Hard to postpone urination?: Yes Burning/pain with urination?: Yes Get up at night to urinate?: Yes Leakage of urine?: Yes Urine stream starts and stops?: Yes Trouble starting stream?: Yes Do you have to strain to urinate?: Yes Blood in urine?: Yes Urinary tract infection?: No Sexually transmitted disease?: No Injury to kidneys or bladder?: No Painful intercourse?: No Weak stream?: Yes Erection problems?: No Penile pain?: No  Gastrointestinal Nausea?: Yes Vomiting?: No Indigestion/heartburn?: Yes Diarrhea?: Yes Constipation?: Yes  Constitutional Fever: No Night sweats?: Yes Weight loss?: No Fatigue?: Yes  Skin Skin rash/lesions?: No Itching?: Yes  Eyes Blurred vision?: No Double vision?: No  Ears/Nose/Throat Sore throat?: No Sinus problems?: No  Hematologic/Lymphatic Swollen glands?: No Easy bruising?: No  Cardiovascular Leg swelling?: No Chest pain?: No  Respiratory Cough?: Yes Shortness of breath?: No  Endocrine Excessive thirst?: Yes  Musculoskeletal Back pain?: Yes Joint pain?: Yes  Neurological Headaches?: Yes Dizziness?: No  Psychologic Depression?: Yes Anxiety?: Yes  Physical Exam: BP (!) 149/79 (BP Location: Left Arm, Patient Position: Sitting, Cuff Size: Normal)   Pulse (!) 58   Ht 5' 7" (1.702 m)   Wt 135 lb 12.8 oz (61.6 kg)   BMI 21.27 kg/m    Constitutional:  Alert and  oriented, No acute distress. HEENT: [place] AT, moist mucus membranes.  Trachea midline, no masses. Cardiovascular: No clubbing, cyanosis, or edema.  RRR Respiratory: Normal respiratory effort, no increased work of breathing.  Lungs clear GI: Abdomen is soft, mild lower quadrant tenderness bilaterally, nondistended, no abdominal masses GU: No CVA tenderness Lymph: No cervical or inguinal lymphadenopathy. Skin: No rashes, bruises or suspicious lesions. Neurologic: Grossly intact, no focal deficits, moving all 4 extremities. Psychiatric: Normal mood and affect.  Laboratory Data: Lab Results  Component Value Date   WBC 7.3 08/23/2017   HGB 14.4 08/23/2017   HCT 40.3 08/23/2017   MCV 95.9 08/23/2017   PLT 381 08/23/2017    Lab Results  Component Value Date   CREATININE 1.32 (H) 08/23/2017    Lab Results  Component Value Date   PSA 1.7 01/11/2017    Urinalysis    Component Value Date/Time   COLORURINE YELLOW (A) 08/23/2017 1843   APPEARANCEUR HAZY (A) 08/23/2017 1843   LABSPEC 1.024 08/23/2017 1843   PHURINE 5.0 08/23/2017 1843   GLUCOSEU NEGATIVE 08/23/2017 1843   HGBUR NEGATIVE 08/23/2017 1843   BILIRUBINUR NEGATIVE 08/23/2017 1843   BILIRUBINUR negative 08/23/2017 1511   KETONESUR NEGATIVE 08/23/2017 1843   PROTEINUR 30 (A) 08/23/2017 1843   UROBILINOGEN 0.2 08/23/2017 1511   NITRITE NEGATIVE 08/23/2017 1843   LEUKOCYTESUR TRACE (A) 08/23/2017 1843    Lab Results  Component Value Date   BACTERIA NONE SEEN 08/23/2017    Pertinent Imaging: Images personally reviewed  Results for orders placed in visit on 08/23/17  DG Abd 1 View   Narrative CLINICAL DATA:  Left lower abdominal pain and nausea. Previous colon resection for colon cancer.  EXAM: ABDOMEN - 1 VIEW  COMPARISON:  Pelvis and sacroiliac joint radiographs dated 08/13/2012.  FINDINGS: Normal bowel gas pattern. 3.6 x 2.8 cm oval area calcification in the left mid abdomen laterally. Anastomotic staples  in the left mid to lower abdomen. L4-5 and L5-S1 laminectomy defects and interbody effusion. Stable bilateral sacroiliac joint fixation hardware. Neural stimulator leads extending into the spinal canal in the lower lumbar spine and extending superiorly into the thoracic spine with their tips not included. 7 mm oval calcification in the right mid abdomen medially. Multiple small prostate calcifications.  IMPRESSION: 1. No acute abnormality. 2. 7 mm calcification in the right mid abdomen medially. This could represent a ureteral calculus, phlebolith or calcified lymph node. 3. 3.6 x 2.8 cm nonspecific calcification in the left mid abdomen laterally.   Electronically Signed   By: Steven  Reid M.D.   On: 08/23/2017 15:40     Results for orders placed during the hospital encounter of 08/23/17  CT Renal Stone Study   Narrative CLINICAL DATA:  Lower abdominal pain, back pain. Recently passed 2 kidney stones. Abdominal radiograph today demonstrated kidney stones.  EXAM: CT ABDOMEN AND PELVIS WITHOUT CONTRAST  TECHNIQUE: Multidetector CT imaging of the abdomen and pelvis was performed following the standard protocol without IV contrast.  COMPARISON:  Abdominal radiograph August 23, 2017  FINDINGS: LOWER CHEST: Lung bases are clear. The visualized heart size is normal. No pericardial effusion.  HEPATOBILIARY: Normal.  PANCREAS: Normal.  SPLEEN: Normal.  ADRENALS/URINARY TRACT: Kidneys are orthotopic, demonstrating normal size and morphology. 5 mm proximal RIGHT ureteral calculus. Mild RIGHT hydronephrosis. Approximately 7 RIGHT nephrolithiasis measuring to 4 mm. At least 3 LEFT nephrolithiasis measuring to 7 mm. Limited assessment for renal masses by nonenhanced CT. The unopacified ureters are normal in course and caliber. Urinary bladder is partially distended and unremarkable. Normal adrenal glands.  STOMACH/BOWEL: The stomach, small and large bowel are normal in course  and caliber without inflammatory changes, sensitivity decreased by lack of enteric contrast. Descending colonic surgical anastomosis. Moderate amount of retained large bowel stool. Normal appendix.  VASCULAR/LYMPHATIC: Aortoiliac vessels are normal in course and caliber. Trace calcific atherosclerosis. No lymphadenopathy by CT size criteria.  REPRODUCTIVE: Mild prostatomegaly.  OTHER: No intraperitoneal free fluid or free air. 2.7 cm calcified mass LEFT omentum   could reflect old injury or infarct.  MUSCULOSKELETAL: Non-acute. Small bilateral fat containing inguinal hernias. Bilateral sacroiliac struts. Spinal stimulator battery pack LEFT gluteal soft tissues, L1-2 interlaminar approach. Status post L4-5 interbody fusion with pseudoarthrosis. L5-S1 arthrodesis. L4-5 and L5-S1 laminectomies. Mild levoscoliosis on this nonweightbearing examination.  IMPRESSION: 1. 7 mm proximal RIGHT ureteral calculus resulting in mild hydronephrosis. 2. Bilateral nephrolithiasis measuring to 7 mm on the LEFT. 3. Moderate amount of retained large bowel stool. No bowel obstruction. 4. L4-5 pseudoarthrosis.   Electronically Signed   By: Courtnay  Bloomer M.D.   On: 08/23/2017 19:17     Assessment & Plan:   66-year-old male with an obstructing 7 mm right proximal ureteral calculus.  He states he cannot undergo shockwave lithotripsy due to a spinal cord stimulator.  I discussed ureteroscopic removal which he desires to schedule.  Will also attempt to clear his right renal calculi in addition to the proximal ureteral stone.  The indications and nature of the planned procedure were discussed as well as the potential  benefits and expected outcome.  Alternatives have been discussed in detail. The most common complications and side effects were discussed including but not limited to infection/sepsis; blood loss; damage to urethra, bladder, ureter, kidney; need for multiple surgeries; need for prolonged  stent placement as well as general anesthesia risks. Although uncommon he was also informed of the possibility that the calculus may not be able to be treated due to inability to obtain access to the upper ureter. In that event he would require stent placement and a follow-up procedure after a period of stent dilation. All of his questions were answered and he desires to proceed.  At the time of our visit he did not appear to be distracted or in pain.   Scott C Stoioff, MD   [facility] [address], [address] [place], [place] [address] [phone]  

## 2017-08-27 NOTE — Telephone Encounter (Signed)
App made patient notified  Sharyn Lull

## 2017-08-27 NOTE — Telephone Encounter (Signed)
Patient requests refill of percocet 7.5/325mg  to last until surgery scheduled 09/03/2017.

## 2017-08-27 NOTE — Progress Notes (Signed)
Was seen by Dr. Darlys Gales today. Patient stated he is not any better. Waiting on office to call back to schedule his surgery date

## 2017-08-27 NOTE — Progress Notes (Signed)
 08/27/2017 2:06 PM   Clell R Tapley 03/22/1950 [id number]  Referring provider: Boyce, Emily E, FNP [address] [place], [place] [address]  Chief Complaint  Patient presents with  . Nephrolithiasis    HPI: [name] is a 67-year-old male who saw his PCP on 08/23/2017 complaining of right back pain and left lower quadrant abdominal pain.  He had passed 2 small stones 2 weeks prior to that visit.  A KUB was obtained which showed calcifications suspicious for ureteral stones and microhematuria on urinalysis.  It was recommended he go to the [facility] ED for further evaluation.  A noncontrast CT showed a 7 mm right proximal ureteral calculus with mild hydronephrosis and bilateral nonobstructing renal calculi.    He was discharged on oxycodone and tamsulosin.  His pain has been controlled with oxycodone.  He denies fever or chills.  He has mild nausea without vomiting and is taking Zofran orally.  He has a history of recurrent stone disease and underwent shockwave lithotripsy approximately 20 years ago.  He had a ureteroscopic stone removal while living in [place] in 2003.  PMH: Past Medical History:  Diagnosis Date  . Abnormal laboratory test    Sees Dr. Pandit  . Acid reflux   . Allergy bee venom   mainly as a child  . Anxiety 12/08/1969  . Arthritis   . Asthma 12/08/1953   mainly as a child  . Chronic back pain   . Colon cancer (HCC) 11/1999   stage 3 s/p colon resection  . Depression   . History of chemotherapy    5-FU pump/leukovorin  . Insomnia   . Multiple myeloma (HCC) 12/2011   smoldering vs mgus  . Osteoporosis   . Seasonal allergies     Surgical History: Past Surgical History:  Procedure Laterality Date  . COLON SURGERY     resection, 2nd surgery for scar tissue removal  . COLONOSCOPY    . COLONOSCOPY WITH PROPOFOL N/A 03/26/2016   Procedure: COLONOSCOPY WITH PROPOFOL;  Surgeon: Martin U Skulskie, MD;  Location: [facility] ENDOSCOPY;  Service: Endoscopy;   Laterality: N/A;  . ESOPHAGOGASTRODUODENOSCOPY  10/2010  . EYE SURGERY  1994 and 2010   [place]KA in 94 and [place] in 2010  . HERNIA REPAIR  1970   lower left  . LUMBAR FUSION     L4-L5, L5-S1  . SACROILIAC JOINT FUSION    . SHOULDER SURGERY Left   . SPINAL CORD STIMULATOR IMPLANT      Home Medications:  Allergies as of 08/27/2017      Reactions   Bee Venom Swelling   Bees Bees      Medication List        Accurate as of 08/27/17  2:06 PM. Always use your most recent med list.          celecoxib 200 MG capsule Commonly known as:  CELEBREX Take 1 capsule (200 mg total) by mouth daily.   CENTRUM SILVER ULTRA MENS Tabs Take 1 tablet by mouth daily.   CREATINE PO Take by mouth.   dicyclomine 10 MG capsule Commonly known as:  BENTYL Take 1 capsule (10 mg total) by mouth 2 (two) times daily.   NEXIUM 24HR 20 MG capsule Generic drug:  esomeprazole Take by mouth.   ondansetron 4 MG disintegrating tablet Commonly known as:  ZOFRAN ODT Take 1 tablet (4 mg total) by mouth every 8 (eight) hours as needed for nausea or vomiting.   oxyCODONE-acetaminophen 7.5-325 MG tablet   Commonly known as:  PERCOCET Take 1 tablet by mouth every 4 (four) hours as needed for severe pain.   tamsulosin 0.4 MG Caps capsule Commonly known as:  FLOMAX Take 1 capsule (0.4 mg total) by mouth daily after supper.   traZODone 100 MG tablet Commonly known as:  DESYREL Take 1 tablet (100 mg total) by mouth at bedtime as needed. for sleep   Vitamin D 2000 units Caps Take 1 capsule by mouth daily.       Allergies:  Allergies  Allergen Reactions  . Bee Venom Swelling    Bees Bees    Family History: Family History  Problem Relation Age of Onset  . Lymphoma Mother   . Arthritis Mother        deceased  . Cancer Mother   . Lung cancer Father   . Alcohol abuse Father        deceased  . Cancer Father   . Heart disease Unknown        grandparents  . Hyperlipidemia Sister   . Diabetes  Brother   . Diabetes Son   . Alcohol abuse Brother   . Arthritis Brother   . Depression Brother   . Diabetes Brother   . Alcohol abuse Sister   . Arthritis Sister   . Depression Sister   . Alcohol abuse Son   . Early death Son        truck accident 77 at age 66    Social History:  reports that he quit smoking about 47 years ago. His smoking use included cigarettes. He has a 10.00 pack-year smoking history. He has never used smokeless tobacco. He reports that he does not drink alcohol or use drugs.  ROS: UROLOGY Frequent Urination?: Yes Hard to postpone urination?: Yes Burning/pain with urination?: Yes Get up at night to urinate?: Yes Leakage of urine?: Yes Urine stream starts and stops?: Yes Trouble starting stream?: Yes Do you have to strain to urinate?: Yes Blood in urine?: Yes Urinary tract infection?: No Sexually transmitted disease?: No Injury to kidneys or bladder?: No Painful intercourse?: No Weak stream?: Yes Erection problems?: No Penile pain?: No  Gastrointestinal Nausea?: Yes Vomiting?: No Indigestion/heartburn?: Yes Diarrhea?: Yes Constipation?: Yes  Constitutional Fever: No Night sweats?: Yes Weight loss?: No Fatigue?: Yes  Skin Skin rash/lesions?: No Itching?: Yes  Eyes Blurred vision?: No Double vision?: No  Ears/Nose/Throat Sore throat?: No Sinus problems?: No  Hematologic/Lymphatic Swollen glands?: No Easy bruising?: No  Cardiovascular Leg swelling?: No Chest pain?: No  Respiratory Cough?: Yes Shortness of breath?: No  Endocrine Excessive thirst?: Yes  Musculoskeletal Back pain?: Yes Joint pain?: Yes  Neurological Headaches?: Yes Dizziness?: No  Psychologic Depression?: Yes Anxiety?: Yes  Physical Exam: BP (!) 149/79 (BP Location: Left Arm, Patient Position: Sitting, Cuff Size: Normal)   Pulse (!) 58   Ht 5' 7" (1.702 m)   Wt 135 lb 12.8 oz (61.6 kg)   BMI 21.27 kg/m    Constitutional:  Alert and  oriented, No acute distress. HEENT: [place] AT, moist mucus membranes.  Trachea midline, no masses. Cardiovascular: No clubbing, cyanosis, or edema.  RRR Respiratory: Normal respiratory effort, no increased work of breathing.  Lungs clear GI: Abdomen is soft, mild lower quadrant tenderness bilaterally, nondistended, no abdominal masses GU: No CVA tenderness Lymph: No cervical or inguinal lymphadenopathy. Skin: No rashes, bruises or suspicious lesions. Neurologic: Grossly intact, no focal deficits, moving all 4 extremities. Psychiatric: Normal mood and affect.  Laboratory Data: Lab Results  Component Value Date   WBC 7.3 08/23/2017   HGB 14.4 08/23/2017   HCT 40.3 08/23/2017   MCV 95.9 08/23/2017   PLT 381 08/23/2017    Lab Results  Component Value Date   CREATININE 1.32 (H) 08/23/2017    Lab Results  Component Value Date   PSA 1.7 01/11/2017    Urinalysis    Component Value Date/Time   COLORURINE YELLOW (A) 08/23/2017 1843   APPEARANCEUR HAZY (A) 08/23/2017 1843   LABSPEC 1.024 08/23/2017 1843   PHURINE 5.0 08/23/2017 1843   GLUCOSEU NEGATIVE 08/23/2017 1843   HGBUR NEGATIVE 08/23/2017 1843   BILIRUBINUR NEGATIVE 08/23/2017 1843   BILIRUBINUR negative 08/23/2017 1511   KETONESUR NEGATIVE 08/23/2017 1843   PROTEINUR 30 (A) 08/23/2017 1843   UROBILINOGEN 0.2 08/23/2017 1511   NITRITE NEGATIVE 08/23/2017 1843   LEUKOCYTESUR TRACE (A) 08/23/2017 1843    Lab Results  Component Value Date   BACTERIA NONE SEEN 08/23/2017    Pertinent Imaging: Images personally reviewed  Results for orders placed in visit on 08/23/17  DG Abd 1 View   Narrative CLINICAL DATA:  Left lower abdominal pain and nausea. Previous colon resection for colon cancer.  EXAM: ABDOMEN - 1 VIEW  COMPARISON:  Pelvis and sacroiliac joint radiographs dated 08/13/2012.  FINDINGS: Normal bowel gas pattern. 3.6 x 2.8 cm oval area calcification in the left mid abdomen laterally. Anastomotic staples  in the left mid to lower abdomen. L4-5 and L5-S1 laminectomy defects and interbody effusion. Stable bilateral sacroiliac joint fixation hardware. Neural stimulator leads extending into the spinal canal in the lower lumbar spine and extending superiorly into the thoracic spine with their tips not included. 7 mm oval calcification in the right mid abdomen medially. Multiple small prostate calcifications.  IMPRESSION: 1. No acute abnormality. 2. 7 mm calcification in the right mid abdomen medially. This could represent a ureteral calculus, phlebolith or calcified lymph node. 3. 3.6 x 2.8 cm nonspecific calcification in the left mid abdomen laterally.   Electronically Signed   By: Steven  Reid M.D.   On: 08/23/2017 15:40     Results for orders placed during the hospital encounter of 08/23/17  CT Renal Stone Study   Narrative CLINICAL DATA:  Lower abdominal pain, back pain. Recently passed 2 kidney stones. Abdominal radiograph today demonstrated kidney stones.  EXAM: CT ABDOMEN AND PELVIS WITHOUT CONTRAST  TECHNIQUE: Multidetector CT imaging of the abdomen and pelvis was performed following the standard protocol without IV contrast.  COMPARISON:  Abdominal radiograph August 23, 2017  FINDINGS: LOWER CHEST: Lung bases are clear. The visualized heart size is normal. No pericardial effusion.  HEPATOBILIARY: Normal.  PANCREAS: Normal.  SPLEEN: Normal.  ADRENALS/URINARY TRACT: Kidneys are orthotopic, demonstrating normal size and morphology. 5 mm proximal RIGHT ureteral calculus. Mild RIGHT hydronephrosis. Approximately 7 RIGHT nephrolithiasis measuring to 4 mm. At least 3 LEFT nephrolithiasis measuring to 7 mm. Limited assessment for renal masses by nonenhanced CT. The unopacified ureters are normal in course and caliber. Urinary bladder is partially distended and unremarkable. Normal adrenal glands.  STOMACH/BOWEL: The stomach, small and large bowel are normal in course  and caliber without inflammatory changes, sensitivity decreased by lack of enteric contrast. Descending colonic surgical anastomosis. Moderate amount of retained large bowel stool. Normal appendix.  VASCULAR/LYMPHATIC: Aortoiliac vessels are normal in course and caliber. Trace calcific atherosclerosis. No lymphadenopathy by CT size criteria.  REPRODUCTIVE: Mild prostatomegaly.  OTHER: No intraperitoneal free fluid or free air. 2.7 cm calcified mass LEFT omentum   could reflect old injury or infarct.  MUSCULOSKELETAL: Non-acute. Small bilateral fat containing inguinal hernias. Bilateral sacroiliac struts. Spinal stimulator battery pack LEFT gluteal soft tissues, L1-2 interlaminar approach. Status post L4-5 interbody fusion with pseudoarthrosis. L5-S1 arthrodesis. L4-5 and L5-S1 laminectomies. Mild levoscoliosis on this nonweightbearing examination.  IMPRESSION: 1. 7 mm proximal RIGHT ureteral calculus resulting in mild hydronephrosis. 2. Bilateral nephrolithiasis measuring to 7 mm on the LEFT. 3. Moderate amount of retained large bowel stool. No bowel obstruction. 4. L4-5 pseudoarthrosis.   Electronically Signed   By: Courtnay  Bloomer M.D.   On: 08/23/2017 19:17     Assessment & Plan:   67-year-old male with an obstructing 7 mm right proximal ureteral calculus.  He states he cannot undergo shockwave lithotripsy due to a spinal cord stimulator.  I discussed ureteroscopic removal which he desires to schedule.  Will also attempt to clear his right renal calculi in addition to the proximal ureteral stone.  The indications and nature of the planned procedure were discussed as well as the potential  benefits and expected outcome.  Alternatives have been discussed in detail. The most common complications and side effects were discussed including but not limited to infection/sepsis; blood loss; damage to urethra, bladder, ureter, kidney; need for multiple surgeries; need for prolonged  stent placement as well as general anesthesia risks. Although uncommon he was also informed of the possibility that the calculus may not be able to be treated due to inability to obtain access to the upper ureter. In that event he would require stent placement and a follow-up procedure after a period of stent dilation. All of his questions were answered and he desires to proceed.  At the time of our visit he did not appear to be distracted or in pain.   Scott C Stoioff, MD   [facility] [address], [address] [place], [place] [address] [phone]  

## 2017-08-28 ENCOUNTER — Other Ambulatory Visit: Payer: Self-pay | Admitting: Radiology

## 2017-08-28 ENCOUNTER — Encounter
Admission: RE | Admit: 2017-08-28 | Discharge: 2017-08-28 | Disposition: A | Discharge status: Home / Self Care | Payer: Medicare Other | Source: Ambulatory Visit | Attending: Urology | Admitting: Urology

## 2017-08-28 ENCOUNTER — Encounter: Payer: Self-pay | Admitting: Urology

## 2017-08-28 ENCOUNTER — Other Ambulatory Visit: Payer: Self-pay

## 2017-08-28 DIAGNOSIS — Z01812 Encounter for preprocedural laboratory examination: Secondary | ICD-10-CM | POA: Diagnosis not present

## 2017-08-28 DIAGNOSIS — Z01810 Encounter for preprocedural cardiovascular examination: Secondary | ICD-10-CM | POA: Insufficient documentation

## 2017-08-28 DIAGNOSIS — N23 Unspecified renal colic: Secondary | ICD-10-CM | POA: Insufficient documentation

## 2017-08-28 DIAGNOSIS — N200 Calculus of kidney: Secondary | ICD-10-CM

## 2017-08-28 DIAGNOSIS — I10 Essential (primary) hypertension: Secondary | ICD-10-CM | POA: Diagnosis not present

## 2017-08-28 DIAGNOSIS — R001 Bradycardia, unspecified: Secondary | ICD-10-CM | POA: Insufficient documentation

## 2017-08-28 DIAGNOSIS — N201 Calculus of ureter: Secondary | ICD-10-CM | POA: Insufficient documentation

## 2017-08-28 HISTORY — DX: Personal history of other infectious and parasitic diseases: Z86.19

## 2017-08-28 HISTORY — DX: Calculus of kidney: N20.0

## 2017-08-28 HISTORY — DX: Personal history of urinary calculi: Z87.442

## 2017-08-28 HISTORY — DX: Hypothyroidism, unspecified: E03.9

## 2017-08-28 MED ORDER — OXYCODONE-ACETAMINOPHEN 7.5-325 MG PO TABS: 1.0000 | ORAL_TABLET | ORAL | 0 refills | Status: DC | PRN

## 2017-08-28 NOTE — Patient Instructions (Signed)
Your procedure is scheduled on: Tuesday, September 03, 2017 Report to Day Surgery on the 2nd floor of the [facility]. To find out your arrival time, please call [phone] between 1PM - 3PM on: Monday, September 02, 2017  REMEMBER: Instructions that are not followed completely may result in serious medical risk, up to and including death; or upon the discretion of your surgeon and anesthesiologist your surgery may need to be rescheduled.  Do not eat food after midnight the night before your procedure.  No gum chewing, lozengers or hard candies.  You may however, drink CLEAR liquids up to 2 hours before you are scheduled to arrive for your surgery. Do not drink anything within 2 hours of the start of your surgery.  Clear liquids include: - water  - apple juice without pulp - clear gatorade - black coffee or tea (Do NOT add anything to the coffee or tea) Do NOT drink anything that is not on this list.  No Alcohol for 24 hours before or after surgery.  No Smoking including e-cigarettes for 24 hours prior to surgery.  No chewable tobacco products for at least 6 hours prior to surgery.  No nicotine patches on the day of surgery.  On the morning of surgery brush your teeth with toothpaste and water, you may rinse your mouth with mouthwash if you wish. Do not swallow any toothpaste or mouthwash.  Notify your doctor if there is any change in your medical condition (cold, fever, infection).  Do not wear jewelry, make-up, hairpins, clips or nail polish.  Do not wear lotions, powders, or perfumes. You may wear deodorant.  Do not shave 48 hours prior to surgery. Men may shave face and neck.  Contacts and dentures may not be worn into surgery.  Do not bring valuables to the hospital, including drivers license, insurance or credit cards.  [facility] is not responsible for any belongings or valuables.   TAKE THESE MEDICATIONS THE MORNING OF SURGERY:  1.  nexium (take one the night before  surgery and one on the morning of surgery - helps to prevent nausea) 2.  Oxycodone (if needed for pain)  NOW!  Stop Anti-inflammatories (NSAIDS) such as CELEBREX, Advil, Aleve, Ibuprofen, Motrin, Naproxen, Naprosyn and Aspirin based products such as Excedrin, Goodys Powder, BC Powder. (May take Tylenol or Acetaminophen if needed.)  NOW!  Stop ANY OVER THE COUNTER supplements until after surgery. (CREATINE) (May continue Vitamin D.)  Wear comfortable clothing (specific to your surgery type) to the hospital.  If you are being discharged the day of surgery, you will not be allowed to drive home. You will need a responsible adult to drive you home and stay with you that night.   If you are taking public transportation, you will need to have a responsible adult with you. Please confirm with your physician that it is acceptable to use public transportation.   Please call [phone] if you have any questions about these instructions.

## 2017-08-29 ENCOUNTER — Ambulatory Visit: Payer: Medicare Other

## 2017-08-29 LAB — URINE CULTURE: CULTURE: NO GROWTH

## 2017-08-29 NOTE — Progress Notes (Signed)
Patient notified

## 2017-08-30 ENCOUNTER — Telehealth: Payer: Self-pay | Admitting: Radiology

## 2017-08-30 MED ORDER — OXYCODONE-ACETAMINOPHEN 7.5-325 MG PO TABS
1.0000 | ORAL_TABLET | ORAL | 0 refills | Status: DC | PRN
Start: 2017-08-30 — End: 2017-09-08

## 2017-08-30 NOTE — Telephone Encounter (Signed)
LMOM to notify patient of script sent to pharmacy. 

## 2017-08-30 NOTE — Telephone Encounter (Signed)
Patient requests refill of percocet 7.5/325mg  to last until surgery scheduled 09/03/2017.

## 2017-08-30 NOTE — Telephone Encounter (Signed)
rx sent

## 2017-09-02 MED ORDER — CEFAZOLIN SODIUM-DEXTROSE 2-4 GM/100ML-% IV SOLN
2.0000 g | INTRAVENOUS | Status: AC
  Administered 2017-09-03: 2 g via INTRAVENOUS

## 2017-09-03 ENCOUNTER — Ambulatory Visit: Payer: Medicare Other | Admitting: Anesthesiology

## 2017-09-03 ENCOUNTER — Ambulatory Visit
Admission: RE | Admit: 2017-09-03 | Discharge: 2017-09-03 | Disposition: A | Discharge status: Home / Self Care | Payer: Medicare Other | Source: Ambulatory Visit | Attending: Urology | Admitting: Urology

## 2017-09-03 ENCOUNTER — Encounter
Admission: RE | Disposition: A | Discharge status: Home / Self Care | Payer: Self-pay | Source: Ambulatory Visit | Attending: Urology

## 2017-09-03 ENCOUNTER — Encounter: Payer: Self-pay | Admitting: Anesthesiology

## 2017-09-03 DIAGNOSIS — Z79899 Other long term (current) drug therapy: Secondary | ICD-10-CM | POA: Insufficient documentation

## 2017-09-03 DIAGNOSIS — N202 Calculus of kidney with calculus of ureter: Secondary | ICD-10-CM | POA: Diagnosis not present

## 2017-09-03 DIAGNOSIS — N200 Calculus of kidney: Secondary | ICD-10-CM

## 2017-09-03 DIAGNOSIS — F419 Anxiety disorder, unspecified: Secondary | ICD-10-CM | POA: Diagnosis not present

## 2017-09-03 DIAGNOSIS — Z87891 Personal history of nicotine dependence: Secondary | ICD-10-CM | POA: Diagnosis not present

## 2017-09-03 DIAGNOSIS — F329 Major depressive disorder, single episode, unspecified: Secondary | ICD-10-CM | POA: Insufficient documentation

## 2017-09-03 DIAGNOSIS — K219 Gastro-esophageal reflux disease without esophagitis: Secondary | ICD-10-CM | POA: Diagnosis not present

## 2017-09-03 DIAGNOSIS — N23 Unspecified renal colic: Secondary | ICD-10-CM

## 2017-09-03 DIAGNOSIS — N201 Calculus of ureter: Secondary | ICD-10-CM

## 2017-09-03 HISTORY — PX: CYSTOSCOPY/URETEROSCOPY/HOLMIUM LASER/STENT PLACEMENT: SHX6546

## 2017-09-03 SURGERY — CYSTOSCOPY/URETEROSCOPY/HOLMIUM LASER/STENT PLACEMENT
Anesthesia: General | Site: Ureter | Laterality: Right | Wound class: "Clean Contaminated "

## 2017-09-03 MED ORDER — MEPERIDINE HCL 50 MG/ML IJ SOLN
25.0000 mg | INTRAMUSCULAR | Status: DC | PRN
  Administered 2017-09-03 (×2): 25 mg via INTRAVENOUS

## 2017-09-03 MED ORDER — OXYCODONE-ACETAMINOPHEN 7.5-325 MG PO TABS
ORAL_TABLET | ORAL | Status: AC
  Filled 2017-09-03: qty 1

## 2017-09-03 MED ORDER — SODIUM CHLORIDE FLUSH 0.9 % IV SOLN
INTRAVENOUS | Status: AC
  Filled 2017-09-03: qty 10

## 2017-09-03 MED ORDER — FENTANYL CITRATE (PF) 100 MCG/2ML IJ SOLN
INTRAMUSCULAR | Status: DC | PRN
  Administered 2017-09-03 (×2): 50 ug via INTRAVENOUS

## 2017-09-03 MED ORDER — ONDANSETRON HCL 4 MG/2ML IJ SOLN
INTRAMUSCULAR | Status: DC | PRN
  Administered 2017-09-03: 4 mg via INTRAVENOUS

## 2017-09-03 MED ORDER — PROPOFOL 10 MG/ML IV BOLUS
INTRAVENOUS | Status: AC
  Filled 2017-09-03: qty 20

## 2017-09-03 MED ORDER — OXYCODONE-ACETAMINOPHEN 7.5-325 MG PO TABS: 1.0000 | ORAL_TABLET | Freq: Once | ORAL | Status: DC

## 2017-09-03 MED ORDER — FENTANYL CITRATE (PF) 100 MCG/2ML IJ SOLN: 25.0000 ug | INTRAMUSCULAR | Status: DC | PRN

## 2017-09-03 MED ORDER — ONDANSETRON HCL 4 MG/2ML IJ SOLN
INTRAMUSCULAR | Status: AC
  Filled 2017-09-03: qty 2

## 2017-09-03 MED ORDER — DEXAMETHASONE SODIUM PHOSPHATE 10 MG/ML IJ SOLN
INTRAMUSCULAR | Status: DC | PRN
  Administered 2017-09-03: 10 mg via INTRAVENOUS

## 2017-09-03 MED ORDER — PHENYLEPHRINE HCL 10 MG/ML IJ SOLN
INTRAMUSCULAR | Status: DC | PRN
  Administered 2017-09-03 (×2): 100 ug via INTRAVENOUS

## 2017-09-03 MED ORDER — MIDAZOLAM HCL 2 MG/2ML IJ SOLN
INTRAMUSCULAR | Status: AC
  Filled 2017-09-03: qty 2

## 2017-09-03 MED ORDER — ROCURONIUM BROMIDE 100 MG/10ML IV SOLN
INTRAVENOUS | Status: DC | PRN
  Administered 2017-09-03 (×2): 20 mg via INTRAVENOUS
  Administered 2017-09-03: 10 mg via INTRAVENOUS

## 2017-09-03 MED ORDER — ROCURONIUM BROMIDE 50 MG/5ML IV SOLN
INTRAVENOUS | Status: AC
  Filled 2017-09-03: qty 1

## 2017-09-03 MED ORDER — FENTANYL CITRATE (PF) 100 MCG/2ML IJ SOLN
INTRAMUSCULAR | Status: AC
  Filled 2017-09-03: qty 2

## 2017-09-03 MED ORDER — DEXAMETHASONE SODIUM PHOSPHATE 10 MG/ML IJ SOLN
INTRAMUSCULAR | Status: AC
  Filled 2017-09-03: qty 1

## 2017-09-03 MED ORDER — SUGAMMADEX SODIUM 200 MG/2ML IV SOLN
INTRAVENOUS | Status: AC
  Filled 2017-09-03: qty 2

## 2017-09-03 MED ORDER — SUGAMMADEX SODIUM 200 MG/2ML IV SOLN
INTRAVENOUS | Status: DC | PRN
  Administered 2017-09-03: 125 mg via INTRAVENOUS

## 2017-09-03 MED ORDER — OXYBUTYNIN CHLORIDE 5 MG PO TABS: ORAL_TABLET | ORAL | 0 refills | Status: DC

## 2017-09-03 MED ORDER — IOTHALAMATE MEGLUMINE 43 % IV SOLN
INTRAVENOUS | Status: DC | PRN
  Administered 2017-09-03: 15 mL via URETHRAL

## 2017-09-03 MED ORDER — OXYCODONE HCL 5 MG PO TABS
5.0000 mg | ORAL_TABLET | Freq: Once | ORAL | Status: AC
  Administered 2017-09-03: 5 mg via ORAL

## 2017-09-03 MED ORDER — OXYCODONE HCL 5 MG PO TABS
ORAL_TABLET | ORAL | Status: AC
  Filled 2017-09-03: qty 1

## 2017-09-03 MED ORDER — ONDANSETRON HCL 4 MG/2ML IJ SOLN: 4.0000 mg | Freq: Once | INTRAMUSCULAR | Status: DC | PRN

## 2017-09-03 MED ORDER — GLYCOPYRROLATE 0.2 MG/ML IJ SOLN
INTRAMUSCULAR | Status: DC | PRN
  Administered 2017-09-03: 0.2 mg via INTRAVENOUS

## 2017-09-03 MED ORDER — MIDAZOLAM HCL 2 MG/2ML IJ SOLN
INTRAMUSCULAR | Status: DC | PRN
  Administered 2017-09-03: 2 mg via INTRAVENOUS

## 2017-09-03 MED ORDER — MEPERIDINE HCL 50 MG/ML IJ SOLN
INTRAMUSCULAR | Status: AC
  Filled 2017-09-03: qty 1

## 2017-09-03 MED ORDER — CEFAZOLIN SODIUM-DEXTROSE 2-4 GM/100ML-% IV SOLN
INTRAVENOUS | Status: AC
  Filled 2017-09-03: qty 100

## 2017-09-03 MED ORDER — LACTATED RINGERS IV SOLN
INTRAVENOUS | Status: DC
  Administered 2017-09-03: 10:00:00 via INTRAVENOUS

## 2017-09-03 SURGICAL SUPPLY — 27 items
BAG DRAIN CYSTO-URO LG1000N (MISCELLANEOUS) ×2 IMPLANT
BASKET ZERO TIP 1.9FR (BASKET) ×2 IMPLANT
BRUSH SCRUB EZ 1% IODOPHOR (MISCELLANEOUS) ×2 IMPLANT
CATH URETL 5X70 OPEN END (CATHETERS) ×1 IMPLANT
CNTNR SPEC 2.5X3XGRAD LEK (MISCELLANEOUS) ×1
CONRAY 43 FOR UROLOGY 50M (MISCELLANEOUS) ×2 IMPLANT
CONT SPEC 4OZ STER OR WHT (MISCELLANEOUS) ×1
CONTAINER SPEC 2.5X3XGRAD LEK (MISCELLANEOUS) IMPLANT
DRAPE UTILITY 15X26 TOWEL STRL (DRAPES) ×2 IMPLANT
FIBER LASER LITHO 273 (Laser) ×1 IMPLANT
GLOVE BIO SURGEON STRL SZ8 (GLOVE) ×2 IMPLANT
GOWN STRL REUS W/ TWL LRG LVL3 (GOWN DISPOSABLE) ×2 IMPLANT
GOWN STRL REUS W/TWL LRG LVL3 (GOWN DISPOSABLE) ×2
INFUSOR MANOMETER BAG 3000ML (MISCELLANEOUS) ×2 IMPLANT
INTRODUCER DILATOR DOUBLE (INTRODUCER) ×1 IMPLANT
KIT TURNOVER CYSTO (KITS) ×2 IMPLANT
PACK CYSTO AR (MISCELLANEOUS) ×2 IMPLANT
SENSORWIRE 0.038 NOT ANGLED (WIRE) ×4
SET CYSTO W/LG BORE CLAMP LF (SET/KITS/TRAYS/PACK) ×2 IMPLANT
SHEATH URETERAL 12FRX35CM (MISCELLANEOUS) ×1 IMPLANT
SOL .9 NS 3000ML IRR  AL (IV SOLUTION) ×1
SOL .9 NS 3000ML IRR UROMATIC (IV SOLUTION) ×1 IMPLANT
STENT URET 6FRX24 CONTOUR (STENTS) ×1 IMPLANT
STENT URET 6FRX26 CONTOUR (STENTS) IMPLANT
SURGILUBE 2OZ TUBE FLIPTOP (MISCELLANEOUS) ×2 IMPLANT
WATER STERILE IRR 1000ML POUR (IV SOLUTION) ×2 IMPLANT
WIRE SENSOR 0.038 NOT ANGLED (WIRE) ×1 IMPLANT

## 2017-09-03 NOTE — Progress Notes (Signed)
Dr. Kayleen Memos aware of pain level now a 5 out of ten.  Patient is more relaxed and calm.  May proceed On to post op area.

## 2017-09-03 NOTE — Op Note (Signed)
Preoperative diagnosis:  1.  Right proximal ureteral calculus 2.  Right nephrolithiasis   Postoperative diagnosis:  1.  Right proximal ureteral calculus 2.  Right nephrolithiasis   Procedure: 1. Cystoscopy 2. Right uretero-pyeloscopy and stone removal 3. Ureteroscopic laser lithotripsy 4. Right ureteral stent placement (6Fr) 24cm 5. Right retrograde pyelography with interpretation  Surgeon: Nicki Reaper C. Kenyatte Chatmon, M.D.  Anesthesia: General  Complications: None  Intraoperative findings:  1.Right retrograde pyelography post procedure showed no filling defects, stone fragments or contrast extravasation  2.  Right proximal ureteral calculus.  Multiple right calyceal calculi  EBL: Minimal  Specimens: 1. Calculus fragments for analysis   Indication: [name] is a 67 y.o. year old patient with renal colic secondary to an obstructing 7 mm right proximal ureteral calculus.  He also has multiple nonobstructing right renal calculi measuring from 3-5 mm in size. After reviewing the management options for treatment, the patient elected to proceed with the above surgical procedure(s). We have discussed the potential benefits and risks of the procedure, side effects of the proposed treatment, the likelihood of the patient achieving the goals of the procedure, and any potential problems that might occur during the procedure or recuperation. Informed consent has been obtained.  Description of procedure:  The patient was taken to the operating room and general anesthesia was induced.  The patient was placed in the dorsal lithotomy position, prepped and draped in the usual sterile fashion, and preoperative antibiotics were administered. A preoperative time-out was performed.   A 22 French cystoscope was lubricated and passed under direct vision.  The urethra was normal in caliber without stricture.  The prostate demonstrated moderate lateral lobe enlargement and mild bladder neck elevation.   Panendoscopy was performed and the bladder mucosa showed no erythema, solid or papillary lesions.  Attention was directed to the right ureteral orifice and a 0.038 Sensor wire was then advanced up the ureter into the renal pelvis under fluoroscopic guidance.  The cystoscope was removed and a dual-lumen catheter was placed over the sensor wire and a second sensor wire was placed in a similar fashion.  A 12/14 French ureteral access sheath was placed over the working wire under fluoroscopic guidance without difficulty and only advanced to the mid ureter.  A digital flexible ureteroscope was placed through the access sheath and the 7 mm calculus was identified in the proximal ureter.   A 273 micron holmium laser fiber was placed through the ureteroscope and the stone was dusted/fragmented at a setting of 0.3 J and frequency of 30 hz.  Larger fragments were removed with a 1.9 French 0 tip nitinol basket.  Once the ureteral calculus was cleared the ureteroscope was advanced to the renal pelvis.  No additional calculi were noted in the proximal ureter.  The guidewire was replaced through the access sheath to the renal pelvis under fluoroscopic guidance and the inner stylette was placed over the wire.  The sheath was advanced to the region of the UPJ.  The ureteroscope was readvanced to the renal pelvis.  3 stones approximately 3 mm in size were identified, placed in the basket and removed.  Several small 2 mm calculi noted in the upper pole calyx were removed in a similar fashion.  There were 2 larger 5 mm fragments which were placed in the basket and brought to the distal portion of the access sheath.  These required additional fragmentation within the basket in order to remove through the sheath.  Retrograde pyelogram was performed and each  calyx was sequentially examined under fluoroscopic guidance and no significant size fragments were identified.  The ureteral access sheath and ureteroscope were  removed in tandem and the ureter showed no evidence of injury or perforation.  A 6Fr/24cm stent was was placed under fluoroscopic guidance.  The wire was then removed with an adequate stent curl noted in the renal pelvis as well as in the bladder.  The bladder was then emptied and the procedure ended.  The patient appeared to tolerate the procedure well and without complications.  After anesthetic reversal the patient was transported to the PACU in stable condition.   [name], MD

## 2017-09-03 NOTE — Anesthesia Post-op Follow-up Note (Signed)
Anesthesia QCDR form completed.        

## 2017-09-03 NOTE — Anesthesia Preprocedure Evaluation (Signed)
Anesthesia Evaluation  Patient identified by MRN, date of birth, ID band Patient awake    Reviewed: Allergy & Precautions, NPO status , Patient's Chart, lab work & pertinent test results  History of Anesthesia Complications Negative for: history of anesthetic complications  Airway Mallampati: III  TM Distance: >3 FB Neck ROM: Full    Dental  (+) Poor Dentition   Pulmonary asthma (childhood asthma) , neg sleep apnea, former smoker,    breath sounds clear to auscultation- rhonchi (-) wheezing      Cardiovascular Exercise Tolerance: Good (-) hypertension(-) CAD and (-) Past MI negative cardio ROS   Rhythm:Regular Rate:Normal - Systolic murmurs and - Diastolic murmurs    Neuro/Psych PSYCHIATRIC DISORDERS Anxiety Depression negative neurological ROS     GI/Hepatic Neg liver ROS, GERD  Medicated,  Endo/Other  negative endocrine ROSneg diabetes  Renal/GU negative Renal ROSstones     Musculoskeletal  (+) Arthritis ,   Abdominal (+) - obese,   Peds negative pediatric ROS (+)  Hematology negative hematology ROS (+)   Anesthesia Other Findings Past Medical History: No date: Abnormal laboratory test     Comment: Sees Dr. Ma Hillock No date: Acid reflux No date: Arthritis No date: Chronic back pain 11/1999: Colon cancer ([place])     Comment: stage 3 s/p colon resection No date: Depression No date: History of chemotherapy     Comment: 5-FU pump/leukovorin No date: Insomnia 12/2011: Multiple myeloma (HCC)     Comment: smoldering vs mgus No date: Osteoporosis No date: Seasonal allergies   Reproductive/Obstetrics                             Anesthesia Physical  Anesthesia Plan  ASA: II  Anesthesia Plan: General   Post-op Pain Management:    Induction: Intravenous  PONV Risk Score and Plan:   Airway Management Planned: Oral ETT  Additional Equipment:   Intra-op Plan:   Post-operative  Plan: Extubation in OR  Informed Consent: I have reviewed the patients History and Physical, chart, labs and discussed the procedure including the risks, benefits and alternatives for the proposed anesthesia with the patient or authorized representative who has indicated his/her understanding and acceptance.   Dental advisory given  Plan Discussed with: CRNA and Anesthesiologist  Anesthesia Plan Comments:         Anesthesia Quick Evaluation

## 2017-09-03 NOTE — Anesthesia Procedure Notes (Signed)
Procedure Name: Intubation Performed by: Timoteo Expose, CRNA Pre-anesthesia Checklist: Suction available, Patient identified and Emergency Drugs available Patient Re-evaluated:Patient Re-evaluated prior to induction Oxygen Delivery Method: Circle system utilized Preoxygenation: Pre-oxygenation with 100% oxygen Induction Type: IV induction Ventilation: Mask ventilation without difficulty Laryngoscope Size: Mac and 3 Grade View: Grade III Tube type: Oral Tube size: 7.0 mm Airway Equipment and Method: Stylet Placement Confirmation: ETT inserted through vocal cords under direct vision,  positive ETCO2 and breath sounds checked- equal and bilateral Secured at: 22 cm Tube secured with: Tape

## 2017-09-03 NOTE — Discharge Instructions (Signed)

## 2017-09-03 NOTE — Transfer of Care (Signed)
Immediate Anesthesia Transfer of Care Note  Patient: [name]  Procedure(s) Performed: CYSTOSCOPY/URETEROSCOPY/HOLMIUM LASER/STENT PLACEMENT (Right Ureter)  Patient Location: PACU  Anesthesia Type:General  Level of Consciousness: awake  Airway & Oxygen Therapy: Patient connected to face mask oxygen  Post-op Assessment: Post -op Vital signs reviewed and stable  Post vital signs: stable  Last Vitals:  Vitals Value Taken Time  BP    Temp 36.1 C 09/03/2017 12:16 PM  Pulse 103 09/03/2017 12:16 PM  Resp 19 09/03/2017 12:16 PM  SpO2 100 % 09/03/2017 12:16 PM  Vitals shown include unvalidated device data.  Last Pain:  Vitals:   09/03/17 1216  TempSrc: Temporal  PainSc:          Complications: No apparent anesthesia complications

## 2017-09-03 NOTE — Interval H&P Note (Signed)
History and Physical Interval Note:  09/03/2017 10:34 AM  [name]  has presented today for surgery, with the diagnosis of right ureteral calculus, right nephrolithiasis  The various methods of treatment have been discussed with the patient and family. After consideration of risks, benefits and other options for treatment, the patient has consented to  Procedure(s): CYSTOSCOPY/URETEROSCOPY/HOLMIUM LASER/STENT PLACEMENT (Right) as a surgical intervention .  The patient's history has been reviewed, patient examined, no change in status, stable for surgery.  I have reviewed the patient's chart and labs.  Questions were answered to the patient's satisfaction.     [place]

## 2017-09-03 NOTE — Anesthesia Postprocedure Evaluation (Signed)
Anesthesia Post Note  Patient: [name]  Procedure(s) Performed: CYSTOSCOPY/URETEROSCOPY/HOLMIUM LASER/STENT PLACEMENT (Right Ureter)  Patient location during evaluation: PACU Anesthesia Type: General Level of consciousness: awake and alert and oriented Pain management: pain level controlled Vital Signs Assessment: post-procedure vital signs reviewed and stable Respiratory status: spontaneous breathing Cardiovascular status: blood pressure returned to baseline Anesthetic complications: no     Last Vitals:  Vitals:   09/03/17 1413 09/03/17 1450  BP: (!) 176/85 140/67  Pulse:  68  Resp: 16 16  Temp: 36.8 C   SpO2: 100% 99%    Last Pain:  Vitals:   09/03/17 1450  TempSrc:   PainSc: 7                  Aeris Hersman

## 2017-09-04 ENCOUNTER — Encounter: Payer: Self-pay | Admitting: Urology

## 2017-09-06 ENCOUNTER — Telehealth: Payer: Self-pay | Admitting: Radiology

## 2017-09-06 ENCOUNTER — Other Ambulatory Visit: Payer: Self-pay | Admitting: Radiology

## 2017-09-06 DIAGNOSIS — N200 Calculus of kidney: Secondary | ICD-10-CM

## 2017-09-06 LAB — STONE ANALYSIS
CA OXALATE, DIHYDRATE: 2 %
CA OXALATE, MONOHYDR.: 93 %
Ca phos cry stone ql IR: 5 %
STONE WEIGHT KSTONE: 110 mg

## 2017-09-06 NOTE — Telephone Encounter (Signed)
Patient reports continued left flank pain post treatment of right ureteral stone. Patient requests refill of percocet. Advised patient that left stone could be addressed after the KUB & encouraged patient to alternate ibuprofen & acetaminophen until refill can be sent to pharmacy. Patient voices understanding.  Patient requests refill of percocet.

## 2017-09-08 MED ORDER — OXYCODONE-ACETAMINOPHEN 7.5-325 MG PO TABS: 1.0000 | ORAL_TABLET | ORAL | 0 refills | Status: DC | PRN

## 2017-09-08 NOTE — Telephone Encounter (Signed)
rx sent

## 2017-09-11 ENCOUNTER — Ambulatory Visit
Admission: RE | Admit: 2017-09-11 | Discharge: 2017-09-11 | Disposition: A | Discharge status: Home / Self Care | Payer: Medicare Other | Source: Ambulatory Visit | Attending: Urology | Admitting: Urology

## 2017-09-11 ENCOUNTER — Encounter: Payer: Self-pay | Admitting: Urology

## 2017-09-11 ENCOUNTER — Ambulatory Visit (INDEPENDENT_AMBULATORY_CARE_PROVIDER_SITE_OTHER): Payer: Medicare Other | Admitting: Urology

## 2017-09-11 VITALS — BP 126/74 | HR 61 | Ht 67.0 in | Wt 130.6 lb

## 2017-09-11 DIAGNOSIS — N201 Calculus of ureter: Secondary | ICD-10-CM | POA: Diagnosis not present

## 2017-09-11 DIAGNOSIS — N200 Calculus of kidney: Secondary | ICD-10-CM | POA: Insufficient documentation

## 2017-09-11 LAB — URINALYSIS, COMPLETE
Bilirubin, UA: NEGATIVE
Glucose, UA: NEGATIVE
Ketones, UA: NEGATIVE
NITRITE UA: NEGATIVE
Specific Gravity, UA: 1.015 (ref 1.005–1.030)
Urobilinogen, Ur: 0.2 mg/dL (ref 0.2–1.0)
pH, UA: 5.5 (ref 5.0–7.5)

## 2017-09-11 LAB — MICROSCOPIC EXAMINATION: EPITHELIAL CELLS (NON RENAL): NONE SEEN /HPF (ref 0–10)

## 2017-09-11 MED ORDER — LIDOCAINE HCL URETHRAL/MUCOSAL 2 % EX GEL
1.0000 "application " | Freq: Once | CUTANEOUS | Status: AC
  Administered 2017-09-11: 1 via URETHRAL

## 2017-09-11 MED ORDER — CIPROFLOXACIN HCL 500 MG PO TABS
500.0000 mg | ORAL_TABLET | Freq: Once | ORAL | Status: AC
  Administered 2017-09-11: 500 mg via ORAL

## 2017-09-12 ENCOUNTER — Emergency Department
Admission: EM | Admit: 2017-09-12 | Discharge: 2017-09-12 | Disposition: A | Discharge status: Home / Self Care | Payer: Medicare Other | Attending: Emergency Medicine | Admitting: Emergency Medicine

## 2017-09-12 ENCOUNTER — Emergency Department: Payer: Medicare Other

## 2017-09-12 ENCOUNTER — Encounter: Payer: Self-pay | Admitting: Emergency Medicine

## 2017-09-12 DIAGNOSIS — R1032 Left lower quadrant pain: Secondary | ICD-10-CM | POA: Insufficient documentation

## 2017-09-12 DIAGNOSIS — Z87891 Personal history of nicotine dependence: Secondary | ICD-10-CM | POA: Diagnosis not present

## 2017-09-12 DIAGNOSIS — F329 Major depressive disorder, single episode, unspecified: Secondary | ICD-10-CM | POA: Insufficient documentation

## 2017-09-12 DIAGNOSIS — E039 Hypothyroidism, unspecified: Secondary | ICD-10-CM | POA: Diagnosis not present

## 2017-09-12 DIAGNOSIS — Z85038 Personal history of other malignant neoplasm of large intestine: Secondary | ICD-10-CM | POA: Diagnosis not present

## 2017-09-12 DIAGNOSIS — F419 Anxiety disorder, unspecified: Secondary | ICD-10-CM | POA: Diagnosis not present

## 2017-09-12 DIAGNOSIS — J45909 Unspecified asthma, uncomplicated: Secondary | ICD-10-CM | POA: Insufficient documentation

## 2017-09-12 DIAGNOSIS — R109 Unspecified abdominal pain: Secondary | ICD-10-CM | POA: Diagnosis not present

## 2017-09-12 DIAGNOSIS — N200 Calculus of kidney: Secondary | ICD-10-CM | POA: Diagnosis not present

## 2017-09-12 DIAGNOSIS — Z79899 Other long term (current) drug therapy: Secondary | ICD-10-CM | POA: Diagnosis not present

## 2017-09-12 DIAGNOSIS — R1012 Left upper quadrant pain: Secondary | ICD-10-CM | POA: Diagnosis not present

## 2017-09-12 LAB — URINALYSIS, COMPLETE (UACMP) WITH MICROSCOPIC
BILIRUBIN URINE: NEGATIVE
Bacteria, UA: NONE SEEN
Glucose, UA: NEGATIVE mg/dL
HGB URINE DIPSTICK: NEGATIVE
Ketones, ur: 5 mg/dL — AB
Leukocytes, UA: NEGATIVE
NITRITE: NEGATIVE
PH: 7 (ref 5.0–8.0)
Protein, ur: NEGATIVE mg/dL
SPECIFIC GRAVITY, URINE: 1.009 (ref 1.005–1.030)
Squamous Epithelial / LPF: NONE SEEN (ref 0–5)

## 2017-09-12 LAB — CBC WITH DIFFERENTIAL/PLATELET
BASOS PCT: 0 %
Basophils Absolute: 0 10*3/uL (ref 0–0.1)
Eosinophils Absolute: 0.1 10*3/uL (ref 0–0.7)
Eosinophils Relative: 2 %
HCT: 42.5 % (ref 40.0–52.0)
HEMOGLOBIN: 14.8 g/dL (ref 13.0–18.0)
Lymphocytes Relative: 41 %
Lymphs Abs: 3.1 10*3/uL (ref 1.0–3.6)
MCH: 32.9 pg (ref 26.0–34.0)
MCHC: 34.8 g/dL (ref 32.0–36.0)
MCV: 94.8 fL (ref 80.0–100.0)
MONOS PCT: 7 %
Monocytes Absolute: 0.5 10*3/uL (ref 0.2–1.0)
NEUTROS PCT: 50 %
Neutro Abs: 3.7 10*3/uL (ref 1.4–6.5)
Platelets: 424 10*3/uL (ref 150–440)
RBC: 4.48 MIL/uL (ref 4.40–5.90)
RDW: 12.1 % (ref 11.5–14.5)
WBC: 7.5 10*3/uL (ref 3.8–10.6)

## 2017-09-12 LAB — BASIC METABOLIC PANEL
Anion gap: 9 (ref 5–15)
BUN: 17 mg/dL (ref 8–23)
CALCIUM: 9.3 mg/dL (ref 8.9–10.3)
CHLORIDE: 109 mmol/L (ref 98–111)
CO2: 22 mmol/L (ref 22–32)
CREATININE: 1.18 mg/dL (ref 0.61–1.24)
GFR calc non Af Amer: >60 mL/min (ref >60)
Glucose, Bld: 127 mg/dL — ABNORMAL HIGH (ref 70–99)
Potassium: 4 mmol/L (ref 3.5–5.1)
SODIUM: 140 mmol/L (ref 135–145)

## 2017-09-12 MED ORDER — HALOPERIDOL LACTATE 5 MG/ML IJ SOLN
5.0000 mg | Freq: Once | INTRAMUSCULAR | Status: AC
  Administered 2017-09-12: 5 mg via INTRAVENOUS
  Filled 2017-09-12: qty 1

## 2017-09-12 MED ORDER — KETOROLAC TROMETHAMINE 30 MG/ML IJ SOLN
30.0000 mg | Freq: Once | INTRAMUSCULAR | Status: AC
  Administered 2017-09-12: 30 mg via INTRAVENOUS
  Filled 2017-09-12: qty 1

## 2017-09-12 MED ORDER — SODIUM CHLORIDE 0.9 % IV BOLUS
1000.0000 mL | Freq: Once | INTRAVENOUS | Status: AC
  Administered 2017-09-12: 1000 mL via INTRAVENOUS

## 2017-09-12 NOTE — ED Triage Notes (Signed)
Pt. Here for Left flank pain

## 2017-09-12 NOTE — ED Provider Notes (Signed)
[facility] Emergency Department Provider Note  ____________________________________________   I have reviewed the triage vital signs and the nursing notes.   HISTORY  Chief Complaint Flank Pain   History limited by: Not Limited   HPI [name] is a 67 y.o. male who presents to the emergency department today because of concerns for left flank pain.  Patient states that the pain has been ongoing for weeks.  Had been seen in the emergency department last month and diagnosed with a ureteral stone in the right ureter.  Has subsequently seen urology and had lithotripsy performed.  Patient states that he is continued to have pain on the left flank.  He did run out of his narcotic pain medication today.  States he vomited once because of the pain today.  He denies any fevers.  Per medical record review patient has a history of recent urology procedure  Past Medical History:  Diagnosis Date  . Abnormal laboratory test    Sees Dr. Ma Hillock  . Acid reflux   . Allergy bee venom   mainly as a child  . Anxiety 12/08/1969  . Arthritis   . Asthma 12/08/1953   mainly as a child  . Chronic back pain   . Colon cancer ([place]) 11/1999   stage 3 s/p colon resection  . Depression   . H/O Clostridium difficile infection   . History of chemotherapy    5-FU pump/leukovorin  . History of kidney stones   . Hypothyroidism   . Insomnia   . Multiple myeloma ([place]) 12/2011   smoldering vs mgus  . Nephrolithiasis 08/28/2017  . Osteoporosis   . Seasonal allergies     Patient Active Problem List   Diagnosis Date Noted  . Ureteral calculus 08/28/2017  . Renal colic 35/32/9924  . Nephrolithiasis 08/28/2017  . History of colon cancer 08/19/2017  . OP (osteoporosis) 04/19/2017  . Abdominal spasms 01/16/2016  . Smoldering multiple myeloma (SMM) ([place]) 01/09/2016  . Arthritis 05/18/2015  . Hyperglycemia 02/16/2015  . Anxiety disorder 10/04/2014  . Allergic rhinitis 10/04/2014   . Acid reflux 10/04/2014  . Insomnia 09/06/2014    Past Surgical History:  Procedure Laterality Date  . BACK SURGERY    . COLON SURGERY  2001   resection, 2nd surgery for scar tissue removal  . COLONOSCOPY    . COLONOSCOPY WITH PROPOFOL N/A 03/26/2016   Procedure: COLONOSCOPY WITH PROPOFOL;  Surgeon: Lollie Sails, MD;  Location: [facility] ENDOSCOPY;  Service: Endoscopy;  Laterality: N/A;  . CYSTOSCOPY/URETEROSCOPY/HOLMIUM LASER/STENT PLACEMENT Right 09/03/2017   Procedure: CYSTOSCOPY/URETEROSCOPY/HOLMIUM LASER/STENT PLACEMENT;  Surgeon: Abbie Sons, MD;  Location: [facility] ORS;  Service: Urology;  Laterality: Right;  . ESOPHAGOGASTRODUODENOSCOPY  10/2010  . EYE SURGERY  1994 and 2010   RKA in 65 and [place] in 2010  . HERNIA REPAIR  1970   lower left  . LUMBAR FUSION     L4-L5, L5-S1  . SACROILIAC JOINT FUSION    . SHOULDER SURGERY Left   . SPINAL CORD STIMULATOR IMPLANT      Prior to Admission medications   Medication Sig Start Date End Date Taking? Authorizing Provider  celecoxib (CELEBREX) 200 MG capsule Take 1 capsule (200 mg total) by mouth daily. 08/19/17   Poulose, Bethel Born, NP  Cholecalciferol (VITAMIN D) 2000 UNITS CAPS Take 1 capsule by mouth daily.    [provider]  CREATINE PO Take 4 capsules by mouth daily.     [provider]  dicyclomine (  BENTYL) 10 MG capsule Take 1 capsule (10 mg total) by mouth 2 (two) times daily. 07/12/17   Poulose, Bethel Born, NP  esomeprazole (NEXIUM 24HR) 20 MG capsule Take 20 mg by mouth daily.     [provider]  Multiple Vitamins-Minerals (CENTRUM SILVER ULTRA MENS) TABS Take 1 tablet by mouth daily.    [provider]  ondansetron (ZOFRAN ODT) 4 MG disintegrating tablet Take 1 tablet (4 mg total) by mouth every 8 (eight) hours as needed for nausea or vomiting. 08/23/17   Earleen Newport, MD  oxybutynin (DITROPAN) 5 MG tablet 1 tab tid prn frequency,urgency, bladder spasm 09/03/17   Stoioff, Ronda Fairly, MD  oxyCODONE-acetaminophen (PERCOCET) 7.5-325 MG tablet Take 1 tablet by mouth every 4 (four) hours as needed for severe pain. 09/08/17   Stoioff, Ronda Fairly, MD  tamsulosin (FLOMAX) 0.4 MG CAPS capsule Take 1 capsule (0.4 mg total) by mouth daily after supper. 08/26/17   Hubbard Hartshorn, FNP  traZODone (DESYREL) 100 MG tablet Take 1 tablet (100 mg total) by mouth at bedtime as needed. for sleep 08/19/17   Fredderick Severance, NP    Allergies Bee venom  Family History  Problem Relation Age of Onset  . Lymphoma Mother   . Arthritis Mother        deceased  . Cancer Mother   . Lung cancer Father   . Alcohol abuse Father        deceased  . Cancer Father   . Heart disease Unknown        grandparents  . Hyperlipidemia Sister   . Diabetes Brother   . Diabetes Son   . Alcohol abuse Brother   . Arthritis Brother   . Depression Brother   . Diabetes Brother   . Alcohol abuse Sister   . Arthritis Sister   . Depression Sister   . Alcohol abuse Son   . Early death Son        truck accident 76 at age 73    Social History Social History   Tobacco Use  . Smoking status: Former Smoker    Packs/day: 1.00    Years: 10.00    Pack years: 10.00    Types: Cigarettes    Last attempt to quit: 03/06/1970    Years since quitting: 47.5  . Smokeless tobacco: Never Used  . Tobacco comment: started at age 74 and quit at age 54  Substance Use Topics  . Alcohol use: No    Frequency: Never  . Drug use: No    Review of Systems Constitutional: No fever/chills Eyes: No visual changes. ENT: No sore throat. Cardiovascular: Denies chest pain. Respiratory: Denies shortness of breath. Gastrointestinal: Positive for left flank pain. Genitourinary: Negative for dysuria. Musculoskeletal: Negative for back pain. Skin: Negative for rash. Neurological: Negative for headaches, focal weakness or numbness.  ____________________________________________   PHYSICAL EXAM:  VITAL SIGNS: ED Triage  Vitals  Enc Vitals Group     BP 09/12/17 1936 (!) 146/95     Pulse Rate 09/12/17 1936 66     Resp 09/12/17 1936 18     Temp 09/12/17 1936 (!) 97.5 F (36.4 C)     Temp Source 09/12/17 1936 Oral     SpO2 09/12/17 1936 99 %     Weight 09/12/17 1937 130 lb (59 kg)     Height 09/12/17 1937 5' 7" (1.702 m)     Head Circumference --      Peak Flow --  Pain Score 09/12/17 1937 10   Constitutional: Alert and oriented.  Eyes: Conjunctivae are normal.  ENT      Head: Normocephalic and atraumatic.      Nose: No congestion/rhinnorhea.      Mouth/Throat: Mucous membranes are moist.      Neck: No stridor. Hematological/Lymphatic/Immunilogical: No cervical lymphadenopathy. Cardiovascular: Normal rate, regular rhythm.  No murmurs, rubs, or gallops.  Respiratory: Normal respiratory effort without tachypnea nor retractions. Breath sounds are clear and equal bilaterally. No wheezes/rales/rhonchi. Gastrointestinal: Soft and non tender. No rebound. No guarding.  Genitourinary: Deferred Musculoskeletal: Normal range of motion in all extremities. No lower extremity edema. Neurologic:  Normal speech and language. No gross focal neurologic deficits are appreciated.  Skin: Erythema and abrasions to the left mid back Psychiatric: Mood and affect are normal. Speech and behavior are normal. Patient exhibits appropriate insight and judgment.  ____________________________________________    LABS (pertinent positives/negatives)  CBC wnl BMP wnl except glu 127 UA not consistent with infection  ____________________________________________   EKG  None  ____________________________________________    RADIOLOGY  CT renal  Punctate stone at right uvj, no left sided stone  ____________________________________________   PROCEDURES  Procedures  ____________________________________________   INITIAL IMPRESSION / ASSESSMENT AND PLAN / ED COURSE  Pertinent labs & imaging results that were  available during my care of the patient were reviewed by me and considered in my medical decision making (see chart for details).   Patient presented to the emergency department today because of concerns for left flank pain.  CT scan shows perhaps a small punctate stone in the right UVJ.  Patient however just had procedure done by urology.  Additionally pain is on the left side.  On exam he had a abrasion and erythema to the left mid back.  He states that this is when he fell and scraped it against a table.  Certainly does not appear vesicular in nature.  This point unclear etiology.  Patient did feel better after IV Haldol.  Discussed with patient importance of follow-up.   ____________________________________________   FINAL CLINICAL IMPRESSION(S) / ED DIAGNOSES  Final diagnoses:  Left flank pain     Note: This dictation was prepared with Dragon dictation. Any transcriptional errors that result from this process are unintentional     Nance Pear, MD 09/12/17 2320

## 2017-09-12 NOTE — Discharge Instructions (Addendum)
Please seek medical attention for any high fevers, chest pain, shortness of breath, change in behavior, persistent vomiting, bloody stool or any other new or concerning symptoms.  

## 2017-09-13 ENCOUNTER — Telehealth: Payer: Self-pay | Admitting: Urology

## 2017-09-13 NOTE — Telephone Encounter (Signed)
Pt went to ER last evening by ambulance due to pain and wasn't able to ambulate.  Pt wife calls office this morning asking if someone could review pt labs done in ER last night and advise if pt needs to be seen by stoioff soon. ER physician told pt everything was and looked fine. Pt and wife asking for 2nd opinion. Please advise. [phone]

## 2017-09-16 NOTE — Progress Notes (Signed)
Indications: Patient is 67 y.o., male status post ureteroscopic removal of a 7 mm right proximal ureteral calculus and multiple right renal calculi on 09/03/2017.  The patient is presenting today for stent removal.  Procedure:  Flexible Cystoscopy with stent removal (89211)  Timeout was performed and the correct patient, procedure and participants were identified.    Description:  The patient was prepped and draped in the usual sterile fashion. Flexible cystosopy was performed.  The stent was visualized, grasped, and removed intact without difficulty. The patient tolerated the procedure well.  A single dose of oral antibiotics was given.  Complications:  None  Plan: He was instructed to contact the office or proceed to the emergency department for development of right flank pain after stent removal.  A metabolic evaluation was recommended.  Follow-up 3 months with a KUB.

## 2017-09-16 NOTE — Telephone Encounter (Signed)
Patient notified and voiced understanding. He is not having pain now.

## 2017-09-16 NOTE — Telephone Encounter (Signed)
CT was reviewed.  I do not see a reason for him to be having left flank pain.  His left renal calculi are nonobstructing.

## 2017-09-23 ENCOUNTER — Ambulatory Visit: Payer: Medicare Other | Admitting: Family Medicine

## 2017-10-18 ENCOUNTER — Other Ambulatory Visit: Payer: Self-pay | Admitting: Family Medicine

## 2017-10-18 DIAGNOSIS — K219 Gastro-esophageal reflux disease without esophagitis: Secondary | ICD-10-CM

## 2017-10-27 ENCOUNTER — Other Ambulatory Visit: Payer: Self-pay | Admitting: Nurse Practitioner

## 2017-10-27 ENCOUNTER — Other Ambulatory Visit: Payer: Self-pay | Admitting: Family Medicine

## 2017-10-27 DIAGNOSIS — K219 Gastro-esophageal reflux disease without esophagitis: Secondary | ICD-10-CM

## 2017-10-27 DIAGNOSIS — R109 Unspecified abdominal pain: Secondary | ICD-10-CM

## 2017-10-28 MED ORDER — OMEPRAZOLE 40 MG PO CPDR: 40.0000 mg | DELAYED_RELEASE_CAPSULE | Freq: Every day | ORAL | 1 refills | Status: DC

## 2017-10-28 NOTE — Telephone Encounter (Signed)
Pt is scheduled for Oct 17th and is also asking for Omeprazole to be filled. Please advise.

## 2017-10-28 NOTE — Telephone Encounter (Signed)
Re-started omeprazole - it was taken off of his active med list by Dr. Bernardo Heater, will restart and will see him October 17. Thanks!

## 2017-10-28 NOTE — Telephone Encounter (Signed)
Please schedule [name] for follow up around October 15-20th 2019

## 2017-10-29 ENCOUNTER — Telehealth: Payer: Self-pay | Admitting: Family Medicine

## 2017-10-29 DIAGNOSIS — R109 Unspecified abdominal pain: Secondary | ICD-10-CM

## 2017-10-29 NOTE — Telephone Encounter (Signed)
Please advise 

## 2017-10-29 NOTE — Telephone Encounter (Signed)
Copied from [place] [phone]. Topic: General - Other >> Oct 29, 2017 10:58 AM Marin Olp L wrote: Reason for CRM: [employer] calling to notify that dicyclomine hcl 10mg  is denied due to "not being prescribed by FDA or medically accepted reason".

## 2017-10-29 NOTE — Telephone Encounter (Signed)
Pt has been taking this medication in an ongoing fashion for several years.  Please find out why this has been denied now.  This has been one of the only medications that really works for him for his abdominal cramping and spasms.

## 2017-11-21 ENCOUNTER — Ambulatory Visit: Payer: Medicare Other | Admitting: Family Medicine

## 2017-11-25 ENCOUNTER — Encounter: Payer: Self-pay | Admitting: Family Medicine

## 2017-11-25 ENCOUNTER — Ambulatory Visit (INDEPENDENT_AMBULATORY_CARE_PROVIDER_SITE_OTHER): Payer: Medicare Other | Admitting: Family Medicine

## 2017-11-25 VITALS — BP 130/80 | HR 77 | Temp 98.1°F | Resp 16 | Ht 67.0 in | Wt 132.4 lb

## 2017-11-25 DIAGNOSIS — G4700 Insomnia, unspecified: Secondary | ICD-10-CM

## 2017-11-25 DIAGNOSIS — N3001 Acute cystitis with hematuria: Secondary | ICD-10-CM

## 2017-11-25 DIAGNOSIS — R109 Unspecified abdominal pain: Secondary | ICD-10-CM

## 2017-11-25 DIAGNOSIS — N200 Calculus of kidney: Secondary | ICD-10-CM

## 2017-11-25 DIAGNOSIS — R5383 Other fatigue: Secondary | ICD-10-CM

## 2017-11-25 DIAGNOSIS — F324 Major depressive disorder, single episode, in partial remission: Secondary | ICD-10-CM | POA: Diagnosis not present

## 2017-11-25 DIAGNOSIS — R509 Fever, unspecified: Secondary | ICD-10-CM | POA: Diagnosis not present

## 2017-11-25 DIAGNOSIS — K219 Gastro-esophageal reflux disease without esophagitis: Secondary | ICD-10-CM

## 2017-11-25 DIAGNOSIS — Z85038 Personal history of other malignant neoplasm of large intestine: Secondary | ICD-10-CM

## 2017-11-25 DIAGNOSIS — D472 Monoclonal gammopathy: Secondary | ICD-10-CM

## 2017-11-25 DIAGNOSIS — R300 Dysuria: Secondary | ICD-10-CM

## 2017-11-25 DIAGNOSIS — C9000 Multiple myeloma not having achieved remission: Secondary | ICD-10-CM | POA: Diagnosis not present

## 2017-11-25 DIAGNOSIS — F411 Generalized anxiety disorder: Secondary | ICD-10-CM | POA: Diagnosis not present

## 2017-11-25 LAB — POCT URINALYSIS DIPSTICK
Bilirubin, UA: NEGATIVE
Glucose, UA: NEGATIVE
KETONES UA: NEGATIVE
NITRITE UA: POSITIVE
PH UA: 6 (ref 5.0–8.0)
PROTEIN UA: POSITIVE — AB
SPEC GRAV UA: 1.01 (ref 1.010–1.025)
UROBILINOGEN UA: NEGATIVE U/dL — AB

## 2017-11-25 MED ORDER — SULFAMETHOXAZOLE-TRIMETHOPRIM 800-160 MG PO TABS: 1.0000 | ORAL_TABLET | Freq: Two times a day (BID) | ORAL | 0 refills | Status: AC

## 2017-11-25 MED ORDER — BUSPIRONE HCL 5 MG PO TABS: 5.0000 mg | ORAL_TABLET | Freq: Two times a day (BID) | ORAL | 0 refills | Status: DC | PRN

## 2017-11-25 NOTE — Telephone Encounter (Signed)
Patient stated he is getting medication through [facility]

## 2017-11-25 NOTE — Progress Notes (Signed)
Name: [name]   MRN: [id number]    DOB: Mar 18, 1950   Date:11/25/2017       Progress Note  Subjective  Chief Complaint  Chief Complaint  Patient presents with  . Generalized Body Aches    fever 100, night sweats, chills no appetite for 4 days    HPI  PT presents for follow up and concern for the following:  Body Aches/Fatigue/Fever: Last Thursday 11/21/2017 felt like he had a fever and felt bad; the next day he was having fever of 100.69F - took tylenol and is did not help.  Also had night sweats/chills x3-4 days (has now gone away).  Endorses decreased appetite, body aches, fatigue.  He endorses decreased urinary flow and occasional dysuria. Has significant history of nephrolithiasis. Denies ear pain/pressure, has chronic nasal congestion, no chest pain or shortness of breath, no abdominal pain that is abnormal for him.  Nephrolithiasis - seeing Dr. Bernardo Heater, had lithotripsy on right, needs left side done again.  Has follow up scheduled December 12, 2017.  GERD: takes esomeprazole 21m daily; food triggers- spicy foods but still eats them. Take a pepcid before bed every night.  We have discussed risks of long-term risk of PPI's.  He has not seen a GI specialist is over 5 years - we will refer today  History Colon Cancer: 18 years since his resection/surgery. States has chronic abdominal spasms treated with bentyl from colon resection.  He was recommended to have colonoscopy follow up every 3 years - had last colonoscopy in 2018. He has not seen a GI specialist is over 5 years - we will refer today  Smoldering Multiple Myeloma: Dr. BRogue Bussinglast seen on 07/29/17- no clinical evidence of progression; no meds on surveillance   MDD & Anxiety: was on klonopin in the past; trialed on lexapro and caused GI upset. States has dealt with depression and anxiety for a long time- has lost children in the past and it has been hard for him to deal with it. States has just been trying to find a  way to cope but doesn't feel like it is well- managed at all.  Discussed PRN medication - he is willing to try buspar for his anxiety.  Insomnia: takes trazadone 106mevery evening- average 8 hours of sleep a night; medicines help him get to sleep faster and sleep longer.   [facility] 11/25/2017 08/23/2017 08/19/2017 03/22/2017 02/19/2017  Decreased Interest 0 0 1 0 0  Down, Depressed, Hopeless 0 0 2 0 1  PHQ - 2 Score 0 0 3 0 1  Altered sleeping 1 1 3  - -  Tired, decreased energy 0 1 2 - -  Change in appetite 0 1 1 - -  Feeling bad or failure about yourself  0 - 0 - -  Trouble concentrating 0 0 0 - -  Moving slowly or fidgety/restless 0 0 2 - -  Suicidal thoughts 0 0 0 - -  PHQ-9 Score 1 3 11  - -  Difficult doing work/chores Not difficult at all Not difficult at all Not difficult at all - -    Patient Active Problem List   Diagnosis Date Noted  . Ureteral calculus 08/28/2017  . Renal colic 0773/71/0626. Nephrolithiasis 08/28/2017  . History of colon cancer 08/19/2017  . OP (osteoporosis) 04/19/2017  . Abdominal spasms 01/16/2016  . [place]  . Arthritis 05/18/2015  . Hyperglycemia 02/16/2015  . Anxiety disorder 10/04/2014  .  Allergic rhinitis 10/04/2014  . Acid reflux 10/04/2014  . Insomnia 09/06/2014    Social History   Tobacco Use  . Smoking status: Former Smoker    Packs/day: 1.00    Years: 10.00    Pack years: 10.00    Types: Cigarettes    Last attempt to quit: 03/06/1970    Years since quitting: 47.7  . Smokeless tobacco: Never Used  . Tobacco comment: started at age 28 and quit at age 59  Substance Use Topics  . Alcohol use: No    Frequency: Never     Current Outpatient Medications:  .  celecoxib (CELEBREX) 200 MG capsule, Take 1 capsule (200 mg total) by mouth daily., Disp: 90 capsule, Rfl: 1 .  Cholecalciferol (VITAMIN D) 2000 UNITS CAPS, Take 1 capsule by mouth daily., Disp: , Rfl:  .  dicyclomine  (BENTYL) 10 MG capsule, TAKE 1 CAPSULE BY MOUTH TWICE DAILY, Disp: 180 capsule, Rfl: 0 .  esomeprazole (NEXIUM 24HR) 20 MG capsule, Take 20 mg by mouth daily. , Disp: , Rfl:  .  Multiple Vitamins-Minerals (CENTRUM SILVER ULTRA MENS) TABS, Take 1 tablet by mouth daily., Disp: , Rfl:  .  traZODone (DESYREL) 100 MG tablet, Take 1 tablet (100 mg total) by mouth at bedtime as needed. for sleep, Disp: 90 tablet, Rfl: 1 .  busPIRone (BUSPAR) 5 MG tablet, Take 1 tablet (5 mg total) by mouth 2 (two) times daily as needed. Separate trazodone by at least 8 hours., Disp: 30 tablet, Rfl: 0 .  ondansetron (ZOFRAN ODT) 4 MG disintegrating tablet, Take 1 tablet (4 mg total) by mouth every 8 (eight) hours as needed for nausea or vomiting. (Patient not taking: Reported on 09/12/2017), Disp: 20 tablet, Rfl: 0 .  oxybutynin (DITROPAN) 5 MG tablet, 1 tab tid prn frequency,urgency, bladder spasm (Patient not taking: Reported on 09/12/2017), Disp: 30 tablet, Rfl: 0 .  oxyCODONE-acetaminophen (PERCOCET) 7.5-325 MG tablet, Take 1 tablet by mouth every 4 (four) hours as needed for severe pain. (Patient not taking: Reported on 09/12/2017), Disp: 15 tablet, Rfl: 0 .  sulfamethoxazole-trimethoprim (BACTRIM DS,SEPTRA DS) 800-160 MG tablet, Take 1 tablet by mouth 2 (two) times daily for 10 days., Disp: 20 tablet, Rfl: 0 .  tamsulosin (FLOMAX) 0.4 MG CAPS capsule, Take 1 capsule (0.4 mg total) by mouth daily after supper. (Patient not taking: Reported on 09/12/2017), Disp: 10 capsule, Rfl: 0  Allergies  Allergen Reactions  . Bee Venom Swelling    Bees Bees    I personally reviewed active problem list, medication list, allergies, lab results with the patient/caregiver today.  ROS  Ten systems reviewed and is negative except as mentioned in HPI.  Objective  Vitals:   11/25/17 1317  BP: 130/80  Pulse: 77  Resp: 16  Temp: 98.1 F (36.7 C)  TempSrc: Oral  SpO2: 99%  Weight: 132 lb 6.4 oz (60.1 kg)  Height: 5' 7"  (1.702 m)    Body mass index is 20.74 kg/m.  Nursing Note and Vital Signs reviewed.  Physical Exam  Constitutional: Patient appears well-developed and well-nourished. No distress.  HENT: Head: Normocephalic and atraumatic. Ears: bilateral TMs with no erythema or effusion; Nose: Nose normal. Mouth/Throat: Oropharynx is clear and moist. No oropharyngeal exudate or tonsillar swelling.  Eyes: Conjunctivae and EOM are normal. No scleral icterus.  Pupils are equal, round, and reactive to light.  Neck: Normal range of motion. Neck supple. No JVD present. No thyromegaly present.  Cardiovascular: Normal rate, regular rhythm and normal heart  sounds.  No murmur heard. No BLE edema. Pulmonary/Chest: Effort normal and breath sounds normal. No respiratory distress. Abdominal: Soft. Bowel sounds are normal, no distension. There is no tenderness. No masses. No CVA or flank tenderness Musculoskeletal: Normal range of motion, no joint effusions. No gross deformities Neurological: Pt is alert and oriented to person, place, and time. No cranial nerve deficit. Coordination, balance, strength, speech and gait are normal.  Skin: Skin is warm and dry. No rash noted. No erythema.  Psychiatric: Patient has a normal mood and affect. behavior is normal. Judgment and thought content normal.   Results for orders placed or performed in visit on 11/25/17 (from the past 72 hour(s))  POCT urinalysis dipstick     Status: Abnormal   Collection Time: 11/25/17  2:01 PM  Result Value Ref Range   Color, UA yellow    Clarity, UA murky    Glucose, UA Negative Negative   Bilirubin, UA negative    Ketones, UA negative    Spec Grav, UA 1.010 1.010 - 1.025   Blood, UA large    pH, UA 6.0 5.0 - 8.0   Protein, UA Positive (A) Negative   Urobilinogen, UA negative (A) 0.2 or 1.0 E.U./dL   Nitrite, UA positive    Leukocytes, UA Large (3+) (A) Negative   Appearance     Odor      Assessment & Plan  1. Gastroesophageal reflux disease  without esophagitis - Ambulatory referral to Gastroenterology  2. Abdominal spasms - Ambulatory referral to Gastroenterology  3. History of colon cancer - Ambulatory referral to Gastroenterology  4. Smoldering multiple myeloma (SMM) (HCC) - continue follow up with Dr. Rogue Bussing.  5. Generalized anxiety disorder - busPIRone (BUSPAR) 5 MG tablet; Take 1 tablet (5 mg total) by mouth 2 (two) times daily as needed. Separate trazodone by at least 8 hours.  Dispense: 30 tablet; Refill: 0  6. Insomnia, unspecified type - Continue trazodone.  7. Nephrolithiasis - Urine Culture - BASIC METABOLIC PANEL WITH GFR - POCT urinalysis dipstick - Ambulatory referral to Urology  8. Major depressive disorder in partial remission, unspecified whether recurrent (HCC) - busPIRone (BUSPAR) 5 MG tablet; Take 1 tablet (5 mg total) by mouth 2 (two) times daily as needed. Separate trazodone by at least 8 hours.  Dispense: 30 tablet; Refill: 0  9. Dysuria - Urine Culture - CBC w/Diff/Platelet - BASIC METABOLIC PANEL WITH GFR - POCT urinalysis dipstick  10. Fatigue, unspecified type - Urine Culture - CBC w/Diff/Platelet - BASIC METABOLIC PANEL WITH GFR - POCT urinalysis dipstick  11. Fever, unspecified fever cause - Urine Culture - CBC w/Diff/Platelet - BASIC METABOLIC PANEL WITH GFR - POCT urinalysis dipstick  12. Acute cystitis with hematuria - Upon receipt of UA results, I am concerned for cystitis - we will treat with bactrim, and referral to urology is made to ensure he has close follow up with Dr. Dene Gentry office. Pt to also call his office to schedule upon leaving our office. - sulfamethoxazole-trimethoprim (BACTRIM DS,SEPTRA DS) 800-160 MG tablet; Take 1 tablet by mouth 2 (two) times daily for 10 days.  Dispense: 20 tablet; Refill: 0 - Ambulatory referral to Urology  -Red flags and when to present for emergency care or RTC including fever >101.17F, chest pain, shortness of breath,  new/worsening/un-resolving symptoms, back/flank pain, frank hematuria reviewed with patient at time of visit. Follow up and care instructions discussed and provided in AVS.

## 2017-11-26 ENCOUNTER — Telehealth: Payer: Self-pay | Admitting: Urology

## 2017-11-26 NOTE — Telephone Encounter (Signed)
Called to offer patient an app with [facility] since we did not have anything with [facility], he stated that he called the office yesterday and was told we could not see him due to availability with anybody. Per [facility] ok for him to continue on the ABX he was given by  His PCP and keep his F/up app on the 7th. I advised that if things changed and he wanted to be seen sooner to call and we could get him in with [facility].   Sharyn Lull

## 2017-11-27 LAB — CBC WITH DIFFERENTIAL/PLATELET
BASOS PCT: 0.4 %
Basophils Absolute: 31 cells/uL (ref 0–200)
Eosinophils Absolute: 179 cells/uL (ref 15–500)
Eosinophils Relative: 2.3 %
HEMATOCRIT: 40.4 % (ref 38.5–50.0)
HEMOGLOBIN: 14.2 g/dL (ref 13.2–17.1)
LYMPHS ABS: 1451 {cells}/uL (ref 850–3900)
MCH: 32.7 pg (ref 27.0–33.0)
MCHC: 35.1 g/dL (ref 32.0–36.0)
MCV: 93.1 fL (ref 80.0–100.0)
MPV: 9.7 fL (ref 7.5–12.5)
Monocytes Relative: 10.9 %
NEUTROS ABS: 5288 {cells}/uL (ref 1500–7800)
Neutrophils Relative %: 67.8 %
Platelets: 335 10*3/uL (ref 140–400)
RBC: 4.34 10*6/uL (ref 4.20–5.80)
RDW: 11.7 % (ref 11.0–15.0)
Total Lymphocyte: 18.6 %
WBC mixed population: 850 cells/uL (ref 200–950)
WBC: 7.8 10*3/uL (ref 3.8–10.8)

## 2017-11-27 LAB — BASIC METABOLIC PANEL WITH GFR
BUN/Creatinine Ratio: 16 (calc) (ref 6–22)
BUN: 20 mg/dL (ref 7–25)
CALCIUM: 9.5 mg/dL (ref 8.6–10.3)
CHLORIDE: 98 mmol/L (ref 98–110)
CO2: 26 mmol/L (ref 20–32)
Creat: 1.27 mg/dL — ABNORMAL HIGH (ref 0.70–1.25)
GFR, EST AFRICAN AMERICAN: 67 mL/min/{1.73_m2} (ref >=60)
GFR, Est Non African American: 58 mL/min/{1.73_m2} — ABNORMAL LOW (ref >=60)
Glucose, Bld: 88 mg/dL (ref 65–139)
POTASSIUM: 4.2 mmol/L (ref 3.5–5.3)
Sodium: 134 mmol/L — ABNORMAL LOW (ref 135–146)

## 2017-11-27 LAB — URINE CULTURE
MICRO NUMBER: 91263558
SPECIMEN QUALITY:: ADEQUATE

## 2017-11-28 ENCOUNTER — Ambulatory Visit: Payer: Medicare Other | Admitting: Urology

## 2017-12-12 ENCOUNTER — Encounter: Payer: Self-pay | Admitting: Urology

## 2017-12-12 ENCOUNTER — Ambulatory Visit (INDEPENDENT_AMBULATORY_CARE_PROVIDER_SITE_OTHER): Payer: Medicare Other | Admitting: Urology

## 2017-12-12 ENCOUNTER — Ambulatory Visit
Admission: RE | Admit: 2017-12-12 | Discharge: 2017-12-12 | Disposition: A | Discharge status: Home / Self Care | Payer: Medicare Other | Source: Ambulatory Visit | Attending: Urology | Admitting: Urology

## 2017-12-12 ENCOUNTER — Other Ambulatory Visit: Payer: Self-pay | Admitting: Urology

## 2017-12-12 VITALS — BP 124/76 | HR 80 | Ht 67.0 in | Wt 134.5 lb

## 2017-12-12 DIAGNOSIS — N200 Calculus of kidney: Secondary | ICD-10-CM | POA: Insufficient documentation

## 2017-12-12 DIAGNOSIS — R8281 Pyuria: Secondary | ICD-10-CM

## 2017-12-12 DIAGNOSIS — R300 Dysuria: Secondary | ICD-10-CM | POA: Diagnosis not present

## 2017-12-12 LAB — URINALYSIS, COMPLETE
BILIRUBIN UA: NEGATIVE
GLUCOSE, UA: NEGATIVE
Ketones, UA: NEGATIVE
Nitrite, UA: NEGATIVE
PROTEIN UA: NEGATIVE
Specific Gravity, UA: >1.03 — ABNORMAL HIGH (ref 1.005–1.030)
UUROB: 0.2 mg/dL (ref 0.2–1.0)
pH, UA: 5.5 (ref 5.0–7.5)

## 2017-12-12 LAB — MICROSCOPIC EXAMINATION: Epithelial Cells (non renal): NONE SEEN /hpf (ref 0–10)

## 2017-12-12 NOTE — Progress Notes (Signed)
12/12/2017 2:46 PM   [name] November 15, 1950 [id number]  Referring provider: Hubbard Hartshorn, [place], [place] [address]  Chief Complaint  Patient presents with  . Nephrolithiasis    HPI: 67 year old male presents for follow-up of nephrolithiasis.  He underwent ureteroscopic removal of a 7 mm right proximal ureteral calculus in July 2019.  He had no issues after stent removal in early August.  His CT did show bilateral nephrolithiasis.  He was seen in the ED in early August complaining of left flank pain.  A CT was repeated which showed no left hydronephrosis or obstructing left ureteral calculi.  There was a small 1 mm fragment in the right distal ureter with mild hydroureter.  His pain resolved.  He did not do a metabolic evaluation.  KUB performed today shows a large amount of obscuring stool and bowel gas over the renal outlines.  The left upper pole renal calculus is visualized.   He was seen by his PCP in late October 2019 and was diagnosed with a UTI with urine culture growing E. coli.  His symptoms resolved on antibiotic therapy.   PMH: Past Medical History:  Diagnosis Date  . Abnormal laboratory test    Sees Dr. Ma Hillock  . Acid reflux   . Allergy bee venom   mainly as a child  . Anxiety 12/08/1969  . Arthritis   . Asthma 12/08/1953   mainly as a child  . Chronic back pain   . Colon cancer ([place]) 11/1999   stage 3 s/p colon resection  . Depression   . H/O Clostridium difficile infection   . History of chemotherapy    5-FU pump/leukovorin  . History of kidney stones   . Hypothyroidism   . Insomnia   . Multiple myeloma ([place]) 12/2011   smoldering vs mgus  . Nephrolithiasis 08/28/2017  . Osteoporosis   . Seasonal allergies     Surgical History: Past Surgical History:  Procedure Laterality Date  . BACK SURGERY    . COLON SURGERY  2001   resection, 2nd surgery for scar tissue removal  . COLONOSCOPY    . COLONOSCOPY WITH PROPOFOL  N/A 03/26/2016   Procedure: COLONOSCOPY WITH PROPOFOL;  Surgeon: Lollie Sails, MD;  [facility] END[facility]COPY;  Service: Endoscopy;  Laterality: N/A;  . CYSTOSCOPY/URETEROSCOPY/HOLMIUM LASER/STENT PLACEMENT Right 09/03/2017   Procedure: CYSTOSCOPY/URETEROSCOPY/HOLMIUM LASER/STENT PLACEMENT;  Surgeon: Abbie Sons, MD;  Location: [facility] ORS;  Service: Urology;  Laterality: Right;  . ESOPHAGOGASTRODUODENOSCOPY  10/2010  . EYE SURGERY  1994 and 2010   RKA in 57 and [place] in 2010  . HERNIA REPAIR  1970   lower left  . LUMBAR FUSION     L4-L5, L5-S1  . SACROILIAC JOINT FUSION    . SHOULDER SURGERY Left   . SPINAL CORD STIMULATOR IMPLANT      Home Medications:  Allergies as of 12/12/2017      Reactions   Bee Venom Swelling   Bees Bees      Medication List        Accurate as of 12/12/17  2:46 PM. Always use your most recent med list.          busPIRone 5 MG tablet Commonly known as:  BUSPAR Take 1 tablet (5 mg total) by mouth 2 (two) times daily as needed. Separate trazodone by at least 8 hours.   celecoxib 200 MG capsule Commonly known as:  CELEBREX Take 1 capsule (200 mg total) by  mouth daily.   CENTRUM SILVER ULTRA MENS Tabs Take 1 tablet by mouth daily.   dicyclomine 10 MG capsule Commonly known as:  BENTYL TAKE 1 CAPSULE BY MOUTH TWICE DAILY   NEXIUM 24HR 20 MG capsule Generic drug:  esomeprazole Take 20 mg by mouth daily.   tamsulosin 0.4 MG Caps capsule Commonly known as:  FLOMAX Take 1 capsule (0.4 mg total) by mouth daily after supper.   traZODone 100 MG tablet Commonly known as:  DESYREL Take 1 tablet (100 mg total) by mouth at bedtime as needed. for sleep   Vitamin D 50 MCG (2000 UT) Caps Take 1 capsule by mouth daily.       Allergies:  Allergies  Allergen Reactions  . Bee Venom Swelling    Bees Bees    Family History: Family History  Problem Relation Age of Onset  . Lymphoma Mother   . Arthritis Mother        deceased  . Cancer  Mother   . Lung cancer Father   . Alcohol abuse Father        deceased  . Cancer Father   . Heart disease Unknown        grandparents  . Hyperlipidemia Sister   . Diabetes Brother   . Diabetes Son   . Alcohol abuse Brother   . Arthritis Brother   . Depression Brother   . Diabetes Brother   . Alcohol abuse Sister   . Arthritis Sister   . Depression Sister   . Alcohol abuse Son   . Early death Son        truck accident 79 at age 16    Social History:  reports that he quit smoking about 47 years ago. His smoking use included cigarettes. He has a 10.00 pack-year smoking history. He has never used smokeless tobacco. He reports that he does not drink alcohol or use drugs.  ROS: UROLOGY Frequent Urination?: Yes Hard to postpone urination?: No Burning/pain with urination?: No Get up at night to urinate?: Yes Leakage of urine?: Yes Urine stream starts and stops?: No Trouble starting stream?: No Do you have to strain to urinate?: No Blood in urine?: No Urinary tract infection?: No Sexually transmitted disease?: No Injury to kidneys or bladder?: No Painful intercourse?: No Weak stream?: No Erection problems?: No Penile pain?: No  Gastrointestinal Nausea?: No Vomiting?: No Indigestion/heartburn?: No Diarrhea?: No Constipation?: No  Constitutional Fever: No Night sweats?: Yes Weight loss?: No Fatigue?: No  Skin Skin rash/lesions?: No Itching?: No  Eyes Blurred vision?: No Double vision?: No  Ears/Nose/Throat Sore throat?: No Sinus problems?: No  Hematologic/Lymphatic Swollen glands?: No Easy bruising?: No  Cardiovascular Leg swelling?: No Chest pain?: No  Respiratory Cough?: No Shortness of breath?: No  Endocrine Excessive thirst?: No  Musculoskeletal Back pain?: Yes Joint pain?: Yes  Neurological Headaches?: No Dizziness?: No  Psychologic Depression?: Yes Anxiety?: Yes  Physical Exam: BP 124/76 (BP Location: Left Arm, Patient  Position: Sitting, Cuff Size: Normal)   Pulse 80   Ht _0  (1.702 m)   Wt 134 lb 8 oz (61 kg)   BMI 21.07 kg/m   Constitutional:  Alert and oriented, No acute distress. HEENT: [place] AT, moist mucus membranes.  Trachea midline, no masses. Cardiovascular: No clubbing, cyanosis, or edema. Respiratory: Normal respiratory effort, no increased work of breathing. GI: Abdomen is soft, nontender, nondistended, no abdominal masses GU: No CVA tenderness Lymph: No cervical or inguinal lymphadenopathy. Skin: No rashes, bruises or suspicious  lesions. Neurologic: Grossly intact, no focal deficits, moving all 4 extremities. Psychiatric: Normal mood and affect.  Laboratory Data:  Urinalysis Dipstick 1+ blood/1+ leukocytes Microscopy 6-10 WBC/3-10 RBC   Assessment & Plan:   67 year old male with a left nephrolithiasis which is stable.  He desires to continue surveillance.  Repeat urinalysis today shows pyuria and bacteriuria.  Urine culture was ordered and he will be notified with the results.  Follow-up 6 months with a KUB.  Abbie Sons, [place] [address], [place], [place] [address] [phone]

## 2017-12-13 ENCOUNTER — Encounter: Payer: Self-pay | Admitting: Urology

## 2017-12-15 LAB — CULTURE, URINE COMPREHENSIVE

## 2017-12-16 ENCOUNTER — Telehealth: Payer: Self-pay

## 2017-12-16 ENCOUNTER — Other Ambulatory Visit: Payer: Self-pay | Admitting: Urology

## 2017-12-16 DIAGNOSIS — R8281 Pyuria: Secondary | ICD-10-CM

## 2017-12-16 MED ORDER — SULFAMETHOXAZOLE-TRIMETHOPRIM 800-160 MG PO TABS: 1.0000 | ORAL_TABLET | Freq: Two times a day (BID) | ORAL | 0 refills | Status: AC

## 2017-12-16 NOTE — Telephone Encounter (Signed)
-----   Message from Abbie Sons, MD sent at 12/16/2017  8:44 AM EST ----- Urine culture still shows infection.  He may have had a prostate infection and needs a longer course of antibiotic therapy.  Additional Rx was sent to pharmacy.  Repeat urinalysis 1 month

## 2017-12-24 ENCOUNTER — Ambulatory Visit (INDEPENDENT_AMBULATORY_CARE_PROVIDER_SITE_OTHER): Payer: Medicare Other

## 2017-12-24 DIAGNOSIS — Z23 Encounter for immunization: Secondary | ICD-10-CM

## 2018-01-10 ENCOUNTER — Ambulatory Visit (INDEPENDENT_AMBULATORY_CARE_PROVIDER_SITE_OTHER): Payer: Medicare Other

## 2018-01-10 VITALS — BP 130/82 | HR 68 | Temp 97.6°F | Resp 16 | Ht 67.0 in | Wt 133.3 lb

## 2018-01-10 DIAGNOSIS — Z0000 Encounter for general adult medical examination without abnormal findings: Secondary | ICD-10-CM | POA: Diagnosis not present

## 2018-01-10 NOTE — Progress Notes (Signed)
Subjective:   [name] is a 67 y.o. male who presents for Medicare Annual/Subsequent preventive examination.  Review of Systems:   Cardiac Risk Factors include: advanced age (>53mn, >>64women);dyslipidemia;hypertension;male gender     Objective:    Vitals: BP 130/82 (BP Location: Left Arm, Patient Position: Sitting, Cuff Size: Normal)   Pulse 68   Temp 97.6 F (36.4 C) (Oral)   Resp 16   Ht _0  (1.702 m)   Wt 133 lb 4.8 oz (60.5 kg)   SpO2 99%   BMI 20.88 kg/m   Body mass index is 20.88 kg/m.  Advanced Directives 01/10/2018 09/12/2017 08/28/2017 08/23/2017 07/29/2017 01/09/2017 08/20/2016  Does Patient Have a Medical Advance Directive? _1  Yes No  Type of AParamedicof APainted HillsLiving will - HC[place]Living will [place] will Living will;Healthcare Power of [place] will -  Does patient want to make changes to medical advance directive? - No - Patient declined No - Patient declined - No - Patient declined - -  Copy of HCapitolain Chart? No - copy requested - No - copy requested No - copy requested Yes No - copy requested -  Would patient like information on creating a medical advance directive? - - No - Patient declined No - Patient declined - - -    Tobacco Social History   Tobacco Use  Smoking Status Former Smoker  . Packs/day: 1.00  . Years: 10.00  . Pack years: 10.00  . Types: Cigarettes  . Last attempt to quit: 03/06/1970  . Years since quitting: 47.8  Smokeless Tobacco Never Used  Tobacco Comment   started at age [age]and quit at age 67    Counseling given: Not Answered Comment: started at age [age]and quit at age 67  Clinical Intake:  Pre-visit preparation completed: Yes  Pain : 0-10 Pain Score: 5  Pain Type: Chronic pain Pain Location: Back Pain Descriptors / Indicators: Aching, Discomfort, Sore Pain Onset:  More than a month ago     Nutritional Status: BMI of 19-24  Normal Nutritional Risks: None Diabetes: No  How often do you need to have someone help you when you read instructions, pamphlets, or other written materials from your doctor or pharmacy?: 1 - Never What is the last grade level you completed in school?: 12th grade  Interpreter Needed?: No  Information entered by :: KClemetine MarkerLPN  Past Medical History:  Diagnosis Date  . Abnormal laboratory test    Sees Dr. PMa Hillock . Acid reflux   . Allergy bee venom   mainly as a child  . Anxiety 12/08/1969  . Arthritis   . Asthma 12/08/1953   mainly as a child  . Chronic back pain   . [place]   stage 3 s/p colon resection  . Depression   . H/O Clostridium difficile infection   . History of chemotherapy    5-FU pump/leukovorin  . History of kidney stones   . Hypothyroidism   . Insomnia   . [place]   smoldering vs mgus  . Nephrolithiasis 08/28/2017  . Osteoporosis   . Seasonal allergies    Past Surgical History:  Procedure Laterality Date  . BACK SURGERY    . COLON SURGERY  2001   resection, 2nd surgery for scar tissue removal  . COLONOSCOPY    . COLONOSCOPY WITH  PROPOFOL N/A 03/26/2016   Procedure: COLONOSCOPY WITH PROPOFOL;  Surgeon: Lollie Sails, MD;  Location: [facility] ENDOSCOPY;  Service: Endoscopy;  Laterality: N/A;  . CYSTOSCOPY/URETEROSCOPY/HOLMIUM LASER/STENT PLACEMENT Right 09/03/2017   Procedure: CYSTOSCOPY/URETEROSCOPY/HOLMIUM LASER/STENT PLACEMENT;  Surgeon: Abbie Sons, MD;  Location: [facility] ORS;  Service: Urology;  Laterality: Right;  . ESOPHAGOGASTRODUODENOSCOPY  10/2010  . EYE SURGERY  1994 and 2010   RKA in 59 and [place] in 2010  . HERNIA REPAIR  1970   lower left  . LUMBAR FUSION     L4-L5, L5-S1  . SACROILIAC JOINT FUSION    . SHOULDER SURGERY Left   . SPINAL CORD STIMULATOR IMPLANT     Family History  Problem Relation Age of Onset  . Lymphoma  Mother   . Arthritis Mother        deceased  . Cancer Mother   . Lung cancer Father   . Alcohol abuse Father        deceased  . Cancer Father   . Heart disease Unknown        grandparents  . Hyperlipidemia Sister   . Diabetes Brother   . Diabetes Son   . Alcohol abuse Brother   . Arthritis Brother   . Depression Brother   . Diabetes Brother   . Alcohol abuse Sister   . Arthritis Sister   . Depression Sister   . Alcohol abuse Son   . Early death Son        truck accident 66 at age 42   Social History   Socioeconomic History  . Marital status: Married    Spouse name: Not on file  . Number of children: 2  . Years of education: Not on file  . Highest education level: High school graduate  Occupational History  . Occupation: Retired  Scientific laboratory technician  . Financial resource strain: Not hard at all  . Food insecurity:    Worry: Never true    Inability: Never true  . Transportation needs:    Medical: No    Non-medical: No  Tobacco Use  . Smoking status: Former Smoker    Packs/day: 1.00    Years: 10.00    Pack years: 10.00    Types: Cigarettes    Last attempt to quit: 03/06/1970    Years since quitting: 47.8  . Smokeless tobacco: Never Used  . Tobacco comment: started at age 52 and quit at age 55  Substance and Sexual Activity  . Alcohol use: No    Frequency: Never  . Drug use: No  . Sexual activity: Yes    Birth control/protection: None  Lifestyle  . Physical activity:    Days per week: 4 days    Minutes per session: 60 min  . Stress: Not at all  Relationships  . Social connections:    Talks on phone: More than three times a week    Gets together: More than three times a week    Attends religious service: Never    Active member of club or organization: Yes    Attends meetings of clubs or organizations: Never    Relationship status: Married  Other Topics Concern  . Not on file  Social History Narrative   Pt is an avid golfer and bowls every week     Outpatient Encounter Medications as of 01/10/2018  Medication Sig  . celecoxib (CELEBREX) 200 MG capsule Take 1 capsule (200 mg total) by mouth daily.  . Cholecalciferol (VITAMIN D) 2000 UNITS  CAPS Take 1 capsule by mouth daily.  Marland Kitchen dicyclomine (BENTYL) 10 MG capsule TAKE 1 CAPSULE BY MOUTH TWICE DAILY  . esomeprazole (NEXIUM 24HR) 20 MG capsule Take 20 mg by mouth daily.   . Multiple Vitamins-Minerals (CENTRUM SILVER ULTRA MENS) TABS Take 1 tablet by mouth daily.  . traZODone (DESYREL) 100 MG tablet Take 1 tablet (100 mg total) by mouth at bedtime as needed. for sleep  . [DISCONTINUED] busPIRone (BUSPAR) 5 MG tablet Take 1 tablet (5 mg total) by mouth 2 (two) times daily as needed. Separate trazodone by at least 8 hours.  . [DISCONTINUED] tamsulosin (FLOMAX) 0.4 MG CAPS capsule Take 1 capsule (0.4 mg total) by mouth daily after supper. (Patient not taking: Reported on 09/12/2017)   No facility-administered encounter medications on file as of 01/10/2018.     Activities of Daily Living In your present state of health, do you have any difficulty performing the following activities: 01/10/2018 08/28/2017  Hearing? N Y  Comment wears hearing aids bilateral hearing aides  Vision? N N  Comment wears reading glasses -  Difficulty concentrating or making decisions? N N  Walking or climbing stairs? N N  Dressing or bathing? N N  Doing errands, shopping? N N  Preparing Food and eating ? N -  Using the Toilet? N -  In the past six months, have you accidently leaked urine? N -  Do you have problems with loss of bowel control? N -  Managing your Medications? N -  Managing your Finances? N -  Housekeeping or managing your Housekeeping? N -  Some recent data might be hidden    Patient Care Team: Hubbard Hartshorn, FNP as PCP - General (Family Medicine) Cammie Sickle, MD as Consulting Physician (Internal Medicine) Lollie Sails, MD as Consulting Physician (Gastroenterology)    Assessment:   This is a routine wellness examination for [name].  Exercise Activities and Dietary recommendations Current Exercise Habits: Home exercise routine(pt plays golf and bowls), Type of exercise: Other - see comments(pt plays golf and bowls), Time (Minutes): 60, Frequency (Times/Week): 4, Weekly Exercise (Minutes/Week): 240, Intensity: Moderate, Exercise limited by: None identified  Goals    . DIET - INCREASE WATER INTAKE     Recommend to drink at least 6-8 8oz glasses of water per day.    . Increase water intake     Starting 01/09/16, I will continue drinking 2 glasses of water a day.       Fall Risk Fall Risk  01/10/2018 11/25/2017 08/23/2017 08/19/2017 03/22/2017  Falls in the past year? 0 No No No No  Number falls in past yr: 0 - - - -   FALL RISK PREVENTION PERTAINING TO THE HOME:  Any stairs in or around the home WITH handrails? Yes  Home free of loose throw rugs in walkways, pet beds, electrical cords, etc? Yes  Adequate lighting in your home to reduce risk of falls? Yes   ASSISTIVE DEVICES UTILIZED TO PREVENT FALLS:  Life alert? No  Use of a cane, walker or w/c? No  Grab bars in the bathroom? No  Shower chair or bench in shower? No  Elevated toilet seat or a handicapped toilet? No   DME ORDERS:  DME order needed?  No   TIMED UP AND GO:  Was the test performed? Yes .  Length of time to ambulate 10 feet: 5 sec.   GAIT:  Appearance of gait: Gait stead-fast and without the use of an assistive device.  Education: Fall risk prevention has been discussed.  Intervention(s) required? No    Depression Screen PHQ 2/9 Scores 01/10/2018 11/25/2017 08/23/2017 08/19/2017  PHQ - 2 Score 3 0 0 3  PHQ- 9 Score _0 Cognitive Function     6CIT Screen 01/09/2016  What Year? 0 points  What month? 0 points  What time? 0 points  Count back from 20 0 points  Months in reverse 0 points  Repeat phrase 2 points  Total Score 2    Immunization History   Administered Date(s) Administered  . Influenza, High Dose Seasonal PF 11/04/2014, 01/26/2016, 11/07/2016, 12/24/2017  . Influenza-Unspecified 01/26/2016  . Pneumococcal Conjugate-13 01/09/2016  . Pneumococcal Polysaccharide-23 01/09/2017    Qualifies for Shingles Vaccine? Yes . Zostavax done in 2014 at [facility]. Due for Shingrix. Education has been provided regarding the importance of this vaccine. Pt has been advised to call insurance company to determine out of pocket expense. Advised may also receive vaccine at local pharmacy or Health Dept. Verbalized acceptance and understanding.  Tdap: Up to date  Flu Vaccine: Up to date  Pneumococcal Vaccine: Up to date   Screening Tests Health Maintenance  Topic Date Due  . TETANUS/TDAP  07/07/2022  . COLONOSCOPY  03/26/2026  . INFLUENZA VACCINE  Completed  . Hepatitis C Screening  Completed  . PNA vac Low Risk Adult  Completed   Cancer Screenings:  Colorectal Screening: Completed 03/26/16. Repeat every 10 years; pt prefers every 5 years due to hx of colon cancer.    Lung Cancer Screening: (Low Dose CT Chest recommended if Age 69-80 years, 30 pack-year currently smoking OR have quit w/in 15years.) does not qualify.   Additional Screening:  Hepatitis C Screening: does qualify; Completed 01/09/16  Vision Screening: Recommended annual ophthalmology exams for early detection of glaucoma and other disorders of the eye. Is the patient up to date with their annual eye exam?  No  Who is the provider or what is the name of the office in which the pt attends annual eye exams? Pt declines eye exams  Dental Screening: Recommended annual dental exams for proper oral hygiene  Community Resource Referral:  CRR required this visit?  No       Plan:    I have personally reviewed and addressed the Medicare Annual Wellness questionnaire and have noted the following in the patient's chart:  A. Medical and social history B. Use of alcohol, tobacco or  illicit drugs  C. Current medications and supplements D. Functional ability and status E.  Nutritional status F.  Physical activity G. Advance directives H. List of other physicians I.  Hospitalizations, surgeries, and ER visits in previous 12 months J.  [place] such as hearing and vision if needed, cognitive and depression L. Referrals and appointments   In addition, I have reviewed and discussed with patient certain preventive protocols, quality metrics, and best practice recommendations. A written personalized care plan for preventive services as well as general preventive health recommendations were provided to patient.   Signed,  [name], LPN Nurse Health Advisor   Nurse Notes: pt doing well. PHQ 9 score of 4 today due to feeling depressed around the holidays because he lost his son at age 13 in the early 59s in a car accident and his step son at age 50 in 29. He is still active bowling and golfing and managing depression well with trazodone at bedtime for sleep.

## 2018-01-10 NOTE — Patient Instructions (Signed)
[name] , Thank you for taking time to come for your Medicare Wellness Visit. I appreciate your ongoing commitment to your health goals. Please review the following plan we discussed and let me know if I can assist you in the future.   Screening recommendations/referrals: Colonoscopy: done 03/26/16 repeat in 5 years Recommended yearly ophthalmology/optometry visit for glaucoma screening and checkup Recommended yearly dental visit for hygiene and checkup  Vaccinations: Influenza vaccine: done 12/24/17 Pneumococcal vaccine: done 01/09/17 Tdap vaccine: done 07/06/12 Shingles vaccine: Shingrix discussed. Please contact your insurance company or pharmacy for coverage information.     Advanced directives: Please bring a copy of your health care power of attorney and living will to the office at your convenience.  Conditions/risks identified: Recommend increasing water intake to 6-8 glasses per day.   Next appointment: Please follow up in one year for your Medicare Annual Wellness visit.    Preventive Care 69 Years and Older, Male Preventive care refers to lifestyle choices and visits with your health care provider that can promote health and wellness. What does preventive care include?  A yearly physical exam. This is also called an annual well check.  Dental exams once or twice a year.  Routine eye exams. Ask your health care provider how often you should have your eyes checked.  Personal lifestyle choices, including:  Daily care of your teeth and gums.  Regular physical activity.  Eating a healthy diet.  Avoiding tobacco and drug use.  Limiting alcohol use.  Practicing safe sex.  Taking low doses of aspirin every day.  Taking vitamin and mineral supplements as recommended by your health care provider. What happens during an annual well check? The services and screenings done by your health care provider during your annual well check will depend on your age, overall health,  lifestyle risk factors, and family history of disease. Counseling  Your health care provider may ask you questions about your:  Alcohol use.  Tobacco use.  Drug use.  Emotional well-being.  Home and relationship well-being.  Sexual activity.  Eating habits.  History of falls.  Memory and ability to understand (cognition).  Work and work Statistician. Screening  You may have the following tests or measurements:  Height, weight, and BMI.  Blood pressure.  Lipid and cholesterol levels. These may be checked every 5 years, or more frequently if you are over 63 years old.  Skin check.  Lung cancer screening. You may have this screening every year starting at age 76 if you have a 30-pack-year history of smoking and currently smoke or have quit within the past 15 years.  Fecal occult blood test (FOBT) of the stool. You may have this test every year starting at age 38.  Flexible sigmoidoscopy or colonoscopy. You may have a sigmoidoscopy every 5 years or a colonoscopy every 10 years starting at age 10.  Prostate cancer screening. Recommendations will vary depending on your family history and other risks.  Hepatitis C blood test.  Hepatitis B blood test.  Sexually transmitted disease (STD) testing.  Diabetes screening. This is done by checking your blood sugar (glucose) after you have not eaten for a while (fasting). You may have this done every 1-3 years.  Abdominal aortic aneurysm (AAA) screening. You may need this if you are a current or former smoker.  Osteoporosis. You may be screened starting at age 69 if you are at high risk. Talk with your health care provider about your test results, treatment options, and if necessary,  the need for more tests. Vaccines  Your health care provider may recommend certain vaccines, such as:  Influenza vaccine. This is recommended every year.  Tetanus, diphtheria, and acellular pertussis (Tdap, Td) vaccine. You may need a Td booster  every 10 years.  Zoster vaccine. You may need this after age 46.  Pneumococcal 13-valent conjugate (PCV13) vaccine. One dose is recommended after age 33.  Pneumococcal polysaccharide (PPSV23) vaccine. One dose is recommended after age 23. Talk to your health care provider about which screenings and vaccines you need and how often you need them. This information is not intended to replace advice given to you by your health care provider. Make sure you discuss any questions you have with your health care provider. Document Released: 02/18/2015 Document Revised: 10/12/2015 Document Reviewed: 11/23/2014 Elsevier Interactive Patient Education  2017 [place] Prevention in the Home Falls can cause injuries. They can happen to people of all ages. There are many things you can do to make your home safe and to help prevent falls. What can I do on the outside of my home?  Regularly fix the edges of walkways and driveways and fix any cracks.  Remove anything that might make you trip as you walk through a door, such as a raised step or threshold.  Trim any bushes or trees on the path to your home.  Use bright outdoor lighting.  Clear any walking paths of anything that might make someone trip, such as rocks or tools.  Regularly check to see if handrails are loose or broken. Make sure that both sides of any steps have handrails.  Any raised decks and porches should have guardrails on the edges.  Have any leaves, snow, or ice cleared regularly.  Use sand or salt on walking paths during winter.  Clean up any spills in your garage right away. This includes oil or grease spills. What can I do in the bathroom?  Use night lights.  Install grab bars by the toilet and in the tub and shower. Do not use towel bars as grab bars.  Use non-skid mats or decals in the tub or shower.  If you need to sit down in the shower, use a plastic, non-slip stool.  Keep the floor dry. Clean up any  water that spills on the floor as soon as it happens.  Remove soap buildup in the tub or shower regularly.  Attach bath mats securely with double-sided non-slip rug tape.  Do not have throw rugs and other things on the floor that can make you trip. What can I do in the bedroom?  Use night lights.  Make sure that you have a light by your bed that is easy to reach.  Do not use any sheets or blankets that are too big for your bed. They should not hang down onto the floor.  Have a firm chair that has side arms. You can use this for support while you get dressed.  Do not have throw rugs and other things on the floor that can make you trip. What can I do in the kitchen?  Clean up any spills right away.  Avoid walking on wet floors.  Keep items that you use a lot in easy-to-reach places.  If you need to reach something above you, use a strong step stool that has a grab bar.  Keep electrical cords out of the way.  Do not use floor polish or wax that makes floors slippery. If you must use  wax, use non-skid floor wax.  Do not have throw rugs and other things on the floor that can make you trip. What can I do with my stairs?  Do not leave any items on the stairs.  Make sure that there are handrails on both sides of the stairs and use them. Fix handrails that are broken or loose. Make sure that handrails are as long as the stairways.  Check any carpeting to make sure that it is firmly attached to the stairs. Fix any carpet that is loose or worn.  Avoid having throw rugs at the top or bottom of the stairs. If you do have throw rugs, attach them to the floor with carpet tape.  Make sure that you have a light switch at the top of the stairs and the bottom of the stairs. If you do not have them, ask someone to add them for you. What else can I do to help prevent falls?  Wear shoes that:  Do not have high heels.  Have rubber bottoms.  Are comfortable and fit you well.  Are closed  at the toe. Do not wear sandals.  If you use a stepladder:  Make sure that it is fully opened. Do not climb a closed stepladder.  Make sure that both sides of the stepladder are locked into place.  Ask someone to hold it for you, if possible.  Clearly [place] and make sure that you can see:  Any grab bars or handrails.  First and last steps.  Where the edge of each step is.  Use tools that help you move around (mobility aids) if they are needed. These include:  Canes.  Walkers.  Scooters.  Crutches.  Turn on the lights when you go into a dark area. Replace any light bulbs as soon as they burn out.  Set up your furniture so you have a clear path. Avoid moving your furniture around.  If any of your floors are uneven, fix them.  If there are any pets around you, be aware of where they are.  Review your medicines with your doctor. Some medicines can make you feel dizzy. This can increase your chance of falling. Ask your doctor what other things that you can do to help prevent falls. This information is not intended to replace advice given to you by your health care provider. Make sure you discuss any questions you have with your health care provider. Document Released: 11/18/2008 Document Revised: 06/30/2015 Document Reviewed: 02/26/2014 Elsevier Interactive Patient Education  2017 [employer].

## 2018-01-13 ENCOUNTER — Encounter: Payer: Self-pay | Admitting: Gastroenterology

## 2018-01-13 ENCOUNTER — Ambulatory Visit (INDEPENDENT_AMBULATORY_CARE_PROVIDER_SITE_OTHER): Payer: Medicare Other | Admitting: Gastroenterology

## 2018-01-13 VITALS — BP 123/69 | HR 78 | Ht 67.0 in | Wt 134.4 lb

## 2018-01-13 DIAGNOSIS — R140 Abdominal distension (gaseous): Secondary | ICD-10-CM | POA: Diagnosis not present

## 2018-01-13 NOTE — Progress Notes (Signed)
Jonathon Bellows MD, MRCP(U.K) [address]  [place]  [place],  [address]  Main: [phone]  Fax: [phone]   Gastroenterology Consultation  Referring Provider:     Hubbard Hartshorn, FNP Primary Care Physician:  Hubbard Hartshorn, FNP Primary Gastroenterologist:  Dr. Jonathon Bellows  Reason for Consultation:     GERD, colon cancer history         HPI:   [name] is a 67 y.o. y/o male referred for consultation & management  by Dr. Uvaldo Rising, Astrid Divine, FNP.    He  has been referred for GERD.H/o colon cancer in 2001 , S/p surgery and chemotherapy .2nd surgery to treat scar tissue  Last colonoscopy back in 2018 - no adenomas or large polyps seen.    CT renal stone study in 09/2017 showed B/l renal calculi , mild rt sided hydroureter and fluid filled mild diffuse distension of the jejunum with smooth taperin to the mid pelvis suggesting small bowel dysmotility.    Labs 11/25/17 : CBC-normal    He says that he is here to discuss if he has extrapancreatic insufficiency.   He states that he has a lot of gas, soft stools since colon resection. Says that he has had it for a long time. Takes Bentyl for spasms which helps.    Denies any issues with reflux . Main issue is lots of gas,bloating,abdominal disetnsion and constipation.   Has had  2 episodes of c diff diarrhea  Past Medical History:  Diagnosis Date  . Abnormal laboratory test    Sees Dr. Ma Hillock  . Acid reflux   . Allergy bee venom   mainly as a child  . Anxiety 12/08/1969  . Arthritis   . Asthma 12/08/1953   mainly as a child  . Chronic back pain   . Colon cancer ([facility]) 11/1999   stage 3 s/p colon resection  . Depression   . H/O Clostridium difficile infection   . History of chemotherapy    5-FU pump/leukovorin  . History of kidney stones   . Hypothyroidism   . Insomnia   . Multiple myeloma ([place]) 12/2011   smoldering vs mgus  . Nephrolithiasis 08/28/2017  . Osteoporosis   . Seasonal allergies     Past  Surgical History:  Procedure Laterality Date  . BACK SURGERY    . COLON SURGERY  2001   resection, 2nd surgery for scar tissue removal  . COLONOSCOPY    . COLONOSCOPY WITH PROPOFOL N/A 03/26/2016   Procedure: COLONOSCOPY WITH PROPOFOL;  Surgeon: Lollie Sails, MD;  Location: [facility] ENDOSCOPY;  Service: Endoscopy;  Laterality: N/A;  . CYSTOSCOPY/URETEROSCOPY/HOLMIUM LASER/STENT PLACEMENT Right 09/03/2017   Procedure: CYSTOSCOPY/URETEROSCOPY/HOLMIUM LASER/STENT PLACEMENT;  Surgeon: Abbie Sons, MD;  Location: [facility] ORS;  Service: Urology;  Laterality: Right;  . ESOPHAGOGASTRODUODENOSCOPY  10/2010  . EYE SURGERY  1994 and 2010   RKA in 22 and [place] in 2010  . HERNIA REPAIR  1970   lower left  . LUMBAR FUSION     L4-L5, L5-S1  . SACROILIAC JOINT FUSION    . SHOULDER SURGERY Left   . SPINAL CORD STIMULATOR IMPLANT      Prior to Admission medications   Medication Sig Start Date End Date Taking? Authorizing Provider  celecoxib (CELEBREX) 200 MG capsule Take 1 capsule (200 mg total) by mouth daily. 08/19/17  Yes Poulose, Bethel Born, NP  Cholecalciferol (VITAMIN D) 2000 UNITS CAPS Take 1 capsule by mouth daily.   Yes  [provider]  dicyclomine (BENTYL) 10 MG capsule TAKE 1 CAPSULE BY MOUTH TWICE DAILY 10/28/17  Yes Hubbard Hartshorn, FNP  esomeprazole (NEXIUM 24HR) 20 MG capsule Take 20 mg by mouth daily.    Yes [provider]  Multiple Vitamins-Minerals (CENTRUM SILVER ULTRA MENS) TABS Take 1 tablet by mouth daily.   Yes [provider]  traZODone (DESYREL) 100 MG tablet Take 1 tablet (100 mg total) by mouth at bedtime as needed. for sleep 08/19/17  Yes Poulose, Bethel Born, NP    Family History  Problem Relation Age of Onset  . Lymphoma Mother   . Arthritis Mother        deceased  . Cancer Mother   . Lung cancer Father   . Alcohol abuse Father        deceased  . Cancer Father   . Heart disease Unknown        grandparents  . Hyperlipidemia Sister   .  Diabetes Brother   . Diabetes Son   . Alcohol abuse Brother   . Arthritis Brother   . Depression Brother   . Diabetes Brother   . Alcohol abuse Sister   . Arthritis Sister   . Depression Sister   . Alcohol abuse Son   . Early death Son        truck accident 48 at age 24     Social History   Tobacco Use  . Smoking status: Former Smoker    Packs/day: 1.00    Years: 10.00    Pack years: 10.00    Types: Cigarettes    Last attempt to quit: 03/06/1970    Years since quitting: 47.8  . Smokeless tobacco: Never Used  . Tobacco comment: started at age 28 and quit at age 44  Substance Use Topics  . Alcohol use: No    Frequency: Never  . Drug use: No    Allergies as of 01/13/2018 - Review Complete 01/13/2018  Allergen Reaction Noted  . Bee venom Swelling 09/04/2014    Review of Systems:    All systems reviewed and negative except where noted in HPI.   Physical Exam:  BP 123/69   Pulse 78   Ht _0  (1.702 m)   Wt 134 lb 6.4 oz (61 kg)   BMI 21.05 kg/m  No LMP for male patient. Psych:  Alert and cooperative. Normal mood and affect. General:   Alert,  Well-developed, well-nourished, pleasant and cooperative in NAD Head:  Normocephalic and atraumatic. Eyes:  Sclera clear, no icterus.   Conjunctiva pink. Ears:  Normal auditory acuity. Nose:  No deformity, discharge, or lesions. Mouth:  No deformity or lesions,oropharynx pink & moist. Neck:  Supple; no masses or thyromegaly. Lungs:  Respirations even and unlabored.  Clear throughout to auscultation.   No wheezes, crackles, or rhonchi. No acute distress. Heart:  Regular rate and rhythm; no murmurs, clicks, rubs, or gallops. Abdomen:  Central abdominal scar. Normal bowel sounds.  No bruits.  Soft, non-tender and non-distended without masses, hepatosplenomegaly or hernias noted.  No guarding or rebound tenderness.    Neurologic:  Alert and oriented x3;  grossly normal neurologically. Skin:  Intact without significant lesions  or rashes. No jaundice. Lymph Nodes:  No significant cervical adenopathy. Psych:  Alert and cooperative. Normal mood and affect.  Imaging Studies: No results found.  Assessment and Plan:   [name] is a 67 y.o. y/o male has been referred for gas and bloating. Likeley differentials  are constipation secondary to adhesions and bowel dysmotility vs SIBO vs EPI.   Plan  1. Colonoscopy in 2023 for colon cancer surveillance 2. Trial of daily miralax- if no better then try pancreatic lipase for a week , if still no better will try activated charcoal +/- Xifaxan 3. LOW FODMAP diet   Follow up in 4 weeks  Dr Jonathon Bellows MD,MRCP(U.K)

## 2018-01-17 ENCOUNTER — Inpatient Hospital Stay: Payer: Medicare Other | Attending: Internal Medicine

## 2018-01-17 DIAGNOSIS — Z79899 Other long term (current) drug therapy: Secondary | ICD-10-CM | POA: Diagnosis not present

## 2018-01-17 DIAGNOSIS — Z9221 Personal history of antineoplastic chemotherapy: Secondary | ICD-10-CM | POA: Insufficient documentation

## 2018-01-17 DIAGNOSIS — Z87891 Personal history of nicotine dependence: Secondary | ICD-10-CM | POA: Diagnosis not present

## 2018-01-17 DIAGNOSIS — C9000 Multiple myeloma not having achieved remission: Secondary | ICD-10-CM | POA: Diagnosis not present

## 2018-01-17 DIAGNOSIS — Z85038 Personal history of other malignant neoplasm of large intestine: Secondary | ICD-10-CM | POA: Diagnosis not present

## 2018-01-17 DIAGNOSIS — D472 Monoclonal gammopathy: Secondary | ICD-10-CM

## 2018-01-17 LAB — COMPREHENSIVE METABOLIC PANEL
ALBUMIN: 4.2 g/dL (ref 3.5–5.0)
ALK PHOS: 60 U/L (ref 38–126)
ALT: 14 U/L (ref 0–44)
ANION GAP: 7 (ref 5–15)
AST: 22 U/L (ref 15–41)
BILIRUBIN TOTAL: 0.7 mg/dL (ref 0.3–1.2)
BUN: 27 mg/dL — ABNORMAL HIGH (ref 8–23)
CHLORIDE: 104 mmol/L (ref 98–111)
CO2: 29 mmol/L (ref 22–32)
CREATININE: 1.17 mg/dL (ref 0.61–1.24)
Calcium: 9.2 mg/dL (ref 8.9–10.3)
Glucose, Bld: 117 mg/dL — ABNORMAL HIGH (ref 70–99)
POTASSIUM: 4.2 mmol/L (ref 3.5–5.1)
Sodium: 140 mmol/L (ref 135–145)
Total Protein: 7.1 g/dL (ref 6.5–8.1)

## 2018-01-17 LAB — CBC WITH DIFFERENTIAL/PLATELET
ABS IMMATURE GRANULOCYTES: 0.01 10*3/uL (ref 0.00–0.07)
Basophils Absolute: 0 10*3/uL (ref 0.0–0.1)
Basophils Relative: 0 %
Eosinophils Absolute: 0.1 10*3/uL (ref 0.0–0.5)
Eosinophils Relative: 2 %
HEMATOCRIT: 39.9 % (ref 39.0–52.0)
HEMOGLOBIN: 13.7 g/dL (ref 13.0–17.0)
IMMATURE GRANULOCYTES: 0 %
LYMPHS ABS: 1.9 10*3/uL (ref 0.7–4.0)
LYMPHS PCT: 28 %
MCH: 32.5 pg (ref 26.0–34.0)
MCHC: 34.3 g/dL (ref 30.0–36.0)
MCV: 94.5 fL (ref 80.0–100.0)
Monocytes Absolute: 0.7 10*3/uL (ref 0.1–1.0)
Monocytes Relative: 11 %
NEUTROS ABS: 4.1 10*3/uL (ref 1.7–7.7)
NEUTROS PCT: 59 %
NRBC: 0 % (ref 0.0–0.2)
Platelets: 324 10*3/uL (ref 150–400)
RBC: 4.22 MIL/uL (ref 4.22–5.81)
RDW: 11.9 % (ref 11.5–15.5)
WBC: 6.9 10*3/uL (ref 4.0–10.5)

## 2018-01-20 LAB — KAPPA/LAMBDA LIGHT CHAINS
KAPPA FREE LGHT CHN: 125.2 mg/L — AB (ref 3.3–19.4)
Kappa, lambda light chain ratio: 9.86 — ABNORMAL HIGH (ref 0.26–1.65)
Lambda free light chains: 12.7 mg/L (ref 5.7–26.3)

## 2018-01-22 LAB — MULTIPLE MYELOMA PANEL, SERUM
ALBUMIN SERPL ELPH-MCNC: 4 g/dL (ref 2.9–4.4)
Albumin/Glob SerPl: 1.6 (ref 0.7–1.7)
Alpha 1: 0.2 g/dL (ref 0.0–0.4)
Alpha2 Glob SerPl Elph-Mcnc: 0.6 g/dL (ref 0.4–1.0)
B-GLOBULIN SERPL ELPH-MCNC: 0.9 g/dL (ref 0.7–1.3)
GAMMA GLOB SERPL ELPH-MCNC: 1 g/dL (ref 0.4–1.8)
GLOBULIN, TOTAL: 2.6 g/dL (ref 2.2–3.9)
IgA: 384 mg/dL (ref 61–437)
IgG (Immunoglobin G), Serum: 800 mg/dL (ref 700–1600)
IgM (Immunoglobulin M), Srm: 66 mg/dL (ref 20–172)
M PROTEIN SERPL ELPH-MCNC: 0.3 g/dL — AB
Total Protein ELP: 6.6 g/dL (ref 6.0–8.5)

## 2018-01-24 ENCOUNTER — Other Ambulatory Visit: Payer: Self-pay

## 2018-01-24 ENCOUNTER — Encounter: Payer: Self-pay | Admitting: Internal Medicine

## 2018-01-24 ENCOUNTER — Inpatient Hospital Stay (HOSPITAL_BASED_OUTPATIENT_CLINIC_OR_DEPARTMENT_OTHER): Payer: Medicare Other | Admitting: Internal Medicine

## 2018-01-24 VITALS — BP 161/88 | HR 59 | Temp 98.2°F | Resp 18 | Ht 67.0 in | Wt 138.0 lb

## 2018-01-24 DIAGNOSIS — D472 Monoclonal gammopathy: Secondary | ICD-10-CM

## 2018-01-24 DIAGNOSIS — Z85038 Personal history of other malignant neoplasm of large intestine: Secondary | ICD-10-CM

## 2018-01-24 DIAGNOSIS — Z9221 Personal history of antineoplastic chemotherapy: Secondary | ICD-10-CM

## 2018-01-24 DIAGNOSIS — Z79899 Other long term (current) drug therapy: Secondary | ICD-10-CM

## 2018-01-24 DIAGNOSIS — C9000 Multiple myeloma not having achieved remission: Secondary | ICD-10-CM

## 2018-01-24 DIAGNOSIS — Z87891 Personal history of nicotine dependence: Secondary | ICD-10-CM

## 2018-01-24 NOTE — Assessment & Plan Note (Addendum)
#  SMOLDERING MULTIPLE MYELOMA [2013 s/p BMBx].  No clinical evidence of progression.  Stable  # M protein 0.3 g/dL; kappa lambda light chain ratio on 10.  Hemoglobin calcium normal.  Creatinine stable around 1.17.  # colon cancer- stage III s/p colonoscopy- feb 2018; follows with Dr.Anna.   # ? UTI/kidney stone [Dr.Stoiff]- s/p Anti-biotics.  # DISPOSITION:  # follow up in 6 months/labs-cbc/cmp/MM panel/K-L light chains- 1 week prior-Dr.B

## 2018-01-24 NOTE — Progress Notes (Signed)
[place] OFFICE PROGRESS NOTE  Patient Care Team: Hubbard Hartshorn, FNP as PCP - General (Family Medicine) Cammie Sickle, MD as Consulting Physician (Internal Medicine) Lollie Sails, MD as Consulting Physician (Gastroenterology)  Cancer Staging No matching staging information was found for the patient.   Oncology History   # NOV 2013- SMOLDERING MULTIPLE MYELOMA [IgA Kappa; BMBx- 10-12% plasma cell;Dr.Trillo [place]]  # 2001- COLON CA STAGE III [s/p chemo; [place]]  # colonoscopy- 2018/ Dr.Anna  DIAGNOSIS: Smoldering myeloma  GOALS: Cure  CURRENT/MOST RECENT THERAPY: Surveillance      Smoldering multiple myeloma (SMM) (HCC)   INTERVAL HISTORY:  LINDSEY HOMMEL 67 y.o.  male pleasant patient above history of smoldering myeloma currently on surveillance is here for follow-up.  Patient denies any blood in stools or black or stools.  Denies any bone pain.  Appetite is good.  No weight loss but no tingling or numbness.   Patient has recent episode of UTI for which he was treated with antibiotics by urology.  Review of Systems  Constitutional: Negative for chills, diaphoresis, fever, malaise/fatigue and weight loss.  HENT: Negative for nosebleeds and sore throat.   Eyes: Negative for double vision.  Respiratory: Negative for cough, hemoptysis, sputum production, shortness of breath and wheezing.   Cardiovascular: Negative for chest pain, palpitations, orthopnea and leg swelling.  Gastrointestinal: Negative for abdominal pain, blood in stool, constipation, diarrhea, heartburn, melena, nausea and vomiting.  Genitourinary: Negative for dysuria, frequency and urgency.  Musculoskeletal: Negative for back pain and joint pain.  Skin: Negative.  Negative for itching and rash.  Neurological: Negative for dizziness, tingling, focal weakness, weakness and headaches.  Endo/Heme/Allergies: Does not bruise/bleed easily.  Psychiatric/Behavioral: Negative for  depression. The patient is not nervous/anxious and does not have insomnia.       PAST MEDICAL HISTORY :  Past Medical History:  Diagnosis Date  . Abnormal laboratory test    Sees Dr. Ma Hillock  . Acid reflux   . Allergy bee venom   mainly as a child  . Anxiety 12/08/1969  . Arthritis   . Asthma 12/08/1953   mainly as a child  . Chronic back pain   . Colon cancer ([place]) 11/1999   stage 3 s/p colon resection  . Depression   . H/O Clostridium difficile infection   . History of chemotherapy    5-FU pump/leukovorin  . History of kidney stones   . Hypothyroidism   . Insomnia   . Multiple myeloma ([place]) 12/2011   smoldering vs mgus  . Nephrolithiasis 08/28/2017  . Osteoporosis   . Seasonal allergies     PAST SURGICAL HISTORY :   Past Surgical History:  Procedure Laterality Date  . BACK SURGERY    . COLON SURGERY  2001   resection, 2nd surgery for scar tissue removal  . COLONOSCOPY    . COLONOSCOPY WITH PROPOFOL N/A 03/26/2016   Procedure: COLONOSCOPY WITH PROPOFOL;  Surgeon: Lollie Sails, MD;  Location: [facility] ENDOSCOPY;  Service: Endoscopy;  Laterality: N/A;  . CYSTOSCOPY/URETEROSCOPY/HOLMIUM LASER/STENT PLACEMENT Right 09/03/2017   Procedure: CYSTOSCOPY/URETEROSCOPY/HOLMIUM LASER/STENT PLACEMENT;  Surgeon: Abbie Sons, MD;  Location: [facility] ORS;  Service: Urology;  Laterality: Right;  . ESOPHAGOGASTRODUODENOSCOPY  10/2010  . EYE SURGERY  1994 and 2010   RKA in 8 and Lazik in 2010  . HERNIA REPAIR  1970   lower left  . LUMBAR FUSION     L4-L5, L5-S1  . SACROILIAC JOINT FUSION    .  SHOULDER SURGERY Left   . SPINAL CORD STIMULATOR IMPLANT      FAMILY HISTORY :   Family History  Problem Relation Age of Onset  . Lymphoma Mother   . Arthritis Mother        deceased  . Cancer Mother   . Lung cancer Father   . Alcohol abuse Father        deceased  . Cancer Father   . Heart disease Unknown        grandparents  . Hyperlipidemia Sister   . Diabetes Brother   .  Diabetes Son   . Alcohol abuse Brother   . Arthritis Brother   . Depression Brother   . Diabetes Brother   . Alcohol abuse Sister   . Arthritis Sister   . Depression Sister   . Alcohol abuse Son   . Early death Son        truck accident 76 at age 42    SOCIAL HISTORY:   Social History   Tobacco Use  . Smoking status: Former Smoker    Packs/day: 1.00    Years: 10.00    Pack years: 10.00    Types: Cigarettes    Last attempt to quit: 03/06/1970    Years since quitting: 47.9  . Smokeless tobacco: Never Used  . Tobacco comment: started at age 38 and quit at age 89  Substance Use Topics  . Alcohol use: No    Frequency: Never  . Drug use: No    ALLERGIES:  is allergic to bee venom.  MEDICATIONS:  Current Outpatient Medications  Medication Sig Dispense Refill  . celecoxib (CELEBREX) 200 MG capsule Take 1 capsule (200 mg total) by mouth daily. 90 capsule 1  . Cholecalciferol (VITAMIN D) 2000 UNITS CAPS Take 1 capsule by mouth daily.    Marland Kitchen dicyclomine (BENTYL) 10 MG capsule TAKE 1 CAPSULE BY MOUTH TWICE DAILY 180 capsule 0  . esomeprazole (NEXIUM 24HR) 20 MG capsule Take 20 mg by mouth daily.     . Multiple Vitamins-Minerals (CENTRUM SILVER ULTRA MENS) TABS Take 1 tablet by mouth daily.    . traZODone (DESYREL) 100 MG tablet Take 1 tablet (100 mg total) by mouth at bedtime as needed. for sleep 90 tablet 1   No current facility-administered medications for this visit.     PHYSICAL EXAMINATION: ECOG PERFORMANCE STATUS: 0 - Asymptomatic  BP (!) 161/88 (BP Location: Left Arm, Patient Position: Sitting)   Pulse (!) 59   Temp 98.2 F (36.8 C) (Tympanic)   Resp 18   Ht 5' 7"  (1.702 m)   Wt 138 lb (62.6 kg)   BMI 21.61 kg/m   Filed Weights   01/24/18 1045  Weight: 138 lb (62.6 kg)    Physical Exam  Constitutional: He is oriented to person, place, and time and well-developed, well-nourished, and in no distress.  HENT:  Head: Normocephalic and atraumatic.   Mouth/Throat: Oropharynx is clear and moist. No oropharyngeal exudate.  Eyes: Pupils are equal, round, and reactive to light.  Neck: Normal range of motion. Neck supple.  Cardiovascular: Normal rate and regular rhythm.  Pulmonary/Chest: No respiratory distress. He has no wheezes.  Abdominal: Soft. Bowel sounds are normal. He exhibits no distension and no mass. There is no abdominal tenderness. There is no rebound and no guarding.  Musculoskeletal: Normal range of motion.        General: No tenderness or edema.  Neurological: He is alert and oriented to person, place, and time.  Skin: Skin is warm.  Psychiatric: Affect normal.   LABORATORY DATA:  I have reviewed the data as listed    Component Value Date/Time   NA 140 01/17/2018 1104   K 4.2 01/17/2018 1104   CL 104 01/17/2018 1104   CO2 29 01/17/2018 1104   GLUCOSE 117 (H) 01/17/2018 1104   BUN 27 (H) 01/17/2018 1104   CREATININE 1.17 01/17/2018 1104   CREATININE 1.27 (H) 11/25/2017 1401   CALCIUM 9.2 01/17/2018 1104   CALCIUM 9.6 12/28/2013 1428   PROT 7.1 01/17/2018 1104   PROT 6.7 06/27/2012 1136   ALBUMIN 4.2 01/17/2018 1104   ALBUMIN 3.5 06/27/2012 1136   AST 22 01/17/2018 1104   AST 17 06/27/2012 1136   ALT 14 01/17/2018 1104   ALT 22 06/27/2012 1136   ALKPHOS 60 01/17/2018 1104   ALKPHOS 91 06/27/2012 1136   BILITOT 0.7 01/17/2018 1104   BILITOT 0.3 06/27/2012 1136   GFRNONAA >60 01/17/2018 1104   GFRNONAA 58 (L) 11/25/2017 1401   GFRAA >60 01/17/2018 1104   GFRAA 67 11/25/2017 1401    No results found for: SPEP, UPEP  Lab Results  Component Value Date   WBC 6.9 01/17/2018   NEUTROABS 4.1 01/17/2018   HGB 13.7 01/17/2018   HCT 39.9 01/17/2018   MCV 94.5 01/17/2018   PLT 324 01/17/2018      Chemistry      Component Value Date/Time   NA 140 01/17/2018 1104   K 4.2 01/17/2018 1104   CL 104 01/17/2018 1104   CO2 29 01/17/2018 1104   BUN 27 (H) 01/17/2018 1104   CREATININE 1.17 01/17/2018 1104    CREATININE 1.27 (H) 11/25/2017 1401      Component Value Date/Time   CALCIUM 9.2 01/17/2018 1104   CALCIUM 9.6 12/28/2013 1428   ALKPHOS 60 01/17/2018 1104   ALKPHOS 91 06/27/2012 1136   AST 22 01/17/2018 1104   AST 17 06/27/2012 1136   ALT 14 01/17/2018 1104   ALT 22 06/27/2012 1136   BILITOT 0.7 01/17/2018 1104   BILITOT 0.3 06/27/2012 1136      RADIOGRAPHIC STUDIES: I have personally reviewed the radiological images as listed and agreed with the findings in the report. No results found.   ASSESSMENT & PLAN:  Smoldering multiple myeloma (SMM) ([place]) # SMOLDERING MULTIPLE MYELOMA [2013 s/p BMBx].  No clinical evidence of progression.  Stable  # M protein 0.3 g/dL; kappa lambda light chain ratio on 10.  Hemoglobin calcium normal.  Creatinine stable around 1.17.  # colon cancer- stage III s/p colonoscopy- feb 2018; follows with Dr.Anna.   # ? UTI/kidney stone [Dr.Stoiff]- s/p Anti-biotics.  # DISPOSITION:  # follow up in 6 months/labs-cbc/cmp/MM panel/K-L light chains- 1 week prior-Dr.B    Orders Placed This Encounter  Procedures  . CBC with Differential/Platelet    Standing Status:   Future    Standing Expiration Date:   01/25/2019  . Comprehensive metabolic panel    Standing Status:   Future    Standing Expiration Date:   01/25/2019  . Kappa/lambda light chains    Standing Status:   Future    Standing Expiration Date:   01/25/2019  . Multiple Myeloma Panel (SPEP&IFE w/QIG)    Standing Status:   Future    Standing Expiration Date:   01/25/2019   All questions were answered. The patient knows to call the clinic with any problems, questions or concerns.      Cammie Sickle, MD 01/26/2018  6:19 PM

## 2018-02-11 ENCOUNTER — Other Ambulatory Visit: Payer: Self-pay

## 2018-02-11 ENCOUNTER — Ambulatory Visit (INDEPENDENT_AMBULATORY_CARE_PROVIDER_SITE_OTHER): Payer: Medicare Other | Admitting: Gastroenterology

## 2018-02-11 ENCOUNTER — Encounter: Payer: Self-pay | Admitting: Gastroenterology

## 2018-02-11 VITALS — BP 172/90 | HR 75 | Ht 67.0 in | Wt 130.0 lb

## 2018-02-11 DIAGNOSIS — R1032 Left lower quadrant pain: Secondary | ICD-10-CM

## 2018-02-11 NOTE — Progress Notes (Signed)
Jonathon Bellows MD, MRCP(U.K) [address]  [place]  [place], [place] [address]  Main: [phone]  Fax: [phone]   Primary Care Physician: Hubbard Hartshorn, FNP  Primary Gastroenterologist:  Dr. Jonathon Bellows   Chief Complaint  Patient presents with  . Follow-up    Abdominal pain, bloating    HPI: [name] is a 68 y.o. male   Summary of history :  Initially referred and seen on 01/13/18 for GERD. , S/p surgery and chemotherapy .2nd surgery to treat scar tissue  Last colonoscopy back in 2018 - no adenomas or large polyps seen.  CT renal stone study in 09/2017 showed B/l renal calculi , mild rt sided hydroureter and fluid filled mild diffuse distension of the jejunum with smooth taperin to the mid pelvis suggesting small bowel dysmotility. Labs 11/25/17 : CBC-normal   He states that he has a lot of gas, soft stools since colon resection. Says that he has had it for a long time. Takes Bentyl for spasms which helps.    Denies any issues with reflux . Main issue is lots of gas,bloating,abdominal disetnsion and constipation.   Has had  2 episodes of c diff diarrhea   Interval history   01/13/2018-  02/11/2018  Since last visit , developed pain in left lower abdomen , no better with a bowel movement , not much gas, Took CREON and his symptoms started at the same time. Stopped cREON a week back for a week. Pain left lower quadrant, not bothering him at this time. No diarrhea. Bowel movements are normal.  Daily soft bowel movements.   Takes Celebrex daily for many years for arthritis.    Current Outpatient Medications  Medication Sig Dispense Refill  . celecoxib (CELEBREX) 200 MG capsule Take 1 capsule (200 mg total) by mouth daily. 90 capsule 1  . Cholecalciferol (VITAMIN D) 2000 UNITS CAPS Take 1 capsule by mouth daily.    Marland Kitchen dicyclomine (BENTYL) 10 MG capsule TAKE 1 CAPSULE BY MOUTH TWICE DAILY 180 capsule 0  . esomeprazole (NEXIUM 24HR) 20 MG capsule Take 20 mg by  mouth daily.     . Multiple Vitamins-Minerals (CENTRUM SILVER ULTRA MENS) TABS Take 1 tablet by mouth daily.    . traZODone (DESYREL) 100 MG tablet Take 1 tablet (100 mg total) by mouth at bedtime as needed. for sleep 90 tablet 1   No current facility-administered medications for this visit.     Allergies as of 02/11/2018 - Review Complete 02/11/2018  Allergen Reaction Noted  . Bee venom Swelling 09/04/2014    ROS:  General: Negative for anorexia, weight loss, fever, chills, fatigue, weakness. ENT: Negative for hoarseness, difficulty swallowing , nasal congestion. CV: Negative for chest pain, angina, palpitations, dyspnea on exertion, peripheral edema.  Respiratory: Negative for dyspnea at rest, dyspnea on exertion, cough, sputum, wheezing.  GI: See history of present illness. GU:  Negative for dysuria, hematuria, urinary incontinence, urinary frequency, nocturnal urination.  Endo: Negative for unusual weight change.    Physical Examination:   BP (!) 172/90   Pulse 75   Ht 5\' 7"  (1.702 m)   Wt 130 lb (59 kg)   BMI 20.36 kg/m   General: Well-nourished, well-developed in no acute distress.  Eyes: No icterus. Conjunctivae pink. Mouth: Oropharyngeal mucosa moist and pink , no lesions erythema or exudate. Lungs: Clear to auscultation bilaterally. Non-labored. Heart: Regular rate and rhythm, no murmurs rubs or gallops.  Abdomen: Bowel sounds are normal, nontender, nondistended, no  hepatosplenomegaly or masses, no abdominal bruits or hernia , no rebound or guarding.   Extremities: No lower extremity edema. No clubbing or deformities. Neuro: Alert and oriented x 3.  Grossly intact. Skin: Warm and dry, no jaundice.   Psych: Alert and cooperative, normal mood and affect.   Imaging Studies: No results found.  Assessment and Plan:   [name] is a 68 y.o. y/o male here to follow up f, change of symptoms since last visit, LLQ pain , getting worse, no diarrhea ? Diverticulitis.     Plan  1. CT abdomen in 1-2 days  2. If gets worse to call me asap for antibiotics- at this point am not too confident he has diverticulitis 3. IB guard for pain along with bentyl   Dr Jonathon Bellows  MD,MRCP [facility]) Follow up in 2 weeks

## 2018-02-13 ENCOUNTER — Ambulatory Visit: Payer: Medicare Other | Admitting: Gastroenterology

## 2018-02-13 ENCOUNTER — Ambulatory Visit
Admission: RE | Admit: 2018-02-13 | Discharge: 2018-02-13 | Disposition: A | Discharge status: Home / Self Care | Payer: Medicare Other | Source: Ambulatory Visit | Attending: Gastroenterology | Admitting: Gastroenterology

## 2018-02-13 DIAGNOSIS — R1032 Left lower quadrant pain: Secondary | ICD-10-CM | POA: Diagnosis not present

## 2018-02-13 DIAGNOSIS — N132 Hydronephrosis with renal and ureteral calculous obstruction: Secondary | ICD-10-CM | POA: Diagnosis not present

## 2018-02-13 MED ORDER — IOHEXOL 300 MG/ML  SOLN
100.0000 mL | Freq: Once | INTRAMUSCULAR | Status: AC | PRN
  Administered 2018-02-13: 80 mL via INTRAVENOUS

## 2018-02-14 ENCOUNTER — Telehealth: Payer: Self-pay | Admitting: Urology

## 2018-02-14 ENCOUNTER — Telehealth: Payer: Self-pay

## 2018-02-14 MED ORDER — OXYCODONE-ACETAMINOPHEN 5-325 MG PO TABS: 1.0000 | ORAL_TABLET | ORAL | 0 refills | Status: DC | PRN

## 2018-02-14 NOTE — Telephone Encounter (Signed)
Received a message about Dr. Bernardo Heater this patient requesting pain medication.  He was seen and evaluated by gastroenterology and underwent a CT scan indicating 11 mm obstructing ureteral calculus.  He has an emergent follow-up with Dr. Bernardo Heater on Monday to discuss surgical intervention but is requesting pain meds for over the weekend.  Given that he is our patient and was recently seen, does seem reasonable to give him a few tablets to hold him over the weekend.  Percocet, 5 mg dispense #12 was sent to pharmacy.  He was advised that if his pain becomes severe, he develops fevers or chills, UTI symptoms he should present to the emergency room for more urgent/emergent evaluation/intervention.  Hollice Espy, MD

## 2018-02-14 NOTE — Telephone Encounter (Signed)
-----   Message from Jonathon Bellows, MD sent at 02/13/2018 11:09 AM EST ----- Sherald Hess inform he has stones in the left ureter and hydronephrosis. Needs urgent urology follow up , likely cause of pain.   C.c Hubbard Hartshorn, FNP   Dr Jonathon Bellows MD,MRCP [facility]) Gastroenterology/Hepatology Pager: [phone]

## 2018-02-14 NOTE — Telephone Encounter (Signed)
Spoke with pt and informed him of his CT results and Dr. Georgeann Oppenheim instructions for pt to see Urology as soon as possible. Pt states he'll contact his urologist to schedule a follow up.

## 2018-02-17 ENCOUNTER — Other Ambulatory Visit: Payer: Self-pay | Admitting: Radiology

## 2018-02-17 ENCOUNTER — Encounter: Payer: Self-pay | Admitting: Urology

## 2018-02-17 ENCOUNTER — Other Ambulatory Visit: Payer: Self-pay

## 2018-02-17 ENCOUNTER — Ambulatory Visit (INDEPENDENT_AMBULATORY_CARE_PROVIDER_SITE_OTHER): Payer: Medicare Other | Admitting: Urology

## 2018-02-17 VITALS — BP 130/75 | HR 59 | Ht 67.0 in | Wt 130.1 lb

## 2018-02-17 DIAGNOSIS — N201 Calculus of ureter: Secondary | ICD-10-CM

## 2018-02-17 DIAGNOSIS — R8281 Pyuria: Secondary | ICD-10-CM | POA: Diagnosis not present

## 2018-02-17 DIAGNOSIS — N23 Unspecified renal colic: Secondary | ICD-10-CM

## 2018-02-17 DIAGNOSIS — N132 Hydronephrosis with renal and ureteral calculous obstruction: Secondary | ICD-10-CM | POA: Diagnosis not present

## 2018-02-17 LAB — URINALYSIS, COMPLETE
Bilirubin, UA: NEGATIVE
Glucose, UA: NEGATIVE
Ketones, UA: NEGATIVE
Nitrite, UA: NEGATIVE
PH UA: 5.5 (ref 5.0–7.5)
RBC, UA: NEGATIVE
Specific Gravity, UA: >1.03 — ABNORMAL HIGH (ref 1.005–1.030)
Urobilinogen, Ur: 0.2 mg/dL (ref 0.2–1.0)

## 2018-02-17 LAB — MICROSCOPIC EXAMINATION
Bacteria, UA: NONE SEEN
Epithelial Cells (non renal): NONE SEEN /hpf (ref 0–10)

## 2018-02-17 MED ORDER — OXYCODONE-ACETAMINOPHEN 5-325 MG PO TABS: 1.0000 | ORAL_TABLET | ORAL | 0 refills | Status: DC | PRN

## 2018-02-17 MED ORDER — PROMETHAZINE HCL 12.5 MG PO TABS: 12.5000 mg | ORAL_TABLET | Freq: Four times a day (QID) | ORAL | 0 refills | Status: DC | PRN

## 2018-02-17 NOTE — H&P (View-Only) (Signed)
02/17/2018 3:03 PM   [name] [birth year] [id number]  Referring provider: Hubbard Hartshorn, [address] [place] [place], [place] [address]  Chief Complaint  Patient presents with  . Nephrolithiasis    HPI: 68 year old male presents for evaluation of a left renal colic.  He has a history of recurrent stone disease.  I last saw him November 2019 and he was doing well.  Approximately 2 weeks ago he had onset of left lower quadrant abdominal pain with some radiation into the left flank region.  There are no identifiable precipitating, aggravating or alleviating factors.  At times his pain is described as severe.  He has nausea without vomiting.  Denies fever or chills.  A CT of the abdomen pelvis ordered by Dr. Vicente Males on 02/13/2018 showed a 8 x 11 mm left proximal ureteral calculus with moderate hydronephrosis/hydroureter.  He has been taking oxycodone with good pain control.   PMH: Past Medical History:  Diagnosis Date  . Abnormal laboratory test    Sees Dr. Ma Hillock  . Acid reflux   . Allergy bee venom   mainly as a child  . Anxiety 12/08/1969  . Arthritis   . Asthma 12/08/1953   mainly as a child  . Chronic back pain   . Colon cancer ([place]) 11/1999   stage 3 s/p colon resection  . Depression   . H/O Clostridium difficile infection   . History of chemotherapy    5-FU pump/leukovorin  . History of kidney stones   . Hypothyroidism   . Insomnia   . Multiple myeloma ([place]) 12/2011   smoldering vs mgus  . Nephrolithiasis 08/28/2017  . Osteoporosis   . Seasonal allergies     Surgical History: Past Surgical History:  Procedure Laterality Date  . BACK SURGERY    . COLON SURGERY  2001   resection, 2nd surgery for scar tissue removal  . COLONOSCOPY    . COLONOSCOPY WITH PROPOFOL N/A 03/26/2016   Procedure: COLONOSCOPY WITH PROPOFOL;  Surgeon: Lollie Sails, MD;  Location: [facility] ENDOSCOPY;  Service: Endoscopy;  Laterality: N/A;  . CYSTOSCOPY/URETEROSCOPY/HOLMIUM  LASER/STENT PLACEMENT Right 09/03/2017   Procedure: CYSTOSCOPY/URETEROSCOPY/HOLMIUM LASER/STENT PLACEMENT;  Surgeon: Abbie Sons, MD;  Location: [facility] ORS;  Service: Urology;  Laterality: Right;  . ESOPHAGOGASTRODUODENOSCOPY  10/2010  . EYE SURGERY  1994 and 2010   RKA in 74 and [place] in 2010  . HERNIA REPAIR  1970   lower left  . LUMBAR FUSION     L4-L5, L5-S1  . SACROILIAC JOINT FUSION    . SHOULDER SURGERY Left   . SPINAL CORD STIMULATOR IMPLANT      Home Medications:  Allergies as of 02/17/2018      Reactions   Bee Venom Swelling   Bees Bees      Medication List       Accurate as of February 17, 2018  3:03 PM. Always use your most recent med list.        celecoxib 200 MG capsule Commonly known as:  CELEBREX Take 1 capsule (200 mg total) by mouth daily.   CENTRUM SILVER ULTRA MENS Tabs Take 1 tablet by mouth daily.   dicyclomine 10 MG capsule Commonly known as:  BENTYL TAKE 1 CAPSULE BY MOUTH TWICE DAILY   NEXIUM 24HR 20 MG capsule Generic drug:  esomeprazole Take 20 mg by mouth daily.   oxyCODONE-acetaminophen 5-325 MG tablet Commonly known as:  PERCOCET Take 1-2 tablets by mouth every 4 (four) hours as  needed for moderate pain or severe pain.   traZODone 100 MG tablet Commonly known as:  DESYREL Take 1 tablet (100 mg total) by mouth at bedtime as needed. for sleep   Vitamin D 50 MCG (2000 UT) Caps Take 1 capsule by mouth daily.       Allergies:  Allergies  Allergen Reactions  . Bee Venom Swelling    Bees Bees    Family History: Family History  Problem Relation Age of Onset  . Lymphoma Mother   . Arthritis Mother        deceased  . Cancer Mother   . Lung cancer Father   . Alcohol abuse Father        deceased  . Cancer Father   . Heart disease Other        grandparents  . Hyperlipidemia Sister   . Diabetes Brother   . Diabetes Son   . Alcohol abuse Brother   . Arthritis Brother   . Depression Brother   . Diabetes Brother   .  Alcohol abuse Sister   . Arthritis Sister   . Depression Sister   . Alcohol abuse Son   . Early death Son        truck accident 56 at age 64    Social History:  reports that he quit smoking about 47 years ago. His smoking use included cigarettes. He has a 10.00 pack-year smoking history. He has never used smokeless tobacco. He reports that he does not drink alcohol or use drugs.  ROS: UROLOGY Frequent Urination?: Yes Hard to postpone urination?: No Burning/pain with urination?: No Get up at night to urinate?: Yes Leakage of urine?: Yes Urine stream starts and stops?: Yes Trouble starting stream?: No Do you have to strain to urinate?: No Blood in urine?: No Urinary tract infection?: No Sexually transmitted disease?: No Injury to kidneys or bladder?: No Painful intercourse?: No Weak stream?: Yes Erection problems?: No Penile pain?: No  Gastrointestinal Nausea?: Yes Vomiting?: No Indigestion/heartburn?: No Diarrhea?: No Constipation?: No  Constitutional Fever: No Night sweats?: Yes Weight loss?: No Fatigue?: Yes  Skin Skin rash/lesions?: No Itching?: Yes  Eyes Blurred vision?: No Double vision?: No  Ears/Nose/Throat Sore throat?: No Sinus problems?: No  Hematologic/Lymphatic Swollen glands?: No Easy bruising?: No  Cardiovascular Leg swelling?: No Chest pain?: No  Respiratory Cough?: No Shortness of breath?: No  Endocrine Excessive thirst?: No  Musculoskeletal Back pain?: Yes Joint pain?: Yes  Neurological Headaches?: No Dizziness?: No  Psychologic Depression?: Yes Anxiety?: Yes  Physical Exam: BP 130/75 (BP Location: Left Arm, Patient Position: Sitting, Cuff Size: Normal)   Pulse (!) 59   Ht _0  (1.702 m)   Wt 130 lb 1.6 oz (59 kg)   BMI 20.38 kg/m   Constitutional:  Alert and oriented, No acute distress. HEENT: [place] AT, moist mucus membranes.  Trachea midline, no masses. Cardiovascular: No clubbing, cyanosis, or edema.   RRR Respiratory: Normal respiratory effort, no increased work of breathing.  Clear GI: Abdomen is soft, nontender, nondistended, no abdominal masses GU: No CVA tenderness Lymph: No cervical or inguinal lymphadenopathy. Skin: No rashes, bruises or suspicious lesions. Neurologic: Grossly intact, no focal deficits, moving all 4 extremities. Psychiatric: Normal mood and affect.  Laboratory Data:  Urinalysis Dipstick trace leukocyte/microscopy negative  Pertinent Imaging: CT images were personally reviewed   Assessment & Plan:   68 year old male with an 11 mm left proximal ureteral calculus and intermittent renal colic.  He was informed based on stone  size it is unlikely to pass spontaneously.  Management options were discussed including ureteroscopy with laser lithotripsy/stone removal and shockwave lithotripsy.  The pros and cons of each treatment were discussed.  He would like to schedule ureteroscopic removal.  The indications and nature of the planned procedure were discussed as well as the potential  benefits and expected outcome.  Alternatives have been discussed in detail. The most common complications and side effects were discussed including but not limited to infection/sepsis; blood loss; damage to urethra, bladder, ureter, kidney; need for multiple surgeries; need for prolonged stent placement as well as general anesthesia risks. Although uncommon he was also informed of the possibility that the calculus may not be able to be treated due to inability to obtain access to the upper ureter. In that event he would require stent placement and a follow-up procedure after a period of stent dilation. All of his questions were answered and he desires to proceed.     Abbie Sons, [place] [address] [place], [place] [address] [phone]

## 2018-02-17 NOTE — Progress Notes (Signed)
02/17/2018 3:03 PM   [name] [birth year] [id number]  Referring provider: Hubbard Hartshorn, [place], [place] [address]  Chief Complaint  Patient presents with  . Nephrolithiasis    HPI: 68 year old male presents for evaluation of a left renal colic.  He has a history of recurrent stone disease.  I last saw him November 2019 and he was doing well.  Approximately 2 weeks ago he had onset of left lower quadrant abdominal pain with some radiation into the left flank region.  There are no identifiable precipitating, aggravating or alleviating factors.  At times his pain is described as severe.  He has nausea without vomiting.  Denies fever or chills.  A CT of the abdomen pelvis ordered by Dr. Vicente Males on 02/13/2018 showed a 8 x 11 mm left proximal ureteral calculus with moderate hydronephrosis/hydroureter.  He has been taking oxycodone with good pain control.   PMH: Past Medical History:  Diagnosis Date  . Abnormal laboratory test    Sees Dr. Ma Hillock  . Acid reflux   . Allergy bee venom   mainly as a child  . Anxiety 12/08/1969  . Arthritis   . Asthma 12/08/1953   mainly as a child  . Chronic back pain   . Colon cancer ([place]) 11/1999   stage 3 s/p colon resection  . Depression   . H/O Clostridium difficile infection   . History of chemotherapy    5-FU pump/leukovorin  . History of kidney stones   . Hypothyroidism   . Insomnia   . Multiple myeloma ([place]) 12/2011   smoldering vs mgus  . Nephrolithiasis 08/28/2017  . Osteoporosis   . Seasonal allergies     Surgical History: Past Surgical History:  Procedure Laterality Date  . BACK SURGERY    . COLON SURGERY  2001   resection, 2nd surgery for scar tissue removal  . COLONOSCOPY    . COLONOSCOPY WITH PROPOFOL N/A 03/26/2016   Procedure: COLONOSCOPY WITH PROPOFOL;  Surgeon: Lollie Sails, MD;  Location: [facility] ENDOSCOPY;  Service: Endoscopy;  Laterality: N/A;  . CYSTOSCOPY/URETEROSCOPY/HOLMIUM  LASER/STENT PLACEMENT Right 09/03/2017   Procedure: CYSTOSCOPY/URETEROSCOPY/HOLMIUM LASER/STENT PLACEMENT;  Surgeon: Abbie Sons, MD;  Location: [facility] ORS;  Service: Urology;  Laterality: Right;  . ESOPHAGOGASTRODUODENOSCOPY  10/2010  . EYE SURGERY  1994 and 2010   RKA in 74 and [place] in 2010  . HERNIA REPAIR  1970   lower left  . LUMBAR FUSION     L4-L5, L5-S1  . SACROILIAC JOINT FUSION    . SHOULDER SURGERY Left   . SPINAL CORD STIMULATOR IMPLANT      Home Medications:  Allergies as of 02/17/2018      Reactions   Bee Venom Swelling   Bees Bees      Medication List       Accurate as of February 17, 2018  3:03 PM. Always use your most recent med list.        celecoxib 200 MG capsule Commonly known as:  CELEBREX Take 1 capsule (200 mg total) by mouth daily.   CENTRUM SILVER ULTRA MENS Tabs Take 1 tablet by mouth daily.   dicyclomine 10 MG capsule Commonly known as:  BENTYL TAKE 1 CAPSULE BY MOUTH TWICE DAILY   NEXIUM 24HR 20 MG capsule Generic drug:  esomeprazole Take 20 mg by mouth daily.   oxyCODONE-acetaminophen 5-325 MG tablet Commonly known as:  PERCOCET Take 1-2 tablets by mouth every 4 (four) hours as  needed for moderate pain or severe pain.   traZODone 100 MG tablet Commonly known as:  DESYREL Take 1 tablet (100 mg total) by mouth at bedtime as needed. for sleep   Vitamin D 50 MCG (2000 UT) Caps Take 1 capsule by mouth daily.       Allergies:  Allergies  Allergen Reactions  . Bee Venom Swelling    Bees Bees    Family History: Family History  Problem Relation Age of Onset  . Lymphoma Mother   . Arthritis Mother        deceased  . Cancer Mother   . Lung cancer Father   . Alcohol abuse Father        deceased  . Cancer Father   . Heart disease Other        grandparents  . Hyperlipidemia Sister   . Diabetes Brother   . Diabetes Son   . Alcohol abuse Brother   . Arthritis Brother   . Depression Brother   . Diabetes Brother   .  Alcohol abuse Sister   . Arthritis Sister   . Depression Sister   . Alcohol abuse Son   . Early death Son        truck accident 58 at age 64    Social History:  reports that he quit smoking about 47 years ago. His smoking use included cigarettes. He has a 10.00 pack-year smoking history. He has never used smokeless tobacco. He reports that he does not drink alcohol or use drugs.  ROS: UROLOGY Frequent Urination?: Yes Hard to postpone urination?: No Burning/pain with urination?: No Get up at night to urinate?: Yes Leakage of urine?: Yes Urine stream starts and stops?: Yes Trouble starting stream?: No Do you have to strain to urinate?: No Blood in urine?: No Urinary tract infection?: No Sexually transmitted disease?: No Injury to kidneys or bladder?: No Painful intercourse?: No Weak stream?: Yes Erection problems?: No Penile pain?: No  Gastrointestinal Nausea?: Yes Vomiting?: No Indigestion/heartburn?: No Diarrhea?: No Constipation?: No  Constitutional Fever: No Night sweats?: Yes Weight loss?: No Fatigue?: Yes  Skin Skin rash/lesions?: No Itching?: Yes  Eyes Blurred vision?: No Double vision?: No  Ears/Nose/Throat Sore throat?: No Sinus problems?: No  Hematologic/Lymphatic Swollen glands?: No Easy bruising?: No  Cardiovascular Leg swelling?: No Chest pain?: No  Respiratory Cough?: No Shortness of breath?: No  Endocrine Excessive thirst?: No  Musculoskeletal Back pain?: Yes Joint pain?: Yes  Neurological Headaches?: No Dizziness?: No  Psychologic Depression?: Yes Anxiety?: Yes  Physical Exam: BP 130/75 (BP Location: Left Arm, Patient Position: Sitting, Cuff Size: Normal)   Pulse (!) 59   Ht _0  (1.702 m)   Wt 130 lb 1.6 oz (59 kg)   BMI 20.38 kg/m   Constitutional:  Alert and oriented, No acute distress. HEENT:  AT, moist mucus membranes.  Trachea midline, no masses. Cardiovascular: No clubbing, cyanosis, or edema.   RRR Respiratory: Normal respiratory effort, no increased work of breathing.  Clear GI: Abdomen is soft, nontender, nondistended, no abdominal masses GU: No CVA tenderness Lymph: No cervical or inguinal lymphadenopathy. Skin: No rashes, bruises or suspicious lesions. Neurologic: Grossly intact, no focal deficits, moving all 4 extremities. Psychiatric: Normal mood and affect.  Laboratory Data:  Urinalysis Dipstick trace leukocyte/microscopy negative  Pertinent Imaging: CT images were personally reviewed   Assessment & Plan:   68 year old male with an 11 mm left proximal ureteral calculus and intermittent renal colic.  He was informed based on stone  size it is unlikely to pass spontaneously.  Management options were discussed including ureteroscopy with laser lithotripsy/stone removal and shockwave lithotripsy.  The pros and cons of each treatment were discussed.  He would like to schedule ureteroscopic removal.  The indications and nature of the planned procedure were discussed as well as the potential  benefits and expected outcome.  Alternatives have been discussed in detail. The most common complications and side effects were discussed including but not limited to infection/sepsis; blood loss; damage to urethra, bladder, ureter, kidney; need for multiple surgeries; need for prolonged stent placement as well as general anesthesia risks. Although uncommon he was also informed of the possibility that the calculus may not be able to be treated due to inability to obtain access to the upper ureter. In that event he would require stent placement and a follow-up procedure after a period of stent dilation. All of his questions were answered and he desires to proceed.     Abbie Sons, [place] [address] [place], [place] [address] [phone]

## 2018-02-19 ENCOUNTER — Encounter
Admission: RE | Admit: 2018-02-19 | Discharge: 2018-02-19 | Disposition: A | Discharge status: Home / Self Care | Payer: Medicare Other | Source: Ambulatory Visit | Attending: Urology | Admitting: Urology

## 2018-02-19 ENCOUNTER — Other Ambulatory Visit: Payer: Self-pay

## 2018-02-19 ENCOUNTER — Telehealth: Payer: Self-pay | Admitting: Urology

## 2018-02-19 NOTE — Telephone Encounter (Signed)
Pt. Request Pain Medication refill.

## 2018-02-19 NOTE — Telephone Encounter (Signed)
Patient is taking percocet every 4 hours and will be out tomorrow.  He request a refill.  Please advise.

## 2018-02-19 NOTE — Patient Instructions (Signed)
Your procedure is scheduled on: 02/21/18 Report to [place] To find out your arrival time please call [phone] between 1PM - 3PM on 02/20/18.  Remember: Instructions that are not followed completely may result in serious medical risk,  up to and including death, or upon the discretion of your surgeon and anesthesiologist your  surgery may need to be rescheduled.     _X__ 1. Do not eat food after midnight the night before your procedure.                 No gum chewing or hard candies. You may drink clear liquids up to 2 hours                 before you are scheduled to arrive for your surgery- DO not drink clear                 liquids within 2 hours of the start of your surgery.                 Clear Liquids include:  water, apple juice without pulp, clear carbohydrate                 drink such as Clearfast of Gatorade, Black Coffee or Tea (Do not add                 anything to coffee or tea).  __X__2.  On the morning of surgery brush your teeth with toothpaste and water, you                may rinse your mouth with mouthwash if you wish.  Do not swallow any toothpaste of mouthwash.     _X__ 3.  No Alcohol for 24 hours before or after surgery.   _X__ 4.  Do Not Smoke or use e-cigarettes For 24 Hours Prior to Your Surgery.                 Do not use any chewable tobacco products for at least 6 hours prior to                 surgery.  ____  5.  Bring all medications with you on the day of surgery if instructed.   _X___  6.  Notify your doctor if there is any change in your medical condition      (cold, fever, infections).     Do not wear jewelry, make-up, hairpins, clips or nail polish. Do not wear lotions, powders, or perfumes. You may wear deodorant. Do not shave 48 hours prior to surgery. Men may shave face and neck. Do not bring valuables to the hospital.    [facility] is not responsible for any belongings or  valuables.  Contacts, dentures or bridgework may not be worn into surgery. Leave your suitcase in the car. After surgery it may be brought to your room. For patients admitted to the hospital, discharge time is determined by your treatment team.   Patients discharged the day of surgery will not be allowed to drive home.     ___X_ Take these medicines the morning of surgery with A SIP OF WATER:    1. OMPRAZOLE AT BEDTIME 02/20/18 AND MORNING OF SURGERY  2.   3.   4.  5.  6.  ____ Fleet Enema (as directed)   ____ Use CHG Soap as directed  ____ Use inhalers on the day of surgery  ____ Stop metformin 2 days prior to surgery    ____ Take 1/2 of usual insulin dose the night before surgery. No insulin the morning          of surgery.   ____ Stop Coumadin/Plavix/aspirin on   ____ Stop Anti-inflammatories on    ____ Stop supplements until after surgery.    ____ Bring C-Pap to the hospital.

## 2018-02-20 MED ORDER — OXYCODONE-ACETAMINOPHEN 5-325 MG PO TABS: 1.0000 | ORAL_TABLET | ORAL | 0 refills | Status: DC | PRN

## 2018-02-20 MED ORDER — CEFAZOLIN SODIUM-DEXTROSE 2-4 GM/100ML-% IV SOLN
2.0000 g | INTRAVENOUS | Status: AC
  Administered 2018-02-21: 2 g via INTRAVENOUS

## 2018-02-20 NOTE — Telephone Encounter (Signed)
Rx sent 

## 2018-02-20 NOTE — Telephone Encounter (Signed)
Please advise on message below.

## 2018-02-20 NOTE — Telephone Encounter (Signed)
Pt left vm about his pain meds. Are they going to be called in prior to surgery?

## 2018-02-21 ENCOUNTER — Ambulatory Visit: Payer: Medicare Other | Admitting: Anesthesiology

## 2018-02-21 ENCOUNTER — Other Ambulatory Visit: Payer: Self-pay

## 2018-02-21 ENCOUNTER — Encounter: Payer: Self-pay | Admitting: Emergency Medicine

## 2018-02-21 ENCOUNTER — Ambulatory Visit
Admission: RE | Admit: 2018-02-21 | Discharge: 2018-02-21 | Disposition: A | Discharge status: Home / Self Care | Payer: Medicare Other | Attending: Urology | Admitting: Urology

## 2018-02-21 ENCOUNTER — Encounter
Admission: RE | Disposition: A | Discharge status: Home / Self Care | Payer: Self-pay | Source: Home / Self Care | Attending: Urology

## 2018-02-21 DIAGNOSIS — N201 Calculus of ureter: Secondary | ICD-10-CM

## 2018-02-21 DIAGNOSIS — N132 Hydronephrosis with renal and ureteral calculous obstruction: Secondary | ICD-10-CM | POA: Insufficient documentation

## 2018-02-21 DIAGNOSIS — E039 Hypothyroidism, unspecified: Secondary | ICD-10-CM | POA: Diagnosis not present

## 2018-02-21 DIAGNOSIS — K219 Gastro-esophageal reflux disease without esophagitis: Secondary | ICD-10-CM | POA: Diagnosis not present

## 2018-02-21 DIAGNOSIS — J45909 Unspecified asthma, uncomplicated: Secondary | ICD-10-CM | POA: Diagnosis not present

## 2018-02-21 DIAGNOSIS — N202 Calculus of kidney with calculus of ureter: Secondary | ICD-10-CM | POA: Diagnosis present

## 2018-02-21 HISTORY — PX: HOLMIUM LASER APPLICATION: SHX5852

## 2018-02-21 HISTORY — PX: CYSTOSCOPY WITH URETEROSCOPY AND STENT PLACEMENT: SHX6377

## 2018-02-21 LAB — CULTURE, URINE COMPREHENSIVE

## 2018-02-21 SURGERY — CYSTOURETEROSCOPY, WITH STENT INSERTION
Anesthesia: General | Laterality: Left

## 2018-02-21 MED ORDER — OXYCODONE-ACETAMINOPHEN 5-325 MG PO TABS
1.0000 | ORAL_TABLET | ORAL | Status: DC | PRN
  Administered 2018-02-21: 1 via ORAL

## 2018-02-21 MED ORDER — PHENYLEPHRINE HCL 10 MG/ML IJ SOLN
INTRAMUSCULAR | Status: DC | PRN
  Administered 2018-02-21: 100 ug via INTRAVENOUS

## 2018-02-21 MED ORDER — TAMSULOSIN HCL 0.4 MG PO CAPS: 0.4000 mg | ORAL_CAPSULE | Freq: Every day | ORAL | 0 refills | Status: DC

## 2018-02-21 MED ORDER — PROPOFOL 10 MG/ML IV BOLUS
INTRAVENOUS | Status: AC
  Filled 2018-02-21: qty 20

## 2018-02-21 MED ORDER — CEFAZOLIN SODIUM-DEXTROSE 2-4 GM/100ML-% IV SOLN
INTRAVENOUS | Status: AC
  Filled 2018-02-21: qty 100

## 2018-02-21 MED ORDER — FENTANYL CITRATE (PF) 100 MCG/2ML IJ SOLN
INTRAMUSCULAR | Status: AC
  Filled 2018-02-21: qty 2

## 2018-02-21 MED ORDER — FENTANYL CITRATE (PF) 100 MCG/2ML IJ SOLN
25.0000 ug | INTRAMUSCULAR | Status: DC | PRN
  Administered 2018-02-21 (×2): 50 ug via INTRAVENOUS

## 2018-02-21 MED ORDER — DEXAMETHASONE SODIUM PHOSPHATE 10 MG/ML IJ SOLN
INTRAMUSCULAR | Status: DC | PRN
  Administered 2018-02-21: 10 mg via INTRAVENOUS

## 2018-02-21 MED ORDER — SUGAMMADEX SODIUM 200 MG/2ML IV SOLN
INTRAVENOUS | Status: DC | PRN
  Administered 2018-02-21: 200 mg via INTRAVENOUS

## 2018-02-21 MED ORDER — MIDAZOLAM HCL 2 MG/2ML IJ SOLN
INTRAMUSCULAR | Status: AC
  Filled 2018-02-21: qty 2

## 2018-02-21 MED ORDER — SEVOFLURANE IN SOLN
RESPIRATORY_TRACT | Status: AC
  Filled 2018-02-21: qty 250

## 2018-02-21 MED ORDER — OXYCODONE-ACETAMINOPHEN 5-325 MG PO TABS
ORAL_TABLET | ORAL | Status: AC
  Filled 2018-02-21: qty 1

## 2018-02-21 MED ORDER — ONDANSETRON HCL 4 MG/2ML IJ SOLN
INTRAMUSCULAR | Status: DC | PRN
  Administered 2018-02-21: 4 mg via INTRAVENOUS

## 2018-02-21 MED ORDER — ONDANSETRON HCL 4 MG/2ML IJ SOLN: 4.0000 mg | Freq: Once | INTRAMUSCULAR | Status: DC | PRN

## 2018-02-21 MED ORDER — IOPAMIDOL (ISOVUE-200) INJECTION 41%
INTRAVENOUS | Status: DC | PRN
  Administered 2018-02-21: 5 mL

## 2018-02-21 MED ORDER — LIDOCAINE HCL (CARDIAC) PF 100 MG/5ML IV SOSY
PREFILLED_SYRINGE | INTRAVENOUS | Status: DC | PRN
  Administered 2018-02-21: 60 mg via INTRAVENOUS

## 2018-02-21 MED ORDER — LACTATED RINGERS IV SOLN
INTRAVENOUS | Status: DC
  Administered 2018-02-21: 06:00:00 via INTRAVENOUS

## 2018-02-21 MED ORDER — ROCURONIUM BROMIDE 100 MG/10ML IV SOLN
INTRAVENOUS | Status: DC | PRN
  Administered 2018-02-21: 50 mg via INTRAVENOUS

## 2018-02-21 MED ORDER — FENTANYL CITRATE (PF) 100 MCG/2ML IJ SOLN
INTRAMUSCULAR | Status: DC | PRN
  Administered 2018-02-21 (×3): 50 ug via INTRAVENOUS

## 2018-02-21 MED ORDER — OXYBUTYNIN CHLORIDE 5 MG PO TABS: ORAL_TABLET | ORAL | 0 refills | Status: DC

## 2018-02-21 MED ORDER — LACTATED RINGERS IV SOLN
INTRAVENOUS | Status: DC | PRN
  Administered 2018-02-21: 08:00:00 via INTRAVENOUS

## 2018-02-21 MED ORDER — ROCURONIUM BROMIDE 50 MG/5ML IV SOLN
INTRAVENOUS | Status: AC
  Filled 2018-02-21: qty 1

## 2018-02-21 MED ORDER — PROPOFOL 10 MG/ML IV BOLUS
INTRAVENOUS | Status: DC | PRN
  Administered 2018-02-21: 170 mg via INTRAVENOUS

## 2018-02-21 MED ORDER — MIDAZOLAM HCL 2 MG/2ML IJ SOLN
INTRAMUSCULAR | Status: DC | PRN
  Administered 2018-02-21: 2 mg via INTRAVENOUS

## 2018-02-21 SURGICAL SUPPLY — 32 items
BAG DRAIN CYSTO-URO LG1000N (MISCELLANEOUS) ×2 IMPLANT
BASKET ZERO TIP 1.9FR (BASKET) ×1 IMPLANT
BRUSH SCRUB EZ 1% IODOPHOR (MISCELLANEOUS) ×2 IMPLANT
CATH URETL 5X70 OPEN END (CATHETERS) IMPLANT
CNTNR SPEC 2.5X3XGRAD LEK (MISCELLANEOUS) ×1
CONRAY 43 FOR UROLOGY 50M (MISCELLANEOUS) ×1 IMPLANT
CONT SPEC 4OZ STER OR WHT (MISCELLANEOUS) ×1
CONTAINER SPEC 2.5X3XGRAD LEK (MISCELLANEOUS) IMPLANT
DRAPE UTILITY 15X26 TOWEL STRL (DRAPES) ×2 IMPLANT
FIBER LASER LITHO 273 (Laser) ×1 IMPLANT
GLIDEWIRE STR 0.035 150CM 3CM (WIRE) ×1 IMPLANT
GLOVE BIO SURGEON STRL SZ8 (GLOVE) ×2 IMPLANT
GOWN STRL REUS W/ TWL LRG LVL3 (GOWN DISPOSABLE) ×1 IMPLANT
GOWN STRL REUS W/ TWL XL LVL3 (GOWN DISPOSABLE) ×1 IMPLANT
GOWN STRL REUS W/TWL LRG LVL3 (GOWN DISPOSABLE) ×2
GOWN STRL REUS W/TWL XL LVL3 (GOWN DISPOSABLE) ×1
INFUSOR MANOMETER BAG 3000ML (MISCELLANEOUS) ×2 IMPLANT
INTRODUCER DILATOR DOUBLE (INTRODUCER) ×1 IMPLANT
KIT TURNOVER CYSTO (KITS) ×2 IMPLANT
PACK CYSTO AR (MISCELLANEOUS) ×2 IMPLANT
SENSORWIRE 0.038 NOT ANGLED (WIRE) ×2
SET CYSTO W/LG BORE CLAMP LF (SET/KITS/TRAYS/PACK) ×2 IMPLANT
SHEATH URETERAL 12FRX35CM (MISCELLANEOUS) IMPLANT
SOL .9 NS 3000ML IRR  AL (IV SOLUTION) ×1
SOL .9 NS 3000ML IRR UROMATIC (IV SOLUTION) ×1 IMPLANT
STENT URET 6FRX24 CONTOUR (STENTS) ×1 IMPLANT
STENT URET 6FRX26 CONTOUR (STENTS) IMPLANT
SURGILUBE 2OZ TUBE FLIPTOP (MISCELLANEOUS) ×2 IMPLANT
SYR 30ML LL (SYRINGE) ×1 IMPLANT
VALVE UROSEAL ADJ ENDO (VALVE) ×1 IMPLANT
WATER STERILE IRR 1000ML POUR (IV SOLUTION) ×2 IMPLANT
WIRE SENSOR 0.038 NOT ANGLED (WIRE) ×1 IMPLANT

## 2018-02-21 NOTE — Anesthesia Post-op Follow-up Note (Signed)
Anesthesia QCDR form completed.        

## 2018-02-21 NOTE — Anesthesia Procedure Notes (Addendum)
Procedure Name: Intubation Date/Time: 02/21/2018 7:49 AM Performed by: Justus Memory, CRNA Pre-anesthesia Checklist: Patient identified, Patient being monitored, Timeout performed, Emergency Drugs available and Suction available Patient Re-evaluated:Patient Re-evaluated prior to induction Oxygen Delivery Method: Circle system utilized Preoxygenation: Pre-oxygenation with 100% oxygen Induction Type: IV induction Ventilation: Mask ventilation without difficulty Laryngoscope Size: Mac and 3 Grade View: Grade I Tube type: Oral Tube size: 7.0 mm Number of attempts: 1 Airway Equipment and Method: Stylet Placement Confirmation: ETT inserted through vocal cords under direct vision,  positive ETCO2 and breath sounds checked- equal and bilateral Secured at: 22 cm Tube secured with: Tape Dental Injury: Teeth and Oropharynx as per pre-operative assessment

## 2018-02-21 NOTE — Discharge Instructions (Signed)
Laser Therapy for Kidney Stones, Care After Refer to this sheet in the next few weeks. These instructions provide you with information on caring for yourself after your procedure. Your health care provider may also give you more specific instructions. Your treatment has been planned according to current medical practices, but problems sometimes occur. Call your health care provider if you have any problems or questions after your procedure. What can I expect after the procedure? After the procedure, it is common to have:  Pain.  A burning sensation while urinating.  Small amounts of blood in your urine.  A need to urinate frequently.  Pieces of kidney stone in your urine.  Mild discomfort when urinating that is felt in the tip of the penis in men or in the back. You may experience this if you have a flexible tube (stent) in your ureter. Follow these instructions at home:  Take over-the-counter and prescription medicines only as told by your health care provider.  Take your antibiotic medicine as told by your health care provider. Do not stop taking the antibiotic even if you start to feel better.  Drink enough fluid to keep your urine clear or pale yellow. Your health care provider may recommend drinking two 8-ounce glasses of water per hour for a few hours after your procedure.  Return to your normal activities as told by your health care provider. Ask your health care provider what activities are safe for you.  If your health care provider approves, you may take a warm bath to ease discomfort and burning.  Do notdrivefor 24 hours if you were given a medicine to help you relax (sedative).  Keep all follow-up visits as told by your health care provider. This is important. If you have a stent, you will need to return to your health care provider to have the stent removed. Contact a health care provider if:  You have pain or a burning feeling that lasts more than two days.  You  feel nauseous.  You vomit more and more often.  You have difficulty urinating.  You have pain that gets worse or does not get better with medicine. Get help right away if:  You are unable to urinate, even if your bladder feels full.  You have bright red blood or blood clots in your urine.  You have more blood in your urine.  You have severe pain or discomfort.  You have a fever or shaking chills.  You have abdominal pain. This information is not intended to replace advice given to you by your health care provider. Make sure you discuss any questions you have with your health care provider. Document Released: 02/18/2015 Document Revised: 06/30/2015 Document Reviewed: 12/16/2014 Elsevier Interactive Patient Education  2019 [place]   1) The drugs that you were given will stay in your system until tomorrow so for the next 24 hours you should not:  A) Drive an automobile B) Make any legal decisions C) Drink any alcoholic beverage   2) You may resume regular meals tomorrow.  Today it is better to start with liquids and gradually work up to solid foods.  You may eat anything you prefer, but it is better to start with liquids, then soup and crackers, and gradually work up to solid foods.   3) Please notify your doctor immediately if you have any unusual bleeding, trouble breathing, redness and pain at the surgery site, drainage, fever, or pain not relieved by medication.  4) Additional Instructions:  Drink plenty of decaffeinate liquids   Please contact your physician with any problems or Same Day Surgery at [phone], Monday through Friday 6 am to 4 pm, or [place] at [facility] number at [phone].

## 2018-02-21 NOTE — Transfer of Care (Signed)
Immediate Anesthesia Transfer of Care Note  Patient: SIM CHOQUETTE  Procedure(s) Performed: URETEROSCOPY, LASER LITHOTRIPSY, STONE REMOVAL AND STENT PLACEMENT (Left ) HOLMIUM LASER APPLICATION (Left )  Patient Location: PACU  Anesthesia Type:General  Level of Consciousness: sedated  Airway & Oxygen Therapy: Patient Spontanous Breathing and Patient connected to face mask oxygen  Post-op Assessment: Report given to RN and Post -op Vital signs reviewed and stable  Post vital signs: Reviewed and stable  Last Vitals:  Vitals Value Taken Time  BP 159/82 02/21/2018  9:18 AM  Temp    Pulse 66 02/21/2018  9:21 AM  Resp 13 02/21/2018  9:21 AM  SpO2 99 % 02/21/2018  9:21 AM  Vitals shown include unvalidated device data.  Last Pain:  Vitals:   02/21/18 0918  TempSrc:   PainSc: 0-No pain         Complications: No apparent anesthesia complications

## 2018-02-21 NOTE — Anesthesia Preprocedure Evaluation (Signed)
Anesthesia Evaluation  Patient identified by MRN, date of birth, ID band Patient awake    Reviewed: Allergy & Precautions, H&P , NPO status , Patient's Chart, lab work & pertinent test results, reviewed documented beta blocker date and time   Airway Mallampati: II  TM Distance: >3 FB Neck ROM: full    Dental  (+) Teeth Intact   Pulmonary neg pulmonary ROS, asthma , former smoker,    Pulmonary exam normal        Cardiovascular Exercise Tolerance: Good negative cardio ROS Normal cardiovascular exam Rhythm:regular Rate:Normal     Neuro/Psych PSYCHIATRIC DISORDERS Anxiety Depression negative neurological ROS  negative psych ROS   GI/Hepatic negative GI ROS, Neg liver ROS, GERD  ,  Endo/Other  negative endocrine ROSHypothyroidism   Renal/GU Renal diseasenegative Renal ROS  negative genitourinary   Musculoskeletal   Abdominal   Peds  Hematology negative hematology ROS (+)   Anesthesia Other Findings Past Medical History: No date: Abnormal laboratory test     Comment:  Sees Dr. Ma [place] No date: Acid reflux bee venom: Allergy     Comment:  mainly as a child 12/08/1969: Anxiety No date: Arthritis 12/08/1953: Asthma     Comment:  mainly as a child No date: Chronic back pain 11/1999: Colon cancer ([place])     Comment:  stage 3 s/p colon resection No date: Depression No date: H/O Clostridium difficile infection No date: History of chemotherapy     Comment:  5-FU pump/leukovorin No date: History of kidney stones No date: Hypothyroidism     Comment:  NO MEDS NOW No date: Insomnia 12/2011: Multiple myeloma ([place])     Comment:  smoldering vs mgus 08/28/2017: Nephrolithiasis No date: Osteoporosis No date: Seasonal allergies Past Surgical History: No date: BACK SURGERY 2001: COLON SURGERY     Comment:  resection, 2nd surgery for scar tissue removal No date: COLONOSCOPY 03/26/2016: COLONOSCOPY WITH PROPOFOL; N/A  Comment:  Procedure: COLONOSCOPY WITH PROPOFOL;  Surgeon: Lollie Sails, MD;  Location: [facility] ENDOSCOPY;  Service:               Endoscopy;  Laterality: N/A; 09/03/2017: CYSTOSCOPY/URETEROSCOPY/HOLMIUM LASER/STENT PLACEMENT;  Right     Comment:  Procedure: CYSTOSCOPY/URETEROSCOPY/HOLMIUM LASER/STENT               PLACEMENT;  Surgeon: Abbie Sons, MD;  Location:               [facility] ORS;  Service: Urology;  Laterality: Right; 10/2010: ESOPHAGOGASTRODUODENOSCOPY 1994 and 2010: EYE SURGERY     Comment:  RKA in 89 and [place] in 2010 1970: HERNIA REPAIR     Comment:  lower left No date: LUMBAR FUSION     Comment:  L4-L5, L5-S1 No date: SACROILIAC JOINT FUSION No date: SHOULDER SURGERY; Left No date: SPINAL CORD STIMULATOR IMPLANT BMI    Body Mass Index:  20.38 kg/m     Reproductive/Obstetrics negative OB ROS                             Anesthesia Physical Anesthesia Plan  ASA: III  Anesthesia Plan: General ETT   Post-op Pain Management:    Induction:   PONV Risk Score and Plan:   Airway Management Planned:   Additional Equipment:   Intra-op Plan:   Post-operative Plan:   Informed Consent: I have reviewed the patients History and Physical,  chart, labs and discussed the procedure including the risks, benefits and alternatives for the proposed anesthesia with the patient or authorized representative who has indicated his/her understanding and acceptance.     Dental Advisory Given  Plan Discussed with: CRNA  Anesthesia Plan Comments:         Anesthesia Quick Evaluation

## 2018-02-21 NOTE — Interval H&P Note (Signed)
History and Physical Interval Note:  02/21/2018 7:17 AM  [name]  has presented today for surgery, with the diagnosis of LEFT URETERAL CALCULUS  The various methods of treatment have been discussed with the patient and family. After consideration of risks, benefits and other options for treatment, the patient has consented to  Procedure(s): URETEROSCOPY, LASER LITHOTRIPSY, STONE REMOVAL AND STENT PLACEMENT (Left) HOLMIUM LASER APPLICATION (Left) as a surgical intervention .  The patient's history has been reviewed, patient examined, no change in status, stable for surgery.  I have reviewed the patient's chart and labs.  Questions were answered to the patient's satisfaction.     [place]

## 2018-02-23 NOTE — Op Note (Signed)
Preoperative diagnosis:  1.  Left proximal ureteral calculus 2.  Left nephrolithiasis  Postoperative diagnosis:  Same  Procedure: 1. Cystoscopy 2. Left ureteroscopy and stone removal 3. Ureteroscopic laser lithotripsy 4. Left ureteral stent placement (6 FR) 24 cm 5. Left retrograde pyelography with interpretation  Surgeon: Nicki Reaper C. Keylie Beavers, M.D.  Anesthesia: General  Complications: None  Intraoperative findings:  1.  Left retrograde pyelography post procedure showed no filling defects, stone fragments or contrast extravasation  EBL: Minimal  Specimens: 1. Calculus fragments for analysis   Indication: [name] is a 68 y.o. year old patient with renal colic secondary to an 11 mm left proximal ureteral calculus with obstruction.  He also has small, nonobstructing left renal calculi.  After reviewing the management options for treatment, the patient elected to proceed with the above surgical procedure(s). We have discussed the potential benefits and risks of the procedure, side effects of the proposed treatment, the likelihood of the patient achieving the goals of the procedure, and any potential problems that might occur during the procedure or recuperation. Informed consent has been obtained.  Description of procedure:  The patient was taken to the operating room and general anesthesia was induced.  The patient was placed in the dorsal lithotomy position, prepped and draped in the usual sterile fashion, and preoperative antibiotics were administered. A preoperative time-out was performed.   A 22 French cystoscope was lubricated and passed under direct vision.  The urethra was normal in caliber without stricture.  The prostate demonstrated moderate lateral lobe enlargement.  Panendoscopy was performed and the bladder mucosa showed no erythema, solid or papillary lesions.  Attention was directed to the left ureteral orifice and a 0.038 Sensor wire was then advanced up the ureter  into the renal pelvis under fluoroscopic guidance.  The cystoscope was removed and a dual-lumen catheter was placed over the sensor wire and a second sensor wire was placed in a similar fashion.  A digital flexible ureteroscope was placed over the working wire and advanced up the ureter without difficulty.  The calculus was identified in the proximal ureter.  The working wire was removed.  A 273 micron holmium laser fiber was placed through the ureteroscope and the stone was dusted at a setting of 0.2 J and frequency of 40 hz.   There was migration of the calculus into a midpole calyx and the stone was further dusted in the kidney.  And was dusted down to a 5 mm fragment which was then grasped with a 1.9 [place] nitinol basket and dropped into the bladder.  The ureteroscope was repassed without difficulty.  Retrograde pyelogram was performed and each calyx was sequentially examined under fluoroscopic guidance.  2 additional calculi were dusted and any significantly sized fragments were further dusted until no significantly size fragments remained.  The ureteroscope was withdrawn and no fragments were notified within the ureter.  A 6 FR/24 CM stent was placed under fluoroscopic guidance.  The wire was then removed with an adequate stent curl noted in the renal pelvis as well as in the bladder.  The cystoscope was repassed and stone fragments were irrigated from the bladder.  The cystoscope was then removed.  After anesthetic reversal the patient was transported to the PACU in stable condition.   Christopher Giovanni, MD

## 2018-02-24 ENCOUNTER — Ambulatory Visit: Payer: Medicare Other | Admitting: Gastroenterology

## 2018-02-27 NOTE — Anesthesia Postprocedure Evaluation (Signed)
Anesthesia Post Note  Patient: [name]  Procedure(s) Performed: URETEROSCOPY, LASER LITHOTRIPSY, STONE REMOVAL AND STENT PLACEMENT (Left ) HOLMIUM LASER APPLICATION (Left )  Patient location during evaluation: PACU Anesthesia Type: General Level of consciousness: awake and alert Pain management: pain level controlled Vital Signs Assessment: post-procedure vital signs reviewed and stable Respiratory status: spontaneous breathing, nonlabored ventilation, respiratory function stable and patient connected to nasal cannula oxygen Cardiovascular status: blood pressure returned to baseline and stable Postop Assessment: no apparent nausea or vomiting Anesthetic complications: no     Last Vitals:  Vitals:   02/21/18 1017 02/21/18 1047  BP: (!) 165/83 (!) 147/74  Pulse: 62 73  Resp: 16 16  Temp: (!) 36.4 C   SpO2: 100% 98%    Last Pain:  Vitals:   02/24/18 0814  TempSrc:   PainSc: 5                  Molli Barrows

## 2018-03-03 ENCOUNTER — Ambulatory Visit (INDEPENDENT_AMBULATORY_CARE_PROVIDER_SITE_OTHER): Payer: Medicare Other | Admitting: Family Medicine

## 2018-03-03 ENCOUNTER — Encounter: Payer: Self-pay | Admitting: Gastroenterology

## 2018-03-03 ENCOUNTER — Encounter: Payer: Self-pay | Admitting: Family Medicine

## 2018-03-03 VITALS — BP 120/80 | HR 96 | Temp 98.6°F | Resp 16 | Ht 67.0 in | Wt 133.2 lb

## 2018-03-03 DIAGNOSIS — M1990 Unspecified osteoarthritis, unspecified site: Secondary | ICD-10-CM | POA: Diagnosis not present

## 2018-03-03 DIAGNOSIS — Z85038 Personal history of other malignant neoplasm of large intestine: Secondary | ICD-10-CM

## 2018-03-03 DIAGNOSIS — K219 Gastro-esophageal reflux disease without esophagitis: Secondary | ICD-10-CM

## 2018-03-03 DIAGNOSIS — Z9689 Presence of other specified functional implants: Secondary | ICD-10-CM | POA: Insufficient documentation

## 2018-03-03 DIAGNOSIS — F411 Generalized anxiety disorder: Secondary | ICD-10-CM

## 2018-03-03 DIAGNOSIS — N200 Calculus of kidney: Secondary | ICD-10-CM | POA: Diagnosis not present

## 2018-03-03 DIAGNOSIS — G4700 Insomnia, unspecified: Secondary | ICD-10-CM | POA: Diagnosis not present

## 2018-03-03 DIAGNOSIS — R109 Unspecified abdominal pain: Secondary | ICD-10-CM | POA: Diagnosis not present

## 2018-03-03 DIAGNOSIS — F324 Major depressive disorder, single episode, in partial remission: Secondary | ICD-10-CM

## 2018-03-03 DIAGNOSIS — Z13220 Encounter for screening for lipoid disorders: Secondary | ICD-10-CM | POA: Diagnosis not present

## 2018-03-03 DIAGNOSIS — C9000 Multiple myeloma not having achieved remission: Secondary | ICD-10-CM

## 2018-03-03 DIAGNOSIS — D472 Monoclonal gammopathy: Secondary | ICD-10-CM

## 2018-03-03 MED ORDER — DICYCLOMINE HCL 10 MG PO CAPS: 10.0000 mg | ORAL_CAPSULE | Freq: Two times a day (BID) | ORAL | 0 refills | Status: DC | PRN

## 2018-03-03 MED ORDER — CELECOXIB 200 MG PO CAPS: 200.0000 mg | ORAL_CAPSULE | Freq: Every day | ORAL | 1 refills | Status: DC

## 2018-03-03 NOTE — Progress Notes (Signed)
Name: [name]   MRN: [id number]    DOB: 30-Oct-1950   Date:03/03/2018       Progress Note  Subjective  Chief Complaint  Chief Complaint  Patient presents with  . Follow-up  . Medication Refill    HPI  Nephrolithiasis: seeing Dr. Bernardo Heater, had lithotripsy with stent placement on 02/21/2018.  Has appt in a few days to have stent removed. His pain is much better on the LEFT-side abdomen now.  Had RIGHT sided lithotripsy done in July 2019.    GERD: takes esomeprazole 31m daily; food triggers- spicy foods but still eats them. Take a pepcid before bed every night.  We have discussed risks of long-term risk of PPI's.  Saw Dr. AVicente Malessince last visit. Otherwise no changes.   History Colon Cancer/Abdominal Cramping: Resection/surgery - November 22, 1999. Has chronic abdominal spasms treated with bentyl.  He was recommended to have colonoscopy follow up every 3 years - had last colonoscopy in 2018. Saw Dr. AVicente Malessince our last visit - was told to follow up as needed and for colonoscopy.  Smoldering Multiple Myeloma:Dr. BRogue Bussinglast seen on 01/24/18 - found in 2013.  Recent visit states no clinical evidence of progression; no meds on surveillance  Arthritis: Neck, lower back, and bilateral thumbs - plays golf and bowls regularly. Has been on celebrex daily for many years and does well with this. Kidney function was normal in December 2019. Has spinal cord stimulator in place since 2005, and it is working well for him still.   MDD &Anxiety & Insomnia:was on klonopin 4in the past; trialed on lexapro and caused GI upset.States has dealt with depression and anxiety for a long time- has lost children in the past and it has been hard for him to deal with it.  Trialed buspar, and this did not work for him.  Taking Trazodone for sleep and does not want to add any other medications at this time.  Takes trazadone 1064mvery evening- average 8 hours of sleep a night; medicines help him get to sleep  faster and sleep longer  Patient Active Problem List   Diagnosis Date Noted  . Spinal cord stimulator status 03/03/2018  . Major depressive disorder in partial remission ([place]01/27/2020  . Ureteral calculus 08/28/2017  . Renal colic 0702/58/5277. Nephrolithiasis 08/28/2017  . History of colon cancer 08/19/2017  . OP (osteoporosis) 04/19/2017  . Abdominal spasms 01/16/2016  . Smoldering multiple myeloma (SMM) ([place]12/05/2015  . Arthritis 05/18/2015  . Hyperglycemia 02/16/2015  . Anxiety disorder 10/04/2014  . Allergic rhinitis 10/04/2014  . Gastroesophageal reflux disease without esophagitis 10/04/2014  . Insomnia 09/06/2014    Past Surgical History:  Procedure Laterality Date  . BACK SURGERY    . COLON SURGERY  2001   resection, 2nd surgery for scar tissue removal  . COLONOSCOPY    . COLONOSCOPY WITH PROPOFOL N/A 03/26/2016   Procedure: COLONOSCOPY WITH PROPOFOL;  Surgeon: MaLollie SailsMD;  [facility];  Service: Endoscopy;  Laterality: N/A;  . CYSTOSCOPY WITH URETEROSCOPY AND STENT PLACEMENT Left 02/21/2018   Procedure: URETEROSCOPY, LASER LITHOTRIPSY, STONE REMOVAL AND STENT PLACEMENT;  Surgeon: StAbbie SonsMD;  Location: [facility];  Service: Urology;  Laterality: Left;  . CYSTOSCOPY/URETEROSCOPY/HOLMIUM LASER/STENT PLACEMENT Right 09/03/2017   Procedure: CYSTOSCOPY/URETEROSCOPY/HOLMIUM LASER/STENT PLACEMENT;  Surgeon: StAbbie SonsMD;  Location: [facility] OR[facility];  Service: Urology;  Laterality: Right;  . ESOPHAGOGASTRODUODENOSCOPY  10/2010  . [place]  [place] in 58 and [place] in 2010  . HERNIA REPAIR  1970   lower left  . HOLMIUM LASER APPLICATION Left 0/25/4270   Procedure: HOLMIUM LASER APPLICATION;  Surgeon: Abbie Sons, MD;  Location: [facility] ORS;  Service: Urology;  Laterality: Left;  . LUMBAR FUSION     L4-L5, L5-S1  . SACROILIAC JOINT FUSION    . SHOULDER SURGERY Left   . SPINAL CORD STIMULATOR IMPLANT      Family History    Problem Relation Age of Onset  . Lymphoma Mother   . Arthritis Mother        deceased  . Cancer Mother   . Lung cancer Father   . Alcohol abuse Father        deceased  . Cancer Father   . Heart disease Other        grandparents  . Hyperlipidemia Sister   . Diabetes Brother   . Diabetes Son   . Alcohol abuse Brother   . Arthritis Brother   . Depression Brother   . Diabetes Brother   . Alcohol abuse Sister   . Arthritis Sister   . Depression Sister   . Alcohol abuse Son   . Early death Son        truck accident 53 at age 10    Social History   Socioeconomic History  . Marital status: Married    Spouse name: Not on file  . Number of children: 2  . Years of education: Not on file  . Highest education level: High school graduate  Occupational History  . Occupation: Retired  Scientific laboratory technician  . Financial resource strain: Not hard at all  . Food insecurity:    Worry: Never true    Inability: Never true  . Transportation needs:    Medical: No    Non-medical: No  Tobacco Use  . Smoking status: Former Smoker    Packs/day: 1.00    Years: 10.00    Pack years: 10.00    Types: Cigarettes    Last attempt to quit: 03/06/1970    Years since quitting: 48.0  . Smokeless tobacco: Never Used  . Tobacco comment: started at age 67 and quit at age 62  Substance and Sexual Activity  . Alcohol use: No    Frequency: Never  . Drug use: No  . Sexual activity: Yes    Birth control/protection: None  Lifestyle  . Physical activity:    Days per week: 4 days    Minutes per session: 60 min  . Stress: Not at all  Relationships  . Social connections:    Talks on phone: More than three times a week    Gets together: More than three times a week    Attends religious service: Never    Active member of club or organization: Yes    Attends meetings of clubs or organizations: Never    Relationship status: Married  . Intimate partner violence:    Fear of current or ex partner: No     Emotionally abused: No    Physically abused: No    Forced sexual activity: No  Other Topics Concern  . Not on file  Social History Narrative   Pt is an avid golfer and bowls every week     Current Outpatient Medications:  .  celecoxib (CELEBREX) 200 MG capsule, Take 1 capsule (200 mg total) by mouth daily., Disp: 90 capsule, Rfl: 1 .  cholecalciferol (VITAMIN D3) 25 MCG (1000 UT)  tablet, Take 1,000 Units by mouth daily. , Disp: , Rfl:  .  dicyclomine (BENTYL) 10 MG capsule, Take 1 capsule (10 mg total) by mouth 2 (two) times daily as needed for spasms., Disp: 180 capsule, Rfl: 0 .  Multiple Vitamins-Minerals (CENTRUM SILVER ULTRA MENS) TABS, Take 1 tablet by mouth daily., Disp: , Rfl:  .  omeprazole (PRILOSEC) 40 MG capsule, Take 40 mg by mouth daily., Disp: , Rfl:  .  traZODone (DESYREL) 100 MG tablet, Take 1 tablet (100 mg total) by mouth at bedtime as needed. for sleep (Patient taking differently: Take 100 mg by mouth at bedtime. ), Disp: 90 tablet, Rfl: 1  Allergies  Allergen Reactions  . Bee Venom Swelling    I personally reviewed active problem list, medication list, allergies, notes from last encounter, lab results with the patient/caregiver today.  ROS  Constitutional: Negative for fever or weight change.  Respiratory: Negative for cough and shortness of breath.   Cardiovascular: Negative for chest pain or palpitations.  Gastrointestinal: Negative for abdominal pain, no bowel changes.  Musculoskeletal: Negative for gait problem or joint swelling.  Skin: Negative for rash.  Neurological: Negative for dizziness or headache.  No other specific complaints in a complete review of systems (except as listed in HPI above).   Objective  Vitals:   03/03/18 1401  BP: 120/80  Pulse: 96  Resp: 16  Temp: 98.6 F (37 C)  TempSrc: Oral  SpO2: 97%  Weight: 133 lb 3.2 oz (60.4 kg)  Height: 5' 7"  (1.702 m)    Body mass index is 20.86 kg/m.  Physical Exam  Constitutional:  Patient appears well-developed and well-nourished. No distress.  HENT: Head: Normocephalic and atraumatic. Ears: bilateral TMs with no erythema or effusion; Nose: Nose normal. Mouth/Throat: Oropharynx is clear and moist. No oropharyngeal exudate or tonsillar swelling.  Eyes: Conjunctivae and EOM are normal. No scleral icterus.  Pupils are equal, round, and reactive to light.  Neck: Normal range of motion. Neck supple. No JVD present. No thyromegaly present.  Cardiovascular: Normal rate, regular rhythm and normal heart sounds.  No murmur heard. No BLE edema. Pulmonary/Chest: Effort normal and breath sounds normal. No respiratory distress. Abdominal: Soft. Bowel sounds are normal, no distension. There is no tenderness. No masses. Musculoskeletal: Normal range of motion, no joint effusions. No gross deformities Neurological: Pt is alert and oriented to person, place, and time. No cranial nerve deficit. Coordination, balance, strength, speech and gait are normal.  Skin: Skin is warm and dry. No rash noted. No erythema.  Psychiatric: Patient has a normal mood and affect. behavior is normal. Judgment and thought content normal.  No results found for this or any previous visit (from the past 72 hour(s)).   PHQ2/9: Depression screen [facility] 2/9 03/03/2018 01/10/2018 11/25/2017 08/23/2017 08/19/2017  Decreased Interest 0 0 0 0 1  Down, Depressed, Hopeless 0 3 0 0 2  PHQ - 2 Score 0 3 0 0 3  Altered sleeping 0 1 1 1 3   Tired, decreased energy 0 0 0 1 2  Change in appetite 0 0 0 1 1  Feeling bad or failure about yourself  0 0 0 - 0  Trouble concentrating 0 0 0 0 0  Moving slowly or fidgety/restless 0 0 0 0 2  Suicidal thoughts 0 0 0 0 0  PHQ-9 Score 0 4 1 3 11   Difficult doing work/chores Not difficult at all - Not difficult at all Not difficult at all Not difficult at all  Some recent data might be hidden   Fall Risk: Fall Risk  03/03/2018 01/10/2018 11/25/2017 08/23/2017 08/19/2017  Falls in the past  year? 0 0 No No No  Number falls in past yr: 0 0 - - -  Injury with Fall? 0 - - - -  Follow up Falls evaluation completed - - - -   Assessment & Plan  1. Abdominal spasms - Stable - dicyclomine (BENTYL) 10 MG capsule; Take 1 capsule (10 mg total) by mouth 2 (two) times daily as needed for spasms.  Dispense: 180 capsule; Refill: 0  2. Nephrolithiasis - Keep follow up with Dr. Bernardo Heater  3. Gastroesophageal reflux disease without esophagitis - Continue PPI therapy.   4. History of colon cancer - Due for colonoscopy in 2023  5. Smoldering multiple myeloma (SMM) ([place]) - Continue Oncology follow up.  6. Arthritis - celecoxib (CELEBREX) 200 MG capsule; Take 1 capsule (200 mg total) by mouth daily.  Dispense: 90 capsule; Refill: 1  7. Spinal cord stimulator status - Stable  8. Major depressive disorder in partial remission, unspecified whether recurrent (HCC) - Stable - does not want medication at this time  9. Insomnia, unspecified type - Taking trazodone  10. Generalized anxiety disorder - Stable - does not want medication at this time  11. Lipid screening - Lipid panel

## 2018-03-06 ENCOUNTER — Ambulatory Visit (INDEPENDENT_AMBULATORY_CARE_PROVIDER_SITE_OTHER): Payer: Medicare Other | Admitting: Urology

## 2018-03-06 ENCOUNTER — Encounter: Payer: Self-pay | Admitting: Urology

## 2018-03-06 VITALS — BP 153/80 | HR 60 | Ht 67.0 in | Wt 130.8 lb

## 2018-03-06 DIAGNOSIS — Z9889 Other specified postprocedural states: Secondary | ICD-10-CM

## 2018-03-06 DIAGNOSIS — N202 Calculus of kidney with calculus of ureter: Secondary | ICD-10-CM | POA: Diagnosis not present

## 2018-03-06 DIAGNOSIS — N201 Calculus of ureter: Secondary | ICD-10-CM | POA: Diagnosis not present

## 2018-03-06 LAB — URINALYSIS, COMPLETE
Bilirubin, UA: NEGATIVE
Glucose, UA: NEGATIVE
Ketones, UA: NEGATIVE
Leukocytes, UA: NEGATIVE
NITRITE UA: NEGATIVE
PH UA: 5.5 (ref 5.0–7.5)
Specific Gravity, UA: >1.03 — ABNORMAL HIGH (ref 1.005–1.030)
Urobilinogen, Ur: 0.2 mg/dL (ref 0.2–1.0)

## 2018-03-06 LAB — MICROSCOPIC EXAMINATION
EPITHELIAL CELLS (NON RENAL): NONE SEEN /HPF (ref 0–10)
RBC, UA: >30 /hpf — ABNORMAL HIGH (ref 0–2)
WBC, UA: NONE SEEN /hpf (ref 0–5)

## 2018-03-06 NOTE — Progress Notes (Signed)
Indications: Patient is 68 y.o., male who recently underwent ureteroscopic removal of an 11 mm left proximal ureteral calculus and small left renal calculi.  He had no postoperative problems.  The patient is presenting today for stent removal.  Procedure:  Flexible Cystoscopy with stent removal (65681)  Timeout was performed and the correct patient, procedure and participants were identified.    Description:  The patient was prepped and draped in the usual sterile fashion. Flexible cystosopy was performed.  The stent was visualized, grasped, and removed intact without difficulty. The patient tolerated the procedure well.  A single dose of oral antibiotics was given.  Complications:  None  Plan:  Instructed to call for flank pain/renal colic post stent removal He has declined a metabolic evaluation Return in 3 months with a KUB.   [name], MD

## 2018-03-07 ENCOUNTER — Encounter: Payer: Self-pay | Admitting: Urology

## 2018-03-10 LAB — STONE ANALYSIS
Ca Oxalate,Dihydrate: 3 %
Ca Oxalate,Monohydr.: 97 %
Stone Weight KSTONE: 16.2 mg

## 2018-04-28 ENCOUNTER — Other Ambulatory Visit: Payer: Self-pay | Admitting: Family Medicine

## 2018-05-22 ENCOUNTER — Other Ambulatory Visit: Payer: Self-pay | Admitting: Urology

## 2018-05-22 DIAGNOSIS — Z87442 Personal history of urinary calculi: Secondary | ICD-10-CM

## 2018-06-03 ENCOUNTER — Other Ambulatory Visit: Payer: Self-pay | Admitting: Nurse Practitioner

## 2018-06-03 DIAGNOSIS — F5101 Primary insomnia: Secondary | ICD-10-CM

## 2018-06-12 ENCOUNTER — Ambulatory Visit: Payer: Medicare Other | Admitting: Urology

## 2018-07-03 ENCOUNTER — Ambulatory Visit: Payer: Medicare Other | Admitting: Family Medicine

## 2018-07-03 ENCOUNTER — Ambulatory Visit: Payer: Self-pay | Admitting: Family Medicine

## 2018-07-03 NOTE — Chronic Care Management (AMB) (Signed)
Chronic Care Management   Note  07/03/2018 Name: [name] MRN: [id number] DOB: [birth year]  [name] is a 68 y.o. year old male who is a primary care patient of Hubbard Hartshorn, FNP. I reached out to [name] by phone today in response to a referral sent by Mr. Lakyn Mantione Gibbs's health plan.    Mr. Ledo was given information about Chronic Care Management services today including:  1. CCM service includes personalized support from designated clinical staff supervised by his physician, including individualized plan of care and coordination with other care providers 2. 24/7 contact phone numbers for assistance for urgent and routine care needs. 3. Service will only be billed when office clinical staff spend 20 minutes or more in a month to coordinate care. 4. Only one practitioner may furnish and bill the service in a calendar month. 5. The patient may stop CCM services at any time (effective at the end of the month) by phone call to the office staff. 6. The patient will be responsible for cost sharing (co-pay) of up to 20% of the service fee (after annual deductible is met).  Patient agreed to services and verbal consent obtained.   Follow up plan: Telephone appointment with CCM team member scheduled for: 07/07/2018  Marston  ??[email or web address]   ??[phone]

## 2018-07-06 ENCOUNTER — Emergency Department: Payer: Medicare Other

## 2018-07-06 ENCOUNTER — Encounter: Admission: EM | Disposition: A | Discharge status: Home / Self Care | Payer: Self-pay | Source: Home / Self Care

## 2018-07-06 ENCOUNTER — Ambulatory Visit
Admission: EM | Admit: 2018-07-06 | Discharge: 2018-07-06 | Disposition: A | Discharge status: Home / Self Care | Payer: Medicare Other | Attending: Internal Medicine | Admitting: Internal Medicine

## 2018-07-06 ENCOUNTER — Emergency Department: Payer: Medicare Other | Admitting: Anesthesiology

## 2018-07-06 ENCOUNTER — Other Ambulatory Visit: Payer: Self-pay

## 2018-07-06 DIAGNOSIS — G4700 Insomnia, unspecified: Secondary | ICD-10-CM | POA: Diagnosis not present

## 2018-07-06 DIAGNOSIS — Z791 Long term (current) use of non-steroidal anti-inflammatories (NSAID): Secondary | ICD-10-CM | POA: Insufficient documentation

## 2018-07-06 DIAGNOSIS — X58XXXA Exposure to other specified factors, initial encounter: Secondary | ICD-10-CM | POA: Diagnosis not present

## 2018-07-06 DIAGNOSIS — T185XXA Foreign body in anus and rectum, initial encounter: Secondary | ICD-10-CM | POA: Insufficient documentation

## 2018-07-06 DIAGNOSIS — Z85038 Personal history of other malignant neoplasm of large intestine: Secondary | ICD-10-CM | POA: Insufficient documentation

## 2018-07-06 DIAGNOSIS — Z9221 Personal history of antineoplastic chemotherapy: Secondary | ICD-10-CM | POA: Diagnosis not present

## 2018-07-06 DIAGNOSIS — Z87891 Personal history of nicotine dependence: Secondary | ICD-10-CM | POA: Insufficient documentation

## 2018-07-06 DIAGNOSIS — K641 Second degree hemorrhoids: Secondary | ICD-10-CM | POA: Diagnosis not present

## 2018-07-06 DIAGNOSIS — K219 Gastro-esophageal reflux disease without esophagitis: Secondary | ICD-10-CM | POA: Insufficient documentation

## 2018-07-06 DIAGNOSIS — Z9049 Acquired absence of other specified parts of digestive tract: Secondary | ICD-10-CM | POA: Insufficient documentation

## 2018-07-06 DIAGNOSIS — R109 Unspecified abdominal pain: Secondary | ICD-10-CM | POA: Diagnosis not present

## 2018-07-06 DIAGNOSIS — Z8579 Personal history of other malignant neoplasms of lymphoid, hematopoietic and related tissues: Secondary | ICD-10-CM | POA: Diagnosis not present

## 2018-07-06 HISTORY — PX: FLEXIBLE SIGMOIDOSCOPY: SHX5431

## 2018-07-06 LAB — CBC
HCT: 43.8 % (ref 39.0–52.0)
Hemoglobin: 15.3 g/dL (ref 13.0–17.0)
MCH: 31.9 pg (ref 26.0–34.0)
MCHC: 34.9 g/dL (ref 30.0–36.0)
MCV: 91.3 fL (ref 80.0–100.0)
Platelets: 364 10*3/uL (ref 150–400)
RBC: 4.8 MIL/uL (ref 4.22–5.81)
RDW: 11.9 % (ref 11.5–15.5)
WBC: 9.7 10*3/uL (ref 4.0–10.5)
nRBC: 0 % (ref 0.0–0.2)

## 2018-07-06 LAB — BASIC METABOLIC PANEL
Anion gap: 10 (ref 5–15)
BUN: 33 mg/dL — ABNORMAL HIGH (ref 8–23)
CO2: 25 mmol/L (ref 22–32)
Calcium: 9.4 mg/dL (ref 8.9–10.3)
Chloride: 102 mmol/L (ref 98–111)
Creatinine, Ser: 1.26 mg/dL — ABNORMAL HIGH (ref 0.61–1.24)
GFR calc Af Amer: >60 mL/min (ref >60)
GFR calc non Af Amer: 58 mL/min — ABNORMAL LOW (ref >60)
Glucose, Bld: 136 mg/dL — ABNORMAL HIGH (ref 70–99)
Potassium: 4 mmol/L (ref 3.5–5.1)
Sodium: 137 mmol/L (ref 135–145)

## 2018-07-06 SURGERY — SIGMOIDOSCOPY, FLEXIBLE
Anesthesia: General

## 2018-07-06 MED ORDER — PROPOFOL 500 MG/50ML IV EMUL
INTRAVENOUS | Status: AC
  Filled 2018-07-06: qty 50

## 2018-07-06 MED ORDER — PHENYLEPHRINE HCL (PRESSORS) 10 MG/ML IV SOLN
INTRAVENOUS | Status: DC | PRN
  Administered 2018-07-06: 200 ug via INTRAVENOUS
  Administered 2018-07-06: 100 ug via INTRAVENOUS
  Administered 2018-07-06: 200 ug via INTRAVENOUS
  Administered 2018-07-06: 100 ug via INTRAVENOUS
  Administered 2018-07-06 (×2): 200 ug via INTRAVENOUS
  Administered 2018-07-06: 100 ug via INTRAVENOUS

## 2018-07-06 MED ORDER — PROPOFOL 10 MG/ML IV BOLUS
INTRAVENOUS | Status: DC | PRN
  Administered 2018-07-06: 40 mg via INTRAVENOUS
  Administered 2018-07-06: 20 mg via INTRAVENOUS
  Administered 2018-07-06: 40 mg via INTRAVENOUS

## 2018-07-06 MED ORDER — PROPOFOL 10 MG/ML IV BOLUS
INTRAVENOUS | Status: AC
  Filled 2018-07-06: qty 20

## 2018-07-06 MED ORDER — SODIUM CHLORIDE 0.9 % IV SOLN
INTRAVENOUS | Status: DC | PRN
  Administered 2018-07-06: 16:00:00 via INTRAVENOUS

## 2018-07-06 MED ORDER — MIDAZOLAM HCL 2 MG/2ML IJ SOLN
INTRAMUSCULAR | Status: DC | PRN
  Administered 2018-07-06: 1 mg via INTRAVENOUS

## 2018-07-06 MED ORDER — FENTANYL CITRATE (PF) 100 MCG/2ML IJ SOLN
100.0000 ug | Freq: Once | INTRAMUSCULAR | Status: AC
  Administered 2018-07-06: 15:00:00 100 ug via INTRAVENOUS
  Filled 2018-07-06: qty 2

## 2018-07-06 MED ORDER — FENTANYL CITRATE (PF) 100 MCG/2ML IJ SOLN
INTRAMUSCULAR | Status: DC | PRN
  Administered 2018-07-06: 50 ug via INTRAVENOUS

## 2018-07-06 MED ORDER — EPHEDRINE SULFATE 50 MG/ML IJ SOLN
INTRAMUSCULAR | Status: DC | PRN
  Administered 2018-07-06: 10 mg via INTRAVENOUS

## 2018-07-06 MED ORDER — MIDAZOLAM HCL 2 MG/2ML IJ SOLN
INTRAMUSCULAR | Status: AC
  Filled 2018-07-06: qty 2

## 2018-07-06 MED ORDER — FENTANYL CITRATE (PF) 100 MCG/2ML IJ SOLN
INTRAMUSCULAR | Status: AC
  Filled 2018-07-06: qty 2

## 2018-07-06 MED ORDER — PROPOFOL 500 MG/50ML IV EMUL
INTRAVENOUS | Status: DC | PRN
  Administered 2018-07-06: 150 ug/kg/min via INTRAVENOUS
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
## 2018-07-06 NOTE — Anesthesia Preprocedure Evaluation (Addendum)
Anesthesia Evaluation  Patient identified by MRN, date of birth, ID band Patient awake    Reviewed: Allergy & Precautions, H&P , NPO status , Patient's Chart, lab work & pertinent test results  Airway Mallampati: III  TM Distance: >3 FB Neck ROM: full    Dental  (+) Chipped   Pulmonary asthma , former smoker,           Cardiovascular negative cardio ROS       Neuro/Psych PSYCHIATRIC DISORDERS Anxiety Depression negative neurological ROS     GI/Hepatic Neg liver ROS, GERD  Controlled,H/o hemicolectomy ~2010   Endo/Other  Hypothyroidism   Renal/GU CRFRenal disease  negative genitourinary   Musculoskeletal   Abdominal   Peds  Hematology negative hematology ROS (+)   Anesthesia Other Findings  Past Medical History: No date: Abnormal laboratory test     Comment:  Sees Dr. Ma Hillock No date: Acid reflux bee venom: Allergy     Comment:  mainly as a child 12/08/1969: Anxiety No date: Arthritis 12/08/1953: Asthma     Comment:  mainly as a child No date: Chronic back pain 11/1999: Colon cancer ([place])     Comment:  stage 3 s/p colon resection No date: Depression No date: H/O Clostridium difficile infection No date: History of chemotherapy     Comment:  5-FU pump/leukovorin No date: History of kidney stones No date: Hypothyroidism     Comment:  NO MEDS NOW No date: Insomnia 12/2011: Multiple myeloma ([place])     Comment:  smoldering vs mgus 08/28/2017: Nephrolithiasis No date: Osteoporosis No date: Seasonal allergies  Past Surgical History: No date: BACK SURGERY 2001: COLON SURGERY     Comment:  resection, 2nd surgery for scar tissue removal No date: COLONOSCOPY 03/26/2016: COLONOSCOPY WITH PROPOFOL; N/A     Comment:  Procedure: COLONOSCOPY WITH PROPOFOL;  Surgeon: Lollie Sails, MD;  Location: [facility] ENDOSCOPY;  Service:               Endoscopy;  Laterality: N/A; 02/21/2018: CYSTOSCOPY WITH  URETEROSCOPY AND STENT PLACEMENT; Left     Comment:  Procedure: URETEROSCOPY, LASER LITHOTRIPSY, STONE               REMOVAL AND STENT PLACEMENT;  Surgeon: Abbie Sons,               MD;  Location: [facility] ORS;  Service: Urology;  Laterality:               Left; 09/03/2017: CYSTOSCOPY/URETEROSCOPY/HOLMIUM LASER/STENT PLACEMENT;  Right     Comment:  Procedure: CYSTOSCOPY/URETEROSCOPY/HOLMIUM LASER/STENT               PLACEMENT;  Surgeon: Abbie Sons, MD;  Location:               [facility] ORS;  Service: Urology;  Laterality: Right; 10/2010: ESOPHAGOGASTRODUODENOSCOPY 1994 and 2010: EYE SURGERY     Comment:  RKA in 89 and [place] in 2010 1970: HERNIA REPAIR     Comment:  lower left 02/21/2018: HOLMIUM LASER APPLICATION; Left     Comment:  Procedure: HOLMIUM LASER APPLICATION;  Surgeon: Abbie Sons, MD;  Location: [facility] ORS;  Service: Urology;                Laterality: Left; No date: LUMBAR FUSION     Comment:  L4-L5, L5-S1 No  date: SACROILIAC JOINT FUSION No date: SHOULDER SURGERY; Left No date: SPINAL CORD STIMULATOR IMPLANT  BMI    Body Mass Index:  18.79 kg/m      Reproductive/Obstetrics negative OB ROS                            Anesthesia Physical Anesthesia Plan  ASA: II and emergent  Anesthesia Plan: General   Post-op Pain Management:    Induction:   PONV Risk Score and Plan: Propofol infusion and TIVA  Airway Management Planned: Natural Airway and Nasal Cannula  Additional Equipment:   Intra-op Plan:   Post-operative Plan:   Informed Consent: I have reviewed the patients History and Physical, chart, labs and discussed the procedure including the risks, benefits and alternatives for the proposed anesthesia with the patient or authorized representative who has indicated his/her understanding and acceptance.     Dental Advisory Given  Plan Discussed with: Anesthesiologist and CRNA  Anesthesia Plan Comments: (NPO  appropriate, no vomiting or reflux.  Plan GA native airway)        Anesthesia Quick Evaluation  

## 2018-07-06 NOTE — Consult Note (Signed)
[facility] GI Inpatient Consult Note   Kathline Magic, M.D.  Reason for Consult:  Attending Requesting Consult: Rollene Fare, PA-C   History of Present Illness: [name] is a 68 y.o. male  presents the emergency room with complaint of rectal pain and pressure for the last 48 hours.  The patient inserted a Coke bottle, plastic, approximately 8 oz size, bottle cap inserted first.  The bottle was inserted for personal pleasure reasons and appeared to migrate up further than intended.  The patient attempted to remove the bottle digitally without success.  He hoped that a bowel movement would help pass the foreign body but this was unsuccessful and the patient has had some relative mild obstruction but is still passing some flatus.  There is been minimal rectal bleeding other than when he attempts to digitally manipulate the bottle.  There is no abdominal pain or fever or vomiting. The patient has a history of colon cancer and reportedly underwent a left segmental colectomy with primary anastomosis in 2001.   Past Medical History:  Past Medical History:  Diagnosis Date  . Abnormal laboratory test    Sees Dr. Ma Hillock  . Acid reflux   . Allergy bee venom   mainly as a child  . Anxiety 12/08/1969  . Arthritis   . Asthma 12/08/1953   mainly as a child  . Chronic back pain   . Colon cancer ([place]) 11/1999   stage 3 s/p colon resection  . Depression   . H/O Clostridium difficile infection   . History of chemotherapy    5-FU pump/leukovorin  . History of kidney stones   . Hypothyroidism    NO MEDS NOW  . Insomnia   . Multiple myeloma ([place]) 12/2011   smoldering vs mgus  . Nephrolithiasis 08/28/2017  . Osteoporosis   . Seasonal allergies     Problem List: Patient Active Problem List   Diagnosis Date Noted  . Spinal cord stimulator status 03/03/2018  . Major depressive disorder in partial remission ([place]) 03/03/2018  . Ureteral calculus 08/28/2017  . Renal colic  13/09/6576  . Nephrolithiasis 08/28/2017  . History of colon cancer 08/19/2017  . OP (osteoporosis) 04/19/2017  . Abdominal spasms 01/16/2016  . 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 Arthritis 05/18/2015  . Hyperglycemia 02/16/2015  . Anxiety disorder 10/04/2014  . Allergic rhinitis 10/04/2014  . Gastroesophageal reflux disease without esophagitis 10/04/2014  . Insomnia 09/06/2014    Past Surgical History: Past Surgical History:  Procedure Laterality Date  . BACK SURGERY    . COLON SURGERY  2001   resection, 2nd surgery for scar tissue removal  . COLONOSCOPY    . COLONOSCOPY WITH PROPOFOL N/A 03/26/2016   Procedure: COLONOSCOPY WITH PROPOFOL;  Surgeon: Lollie Sails, MD;  Location: [facility] ENDOSCOPY;  Service: Endoscopy;  Laterality: N/A;  . CYSTOSCOPY WITH URETEROSCOPY AND STENT PLACEMENT Left 02/21/2018   Procedure: URETEROSCOPY, LASER LITHOTRIPSY, STONE REMOVAL AND STENT PLACEMENT;  Surgeon: Abbie Sons, MD;  Location: [facility] ORS;  Service: Urology;  Laterality: Left;  . CYSTOSCOPY/URETEROSCOPY/HOLMIUM LASER/STENT PLACEMENT Right 09/03/2017   Procedure: CYSTOSCOPY/URETEROSCOPY/HOLMIUM LASER/STENT PLACEMENT;  Surgeon: Abbie Sons, MD;  Location: [facility] ORS;  Service: Urology;  Laterality: Right;  . ESOPHAGOGASTRODUODENOSCOPY  10/2010  . EYE SURGERY  1994 and 2010   RKA in 85 and [place] in 2010  . HERNIA REPAIR  1970   lower left  . HOLMIUM LASER APPLICATION Left 4/69/6295   Procedure: HOLMIUM LASER APPLICATION;  Surgeon:  Stoioff, Ronda Fairly, MD;  Location: [facility] ORS;  Service: Urology;  Laterality: Left;  . LUMBAR FUSION     L4-L5, L5-S1  . SACROILIAC JOINT FUSION    . SHOULDER SURGERY Left   . SPINAL CORD STIMULATOR IMPLANT      Allergies: Allergies  Allergen Reactions  . Bee Venom Swelling    Home Medications: (Not in a hospital admission)  Reflect 6 yes but trying to think amount of pain that we can maybe use a no free get the regular standard both  upper and lower scope no known is found that she is that she is a standard gastroscope #Brittainy Senior is an single rehab we do not have anything else is reminded to use her husband and her son when they are out of note there is a foreign body get some review with his on the call you when we get that look at and see PRN Inpatient Medications:    Family History: family history includes Alcohol abuse in his brother, father, sister, and son; Arthritis in his brother, mother, and sister; Cancer in his father and mother; Depression in his brother and sister; Diabetes in his brother, brother, and son; Early death in his son; Heart disease in an other family member; Hyperlipidemia in his sister; Lung cancer in his father; Lymphoma in his mother.   GI Family History: Negative   Social History:   reports that he quit smoking about 48 years ago. His smoking use included cigarettes. He has a 10.00 pack-year smoking history. He has never used smokeless tobacco. He reports that he does not drink alcohol or use drugs. The patient denies ETOH, tobacco, or drug use.    Review of Systems: Review of Systems - Negative except HPI  Physical Examination: BP (!) 153/97   Pulse 80   Temp 98.7 F (37.1 C) (Oral)   Resp 12   Ht 5' 7"  (1.702 m)   Wt 54.4 kg   SpO2 98%   BMI 18.79 kg/m  Physical Exam Constitutional:      General: He is not in acute distress.    Appearance: Normal appearance.  HENT:     Head: Normocephalic.     Nose: Nose normal.  Eyes:     Extraocular Movements: Extraocular movements intact.     Conjunctiva/sclera: Conjunctivae normal.     Pupils: Pupils are equal, round, and reactive to light.  Cardiovascular:     Rate and Rhythm: Normal rate.     Heart sounds: No murmur. No friction rub. No gallop.   Pulmonary:     Effort: Pulmonary effort is normal.  Abdominal:     General: Abdomen is flat.     Palpations: Abdomen is soft.  Genitourinary:    Comments: Some external  hemorrhoids.  Normal tone. Foreign body (bottom of coke bottle) palpable with the glove finger. Could not maneuver bottle to remove and it appeared to migrate cephalad with pressure. Neurological:     General: No focal deficit present.     Mental Status: He is alert.     Data: Lab Results  Component Value Date   WBC 9.7 07/06/2018   HGB 15.3 07/06/2018   HCT 43.8 07/06/2018   MCV 91.3 07/06/2018   PLT 364 07/06/2018   Recent Labs  Lab 07/06/18 1420  HGB 15.3   Lab Results  Component Value Date   NA 137 07/06/2018   K 4.0 07/06/2018   CL 102 07/06/2018   CO2 25 07/06/2018  BUN 33 (H) 07/06/2018   CREATININE 1.26 (H) 07/06/2018   Lab Results  Component Value Date   ALT 14 01/17/2018   AST 22 01/17/2018   ALKPHOS 60 01/17/2018   BILITOT 0.7 01/17/2018   No results for input(s): APTT, INR, PTT in the last 168 hours. CBC Latest Ref Rng & Units 07/06/2018 01/17/2018 11/25/2017  WBC 4.0 - 10.5 K/uL 9.7 6.9 7.8  Hemoglobin 13.0 - 17.0 g/dL 15.3 13.7 14.2  Hematocrit 39.0 - 52.0 % 43.8 39.9 40.4  Platelets 150 - 400 K/uL 364 324 335    STUDIES: Dg Chest 1 View  Result Date: 07/06/2018 CLINICAL DATA:  Foreign body in rectum EXAM: CHEST  1 VIEW COMPARISON:  12/11/2012 chest radiograph. FINDINGS: Spinal stimulator leads terminate over the midthoracic spine. Surgical clips in the left upper quadrant of the abdomen. No additional radiopaque foreign bodies. Stable cardiomediastinal silhouette with normal heart size. No pneumothorax. No pleural effusion. Lungs appear clear, with no acute consolidative airspace disease and no pulmonary edema. No evidence of free air under the hemidiaphragms. IMPRESSION: Spinal stimulator leads terminate over the midthoracic spine. Surgical clips in the left upper quadrant of the abdomen. No additional radiopaque foreign bodies. No evidence of free air under the hemidiaphragms. No active cardiopulmonary disease. Electronically Signed   By: Ilona Sorrel  M.D.   On: 07/06/2018 14:53   Dg Abdomen 1 View  Result Date: 07/06/2018 CLINICAL DATA:  Foreign body in rectum EXAM: ABDOMEN - 1 VIEW COMPARISON:  02/13/2018 CT abdomen/pelvis FINDINGS: Sacroiliac joints transfixed by three intact appearing pins bilaterally. Left pelvic spinal stimulator demonstrates leads coursing superiorly over the spine with the tips overlying the T7-8 thoracic spinal canal. Surgical clips are again noted in the left upper quadrant and right deep pelvis. Surgical sutures are noted in the left upper abdomen. Bone cages overlie the lower lumbar spine. Calcifications overlying the left symphysis pubis correlate with prostatic calcifications on prior CT. No additional radiopaque foreign bodies. No disproportionately dilated small bowel loops. Large amount of colonic gas and stool throughout the large bowel. No evidence of pneumatosis or pneumoperitoneum. IMPRESSION: No new radiopaque foreign bodies, noting multiple postsurgical clips, sutures and hardware as detailed. Nonobstructive bowel gas pattern. Large amount of colonic gas and stool. No evidence of pneumatosis or pneumoperitoneum on these supine views. Electronically Signed   By: Ilona Sorrel M.D.   On: 07/06/2018 14:52   @IMAGES @  Assessment:1. Rectal foreign body  COVID-19 status: Not tested, low suspicion and emergency situation.  Recommendations: 1. Emergent sigmoidoscopy with foreign body removal. The patient understands the nature of the planned procedure, indications, risks, alternatives and potential complications including but not limited to bleeding, infection, perforation, damage to internal organs and possible oversedation/side effects from anesthesia. The patient agrees and gives consent to proceed.  Please refer to procedure notes for findings, recommendations and patient disposition/instructions.  Thank you for the consult. Please call with questions or concerns.  Olean Ree, "Lanny Hurst MD [facility]  Gastroenterology [place], [place] [address] [phone]  07/06/2018 4:02 PM

## 2018-07-06 NOTE — Anesthesia Postprocedure Evaluation (Signed)
Anesthesia Post Note  Patient: [name]  Procedure(s) Performed: FLEXIBLE SIGMOIDOSCOPY (N/A )  Patient location during evaluation: PACU Anesthesia Type: General Level of consciousness: awake and alert Pain management: pain level controlled Vital Signs Assessment: post-procedure vital signs reviewed and stable Respiratory status: spontaneous breathing, nonlabored ventilation and respiratory function stable Cardiovascular status: blood pressure returned to baseline and stable Postop Assessment: no apparent nausea or vomiting Anesthetic complications: no     Last Vitals:  Vitals:   07/06/18 1829 07/06/18 1830  BP: (!) 162/87   Pulse: 68 69  Resp: 20 19  Temp:    SpO2: 100% 100%    Last Pain:  Vitals:   07/06/18 1810  TempSrc:   PainSc: 0-No pain                 Durenda Hurt

## 2018-07-06 NOTE — Anesthesia Procedure Notes (Signed)
Date/Time: 07/06/2018 4:30 PM Performed by: Allean Found, CRNA Pre-anesthesia Checklist: Patient identified, Emergency Drugs available, Suction available, Patient being monitored and Timeout performed Patient Re-evaluated:Patient Re-evaluated prior to induction Oxygen Delivery Method: Nasal cannula Placement Confirmation: positive ETCO2

## 2018-07-06 NOTE — Anesthesia Post-op Follow-up Note (Signed)
Anesthesia QCDR form completed.        

## 2018-07-06 NOTE — ED Triage Notes (Signed)
Pt presents via POV c/o FB in rectum. Reports miniature plastic Coke bottle placed in rectum on Friday, Unable to remove.

## 2018-07-06 NOTE — Transfer of Care (Signed)
Immediate Anesthesia Transfer of Care Note  Patient: [name]  Procedure(s) Performed: FLEXIBLE SIGMOIDOSCOPY (N/A )  Patient Location: PACU  Anesthesia Type:General  Level of Consciousness: awake  Airway & Oxygen Therapy: Patient Spontanous Breathing and Patient connected to nasal cannula oxygen  Post-op Assessment: Report given to RN  Post vital signs: Reviewed and stable  Last Vitals:  Vitals Value Taken Time  BP 127/80 07/06/2018  5:57 PM  Temp 36.1 C 07/06/2018  5:57 PM  Pulse 67 07/06/2018  5:58 PM  Resp 17 07/06/2018  5:58 PM  SpO2 100 % 07/06/2018  5:58 PM  Vitals shown include unvalidated device data.  Last Pain:  Vitals:   07/06/18 1757  TempSrc: Tympanic  PainSc: 0-No pain         Complications: No apparent anesthesia complications

## 2018-07-06 NOTE — Op Note (Signed)
[facility] Gastroenterology Patient Name: [name] Procedure Date: 07/06/2018 4:29 PM MRN: [id number] Account #: [id number] Date of Birth: January 17, 1951 Admit Type: Emergency Department Age: 68 Room: [facility] ENDO ROOM 3 Gender: Male Note Status: Finalized Procedure:            Flexible Sigmoidoscopy Indications:          Rectal foreign body (8.55 oz plastic coke bottle) Providers:            Benay Pike. Alice Reichert MD, MD Referring MD:         Raelyn Ensign Medicines:            Propofol per Anesthesia Complications:        No immediate complications. Procedure:            Pre-Anesthesia Assessment:                       - The risks and benefits of the procedure and the                        sedation options and risks were discussed with the                        patient. All questions were answered and informed                        consent was obtained.                       - Patient identification and proposed procedure were                        verified prior to the procedure by the nurse. The                        procedure was verified in the procedure room.                       - ASA Grade Assessment: II - A patient with mild                        systemic disease.                       - After reviewing the risks and benefits, the patient                        was deemed in satisfactory condition to undergo the                        procedure.                       After obtaining informed consent, the scope was passed                        under direct vision. The Colonoscope was introduced                        through the anus and advanced to the the sigmoid colon.  The flexible sigmoidoscopy was technically difficult                        and complex due to foreign body in the rectum. Problem                        resolved with foreign body removal. Findings:      The perianal exam findings include internal hemorrhoids that  prolapse       with straining, but spontaneously regress to the resting position (Grade       II).      The digital rectal exam findings include low anal tone and palpable       foreign body in the rectal vault.      A foreign body was found in the rectum. There were some attempts to       remove the foreign body with 3 gloved fingers, which was unsuccessful.       Raptor forceps were unsuccessful. A variceal banding cap was attempted       to suction out the bottle, unsuccessful. The scope was then advanced       with great difficulty around the bottle and above the top of the bottle.       The bottle cap was still affixed to the water-filled bottle. The scope       was retroflexed in the distal sigmoid. A snare was then placed via       endoscopic visualization around the bottle cap and neck of the bottle.       The scope itself was then slowly withdrawn over the snare catheter and       withdrawn completely from the anus, leaving the catheter in       place.Copious amounts of lubricant were injected rectally.The left       forefinger was placed in the rectum and used to place on the bottom of       the bottle which was unfortunately angled to the sacrum.I pulled the       catheter with the right hand and angled the bottle out with the left       forefinger, and the bottle was withdrawn from the anus.      After removal of the foreign body, the scope was reinserted and the       sigmoid colon was examined. Small pressure erosions were noted at the       rectosigmoid junction, but no mucosal perforation or other abnormalities       were noted.      Non-bleeding internal hemorrhoids were found during retroflexion. The       hemorrhoids were Grade II (internal hemorrhoids that prolapse but reduce       spontaneously). Impression:           - Internal hemorrhoids that prolapse with straining,                        but spontaneously regress to the resting position                         (Grade II) found on perianal exam.                       - Low anal tone and palpable foreign body in the rectal  vault found on digital rectal exam.                       - Foreign body in the rectum.                       - Non-bleeding internal hemorrhoids.                       - No specimens collected. Recommendation:       - Discharge patient to home (with spouse).                       - Return to my office PRN.                       - The findings and recommendations were discussed with                        the patient. Procedure Code(s):    --- Professional ---                       [phone], Sigmoidoscopy, flexible; diagnostic, including                        collection of specimen(s) by brushing or washing, when                        performed (separate procedure) Diagnosis Code(s):    --- Professional ---                       J09.3OIZ, Foreign body in anus and rectum, initial                        encounter                       K64.1, Second degree hemorrhoids CPT copyright 2019 American [employer] Association. All rights reserved. The codes documented in this report are preliminary and upon coder review may  be revised to meet current compliance requirements. Efrain Sella MD, MD 07/06/2018 6:26:26 PM This report has been signed electronically. Number of Addenda: 0 Note Initiated On: 07/06/2018 4:29 PM Total Procedure Duration: 1 hour 2 minutes 31 seconds       [facility]

## 2018-07-07 ENCOUNTER — Ambulatory Visit (INDEPENDENT_AMBULATORY_CARE_PROVIDER_SITE_OTHER): Payer: Medicare Other

## 2018-07-07 ENCOUNTER — Other Ambulatory Visit: Payer: Self-pay

## 2018-07-07 ENCOUNTER — Encounter: Payer: Self-pay | Admitting: Internal Medicine

## 2018-07-07 DIAGNOSIS — R739 Hyperglycemia, unspecified: Secondary | ICD-10-CM

## 2018-07-07 DIAGNOSIS — F324 Major depressive disorder, single episode, in partial remission: Secondary | ICD-10-CM

## 2018-07-07 DIAGNOSIS — D472 Monoclonal gammopathy: Secondary | ICD-10-CM

## 2018-07-07 DIAGNOSIS — C9000 Multiple myeloma not having achieved remission: Secondary | ICD-10-CM

## 2018-07-07 NOTE — Patient Instructions (Signed)
  Thank you allowing the Chronic Care Management Team to be a part of your care! It was a pleasure speaking with you today!  1. Please call CCM RN CM if any needs or concerns arise.  CCM (Chronic Care Management) Team   Trish Fountain RN, BSN Nurse Care Coordinator  [phone]  Ruben Reason PharmD  Clinical Pharmacist  [phone]   Elliot Gurney, LCSW Clinical Social Worker [phone]  Goals Addressed            This Visit's Progress   . I don't have any real needs I am just glad I have someone I can call when I need them (pt-stated)       Current Barriers:  Marland Kitchen Knowledge Deficits related to Chronic Case Management Program and Elidgibility  Nurse Case Manager Clinical Goal(s):  Marland Kitchen Over the next 30 days, Patient will utilize RN CM as discussed today if needs arise  Interventions:  . Assessed for educational needs related to patients ability to manage his health . Discussed services provided by [facility] Team . Assessed for social determinants of health . Provided patient with contact information for CCM Team . Discussed CDC guidelines for Covid-19 infection prevention  Patient Self Care Activities:  . Self administers medications as prescribed . Attends all scheduled provider appointments . Calls pharmacy for medication refills . Performs ADL's independently . Performs IADL's independently . Calls provider office for new concerns or questions  . Contact CCM RN CM if additional needs arise   Initial goal documentation        The patient verbalized understanding of instructions provided today and declined a print copy of patient instruction materials.   The patient has been provided with contact information for the care management team and has been advised to call with any health related questions or concerns.   SYMPTOMS OF A STROKE   You have any symptoms of stroke. "BE FAST" is an easy way to remember the main warning signs: ? B - Balance. Signs are  dizziness, sudden trouble walking, or loss of balance. ? E - Eyes. Signs are trouble seeing or a sudden change in how you see. ? F - Face. Signs are sudden weakness or loss of feeling of the face, or the face or eyelid drooping on one side. ? A - Arms. Signs are weakness or loss of feeling in an arm. This happens suddenly and usually on one side of the body. ? S - Speech. Signs are sudden trouble speaking, slurred speech, or trouble understanding what people say. ? T - Time. Time to call emergency services. Write down what time symptoms started.  You have other signs of stroke, such as: ? A sudden, very bad headache with no known cause. ? Feeling sick to your stomach (nausea). ? Throwing up (vomiting). ? Jerky movements you cannot control (seizure).  SYMPTOMS OF A HEART ATTACK  What are the signs or symptoms? Symptoms of this condition include:  Chest pain. It may feel like: ? Crushing or squeezing. ? Tightness, pressure, fullness, or heaviness.  Pain in the arm, neck, jaw, back, or upper body.  Shortness of breath.  Heartburn.  Indigestion.  Nausea.  Cold sweats.  Feeling tired.  Sudden lightheadedness.

## 2018-07-07 NOTE — Chronic Care Management (AMB) (Signed)
Chronic Care Management   Initial Visit Note  07/07/2018 Name: [name] MRN: [id number] DOB: 02-05-1951  Subjective: "I don't have any real chronic conditions accept back issues and a history of colon cancer."  Objective:  BP Readings from Last 3 Encounters:  07/06/18 (!) 162/87  03/06/18 (!) 153/80  03/03/18 120/80   Advanced Directives 07/07/2018  Does Patient Have a Medical Advance Directive? Yes  Type of Paramedic of [place];Living will  Does patient want to make changes to medical advance directive? -  Copy of [place] in Chart? No - copy requested  Would patient like information on creating a medical advance directive? -   Assessment: [name] is a 68 year old male patient of Christopher Ensign, FNP. [name] was referred to the CCM Team by his health plan. He was consented to services 07/03/2018. He has a history of but not limited to renal sones, GERD, Anxiety and Depression, Colon Cancer 2001, and spinal cord stimulator implantation. Today RN CM reached out to [name] to assess for health needs and goals.  Review of patient status, including review of consultants reports, relevant laboratory and other test results, and collaboration with appropriate care team members and the patient's provider was performed as part of comprehensive patient evaluation and provision of chronic care management services.    1 ED visit in the last 6 months   Goals Addressed            This Visit's Progress   . I don't have any real needs I am just glad I have someone I can call when I need them (pt-stated)       RN CM completed a joint call with patient and wife, per patient request. Patient states he is doing well, but in pain. Patient presented to ED 07/06/2018 and procedure was performed. Patient did not want to discuss ED visit with RN CM. Notes reviewed. [name] does not have any chronic conditions such as HTN, DM, COPD, or HF. He does  have a history of colon CA 2001 but does not require any ongoing care related to that diagnosis, other than routine colonoscopy. It is noted in his chart that he has hyperglycemia, but most recent results of 136 was post meal. He states he is very active and plays golf weekly and was bowling until COVID closed the alleys. He would like a referral to an orthopedist to discuss chronic neck and shoulder pain. He was told "years ago" his shoulder pain originated from his cervical spine. His SMM is being "watched" by his oncologist. He eats "healthy". He can afford all his medications as he has Plan [employer]. He denies need for counseling or counseling resources related to depression and anxiety.   Current Barriers:  Marland Kitchen Knowledge Deficits related to Chronic Case Management Program and Elidgibility  Nurse Case Manager Clinical Goal(s):  Marland Kitchen Over the next 30 days, Patient will utilize RN CM as discussed today if needs arise  Interventions:  . Assessed for educational needs related to patients ability to manage his health . Discussed services provided by [facility] Team . Assessed for social determinants of health . Provided patient with contact information for CCM Team . Discussed CDC guidelines for Covid-19 infection prevention  Patient Self Care Activities:  . Self administers medications as prescribed . Attends all scheduled provider appointments . Calls pharmacy for medication refills . Performs ADL's independently . Performs IADL's independently . Calls provider office for  new concerns or questions  . Contact CCM RN CM if additional needs arise   Initial goal documentation         Follow up plan:  The patient has been provided with contact information for the care management team and has been advised to call with any health related questions or concerns.    [name] was given information about Chronic Care Management services today including:  1. CCM service includes personalized support from  designated clinical staff supervised by his physician, including individualized plan of care and coordination with other care providers 2. 24/7 contact phone numbers for assistance for urgent and routine care needs. 3. Service will only be billed when office clinical staff spend 20 minutes or more in a month to coordinate care. 4. Only one practitioner may furnish and bill the service in a calendar month. 5. The patient may stop CCM services at any time (effective at the end of the month) by phone call to the office staff. 6. The patient will be responsible for cost sharing (co-pay) of up to 20% of the service fee (after annual deductible is met).  Patient agreed to services and verbal consent obtained.   [name] E. Rollene Rotunda, RN, BSN Nurse Care Coordinator [facility] Care Management  [phone]

## 2018-07-18 ENCOUNTER — Other Ambulatory Visit: Payer: Self-pay

## 2018-07-18 ENCOUNTER — Inpatient Hospital Stay: Payer: Medicare Other | Attending: Internal Medicine

## 2018-07-18 DIAGNOSIS — F329 Major depressive disorder, single episode, unspecified: Secondary | ICD-10-CM | POA: Diagnosis not present

## 2018-07-18 DIAGNOSIS — C9000 Multiple myeloma not having achieved remission: Secondary | ICD-10-CM | POA: Diagnosis not present

## 2018-07-18 DIAGNOSIS — Z85038 Personal history of other malignant neoplasm of large intestine: Secondary | ICD-10-CM | POA: Diagnosis not present

## 2018-07-18 DIAGNOSIS — E039 Hypothyroidism, unspecified: Secondary | ICD-10-CM | POA: Insufficient documentation

## 2018-07-18 DIAGNOSIS — Z9049 Acquired absence of other specified parts of digestive tract: Secondary | ICD-10-CM | POA: Insufficient documentation

## 2018-07-18 DIAGNOSIS — Z791 Long term (current) use of non-steroidal anti-inflammatories (NSAID): Secondary | ICD-10-CM | POA: Insufficient documentation

## 2018-07-18 DIAGNOSIS — Z79899 Other long term (current) drug therapy: Secondary | ICD-10-CM | POA: Insufficient documentation

## 2018-07-18 DIAGNOSIS — Z87891 Personal history of nicotine dependence: Secondary | ICD-10-CM | POA: Insufficient documentation

## 2018-07-18 DIAGNOSIS — D472 Monoclonal gammopathy: Secondary | ICD-10-CM

## 2018-07-18 LAB — CBC WITH DIFFERENTIAL/PLATELET
Abs Immature Granulocytes: 0.01 10*3/uL (ref 0.00–0.07)
Basophils Absolute: 0 10*3/uL (ref 0.0–0.1)
Basophils Relative: 0 %
Eosinophils Absolute: 0.2 10*3/uL (ref 0.0–0.5)
Eosinophils Relative: 3 %
HCT: 38.8 % — ABNORMAL LOW (ref 39.0–52.0)
Hemoglobin: 13.7 g/dL (ref 13.0–17.0)
Immature Granulocytes: 0 %
Lymphocytes Relative: 32 %
Lymphs Abs: 2 10*3/uL (ref 0.7–4.0)
MCH: 32.2 pg (ref 26.0–34.0)
MCHC: 35.3 g/dL (ref 30.0–36.0)
MCV: 91.1 fL (ref 80.0–100.0)
Monocytes Absolute: 0.6 10*3/uL (ref 0.1–1.0)
Monocytes Relative: 9 %
Neutro Abs: 3.6 10*3/uL (ref 1.7–7.7)
Neutrophils Relative %: 56 %
Platelets: 363 10*3/uL (ref 150–400)
RBC: 4.26 MIL/uL (ref 4.22–5.81)
RDW: 11.9 % (ref 11.5–15.5)
WBC: 6.4 10*3/uL (ref 4.0–10.5)
nRBC: 0 % (ref 0.0–0.2)

## 2018-07-18 LAB — COMPREHENSIVE METABOLIC PANEL
ALT: 14 U/L (ref 0–44)
AST: 20 U/L (ref 15–41)
Albumin: 4.3 g/dL (ref 3.5–5.0)
Alkaline Phosphatase: 64 U/L (ref 38–126)
Anion gap: 9 (ref 5–15)
BUN: 28 mg/dL — ABNORMAL HIGH (ref 8–23)
CO2: 25 mmol/L (ref 22–32)
Calcium: 9.3 mg/dL (ref 8.9–10.3)
Chloride: 104 mmol/L (ref 98–111)
Creatinine, Ser: 1.3 mg/dL — ABNORMAL HIGH (ref 0.61–1.24)
GFR calc Af Amer: >60 mL/min (ref >60)
GFR calc non Af Amer: 56 mL/min — ABNORMAL LOW (ref >60)
Glucose, Bld: 136 mg/dL — ABNORMAL HIGH (ref 70–99)
Potassium: 4.1 mmol/L (ref 3.5–5.1)
Sodium: 138 mmol/L (ref 135–145)
Total Bilirubin: 0.6 mg/dL (ref 0.3–1.2)
Total Protein: 6.8 g/dL (ref 6.5–8.1)

## 2018-07-21 LAB — KAPPA/LAMBDA LIGHT CHAINS
Kappa free light chain: 115.8 mg/L — ABNORMAL HIGH (ref 3.3–19.4)
Kappa, lambda light chain ratio: 9.73 — ABNORMAL HIGH (ref 0.26–1.65)
Lambda free light chains: 11.9 mg/L (ref 5.7–26.3)

## 2018-07-21 LAB — MULTIPLE MYELOMA PANEL, SERUM
Albumin SerPl Elph-Mcnc: 3.7 g/dL (ref 2.9–4.4)
Albumin/Glob SerPl: 1.5 (ref 0.7–1.7)
Alpha 1: 0.2 g/dL (ref 0.0–0.4)
Alpha2 Glob SerPl Elph-Mcnc: 0.6 g/dL (ref 0.4–1.0)
B-Globulin SerPl Elph-Mcnc: 0.9 g/dL (ref 0.7–1.3)
Gamma Glob SerPl Elph-Mcnc: 0.9 g/dL (ref 0.4–1.8)
Globulin, Total: 2.6 g/dL (ref 2.2–3.9)
IgA: 347 mg/dL (ref 61–437)
IgG (Immunoglobin G), Serum: 717 mg/dL (ref 603–1613)
IgM (Immunoglobulin M), Srm: 55 mg/dL (ref 20–172)
M Protein SerPl Elph-Mcnc: 0.3 g/dL — ABNORMAL HIGH
Total Protein ELP: 6.3 g/dL (ref 6.0–8.5)

## 2018-07-23 ENCOUNTER — Ambulatory Visit: Payer: Medicare Other | Admitting: Urology

## 2018-07-24 ENCOUNTER — Other Ambulatory Visit: Payer: Self-pay

## 2018-07-25 ENCOUNTER — Other Ambulatory Visit: Payer: Self-pay

## 2018-07-25 ENCOUNTER — Inpatient Hospital Stay (HOSPITAL_BASED_OUTPATIENT_CLINIC_OR_DEPARTMENT_OTHER): Payer: Medicare Other | Admitting: Internal Medicine

## 2018-07-25 ENCOUNTER — Encounter: Payer: Self-pay | Admitting: Internal Medicine

## 2018-07-25 DIAGNOSIS — Z791 Long term (current) use of non-steroidal anti-inflammatories (NSAID): Secondary | ICD-10-CM

## 2018-07-25 DIAGNOSIS — D472 Monoclonal gammopathy: Secondary | ICD-10-CM

## 2018-07-25 DIAGNOSIS — Z9049 Acquired absence of other specified parts of digestive tract: Secondary | ICD-10-CM | POA: Diagnosis not present

## 2018-07-25 DIAGNOSIS — F329 Major depressive disorder, single episode, unspecified: Secondary | ICD-10-CM

## 2018-07-25 DIAGNOSIS — C9000 Multiple myeloma not having achieved remission: Secondary | ICD-10-CM | POA: Diagnosis not present

## 2018-07-25 DIAGNOSIS — Z79899 Other long term (current) drug therapy: Secondary | ICD-10-CM | POA: Diagnosis not present

## 2018-07-25 DIAGNOSIS — E039 Hypothyroidism, unspecified: Secondary | ICD-10-CM

## 2018-07-25 DIAGNOSIS — Z87891 Personal history of nicotine dependence: Secondary | ICD-10-CM

## 2018-07-25 DIAGNOSIS — Z85038 Personal history of other malignant neoplasm of large intestine: Secondary | ICD-10-CM | POA: Diagnosis not present

## 2018-07-25 NOTE — Assessment & Plan Note (Addendum)
#  SMOLDERING MULTIPLE MYELOMA [2013 s/p BMBx].  No clinical evidence of progression.  Clinically stable.  # M protein 0.3 g/dL; kappa lambda light chain ratio ~9-10.  Hemoglobin calcium normal.  Creatinine stable around 1.3- Stable [see below]  #CKD stage III-creatinine 1.3; discussed with patient that I would not recommend NSAIDs as he is at high risk for worsening renal function given his smoldering myeloma.  He agrees.  # Arthritis- recommend HOLD off NSAIDS; ok to take tylenol prn as needed.    # DISPOSITION:  # follow up in 6 months/labs-MD-cbc/cmp/MM panel/K-L light chains- 1 week prior-Dr.B

## 2018-07-25 NOTE — Progress Notes (Signed)
[place] OFFICE PROGRESS NOTE  Patient Care Team: Hubbard Hartshorn, FNP as PCP - General (Family Medicine) Cammie Sickle, MD as Consulting Physician (Internal Medicine) Lollie Sails, MD as Consulting Physician (Gastroenterology) Benedetto Goad, RN as Case Manager  Cancer Staging No matching staging information was found for the patient.   Oncology History Overview Note  # NOV 2013- SMOLDERING MULTIPLE MYELOMA [IgA Kappa; BMBx- 10-12% plasma cell;Dr.Trillo [place]]  # 2001- COLON CA STAGE III [s/p chemo; [place]]  # colonoscopy- 2018/ Dr.Anna  DIAGNOSIS: Smoldering myeloma  GOALS: Cure  CURRENT/MOST RECENT THERAPY: Surveillance    Smoldering multiple myeloma (SMM) (HCC)   INTERVAL HISTORY:  [name] 68 y.o.  male pleasant patient above history of smoldering myeloma currently on surveillance is here for follow-up.  Patient continues to be fairly active playing golf.  He continues to have chronic joint pains for which he takes Advil 2 pills a day.  He denies any blood in stools or black or stools.  Denies any bone pain.  No nausea no vomiting.  No tingling or numbness.  No nausea no vomiting   Review of Systems  Constitutional: Negative for chills, diaphoresis, fever, malaise/fatigue and weight loss.  HENT: Negative for nosebleeds and sore throat.   Eyes: Negative for double vision.  Respiratory: Negative for cough, hemoptysis, sputum production, shortness of breath and wheezing.   Cardiovascular: Negative for chest pain, palpitations, orthopnea and leg swelling.  Gastrointestinal: Negative for abdominal pain, blood in stool, constipation, diarrhea, heartburn, melena, nausea and vomiting.  Genitourinary: Negative for dysuria, frequency and urgency.  Musculoskeletal: Positive for back pain and joint pain.  Skin: Negative.  Negative for itching and rash.  Neurological: Negative for dizziness, tingling, focal weakness, weakness and  headaches.  Endo/Heme/Allergies: Does not bruise/bleed easily.  Psychiatric/Behavioral: Negative for depression. The patient is not nervous/anxious and does not have insomnia.       PAST MEDICAL HISTORY :  Past Medical History:  Diagnosis Date  . Abnormal laboratory test    Sees Dr. Ma Hillock  . Acid reflux   . Allergy bee venom   mainly as a child  . Anxiety 12/08/1969  . Arthritis   . Asthma 12/08/1953   mainly as a child  . Chronic back pain   . Colon cancer ([place]) 11/1999   stage 3 s/p colon resection  . Depression   . H/O Clostridium difficile infection   . History of chemotherapy    5-FU pump/leukovorin  . History of kidney stones   . Hypothyroidism    NO MEDS NOW  . Insomnia   . Multiple myeloma ([place]) 12/2011   smoldering vs mgus  . Nephrolithiasis 08/28/2017  . Osteoporosis   . Seasonal allergies     PAST SURGICAL HISTORY :   Past Surgical History:  Procedure Laterality Date  . BACK SURGERY    . COLON SURGERY  2001   resection, 2nd surgery for scar tissue removal  . COLONOSCOPY    . COLONOSCOPY WITH PROPOFOL N/A 03/26/2016   Procedure: COLONOSCOPY WITH PROPOFOL;  Surgeon: Lollie Sails, MD;  Location: [facility] ENDOSCOPY;  Service: Endoscopy;  Laterality: N/A;  . CYSTOSCOPY WITH URETEROSCOPY AND STENT PLACEMENT Left 02/21/2018   Procedure: URETEROSCOPY, LASER LITHOTRIPSY, STONE REMOVAL AND STENT PLACEMENT;  Surgeon: Abbie Sons, MD;  Location: [facility] ORS;  Service: Urology;  Laterality: Left;  . CYSTOSCOPY/URETEROSCOPY/HOLMIUM LASER/STENT PLACEMENT Right 09/03/2017   Procedure: CYSTOSCOPY/URETEROSCOPY/HOLMIUM LASER/STENT PLACEMENT;  Surgeon: Abbie Sons, MD;  Location: [facility] ORS;  Service: Urology;  Laterality: Right;  . ESOPHAGOGASTRODUODENOSCOPY  10/2010  . EYE SURGERY  1994 and 2010   [place]KA in 8 and [place] in 2010  . FLEXIBLE SIGMOIDOSCOPY N/A 07/06/2018   Procedure: FLEXIBLE SIGMOIDOSCOPY;  Surgeon: Toledo, Benay Pike, MD;  Location: [facility] ENDOSCOPY;  Service:  Gastroenterology;  Laterality: N/A;  . [place]   lower left  . HOLMIUM LASER APPLICATION Left 7/65/4650   Procedure: HOLMIUM LASER APPLICATION;  Surgeon: Abbie Sons, MD;  Location: [facility] ORS;  Service: Urology;  Laterality: Left;  . LUMBAR FUSION     L4-L5, L5-S1  . SACROILIAC JOINT FUSION    . SHOULDER SURGERY Left   . SPINAL CORD STIMULATOR IMPLANT      FAMILY HISTORY :   Family History  Problem Relation Age of Onset  . Lymphoma Mother   . Arthritis Mother        deceased  . Cancer Mother   . Lung cancer Father   . Alcohol abuse Father        deceased  . Cancer Father   . Heart disease Other        grandparents  . Hyperlipidemia Sister   . Diabetes Brother   . Diabetes Son   . Alcohol abuse Brother   . Arthritis Brother   . Depression Brother   . Diabetes Brother   . Alcohol abuse Sister   . Arthritis Sister   . Depression Sister   . Alcohol abuse Son   . Early death Son        truck accident 46 at age 17    SOCIAL HISTORY:   Social History   Tobacco Use  . Smoking status: Former Smoker    Packs/day: 1.00    Years: 10.00    Pack years: 10.00    Types: Cigarettes    Quit date: 03/06/1970    Years since quitting: 48.4  . Smokeless tobacco: Never Used  . Tobacco comment: started at age 89 and quit at age 35  Substance Use Topics  . Alcohol use: No    Frequency: Never  . Drug use: No    ALLERGIES:  is allergic to bee venom.  MEDICATIONS:  Current Outpatient Medications  Medication Sig Dispense Refill  . ibuprofen (ADVIL) 200 MG tablet Take 200 mg by mouth daily.    . traZODone (DESYREL) 100 MG tablet TAKE 1 TABLET BY MOUTH AT BEDTIME AS NEEDED FOR SLEEP 90 tablet 1  . celecoxib (CELEBREX) 200 MG capsule Take 1 capsule (200 mg total) by mouth daily. (Patient not taking: Reported on 07/25/2018) 90 capsule 1  . cholecalciferol (VITAMIN D3) 25 MCG (1000 UT) tablet Take 2,000 Units by mouth daily.     Marland Kitchen dicyclomine (BENTYL) 10 MG capsule  Take 1 capsule (10 mg total) by mouth 2 (two) times daily as needed for spasms. (Patient not taking: Reported on 07/25/2018) 180 capsule 0  . Multiple Vitamins-Minerals (CENTRUM SILVER ULTRA MENS) TABS Take 1 tablet by mouth daily.    Marland Kitchen omeprazole (PRILOSEC) 40 MG capsule Take 1 capsule by mouth once daily (Patient not taking: Reported on 07/25/2018) 90 capsule 0   No current facility-administered medications for this visit.     PHYSICAL EXAMINATION: ECOG PERFORMANCE STATUS: 0 - Asymptomatic  BP 131/78 (BP Location: Left Arm, Patient Position: Sitting)   Pulse 78   Temp 97.9 F (36.6 C) (Tympanic)   Ht 5' 7"  (1.702 m)   Wt 131 lb  3.2 oz (59.5 kg)   BMI 20.55 kg/m   Filed Weights   07/25/18 1042  Weight: 131 lb 3.2 oz (59.5 kg)    Physical Exam  Constitutional: He is oriented to person, place, and time and well-developed, well-nourished, and in no distress.  HENT:  Head: Normocephalic and atraumatic.  Mouth/Throat: Oropharynx is clear and moist. No oropharyngeal exudate.  Eyes: Pupils are equal, round, and reactive to light.  Neck: Normal range of motion. Neck supple.  Cardiovascular: Normal rate and regular rhythm.  Pulmonary/Chest: No respiratory distress. He has no wheezes.  Abdominal: Soft. Bowel sounds are normal. He exhibits no distension and no mass. There is no abdominal tenderness. There is no rebound and no guarding.  Musculoskeletal: Normal range of motion.        General: No tenderness or edema.  Neurological: He is alert and oriented to person, place, and time.  Skin: Skin is warm.  Psychiatric: Affect normal.   LABORATORY DATA:  I have reviewed the data as listed    Component Value Date/Time   NA 138 07/18/2018 1005   K 4.1 07/18/2018 1005   CL 104 07/18/2018 1005   CO2 25 07/18/2018 1005   GLUCOSE 136 (H) 07/18/2018 1005   BUN 28 (H) 07/18/2018 1005   CREATININE 1.30 (H) 07/18/2018 1005   CREATININE 1.27 (H) 11/25/2017 1401   CALCIUM 9.3 07/18/2018 1005    CALCIUM 9.6 12/28/2013 1428   PROT 6.8 07/18/2018 1005   PROT 6.7 06/27/2012 1136   ALBUMIN 4.3 07/18/2018 1005   ALBUMIN 3.5 06/27/2012 1136   AST 20 07/18/2018 1005   AST 17 06/27/2012 1136   ALT 14 07/18/2018 1005   ALT 22 06/27/2012 1136   ALKPHOS 64 07/18/2018 1005   ALKPHOS 91 06/27/2012 1136   BILITOT 0.6 07/18/2018 1005   BILITOT 0.3 06/27/2012 1136   GFRNONAA 56 (L) 07/18/2018 1005   GFRNONAA 58 (L) 11/25/2017 1401   GFRAA >60 07/18/2018 1005   GFRAA 67 11/25/2017 1401    No results found for: SPEP, UPEP  Lab Results  Component Value Date   WBC 6.4 07/18/2018   NEUTROABS 3.6 07/18/2018   HGB 13.7 07/18/2018   HCT 38.8 (L) 07/18/2018   MCV 91.1 07/18/2018   PLT 363 07/18/2018      Chemistry      Component Value Date/Time   NA 138 07/18/2018 1005   K 4.1 07/18/2018 1005   CL 104 07/18/2018 1005   CO2 25 07/18/2018 1005   BUN 28 (H) 07/18/2018 1005   CREATININE 1.30 (H) 07/18/2018 1005   CREATININE 1.27 (H) 11/25/2017 1401      Component Value Date/Time   CALCIUM 9.3 07/18/2018 1005   CALCIUM 9.6 12/28/2013 1428   ALKPHOS 64 07/18/2018 1005   ALKPHOS 91 06/27/2012 1136   AST 20 07/18/2018 1005   AST 17 06/27/2012 1136   ALT 14 07/18/2018 1005   ALT 22 06/27/2012 1136   BILITOT 0.6 07/18/2018 1005   BILITOT 0.3 06/27/2012 1136      RADIOGRAPHIC STUDIES: I have personally reviewed the radiological images as listed and agreed with the findings in the report. No results found.   ASSESSMENT & PLAN:  Smoldering multiple myeloma (SMM) ([place]) # SMOLDERING MULTIPLE MYELOMA [2013 s/p BMBx].  No clinical evidence of progression. Stable.   # M protein 0.3 g/dL; kappa lambda light chain ratio ~9-10.  Hemoglobin calcium normal.  Creatinine stable around 1.3- Stable.   # colon cancer- stage III  s/p colonoscopy- feb 2018; follows with Dr.Anna.   # Arthritis- recommend HOLD off NSAIDS; ok to take tylenol prn as needed.   # ? UTI/kidney stone [Dr.Stoiff]-  s/p Anti-biotics. S/p removal   # DISPOSITION:  # follow up in 6 months/labs-MD-cbc/cmp/MM panel/K-L light chains- 1 week prior-Dr.B    Orders Placed This Encounter  Procedures  . CBC with Differential    Standing Status:   Future    Standing Expiration Date:   07/25/2019  . Comprehensive metabolic panel    Standing Status:   Future    Standing Expiration Date:   07/25/2019  . Multiple Myeloma Panel (SPEP&IFE w/QIG)    Standing Status:   Future    Standing Expiration Date:   07/25/2019  . Kappa/lambda light chains    Standing Status:   Future    Standing Expiration Date:   07/25/2019   All questions were answered. The patient knows to call the clinic with any problems, questions or concerns.      Cammie Sickle, MD 07/25/2018 12:41 PM

## 2018-08-22 ENCOUNTER — Ambulatory Visit: Payer: Medicare Other | Admitting: Urology

## 2018-10-24 ENCOUNTER — Ambulatory Visit: Payer: Medicare Other

## 2018-11-14 ENCOUNTER — Other Ambulatory Visit: Payer: Self-pay

## 2018-11-14 ENCOUNTER — Ambulatory Visit (INDEPENDENT_AMBULATORY_CARE_PROVIDER_SITE_OTHER): Payer: Medicare Other

## 2018-11-14 DIAGNOSIS — Z23 Encounter for immunization: Secondary | ICD-10-CM

## 2018-11-17 DIAGNOSIS — M19042 Primary osteoarthritis, left hand: Secondary | ICD-10-CM | POA: Diagnosis not present

## 2018-11-17 DIAGNOSIS — M19041 Primary osteoarthritis, right hand: Secondary | ICD-10-CM | POA: Diagnosis not present

## 2019-01-15 ENCOUNTER — Other Ambulatory Visit: Payer: Medicare Other

## 2019-01-16 ENCOUNTER — Ambulatory Visit: Payer: Medicare Other

## 2019-01-16 ENCOUNTER — Other Ambulatory Visit: Payer: Self-pay

## 2019-01-16 ENCOUNTER — Inpatient Hospital Stay: Payer: Medicare Other | Attending: Internal Medicine

## 2019-01-16 DIAGNOSIS — E039 Hypothyroidism, unspecified: Secondary | ICD-10-CM | POA: Insufficient documentation

## 2019-01-16 DIAGNOSIS — Z9049 Acquired absence of other specified parts of digestive tract: Secondary | ICD-10-CM | POA: Diagnosis not present

## 2019-01-16 DIAGNOSIS — Z87891 Personal history of nicotine dependence: Secondary | ICD-10-CM | POA: Insufficient documentation

## 2019-01-16 DIAGNOSIS — Z85038 Personal history of other malignant neoplasm of large intestine: Secondary | ICD-10-CM | POA: Diagnosis not present

## 2019-01-16 DIAGNOSIS — Z791 Long term (current) use of non-steroidal anti-inflammatories (NSAID): Secondary | ICD-10-CM | POA: Diagnosis not present

## 2019-01-16 DIAGNOSIS — F329 Major depressive disorder, single episode, unspecified: Secondary | ICD-10-CM | POA: Insufficient documentation

## 2019-01-16 DIAGNOSIS — C9000 Multiple myeloma not having achieved remission: Secondary | ICD-10-CM | POA: Diagnosis not present

## 2019-01-16 DIAGNOSIS — D472 Monoclonal gammopathy: Secondary | ICD-10-CM

## 2019-01-16 DIAGNOSIS — Z79899 Other long term (current) drug therapy: Secondary | ICD-10-CM | POA: Diagnosis not present

## 2019-01-16 LAB — CBC WITH DIFFERENTIAL/PLATELET
Abs Immature Granulocytes: 0.02 10*3/uL (ref 0.00–0.07)
Basophils Absolute: 0 10*3/uL (ref 0.0–0.1)
Basophils Relative: 0 %
Eosinophils Absolute: 0.1 10*3/uL (ref 0.0–0.5)
Eosinophils Relative: 2 %
HCT: 40.9 % (ref 39.0–52.0)
Hemoglobin: 13.8 g/dL (ref 13.0–17.0)
Immature Granulocytes: 0 %
Lymphocytes Relative: 33 %
Lymphs Abs: 2.1 10*3/uL (ref 0.7–4.0)
MCH: 32.5 pg (ref 26.0–34.0)
MCHC: 33.7 g/dL (ref 30.0–36.0)
MCV: 96.2 fL (ref 80.0–100.0)
Monocytes Absolute: 0.5 10*3/uL (ref 0.1–1.0)
Monocytes Relative: 8 %
Neutro Abs: 3.6 10*3/uL (ref 1.7–7.7)
Neutrophils Relative %: 57 %
Platelets: 362 10*3/uL (ref 150–400)
RBC: 4.25 MIL/uL (ref 4.22–5.81)
RDW: 12 % (ref 11.5–15.5)
WBC: 6.3 10*3/uL (ref 4.0–10.5)
nRBC: 0 % (ref 0.0–0.2)

## 2019-01-16 LAB — COMPREHENSIVE METABOLIC PANEL
ALT: 27 U/L (ref 0–44)
AST: 29 U/L (ref 15–41)
Albumin: 4.2 g/dL (ref 3.5–5.0)
Alkaline Phosphatase: 76 U/L (ref 38–126)
Anion gap: 8 (ref 5–15)
BUN: 20 mg/dL (ref 8–23)
CO2: 27 mmol/L (ref 22–32)
Calcium: 9.1 mg/dL (ref 8.9–10.3)
Chloride: 104 mmol/L (ref 98–111)
Creatinine, Ser: 1.18 mg/dL (ref 0.61–1.24)
GFR calc Af Amer: >60 mL/min (ref >60)
GFR calc non Af Amer: >60 mL/min (ref >60)
Glucose, Bld: 143 mg/dL — ABNORMAL HIGH (ref 70–99)
Potassium: 3.9 mmol/L (ref 3.5–5.1)
Sodium: 139 mmol/L (ref 135–145)
Total Bilirubin: 0.5 mg/dL (ref 0.3–1.2)
Total Protein: 7 g/dL (ref 6.5–8.1)

## 2019-01-19 LAB — KAPPA/LAMBDA LIGHT CHAINS
Kappa free light chain: 111.4 mg/L — ABNORMAL HIGH (ref 3.3–19.4)
Kappa, lambda light chain ratio: 7.43 — ABNORMAL HIGH (ref 0.26–1.65)
Lambda free light chains: 15 mg/L (ref 5.7–26.3)

## 2019-01-21 LAB — MULTIPLE MYELOMA PANEL, SERUM
Albumin SerPl Elph-Mcnc: 3.9 g/dL (ref 2.9–4.4)
Albumin/Glob SerPl: 1.7 (ref 0.7–1.7)
Alpha 1: 0.2 g/dL (ref 0.0–0.4)
Alpha2 Glob SerPl Elph-Mcnc: 0.6 g/dL (ref 0.4–1.0)
B-Globulin SerPl Elph-Mcnc: 0.9 g/dL (ref 0.7–1.3)
Gamma Glob SerPl Elph-Mcnc: 0.8 g/dL (ref 0.4–1.8)
Globulin, Total: 2.4 g/dL (ref 2.2–3.9)
IgA: 323 mg/dL (ref 61–437)
IgG (Immunoglobin G), Serum: 650 mg/dL (ref 603–1613)
IgM (Immunoglobulin M), Srm: 45 mg/dL (ref 20–172)
M Protein SerPl Elph-Mcnc: 0.4 g/dL — ABNORMAL HIGH
Total Protein ELP: 6.3 g/dL (ref 6.0–8.5)

## 2019-01-22 ENCOUNTER — Other Ambulatory Visit: Payer: Self-pay

## 2019-01-23 ENCOUNTER — Other Ambulatory Visit: Payer: Self-pay

## 2019-01-23 ENCOUNTER — Inpatient Hospital Stay (HOSPITAL_BASED_OUTPATIENT_CLINIC_OR_DEPARTMENT_OTHER): Payer: Medicare Other | Admitting: Internal Medicine

## 2019-01-23 DIAGNOSIS — C9000 Multiple myeloma not having achieved remission: Secondary | ICD-10-CM

## 2019-01-23 DIAGNOSIS — D472 Monoclonal gammopathy: Secondary | ICD-10-CM

## 2019-01-23 NOTE — Progress Notes (Signed)
[place] OFFICE PROGRESS NOTE  Patient Care Team: Hubbard Hartshorn, FNP as PCP - General (Family Medicine) Cammie Sickle, MD as Consulting Physician (Internal Medicine) Lollie Sails, MD as Consulting Physician (Gastroenterology)  Cancer Staging No matching staging information was found for the patient.   Oncology History Overview Note  # NOV 2013- SMOLDERING MULTIPLE MYELOMA [IgA Kappa; BMBx- 10-12% plasma cell;Dr.Trillo [place]]   # 2001- COLON CA STAGE III [s/p chemo; [place]]  # colonoscopy- 2018/ Dr.Anna  # ? UTI/kidney stone [Dr.Stoiff]- s/p Anti-biotics. S/p removal   DIAGNOSIS: Smoldering myeloma  GOALS: Cure  CURRENT/MOST RECENT THERAPY: Surveillance    Smoldering multiple myeloma (SMM) (HCC)   INTERVAL HISTORY:  [name] 68 y.o.  male pleasant patient above history of smoldering myeloma currently on surveillance is here for follow-up.  Patient continues to play golf.  He denies any unusual joint pains or bone pain.  Has chronic arthritis for which he is currently taking Tylenol 2 pills a day in the morning.  He stopped taking Advil's.   Review of Systems  Constitutional: Negative for chills, diaphoresis, fever, malaise/fatigue and weight loss.  HENT: Negative for nosebleeds and sore throat.   Eyes: Negative for double vision.  Respiratory: Negative for cough, hemoptysis, sputum production, shortness of breath and wheezing.   Cardiovascular: Negative for chest pain, palpitations, orthopnea and leg swelling.  Gastrointestinal: Negative for abdominal pain, blood in stool, constipation, diarrhea, heartburn, melena, nausea and vomiting.  Genitourinary: Negative for dysuria, frequency and urgency.  Musculoskeletal: Positive for back pain and joint pain.  Skin: Negative.  Negative for itching and rash.  Neurological: Negative for dizziness, tingling, focal weakness, weakness and headaches.  Endo/Heme/Allergies: Does not bruise/bleed  easily.  Psychiatric/Behavioral: Negative for depression. The patient is not nervous/anxious and does not have insomnia.       PAST MEDICAL HISTORY :  Past Medical History:  Diagnosis Date  . Abnormal laboratory test    Sees Dr. Ma Hillock  . Acid reflux   . Allergy bee venom   mainly as a child  . Anxiety 12/08/1969  . Arthritis   . Asthma 12/08/1953   mainly as a child  . Chronic back pain   . Colon cancer ([place]) 11/1999   stage 3 s/p colon resection  . Depression   . H/O Clostridium difficile infection   . History of chemotherapy    5-FU pump/leukovorin  . History of kidney stones   . Hypothyroidism    NO MEDS NOW  . Insomnia   . Multiple myeloma ([place]) 12/2011   smoldering vs mgus  . Nephrolithiasis 08/28/2017  . Osteoporosis   . Seasonal allergies     PAST SURGICAL HISTORY :   Past Surgical History:  Procedure Laterality Date  . BACK SURGERY    . COLON SURGERY  2001   resection, 2nd surgery for scar tissue removal  . COLONOSCOPY    . COLONOSCOPY WITH PROPOFOL N/A 03/26/2016   Procedure: COLONOSCOPY WITH PROPOFOL;  Surgeon: Lollie Sails, MD;  Location: [facility] ENDOSCOPY;  Service: Endoscopy;  Laterality: N/A;  . CYSTOSCOPY WITH URETEROSCOPY AND STENT PLACEMENT Left 02/21/2018   Procedure: URETEROSCOPY, LASER LITHOTRIPSY, STONE REMOVAL AND STENT PLACEMENT;  Surgeon: Abbie Sons, MD;  Location: [facility] ORS;  Service: Urology;  Laterality: Left;  . CYSTOSCOPY/URETEROSCOPY/HOLMIUM LASER/STENT PLACEMENT Right 09/03/2017   Procedure: CYSTOSCOPY/URETEROSCOPY/HOLMIUM LASER/STENT PLACEMENT;  Surgeon: Abbie Sons, MD;  Location: [facility] ORS;  Service: Urology;  Laterality: Right;  . ESOPHAGOGASTRODUODENOSCOPY  10/2010  . EYE SURGERY  1994 and 2010   RKA in 62 and [place] in 2010  . FLEXIBLE SIGMOIDOSCOPY N/A 07/06/2018   Procedure: FLEXIBLE SIGMOIDOSCOPY;  Surgeon: Toledo, Benay Pike, MD;  Location: [facility] ENDOSCOPY;  Service: Gastroenterology;  Laterality: N/A;  . [place]   lower left  . HOLMIUM LASER APPLICATION Left 6/81/2751   Procedure: HOLMIUM LASER APPLICATION;  Surgeon: Abbie Sons, MD;  Location: [facility] ORS;  Service: Urology;  Laterality: Left;  . LUMBAR FUSION     L4-L5, L5-S1  . SACROILIAC JOINT FUSION    . SHOULDER SURGERY Left   . SPINAL CORD STIMULATOR IMPLANT      FAMILY HISTORY :   Family History  Problem Relation Age of Onset  . Lymphoma Mother   . Arthritis Mother        deceased  . Cancer Mother   . Lung cancer Father   . Alcohol abuse Father        deceased  . Cancer Father   . Heart disease Other        grandparents  . Hyperlipidemia Sister   . Diabetes Brother   . Diabetes Son   . Alcohol abuse Brother   . Arthritis Brother   . Depression Brother   . Diabetes Brother   . Alcohol abuse Sister   . Arthritis Sister   . Depression Sister   . Alcohol abuse Son   . Early death Son        truck accident 63 at age 74    SOCIAL HISTORY:   Social History   Tobacco Use  . Smoking status: Former Smoker    Packs/day: 1.00    Years: 10.00    Pack years: 10.00    Types: Cigarettes    Quit date: 03/06/1970    Years since quitting: 48.9  . Smokeless tobacco: Never Used  . Tobacco comment: started at age 29 and quit at age 20  Substance Use Topics  . Alcohol use: No  . Drug use: No    ALLERGIES:  is allergic to bee venom.  MEDICATIONS:  Current Outpatient Medications  Medication Sig Dispense Refill  . cholecalciferol (VITAMIN D3) 25 MCG (1000 UT) tablet Take 2,000 Units by mouth daily.     . Multiple Vitamins-Minerals (CENTRUM SILVER ULTRA MENS) TABS Take 1 tablet by mouth daily.    . traZODone (DESYREL) 100 MG tablet TAKE 1 TABLET BY MOUTH AT BEDTIME AS NEEDED FOR SLEEP 90 tablet 1   No current facility-administered medications for this visit.    PHYSICAL EXAMINATION: ECOG PERFORMANCE STATUS: 0 - Asymptomatic  BP (!) 158/91 (BP Location: Left Arm, Patient Position: Sitting)   Pulse 62   Temp 98.1  F (36.7 C) (Tympanic)   Resp 18   Wt 133 lb (60.3 kg)   BMI 20.83 kg/m   Filed Weights   01/23/19 0955  Weight: 133 lb (60.3 kg)    Physical Exam  Constitutional: He is oriented to person, place, and time and well-developed, well-nourished, and in no distress.  HENT:  Head: Normocephalic and atraumatic.  Mouth/Throat: Oropharynx is clear and moist. No oropharyngeal exudate.  Eyes: Pupils are equal, round, and reactive to light.  Cardiovascular: Normal rate and regular rhythm.  Pulmonary/Chest: No respiratory distress. He has no wheezes.  Abdominal: Soft. Bowel sounds are normal. He exhibits no distension and no mass. There is no abdominal tenderness. There is no rebound and no guarding.  Musculoskeletal:  General: No tenderness or edema. Normal range of motion.     Cervical back: Normal range of motion and neck supple.  Neurological: He is alert and oriented to person, place, and time.  Skin: Skin is warm.  Psychiatric: Affect normal.   LABORATORY DATA:  I have reviewed the data as listed    Component Value Date/Time   NA 139 01/16/2019 1330   K 3.9 01/16/2019 1330   CL 104 01/16/2019 1330   CO2 27 01/16/2019 1330   GLUCOSE 143 (H) 01/16/2019 1330   BUN 20 01/16/2019 1330   CREATININE 1.18 01/16/2019 1330   CREATININE 1.27 (H) 11/25/2017 1401   CALCIUM 9.1 01/16/2019 1330   CALCIUM 9.6 12/28/2013 1428   PROT 7.0 01/16/2019 1330   PROT 6.7 06/27/2012 1136   ALBUMIN 4.2 01/16/2019 1330   ALBUMIN 3.5 06/27/2012 1136   AST 29 01/16/2019 1330   AST 17 06/27/2012 1136   ALT 27 01/16/2019 1330   ALT 22 06/27/2012 1136   ALKPHOS 76 01/16/2019 1330   ALKPHOS 91 06/27/2012 1136   BILITOT 0.5 01/16/2019 1330   BILITOT 0.3 06/27/2012 1136   GFRNONAA >60 01/16/2019 1330   GFRNONAA 58 (L) 11/25/2017 1401   GFRAA >60 01/16/2019 1330   GFRAA 67 11/25/2017 1401    No results found for: SPEP, UPEP  Lab Results  Component Value Date   WBC 6.3 01/16/2019    NEUTROABS 3.6 01/16/2019   HGB 13.8 01/16/2019   HCT 40.9 01/16/2019   MCV 96.2 01/16/2019   PLT 362 01/16/2019      Chemistry      Component Value Date/Time   NA 139 01/16/2019 1330   K 3.9 01/16/2019 1330   CL 104 01/16/2019 1330   CO2 27 01/16/2019 1330   BUN 20 01/16/2019 1330   CREATININE 1.18 01/16/2019 1330   CREATININE 1.27 (H) 11/25/2017 1401      Component Value Date/Time   CALCIUM 9.1 01/16/2019 1330   CALCIUM 9.6 12/28/2013 1428   ALKPHOS 76 01/16/2019 1330   ALKPHOS 91 06/27/2012 1136   AST 29 01/16/2019 1330   AST 17 06/27/2012 1136   ALT 27 01/16/2019 1330   ALT 22 06/27/2012 1136   BILITOT 0.5 01/16/2019 1330   BILITOT 0.3 06/27/2012 1136      RADIOGRAPHIC STUDIES: I have personally reviewed the radiological images as listed and agreed with the findings in the report. No results found.   ASSESSMENT & PLAN:  Smoldering multiple myeloma (SMM) ([place]) # SMOLDERING MULTIPLE MYELOMA [2013 s/p BMBx].  No clinical evidence of progression.  Clinically STABLE.  # M protein 0.4 g/dL; kappa lambda light chain ratio ~7; overall stable  # Arthritis- recommend HOLD off NSAIDS; ok to take tylenol prn as needed. STABLE>   # DISPOSITION:  # follow up in 6 months/labs-MD-cbc/cmp/MM panel/K-L light chains- 1 week prior-Dr.B    Orders Placed This Encounter  Procedures  . CBC with Differential    Standing Status:   Future    Standing Expiration Date:   01/23/2020  . Comprehensive metabolic panel    Standing Status:   Future    Standing Expiration Date:   01/23/2020  . Multiple Myeloma Panel (SPEP&IFE w/QIG)    Standing Status:   Future    Standing Expiration Date:   01/23/2020  . Kappa/lambda light chains    Standing Status:   Future    Standing Expiration Date:   01/23/2020   All questions were answered. The patient knows  to call the clinic with any problems, questions or concerns.      Cammie Sickle, MD 01/23/2019 12:43 PM

## 2019-01-23 NOTE — Assessment & Plan Note (Addendum)
#  SMOLDERING MULTIPLE MYELOMA [2013 s/p BMBx].  No clinical evidence of progression.  Clinically STABLE.  # M protein 0.4 g/dL; kappa lambda light chain ratio ~7; overall stable  # Arthritis- recommend HOLD off NSAIDS; ok to take tylenol prn as needed. STABLE>   # DISPOSITION:  # follow up in 6 months/labs-MD-cbc/cmp/MM panel/K-L light chains- 1 week prior-Dr.B

## 2019-02-13 ENCOUNTER — Other Ambulatory Visit: Payer: Self-pay

## 2019-02-13 ENCOUNTER — Ambulatory Visit (INDEPENDENT_AMBULATORY_CARE_PROVIDER_SITE_OTHER): Payer: Medicare Other

## 2019-02-13 VITALS — BP 133/104 | HR 66 | Temp 97.7°F | Ht 67.0 in | Wt 133.0 lb

## 2019-02-13 DIAGNOSIS — Z0000 Encounter for general adult medical examination without abnormal findings: Secondary | ICD-10-CM | POA: Diagnosis not present
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
## 2019-02-13 NOTE — Patient Instructions (Addendum)
[name] , Thank you for taking time to come for your Medicare Wellness Visit. I appreciate your ongoing commitment to your health goals. Please review the following plan we discussed and let me know if I can assist you in the future.   Screening recommendations/referrals: Colonoscopy: done 03/16/16 Recommended yearly ophthalmology/optometry visit for glaucoma screening and checkup Recommended yearly dental visit for hygiene and checkup  Vaccinations:  Influenza vaccine: done 11/14/18 Pneumococcal vaccine: done 01/09/17 Tdap vaccine: done 07/06/12 Shingles vaccine: Shingrix discussed. Please contact your pharmacy for coverage information.   Advanced directives: Please bring a copy of your health care power of attorney and living will to the office at your convenience.  Conditions/risks identified: Recommend drinking 6-8 glasses of water per day  Next appointment: Please follow up in one year for your Medicare Annual Wellness visit.    Preventive Care 72 Years and Older, Male Preventive care refers to lifestyle choices and visits with your health care provider that can promote health and wellness. What does preventive care include?  A yearly physical exam. This is also called an annual well check.  Dental exams once or twice a year.  Routine eye exams. Ask your health care provider how often you should have your eyes checked.  Personal lifestyle choices, including:  Daily care of your teeth and gums.  Regular physical activity.  Eating a healthy diet.  Avoiding tobacco and drug use.  Limiting alcohol use.  Practicing safe sex.  Taking low doses of aspirin every day.  Taking vitamin and mineral supplements as recommended by your health care provider. What happens during an annual well check? The services and screenings done by your health care provider during your annual well check will depend on your age, overall health, lifestyle risk factors, and family history of  disease. Counseling  Your health care provider may ask you questions about your:  Alcohol use.  Tobacco use.  Drug use.  Emotional well-being.  Home and relationship well-being.  Sexual activity.  Eating habits.  History of falls.  Memory and ability to understand (cognition).  Work and work Statistician. Screening  You may have the following tests or measurements:  Height, weight, and BMI.  Blood pressure.  Lipid and cholesterol levels. These may be checked every 5 years, or more frequently if you are over 65 years old.  Skin check.  Lung cancer screening. You may have this screening every year starting at age 1 if you have a 30-pack-year history of smoking and currently smoke or have quit within the past 15 years.  Fecal occult blood test (FOBT) of the stool. You may have this test every year starting at age 16.  Flexible sigmoidoscopy or colonoscopy. You may have a sigmoidoscopy every 5 years or a colonoscopy every 10 years starting at age 58.  Prostate cancer screening. Recommendations will vary depending on your family history and other risks.  Hepatitis C blood test.  Hepatitis B blood test.  Sexually transmitted disease (STD) testing.  Diabetes screening. This is done by checking your blood sugar (glucose) after you have not eaten for a while (fasting). You may have this done every 1-3 years.  Abdominal aortic aneurysm (AAA) screening. You may need this if you are a current or former smoker.  Osteoporosis. You may be screened starting at age 35 if you are at high risk. Talk with your health care provider about your test results, treatment options, and if necessary, the need for more tests. Vaccines  Your health care  provider may recommend certain vaccines, such as:  Influenza vaccine. This is recommended every year.  Tetanus, diphtheria, and acellular pertussis (Tdap, Td) vaccine. You may need a Td booster every 10 years.  Zoster vaccine. You may  need this after age 60.  Pneumococcal 13-valent conjugate (PCV13) vaccine. One dose is recommended after age 1.  Pneumococcal polysaccharide (PPSV23) vaccine. One dose is recommended after age 37. Talk to your health care provider about which screenings and vaccines you need and how often you need them. This information is not intended to replace advice given to you by your health care provider. Make sure you discuss any questions you have with your health care provider. Document Released: 02/18/2015 Document Revised: 10/12/2015 Document Reviewed: 11/23/2014 Elsevier Interactive Patient Education  2017 [place] Prevention in the Home Falls can cause injuries. They can happen to people of all ages. There are many things you can do to make your home safe and to help prevent falls. What can I do on the outside of my home?  Regularly fix the edges of walkways and driveways and fix any cracks.  Remove anything that might make you trip as you walk through a door, such as a raised step or threshold.  Trim any bushes or trees on the path to your home.  Use bright outdoor lighting.  Clear any walking paths of anything that might make someone trip, such as rocks or tools.  Regularly check to see if handrails are loose or broken. Make sure that both sides of any steps have handrails.  Any raised decks and porches should have guardrails on the edges.  Have any leaves, snow, or ice cleared regularly.  Use sand or salt on walking paths during winter.  Clean up any spills in your garage right away. This includes oil or grease spills. What can I do in the bathroom?  Use night lights.  Install grab bars by the toilet and in the tub and shower. Do not use towel bars as grab bars.  Use non-skid mats or decals in the tub or shower.  If you need to sit down in the shower, use a plastic, non-slip stool.  Keep the floor dry. Clean up any water that spills on the floor as soon as it  happens.  Remove soap buildup in the tub or shower regularly.  Attach bath mats securely with double-sided non-slip rug tape.  Do not have throw rugs and other things on the floor that can make you trip. What can I do in the bedroom?  Use night lights.  Make sure that you have a light by your bed that is easy to reach.  Do not use any sheets or blankets that are too big for your bed. They should not hang down onto the floor.  Have a firm chair that has side arms. You can use this for support while you get dressed.  Do not have throw rugs and other things on the floor that can make you trip. What can I do in the kitchen?  Clean up any spills right away.  Avoid walking on wet floors.  Keep items that you use a lot in easy-to-reach places.  If you need to reach something above you, use a strong step stool that has a grab bar.  Keep electrical cords out of the way.  Do not use floor polish or wax that makes floors slippery. If you must use wax, use non-skid floor wax.  Do not have throw  rugs and other things on the floor that can make you trip. What can I do with my stairs?  Do not leave any items on the stairs.  Make sure that there are handrails on both sides of the stairs and use them. Fix handrails that are broken or loose. Make sure that handrails are as long as the stairways.  Check any carpeting to make sure that it is firmly attached to the stairs. Fix any carpet that is loose or worn.  Avoid having throw rugs at the top or bottom of the stairs. If you do have throw rugs, attach them to the floor with carpet tape.  Make sure that you have a light switch at the top of the stairs and the bottom of the stairs. If you do not have them, ask someone to add them for you. What else can I do to help prevent falls?  Wear shoes that:  Do not have high heels.  Have rubber bottoms.  Are comfortable and fit you well.  Are closed at the toe. Do not wear sandals.  If you  use a stepladder:  Make sure that it is fully opened. Do not climb a closed stepladder.  Make sure that both sides of the stepladder are locked into place.  Ask someone to hold it for you, if possible.  Clearly [name] and make sure that you can see:  Any grab bars or handrails.  First and last steps.  Where the edge of each step is.  Use tools that help you move around (mobility aids) if they are needed. These include:  Canes.  Walkers.  Scooters.  Crutches.  Turn on the lights when you go into a dark area. Replace any light bulbs as soon as they burn out.  Set up your furniture so you have a clear path. Avoid moving your furniture around.  If any of your floors are uneven, fix them.  If there are any pets around you, be aware of where they are.  Review your medicines with your doctor. Some medicines can make you feel dizzy. This can increase your chance of falling. Ask your doctor what other things that you can do to help prevent falls. This information is not intended to replace advice given to you by your health care provider. Make sure you discuss any questions you have with your health care provider. Document Released: 11/18/2008 Document Revised: 06/30/2015 Document Reviewed: 02/26/2014 Elsevier Interactive Patient Education  2017 [employer].

## 2019-02-27 ENCOUNTER — Encounter: Payer: Self-pay | Admitting: Family Medicine

## 2019-04-03 IMAGING — CT CT ABD-PELV W/ CM
1 of 3 series · 14 of 32 positions shown, 19 images · IV contrast (APPLIED)
Comparison: 09/12/2017

CLINICAL DATA: Left lower quadrant pain

EXAM:
CT ABDOMEN AND PELVIS WITH CONTRAST
TECHNIQUE: Multidetector CT imaging of the abdomen and pelvis was performed
using the standard protocol following bolus administration of
intravenous contrast.
CONTRAST:  80mL OMNIPAQUE IOHEXOL 300 MG/ML  SOLN

[Series 2: axial st · axial · 0.67mm/px · z∈[-1038,-668]mm · 14 of 84 slices shown, 19 images]
[im 5/84  soft-tissue]
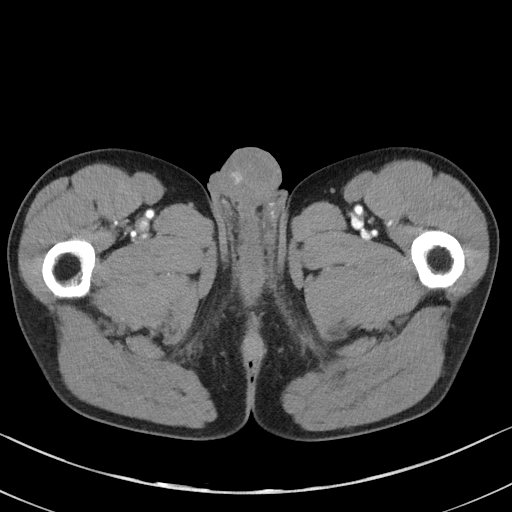
[im 5/84  bone]
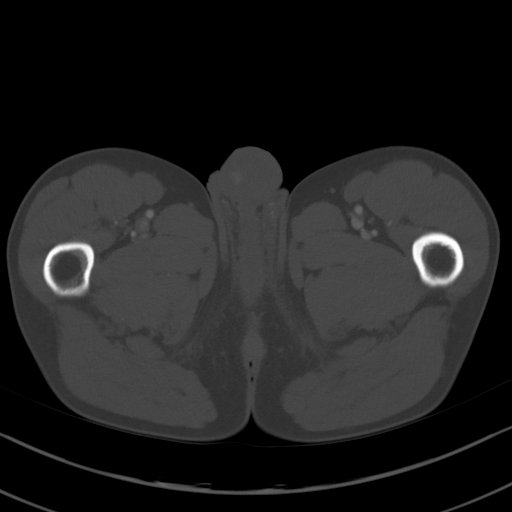
[im 14/84  soft-tissue]
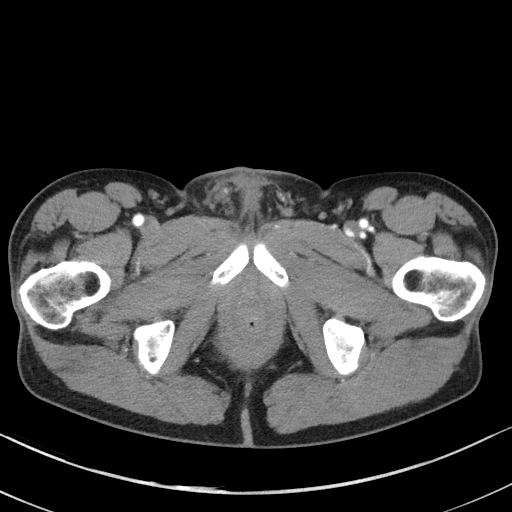
[im 18/84  soft-tissue]
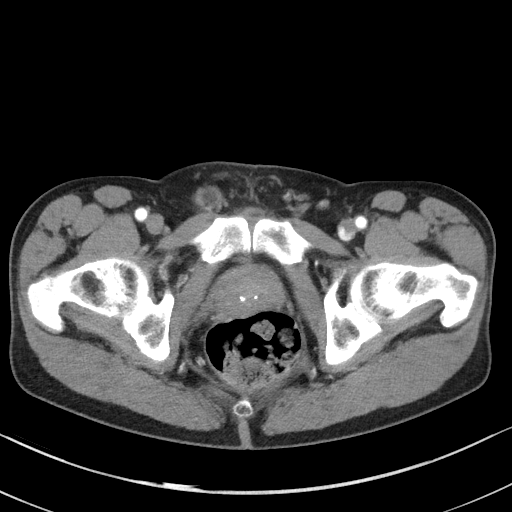
[im 22/84  soft-tissue]
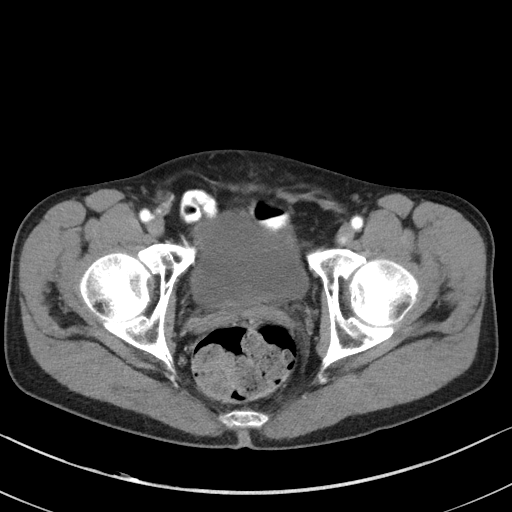
[im 31/84  soft-tissue]
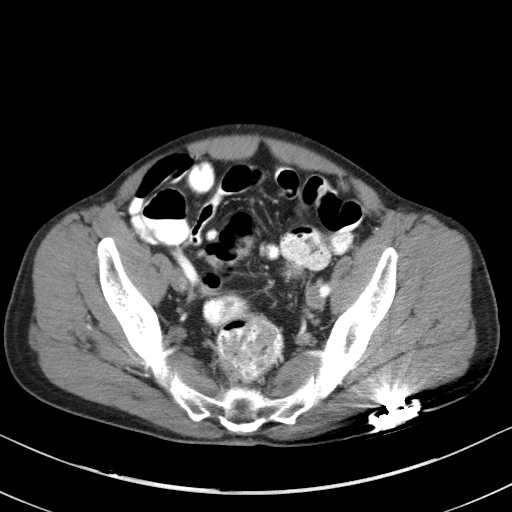
[im 35/84  soft-tissue]
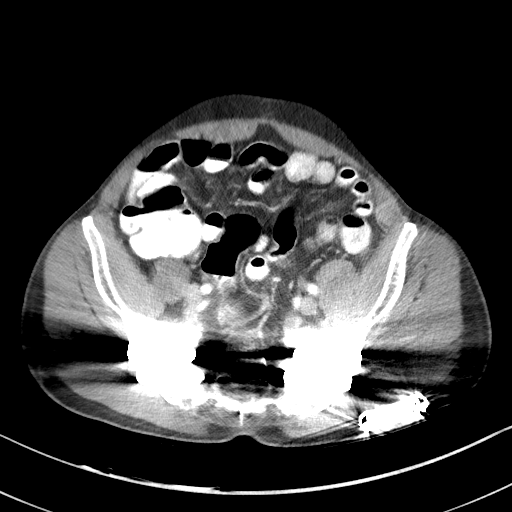
[im 44/84  soft-tissue]
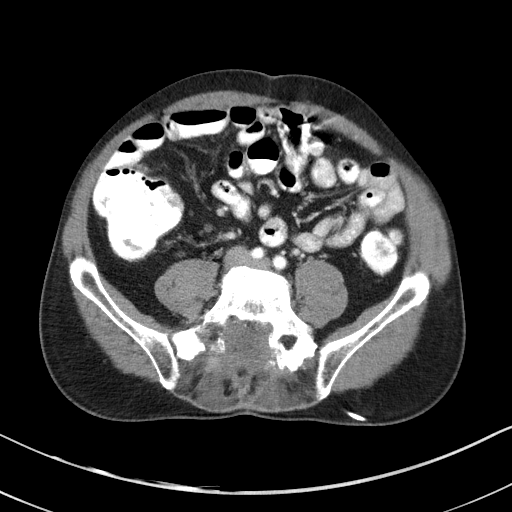
[im 49/84  soft-tissue]
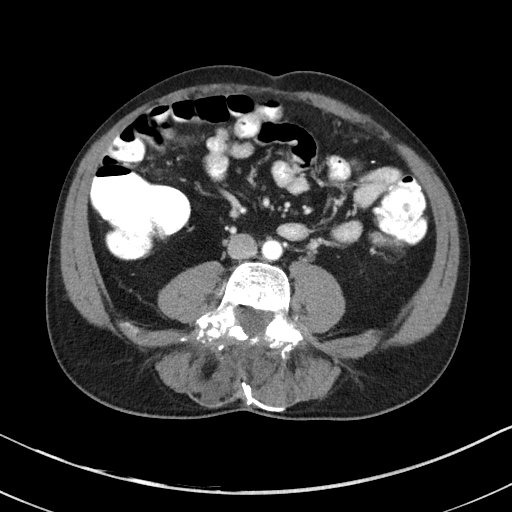
[im 53/84  soft-tissue]
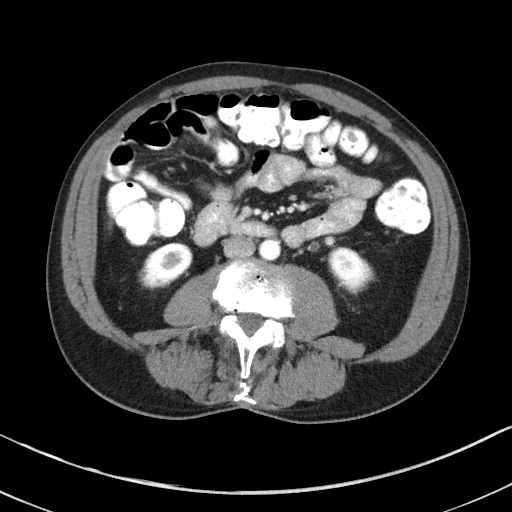
[im 53/84  bone]
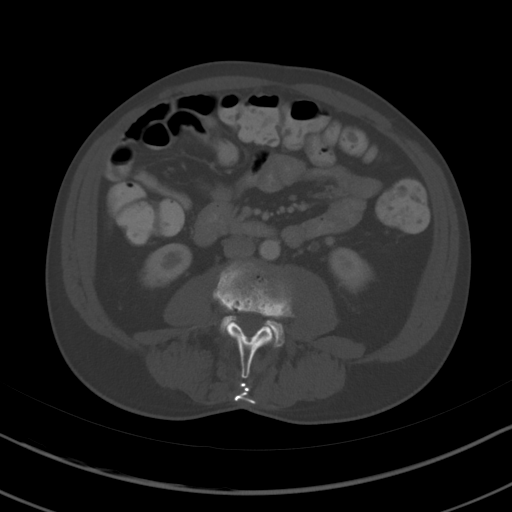
[im 62/84  soft-tissue]
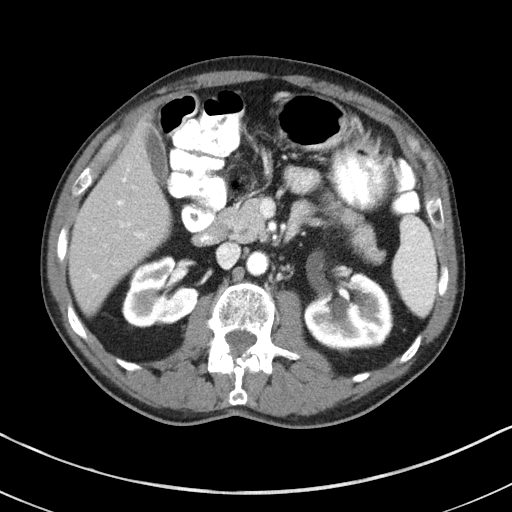
[im 66/84  soft-tissue]
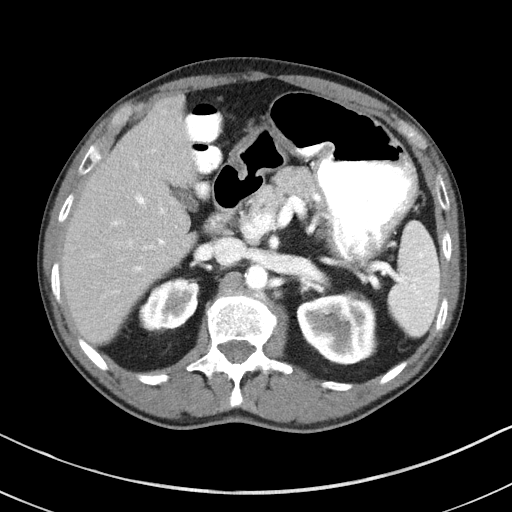
[im 66/84  lung]
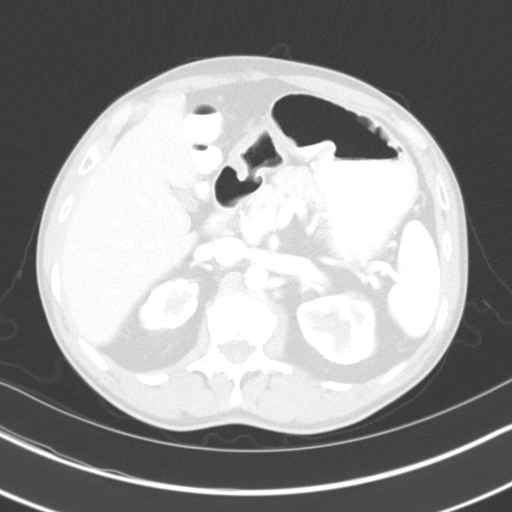
[im 70/84  soft-tissue]
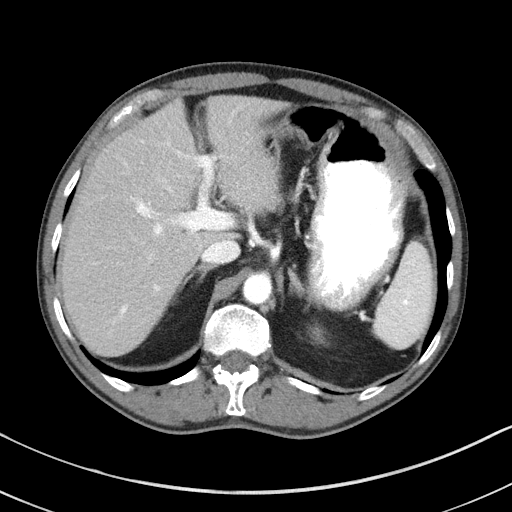
[im 70/84  lung]
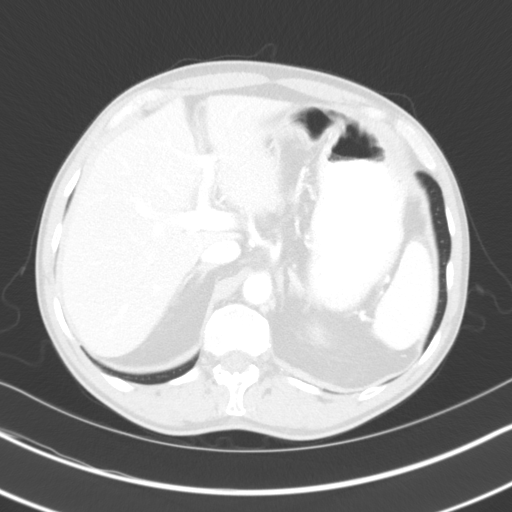
[im 75/84  lung]
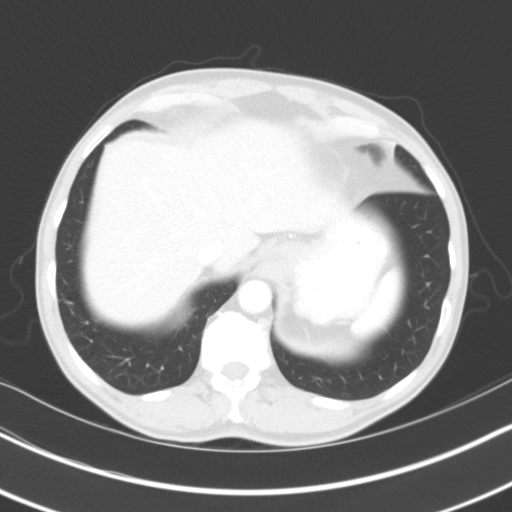
[im 79/84  soft-tissue]
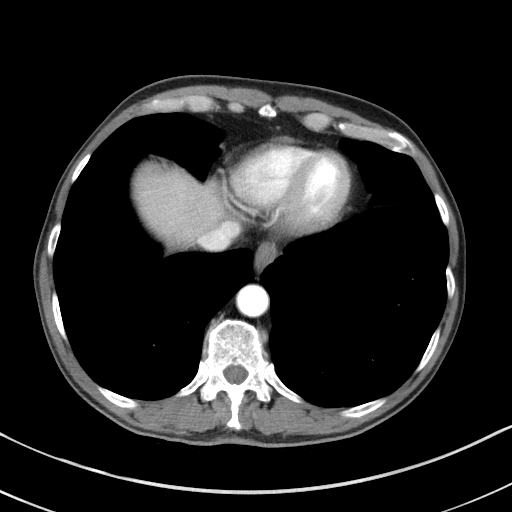
[im 79/84  lung]
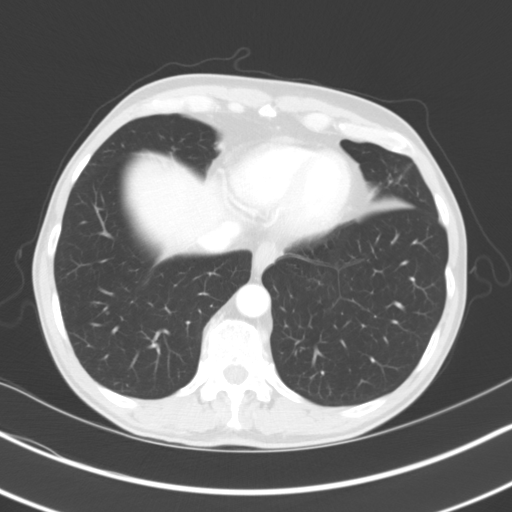

[14 of 32 positions shown; findings below may reference images not displayed]

FINDINGS: Lower chest: Lung bases are clear. No effusions. Heart is normal
size.

Hepatobiliary: Diffuse low-density throughout the liver compatible
with fatty infiltration. No focal abnormality. Gallbladder
unremarkable.

Pancreas: No focal abnormality or ductal dilatation.

Spleen: No focal abnormality.  Normal size.

Adrenals/Urinary Tract: Areas of cortical thinning and scarring in
the upper pole of the right kidney. No mass. Mild left
hydronephrosis due to 11 x 5 mm proximal left ureteral stone.
Adrenal glands and urinary bladder unremarkable.

Stomach/Bowel: Moderate stool burden in the rectosigmoid colon.
Stomach, large and small bowel grossly unremarkable.

Vascular/Lymphatic: No evidence of aneurysm or adenopathy.

Reproductive: Mild. Prominence of the prostate with central
calcifications

Other: No free fluid or free air. Rim calcified area anterior to the
left kidney measures up to 3 cm and is unchanged since prior study
and may reflect old fat necrosis or hematoma.

Musculoskeletal: Postoperative changes in the pelvis from placement
of bars across the SI joints bilaterally. Postoperative changes in
the lower lumbar spine. Spinal stimulator device in place.
IMPRESSION: 11 x 5 mm proximal left ureteral stone with mild left
hydronephrosis.

Mild fatty infiltration of the liver.

## 2019-05-06 ENCOUNTER — Other Ambulatory Visit: Payer: Self-pay | Admitting: Family Medicine

## 2019-05-06 DIAGNOSIS — F5101 Primary insomnia: Secondary | ICD-10-CM

## 2019-07-17 ENCOUNTER — Inpatient Hospital Stay: Payer: Medicare Other | Attending: Internal Medicine

## 2019-07-17 ENCOUNTER — Other Ambulatory Visit: Payer: Self-pay

## 2019-07-17 DIAGNOSIS — Z79899 Other long term (current) drug therapy: Secondary | ICD-10-CM | POA: Insufficient documentation

## 2019-07-17 DIAGNOSIS — C9000 Multiple myeloma not having achieved remission: Secondary | ICD-10-CM | POA: Diagnosis not present

## 2019-07-17 DIAGNOSIS — D472 Monoclonal gammopathy: Secondary | ICD-10-CM

## 2019-07-17 DIAGNOSIS — Z85038 Personal history of other malignant neoplasm of large intestine: Secondary | ICD-10-CM | POA: Diagnosis not present

## 2019-07-17 LAB — COMPREHENSIVE METABOLIC PANEL
ALT: 21 U/L (ref 0–44)
AST: 22 U/L (ref 15–41)
Albumin: 4.2 g/dL (ref 3.5–5.0)
Alkaline Phosphatase: 69 U/L (ref 38–126)
Anion gap: 8 (ref 5–15)
BUN: 18 mg/dL (ref 8–23)
CO2: 28 mmol/L (ref 22–32)
Calcium: 9.2 mg/dL (ref 8.9–10.3)
Chloride: 101 mmol/L (ref 98–111)
Creatinine, Ser: 1.21 mg/dL (ref 0.61–1.24)
GFR calc Af Amer: >60 mL/min (ref >60)
GFR calc non Af Amer: >60 mL/min (ref >60)
Glucose, Bld: 104 mg/dL — ABNORMAL HIGH (ref 70–99)
Potassium: 4.2 mmol/L (ref 3.5–5.1)
Sodium: 137 mmol/L (ref 135–145)
Total Bilirubin: 0.8 mg/dL (ref 0.3–1.2)
Total Protein: 7 g/dL (ref 6.5–8.1)

## 2019-07-17 LAB — CBC WITH DIFFERENTIAL/PLATELET
Abs Immature Granulocytes: 0.02 10*3/uL (ref 0.00–0.07)
Basophils Absolute: 0 10*3/uL (ref 0.0–0.1)
Basophils Relative: 0 %
Eosinophils Absolute: 0.2 10*3/uL (ref 0.0–0.5)
Eosinophils Relative: 3 %
HCT: 42.2 % (ref 39.0–52.0)
Hemoglobin: 14.6 g/dL (ref 13.0–17.0)
Immature Granulocytes: 0 %
Lymphocytes Relative: 34 %
Lymphs Abs: 2.6 10*3/uL (ref 0.7–4.0)
MCH: 32.7 pg (ref 26.0–34.0)
MCHC: 34.6 g/dL (ref 30.0–36.0)
MCV: 94.6 fL (ref 80.0–100.0)
Monocytes Absolute: 0.8 10*3/uL (ref 0.1–1.0)
Monocytes Relative: 10 %
Neutro Abs: 4 10*3/uL (ref 1.7–7.7)
Neutrophils Relative %: 53 %
Platelets: 332 10*3/uL (ref 150–400)
RBC: 4.46 MIL/uL (ref 4.22–5.81)
RDW: 12 % (ref 11.5–15.5)
WBC: 7.6 10*3/uL (ref 4.0–10.5)
nRBC: 0 % (ref 0.0–0.2)

## 2019-07-20 LAB — KAPPA/LAMBDA LIGHT CHAINS
Kappa free light chain: 129 mg/L — ABNORMAL HIGH (ref 3.3–19.4)
Kappa, lambda light chain ratio: 8.54 — ABNORMAL HIGH (ref 0.26–1.65)
Lambda free light chains: 15.1 mg/L (ref 5.7–26.3)

## 2019-07-21 LAB — MULTIPLE MYELOMA PANEL, SERUM
Albumin SerPl Elph-Mcnc: 3.9 g/dL (ref 2.9–4.4)
Albumin/Glob SerPl: 1.6 (ref 0.7–1.7)
Alpha 1: 0.2 g/dL (ref 0.0–0.4)
Alpha2 Glob SerPl Elph-Mcnc: 0.6 g/dL (ref 0.4–1.0)
B-Globulin SerPl Elph-Mcnc: 1 g/dL (ref 0.7–1.3)
Gamma Glob SerPl Elph-Mcnc: 0.9 g/dL (ref 0.4–1.8)
Globulin, Total: 2.6 g/dL (ref 2.2–3.9)
IgA: 355 mg/dL (ref 61–437)
IgG (Immunoglobin G), Serum: 753 mg/dL (ref 603–1613)
IgM (Immunoglobulin M), Srm: 48 mg/dL (ref 20–172)
M Protein SerPl Elph-Mcnc: 0.4 g/dL — ABNORMAL HIGH
Total Protein ELP: 6.5 g/dL (ref 6.0–8.5)

## 2019-07-24 ENCOUNTER — Inpatient Hospital Stay: Payer: Medicare Other | Admitting: Internal Medicine

## 2019-07-24 ENCOUNTER — Other Ambulatory Visit: Payer: Self-pay

## 2019-07-24 ENCOUNTER — Encounter: Payer: Self-pay | Admitting: Internal Medicine

## 2019-07-24 DIAGNOSIS — D472 Monoclonal gammopathy: Secondary | ICD-10-CM

## 2019-07-24 NOTE — Progress Notes (Unsigned)
[facility] OFFICE PROGRESS NOTE  Patient Care Team: Hubbard Hartshorn, FNP as PCP - General (Family Medicine) Cammie Sickle, MD as Consulting Physician (Internal Medicine) Lollie Sails, MD (Inactive) as Consulting Physician (Gastroenterology)  Cancer Staging No matching staging information was found for the patient.   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 UTI/kidney stone [Dr.Stoiff]- s/p Anti-biotics. S/p removal   DIAGNOSIS: Smoldering myeloma  GOALS: Cure  CURRENT/MOST RECENT THERAPY: Surveillance    Smoldering multiple myeloma (SMM) (HCC)   INTERVAL HISTORY:  [name] 69 y.o.  male pleasant patient above history of smoldering myeloma currently on surveillance is here for follow-up...  Patient continues to play golf.  He denies any unusual joint pains or bone pain.  Has chronic arthritis for which he is currently taking Tylenol 2 pills a day in the morning.  He stopped taking Advil's.   Review of Systems  Constitutional: Negative for chills, diaphoresis, fever, malaise/fatigue and weight loss.  HENT: Negative for nosebleeds and sore throat.   Eyes: Negative for double vision.  Respiratory: Negative for cough, hemoptysis, sputum production, shortness of breath and wheezing.   Cardiovascular: Negative for chest pain, palpitations, orthopnea and leg swelling.  Gastrointestinal: Negative for abdominal pain, blood in stool, constipation, diarrhea, heartburn, melena, nausea and vomiting.  Genitourinary: Negative for dysuria, frequency and urgency.  Musculoskeletal: Positive for back pain and joint pain.  Skin: Negative.  Negative for itching and rash.  Neurological: Negative for dizziness, tingling, focal weakness, weakness and headaches.  Endo/Heme/Allergies: Does not  bruise/bleed easily.  Psychiatric/Behavioral: Negative for depression. The patient is not nervous/anxious and does not have insomnia.       PAST MEDICAL HISTORY :  Past Medical History:  Diagnosis Date  . Abnormal laboratory test    Sees Dr. Ma Hillock  . Acid reflux   . Allergy bee venom   mainly as a child  . Anxiety 12/08/1969  . Arthritis   . Asthma 12/08/1953   mainly as a child  . Chronic back pain   . Colon cancer ([facility]) 11/1999   stage 3 s/p colon resection  . Depression   . H/O Clostridium difficile infection   . History of chemotherapy    5-FU pump/leukovorin  . History of kidney stones   . Hypothyroidism    NO MEDS NOW  . Insomnia   . Multiple myeloma ([place]) 12/2011   smoldering vs mgus  . Nephrolithiasis 08/28/2017  . Osteoporosis   . Seasonal allergies     PAST SURGICAL HISTORY :   Past Surgical History:  Procedure Laterality Date  . BACK SURGERY    . COLON SURGERY  2001   resection, 2nd surgery for scar tissue removal  . COLONOSCOPY    . COLONOSCOPY WITH PROPOFOL N/A 03/26/2016   Procedure: COLONOSCOPY WITH PROPOFOL;  Surgeon: Lollie Sails, MD;  Location: [facility] ENDOSCOPY;  Service: Endoscopy;  Laterality: N/A;  . CYSTOSCOPY WITH URETEROSCOPY AND STENT PLACEMENT Left 02/21/2018   Procedure: URETEROSCOPY, LASER LITHOTRIPSY, STONE REMOVAL AND STENT PLACEMENT;  Surgeon: Abbie Sons, MD;  Location: [facility] ORS;  Service: Urology;  Laterality: Left;  . CYSTOSCOPY/URETEROSCOPY/HOLMIUM LASER/STENT PLACEMENT Right 09/03/2017   Procedure: CYSTOSCOPY/URETEROSCOPY/HOLMIUM LASER/STENT PLACEMENT;  Surgeon: Abbie Sons, MD;  Location: [facility] ORS;  Service: Urology;  Laterality: Right;  . ESOPHAGOGASTRODUODENOSCOPY  10/2010  . EYE SURGERY  1994 and 2010   RKA in 68 and [place] in 2010  . FLEXIBLE SIGMOIDOSCOPY N/A 07/06/2018   Procedure: FLEXIBLE SIGMOIDOSCOPY;  Surgeon: Toledo, Benay Pike, MD;  Location: [facility] ENDOSCOPY;  Service: Gastroenterology;  Laterality: N/A;  .  [place]   lower left  . HOLMIUM LASER APPLICATION Left 08/06/6376   Procedure: HOLMIUM LASER APPLICATION;  Surgeon: Abbie Sons, MD;  Location: [facility] ORS;  Service: Urology;  Laterality: Left;  . LUMBAR FUSION     L4-L5, L5-S1  . SACROILIAC JOINT FUSION    . SHOULDER SURGERY Left   . SPINAL CORD STIMULATOR IMPLANT      FAMILY HISTORY :   Family History  Problem Relation Age of Onset  . Lymphoma Mother   . Arthritis Mother        deceased  . Cancer Mother   . Lung cancer Father   . Alcohol abuse Father        deceased  . Cancer Father   . Heart disease Other        grandparents  . Hyperlipidemia Sister   . Diabetes Brother   . Diabetes Son   . Alcohol abuse Brother   . Arthritis Brother   . Depression Brother   . Diabetes Brother   . Alcohol abuse Sister   . Arthritis Sister   . Depression Sister   . Alcohol abuse Son   . Early death Son        truck accident 84 at age 55    SOCIAL HISTORY:   Social History   Tobacco Use  . Smoking status: Former Smoker    Packs/day: 1.00    Years: 10.00    Pack years: 10.00    Types: Cigarettes    Quit date: 03/06/1970    Years since quitting: 49.4  . Smokeless tobacco: Never Used  . Tobacco comment: started at age 53 and quit at age 41  Vaping Use  . Vaping Use: Never used  Substance Use Topics  . Alcohol use: No  . Drug use: No    ALLERGIES:  is allergic to bee venom.  MEDICATIONS:  Current Outpatient Medications  Medication Sig Dispense Refill  . acetaminophen (TYLENOL) 650 MG CR tablet Take 1,300 mg by mouth every 8 (eight) hours as needed for pain.    . cholecalciferol (VITAMIN D3) 25 MCG (1000 UT) tablet Take 2,000 Units by mouth daily.     . Multiple Vitamins-Minerals (CENTRUM SILVER ULTRA MENS) TABS Take 1 tablet by mouth daily.    . traZODone (DESYREL) 100 MG tablet TAKE 1 TABLET BY MOUTH AT BEDTIME AS NEEDED FOR SLEEP 90 tablet 1   No current facility-administered medications for this  visit.    PHYSICAL EXAMINATION: ECOG PERFORMANCE STATUS: 0 - Asymptomatic  There were no vitals taken for this visit.  There were no vitals filed for this visit.  Physical Exam HENT:     Head: Normocephalic and atraumatic.     Mouth/Throat:     Pharynx: No oropharyngeal exudate.  Eyes:     Pupils: Pupils are equal, round, and reactive to light.  Cardiovascular:     Rate and Rhythm: Normal rate and regular rhythm.  Pulmonary:     Effort: No respiratory distress.     Breath sounds: No wheezing.  Abdominal:     General: Bowel sounds are normal. There is no distension.     Palpations: Abdomen is soft. There is no  mass.     Tenderness: There is no abdominal tenderness. There is no guarding or rebound.  Musculoskeletal:        General: No tenderness. Normal range of motion.     Cervical back: Normal range of motion and neck supple.  Skin:    General: Skin is warm.  Neurological:     Mental Status: He is alert and oriented to person, place, and time.  Psychiatric:        Mood and Affect: Affect normal.    LABORATORY DATA:  I have reviewed the data as listed    Component Value Date/Time   NA 137 07/17/2019 0955   K 4.2 07/17/2019 0955   CL 101 07/17/2019 0955   CO2 28 07/17/2019 0955   GLUCOSE 104 (H) 07/17/2019 0955   BUN 18 07/17/2019 0955   CREATININE 1.21 07/17/2019 0955   CREATININE 1.27 (H) 11/25/2017 1401   CALCIUM 9.2 07/17/2019 0955   CALCIUM 9.6 12/28/2013 1428   PROT 7.0 07/17/2019 0955   PROT 6.7 06/27/2012 1136   ALBUMIN 4.2 07/17/2019 0955   ALBUMIN 3.5 06/27/2012 1136   AST 22 07/17/2019 0955   AST 17 06/27/2012 1136   ALT 21 07/17/2019 0955   ALT 22 06/27/2012 1136   ALKPHOS 69 07/17/2019 0955   ALKPHOS 91 06/27/2012 1136   BILITOT 0.8 07/17/2019 0955   BILITOT 0.3 06/27/2012 1136   GFRNONAA >60 07/17/2019 0955   GFRNONAA 58 (L) 11/25/2017 1401   GFRAA >60 07/17/2019 0955   GFRAA 67 11/25/2017 1401    No results found for: SPEP, UPEP  Lab  Results  Component Value Date   WBC 7.6 07/17/2019   NEUTROABS 4.0 07/17/2019   HGB 14.6 07/17/2019   HCT 42.2 07/17/2019   MCV 94.6 07/17/2019   PLT 332 07/17/2019      Chemistry      Component Value Date/Time   NA 137 07/17/2019 0955   K 4.2 07/17/2019 0955   CL 101 07/17/2019 0955   CO2 28 07/17/2019 0955   BUN 18 07/17/2019 0955   CREATININE 1.21 07/17/2019 0955   CREATININE 1.27 (H) 11/25/2017 1401      Component Value Date/Time   CALCIUM 9.2 07/17/2019 0955   CALCIUM 9.6 12/28/2013 1428   ALKPHOS 69 07/17/2019 0955   ALKPHOS 91 06/27/2012 1136   AST 22 07/17/2019 0955   AST 17 06/27/2012 1136   ALT 21 07/17/2019 0955   ALT 22 06/27/2012 1136   BILITOT 0.8 07/17/2019 0955   BILITOT 0.3 06/27/2012 1136      RADIOGRAPHIC STUDIES: I have personally reviewed the radiological images as listed and agreed with the findings in the report. No results found.   ASSESSMENT & PLAN:  No problem-specific Assessment & Plan notes found for this encounter.   No orders of the defined types were placed in this encounter.  All questions were answered. The patient knows to call the clinic with any problems, questions or concerns.      Cammie Sickle, MD 07/24/2019 10:20 AM

## 2019-07-24 NOTE — Assessment & Plan Note (Addendum)
#  SMOLDERING MULTIPLE MYELOMA [2013 s/p BMBx].  No clinical evidence of progression.  Clinically STABLE.   # M protein 0.4 g/dL; kappa lambda light chain ratio ~8;STABLE.   # Arthritis- recommend HOLD off NSAIDS; ok to take tylenol prn as needed. STABLE.   # DISPOSITION:  # follow up in 6 months/labs-MD-cbc/cmp/MM panel/K-L light chains- 1 week prior-Dr.B  

## 2019-11-02 ENCOUNTER — Other Ambulatory Visit: Payer: Self-pay

## 2019-11-02 ENCOUNTER — Ambulatory Visit (INDEPENDENT_AMBULATORY_CARE_PROVIDER_SITE_OTHER): Payer: Medicare Other

## 2019-11-02 DIAGNOSIS — Z23 Encounter for immunization: Secondary | ICD-10-CM

## 2019-11-06 DIAGNOSIS — Z23 Encounter for immunization: Secondary | ICD-10-CM | POA: Diagnosis not present

## 2020-01-15 ENCOUNTER — Inpatient Hospital Stay: Payer: Medicare Other | Attending: Internal Medicine

## 2020-01-15 DIAGNOSIS — Z85038 Personal history of other malignant neoplasm of large intestine: Secondary | ICD-10-CM | POA: Insufficient documentation

## 2020-01-15 DIAGNOSIS — C9000 Multiple myeloma not having achieved remission: Secondary | ICD-10-CM | POA: Insufficient documentation

## 2020-01-15 DIAGNOSIS — D472 Monoclonal gammopathy: Secondary | ICD-10-CM

## 2020-01-15 DIAGNOSIS — M1990 Unspecified osteoarthritis, unspecified site: Secondary | ICD-10-CM | POA: Insufficient documentation

## 2020-01-15 DIAGNOSIS — Z87891 Personal history of nicotine dependence: Secondary | ICD-10-CM | POA: Diagnosis not present

## 2020-01-15 LAB — COMPREHENSIVE METABOLIC PANEL
ALT: 19 U/L (ref 0–44)
AST: 24 U/L (ref 15–41)
Albumin: 4.2 g/dL (ref 3.5–5.0)
Alkaline Phosphatase: 63 U/L (ref 38–126)
Anion gap: 9 (ref 5–15)
BUN: 18 mg/dL (ref 8–23)
CO2: 28 mmol/L (ref 22–32)
Calcium: 9.4 mg/dL (ref 8.9–10.3)
Chloride: 101 mmol/L (ref 98–111)
Creatinine, Ser: 1.2 mg/dL (ref 0.61–1.24)
GFR, Estimated: >60 mL/min (ref >60)
Glucose, Bld: 91 mg/dL (ref 70–99)
Potassium: 4.1 mmol/L (ref 3.5–5.1)
Sodium: 138 mmol/L (ref 135–145)
Total Bilirubin: 0.6 mg/dL (ref 0.3–1.2)
Total Protein: 7 g/dL (ref 6.5–8.1)

## 2020-01-15 LAB — CBC WITH DIFFERENTIAL/PLATELET
Abs Immature Granulocytes: 0.02 10*3/uL (ref 0.00–0.07)
Basophils Absolute: 0 10*3/uL (ref 0.0–0.1)
Basophils Relative: 1 %
Eosinophils Absolute: 0.2 10*3/uL (ref 0.0–0.5)
Eosinophils Relative: 3 %
HCT: 40 % (ref 39.0–52.0)
Hemoglobin: 13.9 g/dL (ref 13.0–17.0)
Immature Granulocytes: 0 %
Lymphocytes Relative: 28 %
Lymphs Abs: 1.8 10*3/uL (ref 0.7–4.0)
MCH: 32.9 pg (ref 26.0–34.0)
MCHC: 34.8 g/dL (ref 30.0–36.0)
MCV: 94.8 fL (ref 80.0–100.0)
Monocytes Absolute: 0.6 10*3/uL (ref 0.1–1.0)
Monocytes Relative: 9 %
Neutro Abs: 3.7 10*3/uL (ref 1.7–7.7)
Neutrophils Relative %: 59 %
Platelets: 339 10*3/uL (ref 150–400)
RBC: 4.22 MIL/uL (ref 4.22–5.81)
RDW: 11.6 % (ref 11.5–15.5)
WBC: 6.3 10*3/uL (ref 4.0–10.5)
nRBC: 0 % (ref 0.0–0.2)

## 2020-01-18 LAB — MULTIPLE MYELOMA PANEL, SERUM
Albumin SerPl Elph-Mcnc: 3.8 g/dL (ref 2.9–4.4)
Albumin/Glob SerPl: 1.6 (ref 0.7–1.7)
Alpha 1: 0.2 g/dL (ref 0.0–0.4)
Alpha2 Glob SerPl Elph-Mcnc: 0.6 g/dL (ref 0.4–1.0)
B-Globulin SerPl Elph-Mcnc: 0.8 g/dL (ref 0.7–1.3)
Gamma Glob SerPl Elph-Mcnc: 0.8 g/dL (ref 0.4–1.8)
Globulin, Total: 2.4 g/dL (ref 2.2–3.9)
IgA: 354 mg/dL (ref 61–437)
IgG (Immunoglobin G), Serum: 693 mg/dL (ref 603–1613)
IgM (Immunoglobulin M), Srm: 45 mg/dL (ref 15–143)
M Protein SerPl Elph-Mcnc: 0.4 g/dL — ABNORMAL HIGH
Total Protein ELP: 6.2 g/dL (ref 6.0–8.5)

## 2020-01-18 LAB — KAPPA/LAMBDA LIGHT CHAINS
Kappa free light chain: 128.5 mg/L — ABNORMAL HIGH (ref 3.3–19.4)
Kappa, lambda light chain ratio: 8.18 — ABNORMAL HIGH (ref 0.26–1.65)
Lambda free light chains: 15.7 mg/L (ref 5.7–26.3)

## 2020-01-21 ENCOUNTER — Other Ambulatory Visit: Payer: Self-pay

## 2020-01-21 DIAGNOSIS — D472 Monoclonal gammopathy: Secondary | ICD-10-CM

## 2020-01-22 ENCOUNTER — Other Ambulatory Visit: Payer: Self-pay

## 2020-01-22 ENCOUNTER — Inpatient Hospital Stay (HOSPITAL_BASED_OUTPATIENT_CLINIC_OR_DEPARTMENT_OTHER): Payer: Medicare Other | Admitting: Internal Medicine

## 2020-01-22 DIAGNOSIS — C9000 Multiple myeloma not having achieved remission: Secondary | ICD-10-CM

## 2020-01-22 DIAGNOSIS — Z87891 Personal history of nicotine dependence: Secondary | ICD-10-CM | POA: Diagnosis not present

## 2020-01-22 DIAGNOSIS — M1990 Unspecified osteoarthritis, unspecified site: Secondary | ICD-10-CM | POA: Diagnosis not present

## 2020-01-22 DIAGNOSIS — Z85038 Personal history of other malignant neoplasm of large intestine: Secondary | ICD-10-CM | POA: Diagnosis not present

## 2020-01-22 DIAGNOSIS — D472 Monoclonal gammopathy: Secondary | ICD-10-CM

## 2020-01-22 NOTE — Assessment & Plan Note (Signed)
#  SMOLDERING MULTIPLE MYELOMA [2013 s/p BMBx].  No clinical evidence of progression.  Clinically STABLE.   # M protein 0.4 g/dL; kappa lambda light chain ratio ~8;STABLE.   # Arthritis- recommend HOLD off NSAIDS; ok to take tylenol prn as needed. STABLE.   # DISPOSITION:  # follow up in 6 months/labs-MD-cbc/cmp/MM panel/K-L light chains- 1 week prior-Dr.B

## 2020-01-22 NOTE — Progress Notes (Signed)
[place] OFFICE PROGRESS NOTE  Patient Care Team: Hubbard Hartshorn, FNP as PCP - General (Family Medicine) Cammie Sickle, MD as Consulting Physician (Internal Medicine) Lollie Sails, MD (Inactive) as Consulting Physician (Gastroenterology)  Cancer Staging No matching staging information was found for the patient.   Oncology History Overview Note  # NOV 2013- SMOLDERING MULTIPLE MYELOMA [IgA Kappa; BMBx- 10-12% plasma cell;Dr.Trillo Frisco]   # 2001- COLON CA STAGE III [s/p chemo; [place]]  # colonoscopy- 2018/ Dr.Anna  # ? UTI/kidney stone [Dr.Stoiff]- s/p Anti-biotics. S/p removal   DIAGNOSIS: Smoldering myeloma  GOALS: Cure  CURRENT/MOST RECENT THERAPY: Surveillance    Smoldering multiple myeloma (SMM) (HCC)   INTERVAL HISTORY:  [name] 69 y.o.  male pleasant patient above history of smoldering myeloma currently on surveillance is here for follow-up.  Patient continues to play golf.  Denies any unusual joint pains or bone pain.  Continues to take Tylenol as needed for pain.   Review of Systems  Constitutional: Negative for chills, diaphoresis, fever, malaise/fatigue and weight loss.  HENT: Negative for nosebleeds and sore throat.   Eyes: Negative for double vision.  Respiratory: Negative for cough, hemoptysis, sputum production, shortness of breath and wheezing.   Cardiovascular: Negative for chest pain, palpitations, orthopnea and leg swelling.  Gastrointestinal: Negative for abdominal pain, blood in stool, constipation, diarrhea, heartburn, melena, nausea and vomiting.  Genitourinary: Negative for dysuria, frequency and urgency.  Musculoskeletal: Positive for back pain and joint pain.  Skin: Negative.  Negative for itching and rash.  Neurological: Negative for dizziness, tingling, focal weakness, weakness and headaches.  Endo/Heme/Allergies: Does not bruise/bleed easily.  Psychiatric/Behavioral: Negative for depression. The  patient is not nervous/anxious and does not have insomnia.       PAST MEDICAL HISTORY :  Past Medical History:  Diagnosis Date  . Abnormal laboratory test    Sees Dr. Ma Hillock  . Acid reflux   . Allergy bee venom   mainly as a child  . Anxiety 12/08/1969  . Arthritis   . Asthma 12/08/1953   mainly as a child  . Chronic back pain   . Colon cancer ([place]) 11/1999   stage 3 s/p colon resection  . Depression   . H/O Clostridium difficile infection   . History of chemotherapy    5-FU pump/leukovorin  . History of kidney stones   . Hypothyroidism    NO MEDS NOW  . Insomnia   . Multiple myeloma ([place]) 12/2011   smoldering vs mgus  . Nephrolithiasis 08/28/2017  . Osteoporosis   . Seasonal allergies     PAST SURGICAL HISTORY :   Past Surgical History:  Procedure Laterality Date  . BACK SURGERY    . COLON SURGERY  2001   resection, 2nd surgery for scar tissue removal  . COLONOSCOPY    . COLONOSCOPY WITH PROPOFOL N/A 03/26/2016   Procedure: COLONOSCOPY WITH PROPOFOL;  Surgeon: Lollie Sails, MD;  Location: [facility] ENDOSCOPY;  Service: Endoscopy;  Laterality: N/A;  . CYSTOSCOPY WITH URETEROSCOPY AND STENT PLACEMENT Left 02/21/2018   Procedure: URETEROSCOPY, LASER LITHOTRIPSY, STONE REMOVAL AND STENT PLACEMENT;  Surgeon: Abbie Sons, MD;  Location: [facility] ORS;  Service: Urology;  Laterality: Left;  . CYSTOSCOPY/URETEROSCOPY/HOLMIUM LASER/STENT PLACEMENT Right 09/03/2017   Procedure: CYSTOSCOPY/URETEROSCOPY/HOLMIUM LASER/STENT PLACEMENT;  Surgeon: Abbie Sons, MD;  Location: [facility] ORS;  Service: Urology;  Laterality: Right;  . ESOPHAGOGASTRODUODENOSCOPY  10/2010  . [place] and 2010   RKA in 31  and [place] in 2010  . FLEXIBLE SIGMOIDOSCOPY N/A 07/06/2018   Procedure: FLEXIBLE SIGMOIDOSCOPY;  Surgeon: Toledo, Benay Pike, MD;  Location: [facility] ENDOSCOPY;  Service: Gastroenterology;  Laterality: N/A;  . [place]   lower left  . HOLMIUM LASER APPLICATION Left 1/96/2229    Procedure: HOLMIUM LASER APPLICATION;  Surgeon: Abbie Sons, MD;  Location: [facility] ORS;  Service: Urology;  Laterality: Left;  . LUMBAR FUSION     L4-L5, L5-S1  . SACROILIAC JOINT FUSION    . SHOULDER SURGERY Left   . SPINAL CORD STIMULATOR IMPLANT      FAMILY HISTORY :   Family History  Problem Relation Age of Onset  . Lymphoma Mother   . Arthritis Mother        deceased  . Cancer Mother   . Lung cancer Father   . Alcohol abuse Father        deceased  . Cancer Father   . Heart disease Other        grandparents  . Hyperlipidemia Sister   . Diabetes Brother   . Diabetes Son   . Alcohol abuse Brother   . Arthritis Brother   . Depression Brother   . Diabetes Brother   . Alcohol abuse Sister   . Arthritis Sister   . Depression Sister   . Alcohol abuse Son   . Early death Son        truck accident 35 at age 23    SOCIAL HISTORY:   Social History   Tobacco Use  . Smoking status: Former Smoker    Packs/day: 1.00    Years: 10.00    Pack years: 10.00    Types: Cigarettes    Quit date: 03/06/1970    Years since quitting: 49.9  . Smokeless tobacco: Never Used  . Tobacco comment: started at age 67 and quit at age 10  Vaping Use  . Vaping Use: Never used  Substance Use Topics  . Alcohol use: No  . Drug use: No    ALLERGIES:  is allergic to bee venom.  MEDICATIONS:  Current Outpatient Medications  Medication Sig Dispense Refill  . acetaminophen (TYLENOL) 650 MG CR tablet Take 1,300 mg by mouth every 8 (eight) hours as needed for pain.    . cholecalciferol (VITAMIN D3) 25 MCG (1000 UT) tablet Take 2,000 Units by mouth daily.     . Multiple Vitamins-Minerals (CENTRUM SILVER ULTRA MENS) TABS Take 1 tablet by mouth daily.    . traZODone (DESYREL) 100 MG tablet TAKE 1 TABLET BY MOUTH AT BEDTIME AS NEEDED FOR SLEEP 90 tablet 1   No current facility-administered medications for this visit.    PHYSICAL EXAMINATION: ECOG PERFORMANCE STATUS: 0 - Asymptomatic  BP  (!) 148/89 (BP Location: Right Arm, Patient Position: Sitting)   Pulse 86   Temp 97.7 F (36.5 C) (Tympanic)   Wt 134 lb (60.8 kg)   SpO2 99%   BMI 20.99 kg/m   Filed Weights   01/22/20 1027  Weight: 134 lb (60.8 kg)    Physical Exam HENT:     Head: Normocephalic and atraumatic.     Mouth/Throat:     Pharynx: No oropharyngeal exudate.  Eyes:     Pupils: Pupils are equal, round, and reactive to light.  Cardiovascular:     Rate and Rhythm: Normal rate and regular rhythm.  Pulmonary:     Effort: No respiratory distress.     Breath sounds: No wheezing.  Abdominal:  General: Bowel sounds are normal. There is no distension.     Palpations: Abdomen is soft. There is no mass.     Tenderness: There is no abdominal tenderness. There is no guarding or rebound.  Musculoskeletal:        General: No tenderness. Normal range of motion.     Cervical back: Normal range of motion and neck supple.  Skin:    General: Skin is warm.  Neurological:     Mental Status: He is alert and oriented to person, place, and time.  Psychiatric:        Mood and Affect: Affect normal.    LABORATORY DATA:  I have reviewed the data as listed    Component Value Date/Time   NA 138 01/15/2020 1050   K 4.1 01/15/2020 1050   CL 101 01/15/2020 1050   CO2 28 01/15/2020 1050   GLUCOSE 91 01/15/2020 1050   BUN 18 01/15/2020 1050   CREATININE 1.20 01/15/2020 1050   CREATININE 1.27 (H) 11/25/2017 1401   CALCIUM 9.4 01/15/2020 1050   CALCIUM 9.6 12/28/2013 1428   PROT 7.0 01/15/2020 1050   PROT 6.7 06/27/2012 1136   ALBUMIN 4.2 01/15/2020 1050   ALBUMIN 3.5 06/27/2012 1136   AST 24 01/15/2020 1050   AST 17 06/27/2012 1136   ALT 19 01/15/2020 1050   ALT 22 06/27/2012 1136   ALKPHOS 63 01/15/2020 1050   ALKPHOS 91 06/27/2012 1136   BILITOT 0.6 01/15/2020 1050   BILITOT 0.3 06/27/2012 1136   GFRNONAA >60 01/15/2020 1050   GFRNONAA 58 (L) 11/25/2017 1401   GFRAA >60 07/17/2019 0955   GFRAA 67  11/25/2017 1401    No results found for: SPEP, UPEP  Lab Results  Component Value Date   WBC 6.3 01/15/2020   NEUTROABS 3.7 01/15/2020   HGB 13.9 01/15/2020   HCT 40.0 01/15/2020   MCV 94.8 01/15/2020   PLT 339 01/15/2020      Chemistry      Component Value Date/Time   NA 138 01/15/2020 1050   K 4.1 01/15/2020 1050   CL 101 01/15/2020 1050   CO2 28 01/15/2020 1050   BUN 18 01/15/2020 1050   CREATININE 1.20 01/15/2020 1050   CREATININE 1.27 (H) 11/25/2017 1401      Component Value Date/Time   CALCIUM 9.4 01/15/2020 1050   CALCIUM 9.6 12/28/2013 1428   ALKPHOS 63 01/15/2020 1050   ALKPHOS 91 06/27/2012 1136   AST 24 01/15/2020 1050   AST 17 06/27/2012 1136   ALT 19 01/15/2020 1050   ALT 22 06/27/2012 1136   BILITOT 0.6 01/15/2020 1050   BILITOT 0.3 06/27/2012 1136      RADIOGRAPHIC STUDIES: I have personally reviewed the radiological images as listed and agreed with the findings in the report. No results found.   ASSESSMENT & PLAN:  Smoldering multiple myeloma (SMM) ([place]) # SMOLDERING MULTIPLE MYELOMA [2013 s/p BMBx].  No clinical evidence of progression.  Clinically STABLE.   # M protein 0.4 g/dL; kappa lambda light chain ratio ~8;STABLE.   # Arthritis- recommend HOLD off NSAIDS; ok to take tylenol prn as needed. STABLE.   # DISPOSITION:  # follow up in 6 months/labs-MD-cbc/cmp/MM panel/K-L light chains- 1 week prior-Dr.B   No orders of the defined types were placed in this encounter.  All questions were answered. The patient knows to call the clinic with any problems, questions or concerns.      Cammie Sickle, MD 01/22/2020 2:42 PM

## 2020-02-16 ENCOUNTER — Ambulatory Visit: Payer: Medicare Other

## 2020-06-13 ENCOUNTER — Telehealth: Payer: Self-pay | Admitting: *Deleted

## 2020-06-13 NOTE — Telephone Encounter (Signed)
Pt reports tested positive covid, home test, yesterday. States dry cough, no fever, no other symptoms "Cough better with NyQuil today". Guidelines for self isolation reviewed. Reviewed symptoms that warrant an ED visit. Advised to CB if any questions arise. Pt last seen 02/27/19 by E. Boyce.

## 2020-07-15 ENCOUNTER — Inpatient Hospital Stay: Payer: Medicare Other

## 2020-07-18 ENCOUNTER — Inpatient Hospital Stay: Payer: Medicare Other | Attending: Internal Medicine

## 2020-07-18 DIAGNOSIS — M1990 Unspecified osteoarthritis, unspecified site: Secondary | ICD-10-CM | POA: Diagnosis not present

## 2020-07-18 DIAGNOSIS — D472 Monoclonal gammopathy: Secondary | ICD-10-CM | POA: Diagnosis present

## 2020-07-18 LAB — CBC WITH DIFFERENTIAL/PLATELET
Abs Immature Granulocytes: 0.01 10*3/uL (ref 0.00–0.07)
Basophils Absolute: 0 10*3/uL (ref 0.0–0.1)
Basophils Relative: 0 %
Eosinophils Absolute: 0.3 10*3/uL (ref 0.0–0.5)
Eosinophils Relative: 4 %
HCT: 40.6 % (ref 39.0–52.0)
Hemoglobin: 14.3 g/dL (ref 13.0–17.0)
Immature Granulocytes: 0 %
Lymphocytes Relative: 34 %
Lymphs Abs: 2.5 10*3/uL (ref 0.7–4.0)
MCH: 32.7 pg (ref 26.0–34.0)
MCHC: 35.2 g/dL (ref 30.0–36.0)
MCV: 92.9 fL (ref 80.0–100.0)
Monocytes Absolute: 0.6 10*3/uL (ref 0.1–1.0)
Monocytes Relative: 8 %
Neutro Abs: 3.9 10*3/uL (ref 1.7–7.7)
Neutrophils Relative %: 54 %
Platelets: 343 10*3/uL (ref 150–400)
RBC: 4.37 MIL/uL (ref 4.22–5.81)
RDW: 11.8 % (ref 11.5–15.5)
WBC: 7.2 10*3/uL (ref 4.0–10.5)
nRBC: 0 % (ref 0.0–0.2)

## 2020-07-18 LAB — COMPREHENSIVE METABOLIC PANEL
ALT: 19 U/L (ref 0–44)
AST: 25 U/L (ref 15–41)
Albumin: 4.3 g/dL (ref 3.5–5.0)
Alkaline Phosphatase: 63 U/L (ref 38–126)
Anion gap: 9 (ref 5–15)
BUN: 20 mg/dL (ref 8–23)
CO2: 24 mmol/L (ref 22–32)
Calcium: 9.2 mg/dL (ref 8.9–10.3)
Chloride: 102 mmol/L (ref 98–111)
Creatinine, Ser: 1.23 mg/dL (ref 0.61–1.24)
GFR, Estimated: >60 mL/min (ref >60)
Glucose, Bld: 144 mg/dL — ABNORMAL HIGH (ref 70–99)
Potassium: 3.9 mmol/L (ref 3.5–5.1)
Sodium: 135 mmol/L (ref 135–145)
Total Bilirubin: 0.3 mg/dL (ref 0.3–1.2)
Total Protein: 7.1 g/dL (ref 6.5–8.1)

## 2020-07-19 LAB — KAPPA/LAMBDA LIGHT CHAINS
Kappa free light chain: 99.2 mg/L — ABNORMAL HIGH (ref 3.3–19.4)
Kappa, lambda light chain ratio: 6.05 — ABNORMAL HIGH (ref 0.26–1.65)
Lambda free light chains: 16.4 mg/L (ref 5.7–26.3)

## 2020-07-20 LAB — MULTIPLE MYELOMA PANEL, SERUM
Albumin SerPl Elph-Mcnc: 4 g/dL (ref 2.9–4.4)
Albumin/Glob SerPl: 1.6 (ref 0.7–1.7)
Alpha 1: 0.2 g/dL (ref 0.0–0.4)
Alpha2 Glob SerPl Elph-Mcnc: 0.6 g/dL (ref 0.4–1.0)
B-Globulin SerPl Elph-Mcnc: 0.9 g/dL (ref 0.7–1.3)
Gamma Glob SerPl Elph-Mcnc: 0.8 g/dL (ref 0.4–1.8)
Globulin, Total: 2.6 g/dL (ref 2.2–3.9)
IgA: 370 mg/dL (ref 61–437)
IgG (Immunoglobin G), Serum: 734 mg/dL (ref 603–1613)
IgM (Immunoglobulin M), Srm: 54 mg/dL (ref 15–143)
M Protein SerPl Elph-Mcnc: 0.3 g/dL — ABNORMAL HIGH
Total Protein ELP: 6.6 g/dL (ref 6.0–8.5)

## 2020-07-22 ENCOUNTER — Inpatient Hospital Stay: Payer: Medicare Other | Admitting: Internal Medicine

## 2020-07-25 ENCOUNTER — Other Ambulatory Visit: Payer: Self-pay

## 2020-07-25 ENCOUNTER — Encounter: Payer: Self-pay | Admitting: Internal Medicine

## 2020-07-25 ENCOUNTER — Inpatient Hospital Stay (HOSPITAL_BASED_OUTPATIENT_CLINIC_OR_DEPARTMENT_OTHER): Payer: Medicare Other | Admitting: Internal Medicine

## 2020-07-25 DIAGNOSIS — D472 Monoclonal gammopathy: Secondary | ICD-10-CM

## 2020-07-25 DIAGNOSIS — C9000 Multiple myeloma not having achieved remission: Secondary | ICD-10-CM

## 2020-07-25 NOTE — Progress Notes (Signed)
[place] OFFICE PROGRESS NOTE  Patient Care Team: Pcp, No as PCP - General Cammie Sickle, MD as Consulting Physician (Internal Medicine) Lollie Sails, MD (Inactive) as Consulting Physician (Gastroenterology)  Cancer Staging No matching staging information was found for the patient.   Oncology History Overview Note  # NOV 2013- SMOLDERING MULTIPLE MYELOMA [IgA Kappa; BMBx- 10-12% plasma cell;Dr.Trillo Frisco]   # 2001- COLON CA STAGE III [s/p chemo; [place]]  # colonoscopy- 2018/ Dr.Anna  # ? UTI/kidney stone [Dr.Stoiff]- s/p Anti-biotics. S/p removal   DIAGNOSIS: Smoldering myeloma  GOALS: Cure  CURRENT/MOST RECENT THERAPY: Surveillance    Smoldering multiple myeloma (SMM) (HCC)   INTERVAL HISTORY:  [name] 70 y.o.  male pleasant patient above history of smoldering myeloma currently on surveillance is here for follow-up.  In the interim patient was diagnosed with COVID; patient s/p vaccination.  Denied any major symptoms or hospitalizations.  Patient continues to play golf.  Chronic mild back pain not any worse.   Review of Systems  Constitutional:  Negative for chills, diaphoresis, fever, malaise/fatigue and weight loss.  HENT:  Negative for nosebleeds and sore throat.   Eyes:  Negative for double vision.  Respiratory:  Negative for cough, hemoptysis, sputum production, shortness of breath and wheezing.   Cardiovascular:  Negative for chest pain, palpitations, orthopnea and leg swelling.  Gastrointestinal:  Negative for abdominal pain, blood in stool, constipation, diarrhea, heartburn, melena, nausea and vomiting.  Genitourinary:  Negative for dysuria, frequency and urgency.  Musculoskeletal:  Positive for back pain and joint pain.  Skin: Negative.  Negative for itching and rash.  Neurological:  Negative for dizziness, tingling, focal weakness, weakness and headaches.  Endo/Heme/Allergies:  Does not bruise/bleed easily.   Psychiatric/Behavioral:  Negative for depression. The patient is not nervous/anxious and does not have insomnia.      PAST MEDICAL HISTORY :  Past Medical History:  Diagnosis Date   Abnormal laboratory test    Sees Dr. Ma Hillock   Acid reflux    Allergy bee venom   mainly as a child   Anxiety 12/08/1969   Arthritis    Asthma 12/08/1953   mainly as a child   Chronic back pain    Colon cancer ([place]) 11/1999   stage 3 s/p colon resection   Depression    H/O Clostridium difficile infection    History of chemotherapy    5-FU pump/leukovorin   History of kidney stones    Hypothyroidism    NO MEDS NOW   Insomnia    Multiple myeloma ([place]) 12/2011   smoldering vs mgus   Nephrolithiasis 08/28/2017   Osteoporosis    Seasonal allergies     PAST SURGICAL HISTORY :   Past Surgical History:  Procedure Laterality Date   BACK SURGERY     COLON SURGERY  2001   resection, 2nd surgery for scar tissue removal   COLONOSCOPY     COLONOSCOPY WITH PROPOFOL N/A 03/26/2016   Procedure: COLONOSCOPY WITH PROPOFOL;  Surgeon: Lollie Sails, MD;  Location: [facility] ENDOSCOPY;  Service: Endoscopy;  Laterality: N/A;   CYSTOSCOPY WITH URETEROSCOPY AND STENT PLACEMENT Left 02/21/2018   Procedure: URETEROSCOPY, LASER LITHOTRIPSY, STONE REMOVAL AND STENT PLACEMENT;  Surgeon: Abbie Sons, MD;  Location: [facility] ORS;  Service: Urology;  Laterality: Left;   CYSTOSCOPY/URETEROSCOPY/HOLMIUM LASER/STENT PLACEMENT Right 09/03/2017   Procedure: CYSTOSCOPY/URETEROSCOPY/HOLMIUM LASER/STENT PLACEMENT;  Surgeon: Abbie Sons, MD;  Location: [facility] ORS;  Service: Urology;  Laterality: Right;   ESOPHAGOGASTRODUODENOSCOPY  10/2010   EYE SURGERY  1994 and 2010   RKA in 65 and [place] in 2010   [place] N/A 07/06/2018   Procedure: FLEXIBLE SIGMOIDOSCOPY;  Surgeon: Toledo, Benay Pike, MD;  Location: [facility] ENDOSCOPY;  Service: Gastroenterology;  Laterality: N/A;   HERNIA REPAIR  1970   lower left   HOLMIUM LASER  APPLICATION Left 7/82/4235   Procedure: HOLMIUM LASER APPLICATION;  Surgeon: Abbie Sons, MD;  Location: [facility] ORS;  Service: Urology;  Laterality: Left;   LUMBAR FUSION     L4-L5, L5-S1   SACROILIAC JOINT FUSION     SHOULDER SURGERY Left    SPINAL CORD STIMULATOR IMPLANT      FAMILY HISTORY :   Family History  Problem Relation Age of Onset   Lymphoma Mother    Arthritis Mother        deceased   Cancer Mother    Lung cancer Father    Alcohol abuse Father        deceased   Cancer Father    Heart disease Other        grandparents   Hyperlipidemia Sister    Diabetes Brother    Diabetes Son    Alcohol abuse Brother    Arthritis Brother    Depression Brother    Diabetes Brother    Alcohol abuse Sister    Arthritis Sister    Depression Sister    Alcohol abuse Son    Early death Son        truck accident 7 at age 22    SOCIAL HISTORY:   Social History   Tobacco Use   Smoking status: Former    Packs/day: 1.00    Years: 10.00    Pack years: 10.00    Types: Cigarettes    Quit date: 03/06/1970    Years since quitting: 50.4   Smokeless tobacco: Never   Tobacco comments:    started at age 24 and quit at age 42  Vaping Use   Vaping Use: Never used  Substance Use Topics   Alcohol use: No   Drug use: No    ALLERGIES:  is allergic to bee venom.  MEDICATIONS:  Current Outpatient Medications  Medication Sig Dispense Refill   acetaminophen (TYLENOL) 650 MG CR tablet Take 1,300 mg by mouth every 8 (eight) hours as needed for pain.     cholecalciferol (VITAMIN D3) 25 MCG (1000 UT) tablet Take 2,000 Units by mouth daily.      Multiple Vitamins-Minerals (CENTRUM SILVER ULTRA MENS) TABS Take 1 tablet by mouth daily.     traZODone (DESYREL) 100 MG tablet TAKE 1 TABLET BY MOUTH AT BEDTIME AS NEEDED FOR SLEEP (Patient not taking: Reported on 07/25/2020) 90 tablet 1   No current facility-administered medications for this visit.    PHYSICAL EXAMINATION: ECOG PERFORMANCE  STATUS: 0 - Asymptomatic  BP (!) 147/90   Pulse 72   Temp (!) 97.5 F (36.4 C)   Resp 16   Wt 138 lb 3.2 oz (62.7 kg)   SpO2 100%   BMI 21.65 kg/m   Filed Weights   07/25/20 1509  Weight: 138 lb 3.2 oz (62.7 kg)    Physical Exam HENT:     Head: Normocephalic and atraumatic.     Mouth/Throat:     Pharynx: No oropharyngeal exudate.  Eyes:     Pupils: Pupils are equal, round, and reactive to light.  Cardiovascular:     Rate and Rhythm: Normal rate and regular  rhythm.  Pulmonary:     Effort: No respiratory distress.     Breath sounds: No wheezing.  Abdominal:     General: Bowel sounds are normal. There is no distension.     Palpations: Abdomen is soft. There is no mass.     Tenderness: no abdominal tenderness There is no guarding or rebound.  Musculoskeletal:        General: No tenderness. Normal range of motion.     Cervical back: Normal range of motion and neck supple.  Skin:    General: Skin is warm.  Neurological:     Mental Status: He is alert and oriented to person, place, and time.  Psychiatric:        Mood and Affect: Affect normal.   LABORATORY DATA:  I have reviewed the data as listed    Component Value Date/Time   NA 135 07/18/2020 1037   K 3.9 07/18/2020 1037   CL 102 07/18/2020 1037   CO2 24 07/18/2020 1037   GLUCOSE 144 (H) 07/18/2020 1037   BUN 20 07/18/2020 1037   CREATININE 1.23 07/18/2020 1037   CREATININE 1.27 (H) 11/25/2017 1401   CALCIUM 9.2 07/18/2020 1037   CALCIUM 9.6 12/28/2013 1428   PROT 7.1 07/18/2020 1037   PROT 6.7 06/27/2012 1136   ALBUMIN 4.3 07/18/2020 1037   ALBUMIN 3.5 06/27/2012 1136   AST 25 07/18/2020 1037   AST 17 06/27/2012 1136   ALT 19 07/18/2020 1037   ALT 22 06/27/2012 1136   ALKPHOS 63 07/18/2020 1037   ALKPHOS 91 06/27/2012 1136   BILITOT 0.3 07/18/2020 1037   BILITOT 0.3 06/27/2012 1136   GFRNONAA >60 07/18/2020 1037   GFRNONAA 58 (L) 11/25/2017 1401   GFRAA >60 07/17/2019 0955   GFRAA 67 11/25/2017  1401    No results found for: SPEP, UPEP  Lab Results  Component Value Date   WBC 7.2 07/18/2020   NEUTROABS 3.9 07/18/2020   HGB 14.3 07/18/2020   HCT 40.6 07/18/2020   MCV 92.9 07/18/2020   PLT 343 07/18/2020      Chemistry      Component Value Date/Time   NA 135 07/18/2020 1037   K 3.9 07/18/2020 1037   CL 102 07/18/2020 1037   CO2 24 07/18/2020 1037   BUN 20 07/18/2020 1037   CREATININE 1.23 07/18/2020 1037   CREATININE 1.27 (H) 11/25/2017 1401      Component Value Date/Time   CALCIUM 9.2 07/18/2020 1037   CALCIUM 9.6 12/28/2013 1428   ALKPHOS 63 07/18/2020 1037   ALKPHOS 91 06/27/2012 1136   AST 25 07/18/2020 1037   AST 17 06/27/2012 1136   ALT 19 07/18/2020 1037   ALT 22 06/27/2012 1136   BILITOT 0.3 07/18/2020 1037   BILITOT 0.3 06/27/2012 1136      RADIOGRAPHIC STUDIES: I have personally reviewed the radiological images as listed and agreed with the findings in the report. No results found.   ASSESSMENT & PLAN:  Smoldering multiple myeloma (SMM) ([place]) # SMOLDERING MULTIPLE MYELOMA [2013 s/p BMBx]. No clinical evidence of progression.  Clinically STABLE. June 2022- M protein 0.4 g/dL; kappa lambda light chain ratio ~6- STABLE.  # Arthritis- recommend HOLD off NSAIDS; ok to take tylenol prn as needed. STABLE.     # DISPOSITION:  # follow up in 6 months/labs-MD-cbc/cmp/MM panel/K-L light chains- 1 week prior-Dr.B  Orders Placed This Encounter  Procedures   CBC with Differential/Platelet    Standing Status:   Future  Standing Expiration Date:   07/25/2021   Comprehensive metabolic panel    Standing Status:   Future    Standing Expiration Date:   07/25/2021   Multiple Myeloma Panel (SPEP&IFE w/QIG)    Standing Status:   Future    Standing Expiration Date:   07/25/2021   Kappa/lambda light chains    Standing Status:   Future    Standing Expiration Date:   07/25/2021   All questions were answered. The patient knows to call the clinic with any  problems, questions or concerns.      Cammie Sickle, MD 07/31/2020 9:20 PM

## 2020-07-25 NOTE — Assessment & Plan Note (Addendum)
#  SMOLDERING MULTIPLE MYELOMA [2013 s/p BMBx]. No clinical evidence of progression.  Clinically STABLE. June 2022- M protein 0.4 g/dL; kappa lambda light chain ratio ~6- STABLE.  # Arthritis- recommend HOLD off NSAIDS; ok to take tylenol prn as needed. STABLE.    # DISPOSITION:  # follow up in 6 months/labs-MD-cbc/cmp/MM panel/K-L light chains- 1 week prior-Dr.B

## 2020-07-25 NOTE — Progress Notes (Signed)
Pt states he was covid positive about six weeks ago: mild cough, no fever, was able to take ny-quil and day-quil.

## 2020-11-21 ENCOUNTER — Ambulatory Visit: Payer: Self-pay | Admitting: *Deleted

## 2020-11-21 NOTE — Telephone Encounter (Signed)
Pt called and states he wants recommendations on getting 2nd COVID booster shot. He explained that he had both shots and had last booster shot in 11/2019. Pt had COVID in 05/22 with mild symptoms. He has concerns of whether to get the 2nd booster or not. He's asking for medical advice for the 2nd COVID booster. He also states he hasnt been seen by a provider since his last PCP left the practice. I stated that best recommendation is that the provider can give the medical advice for the boosters. Advised the scheduler will be calling to schedule an appt with a new provider. Pt says he would like to have the same provider as his wife, Delsa Grana, [place]. Advised that this note will be sent to the provider for review and someone would call with recommendations.  Pt verbalized understanding.     Summary: vaccination concerns   The patient shares that they've previously been diagnosed with COVID in May (mild case)   The patient would like to know if they should receive an additional booster   The patient would like to discuss their health concerns and concerns for the booster when possible      Reason for Disposition . [1] Caller requesting NON-URGENT health information AND [2] PCP's office is the best resource  Answer Assessment - Initial Assessment Questions 1. REASON FOR CALL or QUESTION: "What is your reason for calling today?" or "How can I best help you?" or "What question do you have that I can help answer?"     I have questions about the COVID booster  Protocols used: Information Only Call - No Triage-A-AH

## 2020-11-21 NOTE — Telephone Encounter (Signed)
Patient called requesting information due to diagnosed with covid in may mild case. Should he and wife receive additional booster . Please advise.   Called patient and wife and husband answered and requesting to call back due to wife in shower and would like to ask questions together. Patient reports will call back.

## 2020-11-22 NOTE — Telephone Encounter (Signed)
Per Kristeen Miss, pt needs an appoinment to discuss medical advice.Thanks!

## 2020-11-30 ENCOUNTER — Other Ambulatory Visit: Payer: Self-pay

## 2020-11-30 ENCOUNTER — Ambulatory Visit (INDEPENDENT_AMBULATORY_CARE_PROVIDER_SITE_OTHER): Payer: Medicare Other | Admitting: Family Medicine

## 2020-11-30 ENCOUNTER — Encounter: Payer: Self-pay | Admitting: Family Medicine

## 2020-11-30 ENCOUNTER — Ambulatory Visit: Payer: Medicare Other

## 2020-11-30 VITALS — BP 142/78 | HR 96 | Temp 98.1°F | Resp 16 | Ht 67.0 in | Wt 141.2 lb

## 2020-11-30 DIAGNOSIS — Z23 Encounter for immunization: Secondary | ICD-10-CM

## 2020-11-30 DIAGNOSIS — R059 Cough, unspecified: Secondary | ICD-10-CM | POA: Diagnosis not present

## 2020-11-30 DIAGNOSIS — R197 Diarrhea, unspecified: Secondary | ICD-10-CM

## 2020-11-30 DIAGNOSIS — R1110 Vomiting, unspecified: Secondary | ICD-10-CM | POA: Diagnosis not present

## 2020-11-30 LAB — POCT INFLUENZA A/B
Influenza A, POC: NEGATIVE
Influenza B, POC: NEGATIVE

## 2020-11-30 MED ORDER — ONDANSETRON HCL 4 MG PO TABS: 4.0000 mg | ORAL_TABLET | Freq: Three times a day (TID) | ORAL | 0 refills | Status: DC | PRN

## 2020-11-30 NOTE — Patient Instructions (Signed)
It was great to see you!  Our plans for today:  - Continue to stay well hydrated. - Once you feel like eating, try bland foods (See below)  Take care and seek immediate care sooner if you develop any concerns.   Dr. Marty Heck Diet A bland diet consists of foods that are often soft and do not have a lot of fat, fiber, or extra seasonings. Foods without fat, fiber, or seasoning are easier for the body to digest. They are also less likely to irritate your mouth, throat, stomach, and other parts of your digestive system. A bland diet is sometimes called a BRAT diet. What is my plan? Your health care provider or food and nutrition specialist (dietitian) may recommend specific changes to your diet to prevent symptoms or to treat your symptoms. These changes may include: Eating small meals often. Cooking food until it is soft enough to chew easily. Chewing your food well. Drinking fluids slowly. Not eating foods that are very spicy, sour, or fatty. Not eating citrus fruits, such as oranges and grapefruit. What do I need to know about this diet? Eat a variety of foods from the bland diet food list. Do not follow a bland diet longer than needed. Ask your health care provider whether you should take vitamins or supplements. What foods can I eat? Grains Hot cereals, such as cream of wheat. Rice. Bread, crackers, or tortillas made from refined white flour. Vegetables Canned or cooked vegetables. Mashed or boiled potatoes. Fruits Bananas. Applesauce. Other types of cooked or canned fruit with the skin and seeds removed, such as canned peaches or pears. Meats and other proteins Scrambled eggs. Creamy peanut butter or other nut butters. Lean, well-cooked meats, such as chicken or fish. Tofu. Soups or broths. Dairy Low-fat dairy products, such as milk, cottage cheese, or yogurt. Beverages Water. Herbal tea. Apple juice. Fats and oils Mild salad dressings. Canola or olive oil. Sweets and  desserts Pudding. Custard. Fruit gelatin. Ice cream. The items listed above may not be a complete list of recommended foods and beverages. Contact a dietitian for more options. What foods are not recommended? Grains Whole grain breads and cereals. Vegetables Raw vegetables. Fruits Raw fruits, especially citrus, berries, or dried fruits. Dairy Whole fat dairy foods. Beverages Caffeinated drinks. Alcohol. Seasonings and condiments Strongly flavored seasonings or condiments. Hot sauce. Salsa. Other foods Spicy foods. Fried foods. Sour foods, such as pickled or fermented foods. Foods with high sugar content. Foods high in fiber. The items listed above may not be a complete list of foods and beverages to avoid. Contact a dietitian for more information. Summary A bland diet consists of foods that are often soft and do not have a lot of fat, fiber, or extra seasonings. Foods without fat, fiber, or seasoning are easier for the body to digest. Check with your health care provider to see how long you should follow this diet plan. It is not meant to be followed for long periods. This information is not intended to replace advice given to you by your health care provider. Make sure you discuss any questions you have with your health care provider. Document Revised: 02/20/2017 Document Reviewed: 02/20/2017 Elsevier Patient Education  2022 [employer].

## 2020-11-30 NOTE — Progress Notes (Signed)
    SUBJECTIVE:   CHIEF COMPLAINT / HPI:   VIRAL ILLNESS - symptom onset 10/21 - COVID negative Sunday and Monday - s/p colectomy in 2001 for colon cancer, last flex sig in 2020. - h/o multiple myeloma, follows with Onc.  Fever: no Cough: yes, phlegm Shortness of breath: no Chest pain: yes, with cough Chest congestion: no Nasal congestion: no Runny nose: yes Sore throat: yes Sinus pressure: no Headache: yes Ear pain: no  Ear pressure: no  Vomiting: yes with diarrhea Sick contacts: no Context: worse Relief with OTC cold/cough medications: yes  Treatments attempted: nyquil   OBJECTIVE:   BP (!) 142/78   Pulse 96   Temp 98.1 F (36.7 C) (Oral)   Resp 16   Ht $R'5\' 7"'OA$  (1.702 m)   Wt 141 lb 3.2 oz (64 kg)   SpO2 93%   BMI 22.12 kg/m   Gen: ill appearing, in NAD Card: RRR Lungs: CTAB Abd: soft, TTP diffusely, hyperactive BS Ext: WWP, no edema   ASSESSMENT/PLAN:   Flu-like illness Day 5 of symptoms. Flu negative. COVID here pending. Appears well hydrated and not in acute distress. Recommend adequate hydration and supportive care. Rx zofran sent.    Myles Gip, DO

## 2020-12-01 LAB — NOVEL CORONAVIRUS, NAA: SARS-CoV-2, NAA: NOT DETECTED

## 2021-01-16 ENCOUNTER — Other Ambulatory Visit: Payer: Medicare Other

## 2021-01-23 ENCOUNTER — Other Ambulatory Visit: Payer: Self-pay

## 2021-01-23 ENCOUNTER — Inpatient Hospital Stay: Payer: Medicare Other | Attending: Internal Medicine

## 2021-01-23 ENCOUNTER — Ambulatory Visit: Payer: Medicare Other | Admitting: Internal Medicine

## 2021-01-23 DIAGNOSIS — Z9221 Personal history of antineoplastic chemotherapy: Secondary | ICD-10-CM | POA: Diagnosis not present

## 2021-01-23 DIAGNOSIS — M1990 Unspecified osteoarthritis, unspecified site: Secondary | ICD-10-CM | POA: Diagnosis not present

## 2021-01-23 DIAGNOSIS — Z85038 Personal history of other malignant neoplasm of large intestine: Secondary | ICD-10-CM | POA: Insufficient documentation

## 2021-01-23 DIAGNOSIS — Z87891 Personal history of nicotine dependence: Secondary | ICD-10-CM | POA: Insufficient documentation

## 2021-01-23 DIAGNOSIS — D472 Monoclonal gammopathy: Secondary | ICD-10-CM | POA: Insufficient documentation

## 2021-01-23 LAB — CBC WITH DIFFERENTIAL/PLATELET
Abs Immature Granulocytes: 0.11 10*3/uL — ABNORMAL HIGH (ref 0.00–0.07)
Basophils Absolute: 0 10*3/uL (ref 0.0–0.1)
Basophils Relative: 0 %
Eosinophils Absolute: 0.2 10*3/uL (ref 0.0–0.5)
Eosinophils Relative: 2 %
HCT: 41 % (ref 39.0–52.0)
Hemoglobin: 14.1 g/dL (ref 13.0–17.0)
Immature Granulocytes: 1 %
Lymphocytes Relative: 28 %
Lymphs Abs: 2.7 10*3/uL (ref 0.7–4.0)
MCH: 32.6 pg (ref 26.0–34.0)
MCHC: 34.4 g/dL (ref 30.0–36.0)
MCV: 94.9 fL (ref 80.0–100.0)
Monocytes Absolute: 0.6 10*3/uL (ref 0.1–1.0)
Monocytes Relative: 6 %
Neutro Abs: 6 10*3/uL (ref 1.7–7.7)
Neutrophils Relative %: 63 %
Platelets: 351 10*3/uL (ref 150–400)
RBC: 4.32 MIL/uL (ref 4.22–5.81)
RDW: 12.1 % (ref 11.5–15.5)
WBC: 9.6 10*3/uL (ref 4.0–10.5)
nRBC: 0 % (ref 0.0–0.2)

## 2021-01-23 LAB — COMPREHENSIVE METABOLIC PANEL
ALT: 18 U/L (ref 0–44)
AST: 22 U/L (ref 15–41)
Albumin: 4.1 g/dL (ref 3.5–5.0)
Alkaline Phosphatase: 64 U/L (ref 38–126)
Anion gap: 8 (ref 5–15)
BUN: 19 mg/dL (ref 8–23)
CO2: 27 mmol/L (ref 22–32)
Calcium: 9.1 mg/dL (ref 8.9–10.3)
Chloride: 99 mmol/L (ref 98–111)
Creatinine, Ser: 1.06 mg/dL (ref 0.61–1.24)
GFR, Estimated: >60 mL/min (ref >60)
Glucose, Bld: 107 mg/dL — ABNORMAL HIGH (ref 70–99)
Potassium: 3.6 mmol/L (ref 3.5–5.1)
Sodium: 134 mmol/L — ABNORMAL LOW (ref 135–145)
Total Bilirubin: 0.6 mg/dL (ref 0.3–1.2)
Total Protein: 7.3 g/dL (ref 6.5–8.1)

## 2021-01-24 LAB — KAPPA/LAMBDA LIGHT CHAINS
Kappa free light chain: 103.9 mg/L — ABNORMAL HIGH (ref 3.3–19.4)
Kappa, lambda light chain ratio: 5.65 — ABNORMAL HIGH (ref 0.26–1.65)
Lambda free light chains: 18.4 mg/L (ref 5.7–26.3)

## 2021-01-31 LAB — MULTIPLE MYELOMA PANEL, SERUM
Albumin SerPl Elph-Mcnc: 4 g/dL (ref 2.9–4.4)
Albumin/Glob SerPl: 1.5 (ref 0.7–1.7)
Alpha 1: 0.2 g/dL (ref 0.0–0.4)
Alpha2 Glob SerPl Elph-Mcnc: 0.7 g/dL (ref 0.4–1.0)
B-Globulin SerPl Elph-Mcnc: 1 g/dL (ref 0.7–1.3)
Gamma Glob SerPl Elph-Mcnc: 0.9 g/dL (ref 0.4–1.8)
Globulin, Total: 2.8 g/dL (ref 2.2–3.9)
IgA: 366 mg/dL (ref 61–437)
IgG (Immunoglobin G), Serum: 799 mg/dL (ref 603–1613)
IgM (Immunoglobulin M), Srm: 63 mg/dL (ref 15–143)
M Protein SerPl Elph-Mcnc: 0.4 g/dL — ABNORMAL HIGH
Total Protein ELP: 6.8 g/dL (ref 6.0–8.5)

## 2021-02-01 ENCOUNTER — Inpatient Hospital Stay: Payer: Medicare Other | Admitting: Internal Medicine

## 2021-02-01 ENCOUNTER — Other Ambulatory Visit: Payer: Self-pay

## 2021-02-01 ENCOUNTER — Encounter: Payer: Self-pay | Admitting: Internal Medicine

## 2021-02-01 DIAGNOSIS — D472 Monoclonal gammopathy: Secondary | ICD-10-CM | POA: Diagnosis not present

## 2021-02-01 NOTE — Progress Notes (Signed)
[place] OFFICE PROGRESS NOTE  Patient Care Team: Delsa Grana, PA-C as PCP - General (Family Medicine) Cammie Sickle, MD as Consulting Physician (Internal Medicine) Lollie Sails, MD (Inactive) as Consulting Physician (Gastroenterology)   Cancer Staging  No matching staging information was found for the patient.   Oncology History Overview Note  # NOV 2013- SMOLDERING MULTIPLE MYELOMA [IgA Kappa; BMBx- 10-12% plasma cell;Dr.Trillo [place]]   # 2001- COLON CA STAGE III [s/p chemo; [place]]  # colonoscopy- 2018/ Dr.Anna  # ? UTI/kidney stone [Dr.Stoiff]- s/p Anti-biotics. S/p removal   DIAGNOSIS: Smoldering myeloma  GOALS: Cure  CURRENT/MOST RECENT THERAPY: Surveillance    Smoldering multiple myeloma (SMM)   INTERVAL HISTORY:  [name] 70 y.o.  male pleasant patient above history of smoldering myeloma currently on surveillance is here for follow-up.  Patient continues to play golf/bowling.  Chronic mild back pain not any worse.   Review of Systems  Constitutional:  Negative for chills, diaphoresis, fever, malaise/fatigue and weight loss.  HENT:  Negative for nosebleeds and sore throat.   Eyes:  Negative for double vision.  Respiratory:  Negative for cough, hemoptysis, sputum production, shortness of breath and wheezing.   Cardiovascular:  Negative for chest pain, palpitations, orthopnea and leg swelling.  Gastrointestinal:  Negative for abdominal pain, blood in stool, constipation, diarrhea, heartburn, melena, nausea and vomiting.  Genitourinary:  Negative for dysuria, frequency and urgency.  Musculoskeletal:  Positive for back pain and joint pain.  Skin: Negative.  Negative for itching and rash.  Neurological:  Negative for dizziness, tingling, focal weakness, weakness and headaches.  Endo/Heme/Allergies:  Does not bruise/bleed easily.  Psychiatric/Behavioral:  Negative for depression. The patient is not nervous/anxious and does not  have insomnia.      PAST MEDICAL HISTORY :  Past Medical History:  Diagnosis Date   Abnormal laboratory test    Sees Dr. Ma Hillock   Acid reflux    Allergy bee venom   mainly as a child   Anxiety 12/08/1969   Arthritis    Asthma 12/08/1953   mainly as a child   Chronic back pain    Colon cancer ([place]) 11/1999   stage 3 s/p colon resection   Depression    H/O Clostridium difficile infection    History of chemotherapy    5-FU pump/leukovorin   History of kidney stones    Hypothyroidism    NO MEDS NOW   Insomnia    Multiple myeloma ([place]) 12/2011   smoldering vs mgus   Nephrolithiasis 08/28/2017   Osteoporosis    Seasonal allergies     PAST SURGICAL HISTORY :   Past Surgical History:  Procedure Laterality Date   BACK SURGERY     COLON SURGERY  2001   resection, 2nd surgery for scar tissue removal   COLONOSCOPY     COLONOSCOPY WITH PROPOFOL N/A 03/26/2016   Procedure: COLONOSCOPY WITH PROPOFOL;  Surgeon: Lollie Sails, MD;  Location: [facility] ENDOSCOPY;  Service: Endoscopy;  Laterality: N/A;   CYSTOSCOPY WITH URETEROSCOPY AND STENT PLACEMENT Left 02/21/2018   Procedure: URETEROSCOPY, LASER LITHOTRIPSY, STONE REMOVAL AND STENT PLACEMENT;  Surgeon: Abbie Sons, MD;  Location: [facility] ORS;  Service: Urology;  Laterality: Left;   CYSTOSCOPY/URETEROSCOPY/HOLMIUM LASER/STENT PLACEMENT Right 09/03/2017   Procedure: CYSTOSCOPY/URETEROSCOPY/HOLMIUM LASER/STENT PLACEMENT;  Surgeon: Abbie Sons, MD;  Location: [facility] ORS;  Service: Urology;  Laterality: Right;   ESOPHAGOGASTRODUODENOSCOPY  10/2010   EYE SURGERY  1994 and 2010   [place] in 94  and [place] in 2010   FLEXIBLE SIGMOIDOSCOPY N/A 07/06/2018   Procedure: FLEXIBLE SIGMOIDOSCOPY;  Surgeon: Toledo, Benay Pike, MD;  Location: [facility] ENDOSCOPY;  Service: Gastroenterology;  Laterality: N/A;   HERNIA REPAIR  1970   lower left   HOLMIUM LASER APPLICATION Left 0/35/0093   Procedure: HOLMIUM LASER APPLICATION;  Surgeon: Abbie Sons, MD;   Location: [facility] ORS;  Service: Urology;  Laterality: Left;   LUMBAR FUSION     L4-L5, L5-S1   SACROILIAC JOINT FUSION     SHOULDER SURGERY Left    SPINAL CORD STIMULATOR IMPLANT      FAMILY HISTORY :   Family History  Problem Relation Age of Onset   Lymphoma Mother    Arthritis Mother        deceased   Cancer Mother    Lung cancer Father    Alcohol abuse Father        deceased   Cancer Father    Heart disease Other        grandparents   Hyperlipidemia Sister    Diabetes Brother    Diabetes Son    Alcohol abuse Brother    Arthritis Brother    Depression Brother    Diabetes Brother    Alcohol abuse Sister    Arthritis Sister    Depression Sister    Alcohol abuse Son    Early death Son        truck accident 8 at age 68    SOCIAL HISTORY:   Social History   Tobacco Use   Smoking status: Former    Packs/day: 1.00    Years: 10.00    Pack years: 10.00    Types: Cigarettes    Quit date: 03/06/1970    Years since quitting: 50.9   Smokeless tobacco: Never   Tobacco comments:    started at age 32 and quit at age 26  Vaping Use   Vaping Use: Never used  Substance Use Topics   Alcohol use: No   Drug use: No    ALLERGIES:  is allergic to bee venom.  MEDICATIONS:  Current Outpatient Medications  Medication Sig Dispense Refill   acetaminophen (TYLENOL) 650 MG CR tablet Take 1,300 mg by mouth every 8 (eight) hours as needed for pain.     cholecalciferol (VITAMIN D3) 25 MCG (1000 UT) tablet Take 2,000 Units by mouth daily.      Multiple Vitamins-Minerals (CENTRUM SILVER ULTRA MENS) TABS Take 1 tablet by mouth daily.     ondansetron (ZOFRAN) 4 MG tablet Take 1 tablet (4 mg total) by mouth every 8 (eight) hours as needed for nausea or vomiting. 20 tablet 0   No current facility-administered medications for this visit.    PHYSICAL EXAMINATION: ECOG PERFORMANCE STATUS: 0 - Asymptomatic  BP (!) 145/82 (BP Location: Left Arm, Patient Position: Sitting, Cuff Size:  Normal)    Pulse 67    Temp 98.4 F (36.9 C) (Oral)    Ht 5' 7"  (1.702 m)    Wt 143 lb (64.9 kg)    SpO2 100%    BMI 22.40 kg/m   Filed Weights   02/01/21 1441  Weight: 143 lb (64.9 kg)    Physical Exam HENT:     Head: Normocephalic and atraumatic.     Mouth/Throat:     Pharynx: No oropharyngeal exudate.  Eyes:     Pupils: Pupils are equal, round, and reactive to light.  Cardiovascular:     Rate and Rhythm: Normal rate  and regular rhythm.  Pulmonary:     Effort: No respiratory distress.     Breath sounds: No wheezing.  Abdominal:     General: Bowel sounds are normal. There is no distension.     Palpations: Abdomen is soft. There is no mass.     Tenderness: There is no abdominal tenderness. There is no guarding or rebound.  Musculoskeletal:        General: No tenderness. Normal range of motion.     Cervical back: Normal range of motion and neck supple.  Skin:    General: Skin is warm.  Neurological:     Mental Status: He is alert and oriented to person, place, and time.  Psychiatric:        Mood and Affect: Affect normal.   LABORATORY DATA:  I have reviewed the data as listed    Component Value Date/Time   NA 134 (L) 01/23/2021 1125   K 3.6 01/23/2021 1125   CL 99 01/23/2021 1125   CO2 27 01/23/2021 1125   GLUCOSE 107 (H) 01/23/2021 1125   BUN 19 01/23/2021 1125   CREATININE 1.06 01/23/2021 1125   CREATININE 1.27 (H) 11/25/2017 1401   CALCIUM 9.1 01/23/2021 1125   CALCIUM 9.6 12/28/2013 1428   PROT 7.3 01/23/2021 1125   PROT 6.7 06/27/2012 1136   ALBUMIN 4.1 01/23/2021 1125   ALBUMIN 3.5 06/27/2012 1136   AST 22 01/23/2021 1125   AST 17 06/27/2012 1136   ALT 18 01/23/2021 1125   ALT 22 06/27/2012 1136   ALKPHOS 64 01/23/2021 1125   ALKPHOS 91 06/27/2012 1136   BILITOT 0.6 01/23/2021 1125   BILITOT 0.3 06/27/2012 1136   GFRNONAA >60 01/23/2021 1125   GFRNONAA 58 (L) 11/25/2017 1401   GFRAA >60 07/17/2019 0955   GFRAA 67 11/25/2017 1401    No results  found for: SPEP, UPEP  Lab Results  Component Value Date   WBC 9.6 01/23/2021   NEUTROABS 6.0 01/23/2021   HGB 14.1 01/23/2021   HCT 41.0 01/23/2021   MCV 94.9 01/23/2021   PLT 351 01/23/2021      Chemistry      Component Value Date/Time   NA 134 (L) 01/23/2021 1125   K 3.6 01/23/2021 1125   CL 99 01/23/2021 1125   CO2 27 01/23/2021 1125   BUN 19 01/23/2021 1125   CREATININE 1.06 01/23/2021 1125   CREATININE 1.27 (H) 11/25/2017 1401      Component Value Date/Time   CALCIUM 9.1 01/23/2021 1125   CALCIUM 9.6 12/28/2013 1428   ALKPHOS 64 01/23/2021 1125   ALKPHOS 91 06/27/2012 1136   AST 22 01/23/2021 1125   AST 17 06/27/2012 1136   ALT 18 01/23/2021 1125   ALT 22 06/27/2012 1136   BILITOT 0.6 01/23/2021 1125   BILITOT 0.3 06/27/2012 1136      RADIOGRAPHIC STUDIES: I have personally reviewed the radiological images as listed and agreed with the findings in the report. No results found.   ASSESSMENT & PLAN:  Smoldering multiple myeloma (SMM) ([place]) # SMOLDERING MULTIPLE MYELOMA [2013 s/p BMBx]. No clinical evidence of progression.  Clinically STABLE. June 2022- M protein 0.4 g/dL; kappa lambda light chain ratio ~5.65- STABLE.  # Arthritis- recommend HOLD off NSAIDS; ok to take tylenol prn as needed. STABLE.   # DISPOSITION:  # follow up in 6 months/labs-MD-cbc/cmp/MM panel/K-L light chains- 1 week prior-Dr.B   Orders Placed This Encounter  Procedures   CBC with Differential/Platelet    Standing  Status:   Future    Standing Expiration Date:   02/01/2022   Comprehensive metabolic panel    Standing Status:   Future    Standing Expiration Date:   02/01/2022   Multiple Myeloma Panel (SPEP&IFE w/QIG)    Standing Status:   Future    Standing Expiration Date:   02/01/2022   Kappa/lambda light chains    Standing Status:   Future    Standing Expiration Date:   02/01/2022   All questions were answered. The patient knows to call the clinic with any problems, questions  or concerns.      Cammie Sickle, MD 02/01/2021 3:23 PM

## 2021-02-01 NOTE — Assessment & Plan Note (Signed)
#  SMOLDERING MULTIPLE MYELOMA [2013 s/p BMBx]. No clinical evidence of progression.  Clinically STABLE. June 2022- M protein 0.4 g/dL; kappa lambda light chain ratio ~5.65- STABLE.  # Arthritis- recommend HOLD off NSAIDS; ok to take tylenol prn as needed. STABLE.  # DISPOSITION:  # follow up in 6 months/labs-MD-cbc/cmp/MM panel/K-L light chains- 1 week prior-Dr.B

## 2021-03-15 ENCOUNTER — Other Ambulatory Visit: Payer: Self-pay

## 2021-03-15 ENCOUNTER — Ambulatory Visit: Payer: Self-pay

## 2021-03-15 ENCOUNTER — Telehealth (INDEPENDENT_AMBULATORY_CARE_PROVIDER_SITE_OTHER): Payer: Medicare Other | Admitting: Nurse Practitioner

## 2021-03-15 ENCOUNTER — Encounter: Payer: Self-pay | Admitting: Nurse Practitioner

## 2021-03-15 DIAGNOSIS — U071 COVID-19: Secondary | ICD-10-CM

## 2021-03-15 MED ORDER — BENZONATATE 100 MG PO CAPS: 200.0000 mg | ORAL_CAPSULE | Freq: Three times a day (TID) | ORAL | 0 refills | Status: DC | PRN

## 2021-03-15 MED ORDER — NIRMATRELVIR/RITONAVIR (PAXLOVID)TABLET: 3.0000 | ORAL_TABLET | Freq: Two times a day (BID) | ORAL | 0 refills | Status: AC

## 2021-03-15 NOTE — Telephone Encounter (Signed)
Summary: COVID positive, bad cold,queasy stomach,cough headache.   Pt stated he tested positive for COVID today. Pt stated he feels like he has a bad cold,queasy stomach,cough headache.  Pt is requesting medication does not want an appointment.      Chief Complaint: COVID 19 positive Symptoms: Cough, headache,body aches, nausea Frequency: Started Monday Pertinent Negatives: Patient denies fever Disposition: [] ED /[] Urgent Care (no appt availability in office) / [x] Appointment(In office/virtual)/ []  [facility]/ [] Home Care/ [] Refused Recommended Disposition /[] [facility]/ []  Follow-up with PCP Additional Notes:   Reason for Disposition  MILD difficulty breathing (e.g., minimal/no SOB at rest, SOB with walking, pulse <100)  Answer Assessment - Initial Assessment Questions 1. COVID-19 DIAGNOSIS: "Who made your COVID-19 diagnosis?" "Was it confirmed by a positive lab test or self-test?" If not diagnosed by a doctor (or NP/PA), ask "Are there lots of cases (community spread) where you live?" Note: See public health department website, if unsure.     Home test 2. COVID-19 EXPOSURE: "Was there any known exposure to COVID before the symptoms began?" CDC Definition of close contact: within 6 feet (2 meters) for a total of 15 minutes or more over a 24-hour period.      No 3. ONSET: "When did the COVID-19 symptoms start?"      Monday 4. WORST SYMPTOM: "What is your worst symptom?" (e.g., cough, fever, shortness of breath, muscle aches)     Cough,headache 5. COUGH: "Do you have a cough?" If Yes, ask: "How bad is the cough?"       Cough 6. FEVER: "Do you have a fever?" If Yes, ask: "What is your temperature, how was it measured, and when did it start?"     No 7. RESPIRATORY STATUS: "Describe your breathing?" (e.g., shortness of breath, wheezing, unable to speak)      No 8. BETTER-SAME-WORSE: "Are you getting better, staying the same or getting worse compared to yesterday?"   If getting worse, ask, "In what way?"     Worse 9. HIGH RISK DISEASE: "Do you have any chronic medical problems?" (e.g., asthma, heart or lung disease, weak immune system, obesity, etc.)     No 10. VACCINE: "Have you had the COVID-19 vaccine?" If Yes, ask: "Which one, how many shots, when did you get it?"       N/a 11. BOOSTER: "Have you received your COVID-19 booster?" If Yes, ask: "Which one and when did you get it?"       N/a 12. PREGNANCY: "Is there any chance you are pregnant?" "When was your last menstrual period?"       N/a 13. OTHER SYMPTOMS: "Do you have any other symptoms?"  (e.g., chills, fatigue, headache, loss of smell or taste, muscle pain, sore throat)       Headache, body aches 14. O2 SATURATION MONITOR:  "Do you use an oxygen saturation monitor (pulse oximeter) at home?" If Yes, ask "What is your reading (oxygen level) today?" "What is your usual oxygen saturation reading?" (e.g., 95%)       No  Protocols used: Coronavirus (COVID-19) Diagnosed or Suspected-A-AH

## 2021-03-15 NOTE — Progress Notes (Signed)
Name: [name]   MRN: [id number]    DOB: [birth year]   Date:03/15/2021       Progress Note  Subjective  Chief Complaint  Chief Complaint  Patient presents with   Covid Positive    Symptoms started 2/6, Cough, congested, runny nose, bodyaches, fatigue    I connected with  [name]  on 03/15/21 at 09:58 am by a video enabled telemedicine application and verified that I am speaking with the correct person using two identifiers.  I discussed the limitations of evaluation and management by telemedicine and the availability of in person appointments. The patient expressed understanding and agreed to proceed with a virtual visit  Staff also discussed with the patient that there may be a patient responsible charge related to this service. Patient Location: home Provider Location: [facility] Additional Individuals present: alone  HPI  Covid-19: He says symptoms started on 03/13/2021.  He says his symptoms include nasal congestion, runny nose, body aches, fatigue and cough. He denies any shortness of breath or fever.  He says he tested positive for covid.  He says he would like to take an antiviral treatment.  Discussed side effects, checked kidney function and checked for medication interactions.  Discussed quarantining recommendations. Discussed pushing fluids, getting rest. Discussed OTC treatment for symptoms. Will send in prescription for Paxlovid and tessalon perls.    Patient Active Problem List   Diagnosis Date Noted   Spinal cord stimulator status 03/03/2018   Major depressive disorder in partial remission ([place]) 03/03/2018   Ureteral calculus 80/22/3361   Renal colic 22/44/9753   Nephrolithiasis 08/28/2017   History of colon cancer 08/19/2017   OP (osteoporosis) 04/19/2017   Abdominal spasms 01/16/2016   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   Arthritis 05/18/2015   Hyperglycemia 02/16/2015   Anxiety disorder 10/04/2014   Allergic rhinitis 10/04/2014   Gastroesophageal  reflux disease without esophagitis 10/04/2014   Insomnia 09/06/2014    Social History   Tobacco Use   Smoking status: Former    Packs/day: 1.00    Years: 10.00    Pack years: 10.00    Types: Cigarettes    Quit date: 03/06/1970    Years since quitting: 51.0   Smokeless tobacco: Never   Tobacco comments:    started at age 27 and quit at age 25  Substance Use Topics   Alcohol use: No     Current Outpatient Medications:    acetaminophen (TYLENOL) 650 MG CR tablet, Take 1,300 mg by mouth every 8 (eight) hours as needed for pain., Disp: , Rfl:    cholecalciferol (VITAMIN D3) 25 MCG (1000 UT) tablet, Take 2,000 Units by mouth daily. , Disp: , Rfl:    Multiple Vitamins-Minerals (CENTRUM SILVER ULTRA MENS) TABS, Take 1 tablet by mouth daily., Disp: , Rfl:    ondansetron (ZOFRAN) 4 MG tablet, Take 1 tablet (4 mg total) by mouth every 8 (eight) hours as needed for nausea or vomiting., Disp: 20 tablet, Rfl: 0  Allergies  Allergen Reactions   Bee Venom Swelling    I personally reviewed active problem list, medication list, allergies, notes from last encounter with the patient/caregiver today.  ROS  Constitutional: Negative for fever or weight change.  HEENT: positive for nasal congestion, runny nose Respiratory: Positive for cough and negative for shortness of breath.   Cardiovascular: Negative for chest pain or palpitations.  Gastrointestinal: Negative for abdominal pain, no bowel changes.  Musculoskeletal: Negative for gait problem or joint swelling.  Skin:  Negative for rash.  Neurological: Negative for dizziness or headache.  No other specific complaints in a complete review of systems (except as listed in HPI above).   Objective  Virtual encounter, vitals not obtained.  There is no height or weight on file to calculate BMI.  Nursing Note and Vital Signs reviewed.  Physical Exam  Awake, alert and oriented, speaking in complete sentences  No results found for this or  any previous visit (from the past 72 hour(s)).  Assessment & Plan  1. COVID-19 -vitamin C, D and Zinc -otc treatments for symptoms -push fluids, get rest - nirmatrelvir/ritonavir EUA (PAXLOVID) 20 x 150 MG & 10 x 100MG TABS; Take 3 tablets by mouth 2 (two) times daily for 5 days. (Take nirmatrelvir 150 mg two tablets twice daily for 5 days and ritonavir 100 mg one tablet twice daily for 5 days) Patient GFR is >60  Dispense: 30 tablet; Refill: 0 - benzonatate (TESSALON) 100 MG capsule; Take 2 capsules (200 mg total) by mouth 3 (three) times daily as needed for cough.  Dispense: 20 capsule; Refill: 0   -Red flags and when to present for emergency care or RTC including fever >101.37F, chest pain, shortness of breath, new/worsening/un-resolving symptoms,  reviewed with patient at time of visit. Follow up and care instructions discussed and provided in AVS. - I discussed the assessment and treatment plan with the patient. The patient was provided an opportunity to ask questions and all were answered. The patient agreed with the plan and demonstrated an understanding of the instructions.  I provided 15 minutes of non-face-to-face time during this encounter.  Bo Merino, FNP

## 2021-03-16 ENCOUNTER — Ambulatory Visit (INDEPENDENT_AMBULATORY_CARE_PROVIDER_SITE_OTHER): Payer: Medicare Other

## 2021-03-16 DIAGNOSIS — Z1211 Encounter for screening for malignant neoplasm of colon: Secondary | ICD-10-CM | POA: Diagnosis not present

## 2021-03-16 DIAGNOSIS — Z0000 Encounter for general adult medical examination without abnormal findings: Secondary | ICD-10-CM

## 2021-03-16 NOTE — Patient Instructions (Signed)
[name] , Thank you for taking time to come for your Medicare Wellness Visit. I appreciate your ongoing commitment to your health goals. Please review the following plan we discussed and let me know if I can assist you in the future.   Screening recommendations/referrals: Colonoscopy: done 03/26/16. Referral sent today to [facility] for repeat screening colonoscopy. They will contact you for an appointment.  Recommended yearly ophthalmology/optometry visit for glaucoma screening and checkup Recommended yearly dental visit for hygiene and checkup  Vaccinations: Influenza vaccine: done 12/06/20 Pneumococcal vaccine: done 01/09/17 Tdap vaccine: done 07/06/12 Shingles vaccine: Shingrix discussed. Please contact your pharmacy for coverage information.  Covid-19:  done 02/26/19, 03/19/19, 11/06/19  Advanced directives: Please bring a copy of your health care power of attorney and living will to the office at your convenience.   Conditions/risks identified: Keep up the great work!  Next appointment: Follow up in one year for your annual wellness visit.   Preventive Care 73 Years and Older, Male Preventive care refers to lifestyle choices and visits with your health care provider that can promote health and wellness. What does preventive care include? A yearly physical exam. This is also called an annual well check. Dental exams once or twice a year. Routine eye exams. Ask your health care provider how often you should have your eyes checked. Personal lifestyle choices, including: Daily care of your teeth and gums. Regular physical activity. Eating a healthy diet. Avoiding tobacco and drug use. Limiting alcohol use. Practicing safe sex. Taking low doses of aspirin every day. Taking vitamin and mineral supplements as recommended by your health care provider. What happens during an annual well check? The services and screenings done by your health care provider during your annual  well check will depend on your age, overall health, lifestyle risk factors, and family history of disease. Counseling  Your health care provider may ask you questions about your: Alcohol use. Tobacco use. Drug use. Emotional well-being. Home and relationship well-being. Sexual activity. Eating habits. History of falls. Memory and ability to understand (cognition). Work and work Statistician. Screening  You may have the following tests or measurements: Height, weight, and BMI. Blood pressure. Lipid and cholesterol levels. These may be checked every 5 years, or more frequently if you are over 33 years old. Skin check. Lung cancer screening. You may have this screening every year starting at age 42 if you have a 30-pack-year history of smoking and currently smoke or have quit within the past 15 years. Fecal occult blood test (FOBT) of the stool. You may have this test every year starting at age 57. Flexible sigmoidoscopy or colonoscopy. You may have a sigmoidoscopy every 5 years or a colonoscopy every 10 years starting at age 86. Prostate cancer screening. Recommendations will vary depending on your family history and other risks. Hepatitis C blood test. Hepatitis B blood test. Sexually transmitted disease (STD) testing. Diabetes screening. This is done by checking your blood sugar (glucose) after you have not eaten for a while (fasting). You may have this done every 1-3 years. Abdominal aortic aneurysm (AAA) screening. You may need this if you are a current or former smoker. Osteoporosis. You may be screened starting at age 56 if you are at high risk. Talk with your health care provider about your test results, treatment options, and if necessary, the need for more tests. Vaccines  Your health care provider may recommend certain vaccines, such as: Influenza vaccine. This is recommended every year. Tetanus, diphtheria, and acellular  pertussis (Tdap, Td) vaccine. You may need a Td booster  every 10 years. Zoster vaccine. You may need this after age 62. Pneumococcal 13-valent conjugate (PCV13) vaccine. One dose is recommended after age 56. Pneumococcal polysaccharide (PPSV23) vaccine. One dose is recommended after age 39. Talk to your health care provider about which screenings and vaccines you need and how often you need them. This information is not intended to replace advice given to you by your health care provider. Make sure you discuss any questions you have with your health care provider. Document Released: 02/18/2015 Document Revised: 10/12/2015 Document Reviewed: 11/23/2014 Elsevier Interactive Patient Education  2017 [employer] Prevention in the Home Falls can cause injuries. They can happen to people of all ages. There are many things you can do to make your home safe and to help prevent falls. What can I do on the outside of my home? Regularly fix the edges of walkways and driveways and fix any cracks. Remove anything that might make you trip as you walk through a door, such as a raised step or threshold. Trim any bushes or trees on the path to your home. Use bright outdoor lighting. Clear any walking paths of anything that might make someone trip, such as rocks or tools. Regularly check to see if handrails are loose or broken. Make sure that both sides of any steps have handrails. Any raised decks and porches should have guardrails on the edges. Have any leaves, snow, or ice cleared regularly. Use sand or salt on walking paths during winter. Clean up any spills in your garage right away. This includes oil or grease spills. What can I do in the bathroom? Use night lights. Install grab bars by the toilet and in the tub and shower. Do not use towel bars as grab bars. Use non-skid mats or decals in the tub or shower. If you need to sit down in the shower, use a plastic, non-slip stool. Keep the floor dry. Clean up any water that spills on the floor as soon  as it happens. Remove soap buildup in the tub or shower regularly. Attach bath mats securely with double-sided non-slip rug tape. Do not have throw rugs and other things on the floor that can make you trip. What can I do in the bedroom? Use night lights. Make sure that you have a light by your bed that is easy to reach. Do not use any sheets or blankets that are too big for your bed. They should not hang down onto the floor. Have a firm chair that has side arms. You can use this for support while you get dressed. Do not have throw rugs and other things on the floor that can make you trip. What can I do in the kitchen? Clean up any spills right away. Avoid walking on wet floors. Keep items that you use a lot in easy-to-reach places. If you need to reach something above you, use a strong step stool that has a grab bar. Keep electrical cords out of the way. Do not use floor polish or wax that makes floors slippery. If you must use wax, use non-skid floor wax. Do not have throw rugs and other things on the floor that can make you trip. What can I do with my stairs? Do not leave any items on the stairs. Make sure that there are handrails on both sides of the stairs and use them. Fix handrails that are broken or loose. Make sure that handrails  are as long as the stairways. Check any carpeting to make sure that it is firmly attached to the stairs. Fix any carpet that is loose or worn. Avoid having throw rugs at the top or bottom of the stairs. If you do have throw rugs, attach them to the floor with carpet tape. Make sure that you have a light switch at the top of the stairs and the bottom of the stairs. If you do not have them, ask someone to add them for you. What else can I do to help prevent falls? Wear shoes that: Do not have high heels. Have rubber bottoms. Are comfortable and fit you well. Are closed at the toe. Do not wear sandals. If you use a stepladder: Make sure that it is fully  opened. Do not climb a closed stepladder. Make sure that both sides of the stepladder are locked into place. Ask someone to hold it for you, if possible. Clearly [place] and make sure that you can see: Any grab bars or handrails. First and last steps. Where the edge of each step is. Use tools that help you move around (mobility aids) if they are needed. These include: Canes. Walkers. Scooters. Crutches. Turn on the lights when you go into a dark area. Replace any light bulbs as soon as they burn out. Set up your furniture so you have a clear path. Avoid moving your furniture around. If any of your floors are uneven, fix them. If there are any pets around you, be aware of where they are. Review your medicines with your doctor. Some medicines can make you feel dizzy. This can increase your chance of falling. Ask your doctor what other things that you can do to help prevent falls. This information is not intended to replace advice given to you by your health care provider. Make sure you discuss any questions you have with your health care provider. Document Released: 11/18/2008 Document Revised: 06/30/2015 Document Reviewed: 02/26/2014 Elsevier Interactive Patient Education  2017 [employer].
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
## 2021-03-17 ENCOUNTER — Telehealth: Payer: Self-pay | Admitting: Gastroenterology

## 2021-03-17 ENCOUNTER — Telehealth: Payer: Self-pay

## 2021-03-17 NOTE — Telephone Encounter (Signed)
Patient refused to have a conversation with me and asked to be taken off the call list

## 2021-03-17 NOTE — Telephone Encounter (Signed)
Error - duplicate msg

## 2021-03-17 NOTE — Telephone Encounter (Signed)
Pt calling returning call.

## 2021-03-17 NOTE — Telephone Encounter (Signed)
Vm left pt returning call

## 2021-03-20 ENCOUNTER — Telehealth: Payer: Self-pay

## 2021-03-20 ENCOUNTER — Other Ambulatory Visit: Payer: Self-pay

## 2021-03-20 DIAGNOSIS — Z85038 Personal history of other malignant neoplasm of large intestine: Secondary | ICD-10-CM

## 2021-03-20 MED ORDER — CLENPIQ 10-3.5-12 MG-GM -GM/160ML PO SOLN: 1.0000 | ORAL | 0 refills | Status: DC

## 2021-03-20 MED ORDER — SUTAB 1479-225-188 MG PO TABS
12.0000 | ORAL_TABLET | Freq: Once | ORAL | 0 refills | Status: AC
Start: 2021-03-20 — End: 2021-03-20

## 2021-03-20 NOTE — Progress Notes (Signed)
Gastroenterology Pre-Procedure Review  Request Date: 04/05/2021 Requesting Physician: Dr. Vicente Males  PATIENT REVIEW QUESTIONS: The patient responded to the following health history questions as indicated:    1. Are you having any GI issues? no 2. Do you have a personal history of Polyps? yes (had colon cancer 2001 ) 3. Do you have a family history of Colon Cancer or Polyps? yes (mother had a half sister with colon cancer) 4. Diabetes Mellitus? no 5. Joint replacements in the past 12 months?no 6. Major health problems in the past 3 months?no 7. Any artificial heart valves, MVP, or defibrillator?does have a spinal cord stimulater he can turn on and off     MEDICATIONS & ALLERGIES:    Patient reports the following regarding taking any anticoagulation/antiplatelet therapy:   Plavix, Coumadin, Eliquis, Xarelto, Lovenox, Pradaxa, Brilinta, or Effient? no Aspirin? yes (tylonol )  Patient confirms/reports the following medications:  Current Outpatient Medications  Medication Sig Dispense Refill   acetaminophen (TYLENOL) 650 MG CR tablet Take 1,300 mg by mouth every 8 (eight) hours as needed for pain.     benzonatate (TESSALON) 100 MG capsule Take 2 capsules (200 mg total) by mouth 3 (three) times daily as needed for cough. 20 capsule 0   cholecalciferol (VITAMIN D3) 25 MCG (1000 UT) tablet Take 2,000 Units by mouth daily.      Multiple Vitamins-Minerals (CENTRUM SILVER ULTRA MENS) TABS Take 1 tablet by mouth daily.     nirmatrelvir/ritonavir EUA (PAXLOVID) 20 x 150 MG & 10 x 100MG  TABS Take 3 tablets by mouth 2 (two) times daily for 5 days. (Take nirmatrelvir 150 mg two tablets twice daily for 5 days and ritonavir 100 mg one tablet twice daily for 5 days) Patient GFR is >60 30 tablet 0   No current facility-administered medications for this visit.    Patient confirms/reports the following allergies:  Allergies  Allergen Reactions   Bee Venom Swelling    No orders of the defined types were  placed in this encounter.   AUTHORIZATION INFORMATION Primary Insurance: 1D#: Group #:  Secondary Insurance: 1D#: Group #:  SCHEDULE INFORMATION: Date: 04/05/2021 Time: Location:[facility]

## 2021-03-20 NOTE — Telephone Encounter (Signed)
CALLED PATIENT NO ANSWER LEFT VOICEMAIL FOR A CALL BACK ? ?

## 2021-03-20 NOTE — Telephone Encounter (Signed)
Scheduled  for 04/05/2021

## 2021-04-05 ENCOUNTER — Telehealth: Payer: Self-pay

## 2021-04-05 NOTE — Telephone Encounter (Signed)
Called patient back to reschedule sent new refferal and new letters  let endo know  ?

## 2021-04-05 NOTE — Telephone Encounter (Signed)
Trish states patient called there yesterday upset and wanting to reschedule his colonoscopy. He states he called here and no one has called him back. Please call patient and reschedule his colonoscopy and let Trish know where to put him  ?

## 2021-05-31 ENCOUNTER — Ambulatory Visit: Payer: Medicare Other | Admitting: Family Medicine

## 2021-05-31 DIAGNOSIS — R933 Abnormal findings on diagnostic imaging of other parts of digestive tract: Secondary | ICD-10-CM

## 2021-06-15 ENCOUNTER — Encounter: Payer: Self-pay | Admitting: Gastroenterology

## 2021-06-16 ENCOUNTER — Ambulatory Visit
Admission: RE | Admit: 2021-06-16 | Discharge: 2021-06-16 | Disposition: A | Discharge status: Home / Self Care | Payer: Medicare Other | Attending: Gastroenterology | Admitting: Gastroenterology

## 2021-06-16 ENCOUNTER — Encounter: Payer: Self-pay | Admitting: Gastroenterology

## 2021-06-16 ENCOUNTER — Ambulatory Visit: Payer: Medicare Other | Admitting: Anesthesiology

## 2021-06-16 ENCOUNTER — Other Ambulatory Visit: Payer: Self-pay

## 2021-06-16 ENCOUNTER — Encounter
Admission: RE | Disposition: A | Discharge status: Home / Self Care | Payer: Self-pay | Source: Home / Self Care | Attending: Gastroenterology

## 2021-06-16 DIAGNOSIS — K5730 Diverticulosis of large intestine without perforation or abscess without bleeding: Secondary | ICD-10-CM | POA: Insufficient documentation

## 2021-06-16 DIAGNOSIS — Z87891 Personal history of nicotine dependence: Secondary | ICD-10-CM | POA: Insufficient documentation

## 2021-06-16 DIAGNOSIS — Z1211 Encounter for screening for malignant neoplasm of colon: Secondary | ICD-10-CM | POA: Insufficient documentation

## 2021-06-16 DIAGNOSIS — D122 Benign neoplasm of ascending colon: Secondary | ICD-10-CM | POA: Insufficient documentation

## 2021-06-16 DIAGNOSIS — K635 Polyp of colon: Secondary | ICD-10-CM

## 2021-06-16 DIAGNOSIS — Z85038 Personal history of other malignant neoplasm of large intestine: Secondary | ICD-10-CM | POA: Diagnosis not present

## 2021-06-16 DIAGNOSIS — J45909 Unspecified asthma, uncomplicated: Secondary | ICD-10-CM | POA: Diagnosis not present

## 2021-06-16 DIAGNOSIS — Z8579 Personal history of other malignant neoplasms of lymphoid, hematopoietic and related tissues: Secondary | ICD-10-CM | POA: Insufficient documentation

## 2021-06-16 DIAGNOSIS — E039 Hypothyroidism, unspecified: Secondary | ICD-10-CM | POA: Diagnosis not present

## 2021-06-16 DIAGNOSIS — Z86010 Personal history of colonic polyps: Secondary | ICD-10-CM | POA: Insufficient documentation

## 2021-06-16 HISTORY — PX: COLONOSCOPY WITH PROPOFOL: SHX5780

## 2021-06-16 SURGERY — COLONOSCOPY WITH PROPOFOL
Anesthesia: General

## 2021-06-16 MED ORDER — EPHEDRINE 5 MG/ML INJ
INTRAVENOUS | Status: AC
  Filled 2021-06-16: qty 5

## 2021-06-16 MED ORDER — SODIUM CHLORIDE 0.9 % IV SOLN: INTRAVENOUS | Status: DC

## 2021-06-16 MED ORDER — PROPOFOL 10 MG/ML IV BOLUS
INTRAVENOUS | Status: DC | PRN
  Administered 2021-06-16: 80 mg via INTRAVENOUS

## 2021-06-16 MED ORDER — PROPOFOL 500 MG/50ML IV EMUL
INTRAVENOUS | Status: AC
  Filled 2021-06-16: qty 50

## 2021-06-16 MED ORDER — PROPOFOL 500 MG/50ML IV EMUL
INTRAVENOUS | Status: DC | PRN
  Administered 2021-06-16: 150 ug/kg/min via INTRAVENOUS

## 2021-06-16 MED ORDER — PHENYLEPHRINE HCL (PRESSORS) 10 MG/ML IV SOLN
INTRAVENOUS | Status: DC | PRN
  Administered 2021-06-16: 160 ug via INTRAVENOUS

## 2021-06-16 MED ORDER — LIDOCAINE HCL (CARDIAC) PF 100 MG/5ML IV SOSY
PREFILLED_SYRINGE | INTRAVENOUS | Status: DC | PRN
  Administered 2021-06-16: 40 mg via INTRAVENOUS

## 2021-06-16 NOTE — Transfer of Care (Signed)
Immediate Anesthesia Transfer of Care Note ? ?Patient: [name] ? ?Procedure(s) Performed: Procedure(s): ?COLONOSCOPY WITH PROPOFOL (N/A) ? ?Patient Location: PACU and Endoscopy Unit ? ?Anesthesia Type:General ? ?Level of Consciousness: sedated ? ?Airway & Oxygen Therapy: Patient Spontanous Breathing and Patient connected to nasal cannula oxygen ? ?Post-op Assessment: Report given to RN and Post -op Vital signs reviewed and stable ? ?Post vital signs: Reviewed and stable ? ?Last Vitals:  ?Vitals:  ? 06/16/21 0739 06/16/21 0829  ?BP: 135/76 104/66  ?Pulse: 69 67  ?Resp: 16 14  ?Temp: (!) 35.6 ?C   ?SpO2: 99% 99%  ? ? ?Complications: No apparent anesthesia complications ?

## 2021-06-16 NOTE — Anesthesia Preprocedure Evaluation (Addendum)
Anesthesia Evaluation  ?Patient identified by MRN, date of birth, ID band ?Patient awake ? ? ? ?Reviewed: ?Allergy & Precautions, NPO status , Patient's Chart, lab work & pertinent test results ? ?History of Anesthesia Complications ?Negative for: history of anesthetic complications ? ?Airway ?Mallampati: I ? ? ?Neck ROM: Full ? ? ? Dental ?no notable dental hx. ? ?  ?Pulmonary ?asthma , former smoker (quit 1972),  ?  ?Pulmonary exam normal ?breath sounds clear to auscultation ? ? ? ? ? ? Cardiovascular ?Exercise Tolerance: Good ?negative cardio ROS ?Normal cardiovascular exam ?Rhythm:Regular Rate:Normal ? ? ?  ?Neuro/Psych ?PSYCHIATRIC DISORDERS Anxiety Depression negative neurological ROS ?   ? GI/Hepatic ?Colon CA ?  ?Endo/Other  ?Hypothyroidism  ? Renal/GU ?Renal disease (CKD)  ? ?  ?Musculoskeletal ? ?(+) Arthritis ,  ? Abdominal ?  ?Peds ? Hematology ?Multiple myeloma   ?Anesthesia Other Findings ? ? Reproductive/Obstetrics ? ?  ? ? ? ? ? ? ? ? ? ? ? ? ? ?  ?  ? ? ? ? ? ? ? ?Anesthesia Physical ?Anesthesia Plan ? ?ASA: 3 ? ?Anesthesia Plan: General  ? ?Post-op Pain Management:   ? ?Induction: Intravenous ? ?PONV Risk Score and Plan: 2 and Propofol infusion, TIVA and Treatment may vary due to age or medical condition ? ?Airway Management Planned: Natural Airway ? ?Additional Equipment:  ? ?Intra-op Plan:  ? ?Post-operative Plan:  ? ?Informed Consent: I have reviewed the patients History and Physical, chart, labs and discussed the procedure including the risks, benefits and alternatives for the proposed anesthesia with the patient or authorized representative who has indicated his/her understanding and acceptance.  ? ? ? ? ? ?Plan Discussed with: CRNA ? ?Anesthesia Plan Comments: (LMA/GETA backup discussed.  Patient consented for risks of anesthesia including but not limited to:  ?- adverse reactions to medications ?- damage to eyes, teeth, lips or other oral mucosa ?- nerve  damage due to positioning  ?- sore throat or hoarseness ?- damage to heart, brain, nerves, lungs, other parts of body or loss of life ? ?Informed patient about role of CRNA in peri- and intra-operative care.  Patient voiced understanding.)  ? ? ? ? ? ? ?Anesthesia Quick Evaluation ? ?

## 2021-06-16 NOTE — Op Note (Signed)
[facility] ?Gastroenterology ?Patient Name: [name] ?Procedure Date: 06/16/2021 8:09 AM ?MRN: [id number] ?Account #: [id number] ?Date of Birth: 10-25-1950 ?Admit Type: Outpatient ?Age: 71 ?Room: [facility] ENDO ROOM 4 ?Gender: Male ?Note Status: Finalized ?Instrument Name: Colonoscope [id number] ?Procedure:             Colonoscopy ?Indications:           High risk colon cancer surveillance: Personal history  ?                       of colon cancer ?Providers:             Jonathon Bellows MD, MD ?Referring MD:          Delsa Grana (Referring MD) ?Medicines:             Monitored Anesthesia Care ?Complications:         No immediate complications. ?Procedure:             Pre-Anesthesia Assessment: ?                       - Prior to the procedure, a History and Physical was  ?                       performed, and patient medications, allergies and  ?                       sensitivities were reviewed. The patient's tolerance  ?                       of previous anesthesia was reviewed. ?                       - The risks and benefits of the procedure and the  ?                       sedation options and risks were discussed with the  ?                       patient. All questions were answered and informed  ?                       consent was obtained. ?                       - After reviewing the risks and benefits, the patient  ?                       was deemed in satisfactory condition to undergo the  ?                       procedure. ?                       - ASA Grade Assessment: II - A patient with mild  ?                       systemic disease. ?                       After obtaining informed consent, the colonoscope was  ?  passed under direct vision. Throughout the procedure,  ?                       the patient's blood pressure, pulse, and oxygen  ?                       saturations were monitored continuously. The  ?                       Colonoscope was introduced through the anus  and  ?                       advanced to the the cecum, identified by the  ?                       appendiceal orifice. The colonoscopy was performed  ?                       with ease. The patient tolerated the procedure well.  ?                       The quality of the bowel preparation was good. ?Findings: ?     The perianal and digital rectal examinations were normal. ?     A 5 mm polyp was found in the ascending colon. The polyp was sessile.  ?     The polyp was removed with a cold snare. Resection and retrieval were  ?     complete. ?     A few small-mouthed diverticula were found in the entire colon. ?     The exam was otherwise without abnormality on direct and retroflexion  ?     views. ?Impression:            - One 5 mm polyp in the ascending colon, removed with  ?                       a cold snare. Resected and retrieved. ?                       - Diverticulosis in the entire examined colon. ?                       - The examination was otherwise normal on direct and  ?                       retroflexion views. ?Recommendation:        - Discharge patient to home (with escort). ?                       - Resume previous diet. ?                       - Continue present medications. ?                       - Await pathology results. ?                       - Repeat colonoscopy in 5 years for surveillance. ?Procedure Code(s):     --- Professional --- ?  45385, Colonoscopy, flexible; with removal of  ?                       tumor(s), polyp(s), or other lesion(s) by snare  ?                       technique ?Diagnosis Code(s):     --- Professional --- ?                       V25.366, Personal history of other malignant neoplasm  ?                       of large intestine ?                       K63.5, Polyp of colon ?                       K57.30, Diverticulosis of large intestine without  ?                       perforation or abscess without bleeding ?CPT copyright 2019 American Medical  Association. All rights reserved. ?The codes documented in this report are preliminary and upon coder review may  ?be revised to meet current compliance requirements. ?Jonathon Bellows, MD ?Jonathon Bellows MD, MD ?06/16/2021 8:28:29 AM ?This report has been signed electronically. ?Number of Addenda: 0 ?Note Initiated On: 06/16/2021 8:09 AM ?Scope Withdrawal Time: 0 hours 10 minutes 25 seconds  ?Total Procedure Duration: 0 hours 11 minutes 57 seconds  ?Estimated Blood Loss:  Estimated blood loss: none. ?     [facility] ?

## 2021-06-16 NOTE — H&P (Signed)
? ? ? ?Jonathon Bellows, MD ?[address], [place], [place], [place], [address] ?[address], [place], [place], [place], [address] ?Phone: [phone]  ?Fax: [phone] ? ?Primary Care Physician:  Delsa Grana, PA-C ? ? ?Pre-Procedure History & Physical: ?HPI:  [name] is a 71 y.o. male is here for an colonoscopy. ?  ?Past Medical History:  ?Diagnosis Date  ? Abnormal laboratory test   ? Sees Dr. Ma Hillock  ? Acid reflux   ? Allergy bee venom  ? mainly as a child  ? Anxiety 12/08/1969  ? Arthritis   ? Asthma 12/08/1953  ? mainly as a child  ? Chronic back pain   ? Colon cancer ([place]) 11/1999  ? stage 3 s/p colon resection  ? Depression   ? H/O Clostridium difficile infection   ? History of chemotherapy   ? 5-FU pump/leukovorin  ? History of kidney stones   ? Hypothyroidism   ? NO MEDS NOW  ? Insomnia   ? Multiple myeloma ([place]) 12/2011  ? smoldering vs mgus  ? Nephrolithiasis 08/28/2017  ? Osteoporosis   ? Seasonal allergies   ? ? ?Past Surgical History:  ?Procedure Laterality Date  ? BACK SURGERY    ? COLON SURGERY  2001  ? resection, 2nd surgery for scar tissue removal  ? COLONOSCOPY    ? COLONOSCOPY WITH PROPOFOL N/A 03/26/2016  ? Procedure: COLONOSCOPY WITH PROPOFOL;  Surgeon: Lollie Sails, MD;  Location: [facility] ENDOSCOPY;  Service: Endoscopy;  Laterality: N/A;  ? CYSTOSCOPY WITH URETEROSCOPY AND STENT PLACEMENT Left 02/21/2018  ? Procedure: URETEROSCOPY, LASER LITHOTRIPSY, STONE REMOVAL AND STENT PLACEMENT;  Surgeon: Abbie Sons, MD;  Location: [facility] ORS;  Service: Urology;  Laterality: Left;  ? CYSTOSCOPY/URETEROSCOPY/HOLMIUM LASER/STENT PLACEMENT Right 09/03/2017  ? Procedure: CYSTOSCOPY/URETEROSCOPY/HOLMIUM LASER/STENT PLACEMENT;  Surgeon: Abbie Sons, MD;  Location: [facility] ORS;  Service: Urology;  Laterality: Right;  ? ESOPHAGOGASTRODUODENOSCOPY  10/2010  ? [place] and 2010  ? RKA in 31 and [place] in 2010  ? FLEXIBLE SIGMOIDOSCOPY N/A 07/06/2018  ? Procedure: FLEXIBLE SIGMOIDOSCOPY;  Surgeon:  Toledo, Benay Pike, MD;  Location: [facility] ENDOSCOPY;  Service: Gastroenterology;  Laterality: N/A;  ? HERNIA REPAIR  1970  ? lower left  ? HOLMIUM LASER APPLICATION Left 2/75/1700  ? Procedure: HOLMIUM LASER APPLICATION;  Surgeon: Abbie Sons, MD;  Location: [facility] ORS;  Service: Urology;  Laterality: Left;  ? LUMBAR FUSION    ? L4-L5, L5-S1  ? SACROILIAC JOINT FUSION    ? SHOULDER SURGERY Left   ? SPINAL CORD STIMULATOR IMPLANT    ? ? ?Prior to Admission medications   ?Medication Sig Start Date End Date Taking? Authorizing Provider  ?acetaminophen (TYLENOL) 650 MG CR tablet Take 1,300 mg by mouth every 8 (eight) hours as needed for pain.   Yes [provider]  ?cholecalciferol (VITAMIN D3) 25 MCG (1000 UT) tablet Take 2,000 Units by mouth daily.    Yes [provider]  ?Multiple Vitamins-Minerals (CENTRUM SILVER ULTRA MENS) TABS Take 1 tablet by mouth daily.   Yes [provider]  ?benzonatate (TESSALON) 100 MG capsule Take 2 capsules (200 mg total) by mouth 3 (three) times daily as needed for cough. ?Patient not taking: Reported on 06/16/2021 03/15/21   Bo Merino, FNP  ?Sod Picosulfate-Mag Ox-Cit Acd (CLENPIQ) 10-3.5-12 MG-GM -GM/160ML SOLN Take 1 kit by mouth as directed. At 5 PM evening before procedure, drink 1 bottle of Clenpiq, hydrate, drink (5) 8 oz of  water. Then do the same thing 5 hours prior to your procedure. ?Patient not taking: Reported on 06/16/2021 03/20/21   Jonathon Bellows, MD  ? ? ?Allergies as of 03/20/2021 - Review Complete 03/20/2021  ?Allergen Reaction Noted  ? Bee venom Swelling 09/04/2014  ? ? ?Family History  ?Problem Relation Age of Onset  ? Lymphoma Mother   ? Arthritis Mother   ?     deceased  ? Cancer Mother   ? Lung cancer Father   ? Alcohol abuse Father   ?     deceased  ? Cancer Father   ? Heart disease Other   ?     grandparents  ? Hyperlipidemia Sister   ? Diabetes Brother   ? Diabetes Son   ? Alcohol abuse Brother   ? Arthritis Brother   ? Depression  Brother   ? Diabetes Brother   ? Alcohol abuse Sister   ? Arthritis Sister   ? Depression Sister   ? Alcohol abuse Son   ? Early death Son   ?     truck accident 70 at age 25  ? ? ?Social History  ? ?Socioeconomic History  ? Marital status: Married  ?  Spouse name: Not on file  ? Number of children: 2  ? Years of education: Not on file  ? Highest education level: High school graduate  ?Occupational History  ? Occupation: Retired  ?Tobacco Use  ? Smoking status: Former  ?  Packs/day: 1.00  ?  Years: 10.00  ?  Pack years: 10.00  ?  Types: Cigarettes  ?  Quit date: 03/06/1970  ?  Years since quitting: 51.3  ? Smokeless tobacco: Never  ? Tobacco comments:  ?  started at age 17 and quit at age 66  ?Vaping Use  ? Vaping Use: Never used  ?Substance and Sexual Activity  ? Alcohol use: No  ? Drug use: No  ? Sexual activity: Yes  ?  Birth control/protection: None  ?Other Topics Concern  ? Not on file  ?Social History Narrative  ? Pt is an avid golfer and bowls every week  ? ?Social Determinants of Health  ? ?Financial Resource Strain: Low Risk   ? Difficulty of Paying Living Expenses: Not hard at all  ?Food Insecurity: No Food Insecurity  ? Worried About Charity fundraiser in the Last Year: Never true  ? Ran Out of Food in the Last Year: Never true  ?Transportation Needs: No Transportation Needs  ? Lack of Transportation (Medical): No  ? Lack of Transportation (Non-Medical): No  ?Physical Activity: Insufficiently Active  ? Days of Exercise per Week: 2 days  ? Minutes of Exercise per Session: 60 min  ?Stress: No Stress Concern Present  ? Feeling of Stress : Only a little  ?Social Connections: Moderately Isolated  ? Frequency of Communication with Friends and Family: More than three times a week  ? Frequency of Social Gatherings with Friends and Family: More than three times a week  ? Attends Religious Services: Never  ? Active Member of Clubs or Organizations: No  ? Attends Archivist Meetings: Never  ? Marital  Status: Married  ?Intimate Partner Violence: Not At Risk  ? Fear of Current or Ex-Partner: No  ? Emotionally Abused: No  ? Physically Abused: No  ? Sexually Abused: No  ? ? ?Review of Systems: ?See HPI, otherwise negative ROS ? ?Physical Exam: ?BP 135/76   Pulse 69   Temp (!) 96.1 ?  F (35.6 ?C)   Resp 16   Ht 5' 7"  (1.702 m)   Wt 63 kg   SpO2 99%   BMI 21.77 kg/m?  ?General:   Alert,  pleasant and cooperative in NAD ?Head:  Normocephalic and atraumatic. ?Neck:  Supple; no masses or thyromegaly. ?Lungs:  Clear throughout to auscultation, normal respiratory effort.    ?Heart:  +S1, +S2, Regular rate and rhythm, No edema. ?Abdomen:  Soft, nontender and nondistended. Normal bowel sounds, without guarding, and without rebound.   ?Neurologic:  Alert and  oriented x4;  grossly normal neurologically. ? ?Impression/Plan: ?WALDRON GERRY is here for an colonoscopy to be performed for history of colon cancer. Risks, benefits, limitations, and alternatives regarding  colonoscopy have been reviewed with the patient.  Questions have been answered.  All parties agreeable. ? ? ?Jonathon Bellows, MD  06/16/2021, 8:04 AM ? ?

## 2021-06-16 NOTE — Anesthesia Postprocedure Evaluation (Signed)
Anesthesia Post Note ? ?Patient: [name] ? ?Procedure(s) Performed: COLONOSCOPY WITH PROPOFOL ? ?Patient location during evaluation: PACU ?Anesthesia Type: General ?Level of consciousness: awake and alert, oriented and patient cooperative ?Pain management: pain level controlled ?Vital Signs Assessment: post-procedure vital signs reviewed and stable ?Respiratory status: spontaneous breathing, nonlabored ventilation and respiratory function stable ?Cardiovascular status: blood pressure returned to baseline and stable ?Postop Assessment: adequate PO intake ?Anesthetic complications: no ? ? ?No notable events documented. ? ? ?Last Vitals:  ?Vitals:  ? 06/16/21 0829 06/16/21 0839  ?BP: 104/66 122/73  ?Pulse: 67 (!) 59  ?Resp: 14 11  ?Temp:    ?SpO2: 99% 97%  ?  ?Last Pain:  ?Vitals:  ? 06/16/21 0839  ?PainSc: 0-No pain  ? ? ?  ?  ?  ?  ?  ?  ? ?Darrin Nipper ? ? ? ? ?

## 2021-06-19 LAB — SURGICAL PATHOLOGY

## 2021-06-22 ENCOUNTER — Encounter: Payer: Self-pay | Admitting: Gastroenterology

## 2021-07-26 ENCOUNTER — Other Ambulatory Visit: Payer: Self-pay

## 2021-07-26 ENCOUNTER — Inpatient Hospital Stay: Payer: Medicare Other | Attending: Internal Medicine

## 2021-07-26 DIAGNOSIS — Z87891 Personal history of nicotine dependence: Secondary | ICD-10-CM | POA: Diagnosis not present

## 2021-07-26 DIAGNOSIS — D472 Monoclonal gammopathy: Secondary | ICD-10-CM | POA: Insufficient documentation

## 2021-07-26 DIAGNOSIS — Z85038 Personal history of other malignant neoplasm of large intestine: Secondary | ICD-10-CM | POA: Insufficient documentation

## 2021-07-26 DIAGNOSIS — M1990 Unspecified osteoarthritis, unspecified site: Secondary | ICD-10-CM | POA: Insufficient documentation

## 2021-07-26 DIAGNOSIS — Z9221 Personal history of antineoplastic chemotherapy: Secondary | ICD-10-CM | POA: Insufficient documentation

## 2021-07-26 LAB — COMPREHENSIVE METABOLIC PANEL
ALT: 16 U/L (ref 0–44)
AST: 20 U/L (ref 15–41)
Albumin: 4.5 g/dL (ref 3.5–5.0)
Alkaline Phosphatase: 58 U/L (ref 38–126)
Anion gap: 7 (ref 5–15)
BUN: 26 mg/dL — ABNORMAL HIGH (ref 8–23)
CO2: 27 mmol/L (ref 22–32)
Calcium: 9.5 mg/dL (ref 8.9–10.3)
Chloride: 102 mmol/L (ref 98–111)
Creatinine, Ser: 1.43 mg/dL — ABNORMAL HIGH (ref 0.61–1.24)
GFR, Estimated: 52 mL/min — ABNORMAL LOW (ref >60)
Glucose, Bld: 103 mg/dL — ABNORMAL HIGH (ref 70–99)
Potassium: 3.9 mmol/L (ref 3.5–5.1)
Sodium: 136 mmol/L (ref 135–145)
Total Bilirubin: 0.7 mg/dL (ref 0.3–1.2)
Total Protein: 7.8 g/dL (ref 6.5–8.1)

## 2021-07-26 LAB — CBC WITH DIFFERENTIAL/PLATELET
Abs Immature Granulocytes: 0.02 10*3/uL (ref 0.00–0.07)
Basophils Absolute: 0 10*3/uL (ref 0.0–0.1)
Basophils Relative: 0 %
Eosinophils Absolute: 0.2 10*3/uL (ref 0.0–0.5)
Eosinophils Relative: 2 %
HCT: 46.7 % (ref 39.0–52.0)
Hemoglobin: 16.1 g/dL (ref 13.0–17.0)
Immature Granulocytes: 0 %
Lymphocytes Relative: 32 %
Lymphs Abs: 2.7 10*3/uL (ref 0.7–4.0)
MCH: 32.7 pg (ref 26.0–34.0)
MCHC: 34.5 g/dL (ref 30.0–36.0)
MCV: 94.9 fL (ref 80.0–100.0)
Monocytes Absolute: 0.6 10*3/uL (ref 0.1–1.0)
Monocytes Relative: 7 %
Neutro Abs: 5 10*3/uL (ref 1.7–7.7)
Neutrophils Relative %: 59 %
Platelets: 360 10*3/uL (ref 150–400)
RBC: 4.92 MIL/uL (ref 4.22–5.81)
RDW: 11.8 % (ref 11.5–15.5)
WBC: 8.6 10*3/uL (ref 4.0–10.5)
nRBC: 0 % (ref 0.0–0.2)

## 2021-07-26 LAB — PSA: Prostatic Specific Antigen: 2.01 ng/mL (ref 0.00–4.00)

## 2021-07-27 LAB — KAPPA/LAMBDA LIGHT CHAINS
Kappa free light chain: 102.8 mg/L — ABNORMAL HIGH (ref 3.3–19.4)
Kappa, lambda light chain ratio: 7.14 — ABNORMAL HIGH (ref 0.26–1.65)
Lambda free light chains: 14.4 mg/L (ref 5.7–26.3)

## 2021-07-31 ENCOUNTER — Encounter: Payer: Self-pay | Admitting: Urology

## 2021-07-31 ENCOUNTER — Ambulatory Visit: Payer: Medicare Other | Admitting: Urology

## 2021-07-31 VITALS — BP 164/80 | HR 61 | Ht 67.0 in | Wt 139.0 lb

## 2021-07-31 DIAGNOSIS — R3912 Poor urinary stream: Secondary | ICD-10-CM | POA: Diagnosis not present

## 2021-07-31 DIAGNOSIS — N3941 Urge incontinence: Secondary | ICD-10-CM

## 2021-07-31 DIAGNOSIS — R3911 Hesitancy of micturition: Secondary | ICD-10-CM | POA: Diagnosis not present

## 2021-07-31 DIAGNOSIS — R3915 Urgency of urination: Secondary | ICD-10-CM

## 2021-07-31 DIAGNOSIS — R350 Frequency of micturition: Secondary | ICD-10-CM

## 2021-07-31 DIAGNOSIS — N401 Enlarged prostate with lower urinary tract symptoms: Secondary | ICD-10-CM

## 2021-07-31 LAB — URINALYSIS, COMPLETE
Bilirubin, UA: NEGATIVE
Glucose, UA: NEGATIVE
Ketones, UA: NEGATIVE
Leukocytes,UA: NEGATIVE
Nitrite, UA: NEGATIVE
RBC, UA: NEGATIVE
Specific Gravity, UA: >1.03 — ABNORMAL HIGH (ref 1.005–1.030)
Urobilinogen, Ur: 0.2 mg/dL (ref 0.2–1.0)
pH, UA: 5 (ref 5.0–7.5)

## 2021-07-31 LAB — MICROSCOPIC EXAMINATION
Bacteria, UA: NONE SEEN
WBC, UA: NONE SEEN /hpf (ref 0–5)

## 2021-07-31 LAB — MULTIPLE MYELOMA PANEL, SERUM
Albumin SerPl Elph-Mcnc: 4.1 g/dL (ref 2.9–4.4)
Albumin/Glob SerPl: 1.4 (ref 0.7–1.7)
Alpha 1: 0.2 g/dL (ref 0.0–0.4)
Alpha2 Glob SerPl Elph-Mcnc: 0.7 g/dL (ref 0.4–1.0)
B-Globulin SerPl Elph-Mcnc: 1 g/dL (ref 0.7–1.3)
Gamma Glob SerPl Elph-Mcnc: 1.1 g/dL (ref 0.4–1.8)
Globulin, Total: 3.1 g/dL (ref 2.2–3.9)
IgA: 404 mg/dL (ref 61–437)
IgG (Immunoglobin G), Serum: 829 mg/dL (ref 603–1613)
IgM (Immunoglobulin M), Srm: 60 mg/dL (ref 15–143)
M Protein SerPl Elph-Mcnc: 0.4 g/dL — ABNORMAL HIGH
Total Protein ELP: 7.2 g/dL (ref 6.0–8.5)

## 2021-07-31 LAB — BLADDER SCAN AMB NON-IMAGING: Scan Result: 0

## 2021-07-31 MED ORDER — TAMSULOSIN HCL 0.4 MG PO CAPS: 0.4000 mg | ORAL_CAPSULE | Freq: Every day | ORAL | 0 refills | Status: DC

## 2021-07-31 NOTE — Progress Notes (Signed)
07/31/2021 10:38 AM   Emeterio Reeve 1950/04/20 [id number]  Primary provider: Danelle Berry, PA-C [address] [place],  [place] [address]  Chief Complaint  Patient presents with   Urinary Frequency    HPI: [name] is a 71 y.o. male self-referred for evaluation of lower urinary tract symptoms.  Several month history of bothersome LUTS including urinary hesitancy, weak stream, frequency, urgency with occasional episodes of urge incontinence IPSS 29/35 Denies dysuria, gross hematuria No flank, abdominal or pelvic pain Prior history of stone disease with ureteroscopic removal    PMH: Past Medical History:  Diagnosis Date   Abnormal laboratory test    Sees Dr. Sherrlyn Hock   Acid reflux    Allergy bee venom   mainly as a child   Anxiety 12/08/1969   Arthritis    Asthma 12/08/1953   mainly as a child   Chronic back pain    Colon cancer ([facility]) 11/1999   stage 3 s/p colon resection   Depression    H/O Clostridium difficile infection    History of chemotherapy    5-FU pump/leukovorin   History of kidney stones    Hypothyroidism    NO MEDS NOW   Insomnia    Multiple myeloma ([facility]) 12/2011   smoldering vs mgus   Nephrolithiasis 08/28/2017   Osteoporosis    Seasonal allergies     Surgical History: Past Surgical History:  Procedure Laterality Date   BACK SURGERY     COLON SURGERY  2001   resection, 2nd surgery for scar tissue removal   COLONOSCOPY     COLONOSCOPY WITH PROPOFOL N/A 03/26/2016   Procedure: COLONOSCOPY WITH PROPOFOL;  Surgeon: Christena Deem, MD;  Location: [facility] ENDOSCOPY;  Service: Endoscopy;  Laterality: N/A;   COLONOSCOPY WITH PROPOFOL N/A 06/16/2021   Procedure: COLONOSCOPY WITH PROPOFOL;  Surgeon: Wyline Mood, MD;  Location: [facility] ENDOSCOPY;  Service: Gastroenterology;  Laterality: N/A;   CYSTOSCOPY WITH URETEROSCOPY AND STENT PLACEMENT Left 02/21/2018   Procedure: URETEROSCOPY, LASER LITHOTRIPSY, STONE REMOVAL AND STENT PLACEMENT;   Surgeon: Riki Altes, MD;  Location: [facility] ORS;  Service: Urology;  Laterality: Left;   CYSTOSCOPY/URETEROSCOPY/HOLMIUM LASER/STENT PLACEMENT Right 09/03/2017   Procedure: CYSTOSCOPY/URETEROSCOPY/HOLMIUM LASER/STENT PLACEMENT;  Surgeon: Riki Altes, MD;  Location: [facility] ORS;  Service: Urology;  Laterality: Right;   ESOPHAGOGASTRODUODENOSCOPY  10/2010   EYE SURGERY  1994 and 2010   RKA in 94 and [place] in 2010   FLEXIBLE SIGMOIDOSCOPY N/A 07/06/2018   Procedure: FLEXIBLE SIGMOIDOSCOPY;  Surgeon: Toledo, Boykin Nearing, MD;  Location: [facility] ENDOSCOPY;  Service: Gastroenterology;  Laterality: N/A;   HERNIA REPAIR  1970   lower left   HOLMIUM LASER APPLICATION Left 02/21/2018   Procedure: HOLMIUM LASER APPLICATION;  Surgeon: Riki Altes, MD;  Location: [facility] ORS;  Service: Urology;  Laterality: Left;   LUMBAR FUSION     L4-L5, L5-S1   SACROILIAC JOINT FUSION     SHOULDER SURGERY Left    SPINAL CORD STIMULATOR IMPLANT      Home Medications:  Allergies as of 07/31/2021       Reactions   Bee Venom Swelling        Medication List        Accurate as of July 31, 2021 10:38 AM. If you have any questions, ask your nurse or doctor.          STOP taking these medications    benzonatate 100 MG capsule Commonly known as: TESSALON Stopped by: Lorin Picket  Arnaldo Natal, MD   Clenpiq 10-3.5-12 MG-GM -GM/160ML Soln Generic drug: Sod Picosulfate-Mag Ox-Cit Acd Stopped by: Riki Altes, MD       TAKE these medications    acetaminophen 650 MG CR tablet Commonly known as: TYLENOL Take 1,300 mg by mouth every 8 (eight) hours as needed for pain.   Centrum Silver Ultra Mens Tabs Take 1 tablet by mouth daily.   cholecalciferol 25 MCG (1000 UNIT) tablet Commonly known as: VITAMIN D3 Take 2,000 Units by mouth daily.   tamsulosin 0.4 MG Caps capsule Commonly known as: FLOMAX Take 1 capsule (0.4 mg total) by mouth daily. Started by: Riki Altes, MD        Allergies:  Allergies   Allergen Reactions   Bee Venom Swelling    Family History: Family History  Problem Relation Age of Onset   Lymphoma Mother    Arthritis Mother        deceased   Cancer Mother    Lung cancer Father    Alcohol abuse Father        deceased   Cancer Father    Heart disease Other        grandparents   Hyperlipidemia Sister    Diabetes Brother    Diabetes Son    Alcohol abuse Brother    Arthritis Brother    Depression Brother    Diabetes Brother    Alcohol abuse Sister    Arthritis Sister    Depression Sister    Alcohol abuse Son    Early death Son        truck accident 44 at age 24    Social History:  reports that he quit smoking about 51 years ago. His smoking use included cigarettes. He has a 10.00 pack-year smoking history. He has never used smokeless tobacco. He reports that he does not drink alcohol and does not use drugs.   Physical Exam: BP (!) 164/80   Pulse 61   Ht 5\' 7"  (1.702 m)   Wt 139 lb (63 kg)   BMI 21.77 kg/m   Constitutional:  Alert and oriented, No acute distress. HEENT:  AT Respiratory: Normal respiratory effort, no increased work of breathing. GU: Prostate 50 g, smooth without nodules Psychiatric: Normal mood and affect.  Laboratory Data:  Urinalysis Dipstick/microscopy negative    Assessment & Plan:   72 y.o. male with obstructive/storage elated voiding symptoms most likely secondary to BPH Severe symptoms by IPSS PVR 0 mL Urinalysis unremarkable Initially recommended medical management and Rx tamsulosin sent to pharmacy Follow-up 1 month for symptom recheck   Riki Altes, MD  [facility] [address], [address] [place], [place] [address] [phone]

## 2021-08-02 ENCOUNTER — Encounter: Payer: Self-pay | Admitting: Internal Medicine

## 2021-08-02 ENCOUNTER — Inpatient Hospital Stay: Payer: Medicare Other | Admitting: Internal Medicine

## 2021-08-02 DIAGNOSIS — D472 Monoclonal gammopathy: Secondary | ICD-10-CM | POA: Diagnosis not present

## 2021-08-02 NOTE — Progress Notes (Signed)
Patient reports decrease in appetite.  Currently having urology work up.  Has nausea in the morning.

## 2021-08-02 NOTE — Assessment & Plan Note (Addendum)
#  SMOLDERING MULTIPLE MYELOMA [2013 s/p BMBx]. No clinical evidence of progression.  Clinically STABLE. JUNE 2023- M protein 0.4 g/dL; kappa lambda light chain ratio ~7.14- STABLE.  Do not suspect any clinical progression of multiple myeloma causing slightly abnormal GFR.  # GFR- 54; s/p evaluation with Dr.Stoioff-PSA June 2023 2.0 on Flomax. Monitor.   # Arthritis- recommend HOLD off NSAIDS; ok to take tylenol prn as needed. STABLE.  # DISPOSITION:  # follow up in [place] 2024-1 week prior labs-MD-cbc/cmp/MM panel/K-L light chains- -Dr.B

## 2021-08-02 NOTE — Progress Notes (Signed)
[name]  Patient Care Team: Delsa Grana, PA-C as PCP - General (Family Medicine) Cammie Sickle, MD as Consulting Physician (Internal Medicine)   Cancer Staging  No matching staging information was found for the patient.   Oncology History Overview [name]  # NOV 2013- SMOLDERING MULTIPLE MYELOMA [IgA Kappa; BMBx- 10-12% plasma cell;Dr.Trillo [place]]   # 2001- COLON CA STAGE III [s/p chemo; [place]]  # colonoscopy- 2018/ Dr.Anna  # ? UTI/kidney stone [Dr.Stoiff]- s/p Anti-biotics. S/p removal   DIAGNOSIS: Smoldering myeloma  GOALS: Cure  CURRENT/MOST RECENT THERAPY: Surveillance    Smoldering multiple myeloma (SMM)   INTERVAL HISTORY: Ambulating independently.  Alone.  [name] 71 y.o.  male pleasant patient above history of smoldering myeloma currently on surveillance is here for follow-up.  Patient continues to play golf/bowling.  Chronic mild back pain not any worse.  Has been using Tylenol no other NSAIDs.  Has had problems with urination pelvic discomfort.  Status post evaluation with Dr. Bernardo Heater recently.  On Flomax  Review of Systems  Constitutional:  Negative for chills, diaphoresis, fever, malaise/fatigue and weight loss.  HENT:  Negative for nosebleeds and sore throat.   Eyes:  Negative for double vision.  Respiratory:  Negative for cough, hemoptysis, sputum production, shortness of breath and wheezing.   Cardiovascular:  Negative for chest pain, palpitations, orthopnea and leg swelling.  Gastrointestinal:  Negative for abdominal pain, blood in stool, constipation, diarrhea, heartburn, melena, nausea and vomiting.  Genitourinary:  Negative for dysuria, frequency and urgency.  Musculoskeletal:  Positive for back pain and joint pain.  Skin: Negative.  Negative for itching and rash.  Neurological:  Negative for dizziness, tingling, focal weakness, weakness and headaches.  Endo/Heme/Allergies:  Does not  bruise/bleed easily.  Psychiatric/Behavioral:  Negative for depression. The patient is not nervous/anxious and does not have insomnia.       PAST MEDICAL HISTORY :  Past Medical History:  Diagnosis Date   Abnormal laboratory test    Sees Dr. Ma Hillock   Acid reflux    Allergy bee venom   mainly as a child   Anxiety 12/08/1969   Arthritis    Asthma 12/08/1953   mainly as a child   Chronic back pain    Colon cancer ([place]) 11/1999   stage 3 s/p colon resection   Depression    H/O Clostridium difficile infection    History of chemotherapy    5-FU pump/leukovorin   History of kidney stones    Hypothyroidism    NO MEDS NOW   Insomnia    Multiple myeloma ([place]) 12/2011   smoldering vs mgus   Nephrolithiasis 08/28/2017   Osteoporosis    Seasonal allergies     PAST SURGICAL HISTORY :   Past Surgical History:  Procedure Laterality Date   BACK SURGERY     COLON SURGERY  2001   resection, 2nd surgery for scar tissue removal   COLONOSCOPY     COLONOSCOPY WITH PROPOFOL N/A 03/26/2016   Procedure: COLONOSCOPY WITH PROPOFOL;  Surgeon: Lollie Sails, MD;  Location[facility] ENDOSCOPY;  Service: Endoscopy;  Laterality: N/A;   COLONOSCOPY WITH PROPOFOL N/A 06/16/2021   Procedure: COLONOSCOPY WITH PROPOFOL;  Surgeon: Jonathon Bellows, MD;  Location: [facility] ENDOSCOPY;  Service: Gastroenterology;  Laterality: N/A;   CYSTOSCOPY WITH URETEROSCOPY AND STENT PLACEMENT Left 02/21/2018   Procedure: URETEROSCOPY, LASER LITHOTRIPSY, STONE REMOVAL AND STENT PLACEMENT;  Surgeon: Abbie Sons, MD;  Location: [facility] ORS;  Service: Urology;  Laterality: Left;   CYSTOSCOPY/URETEROSCOPY/HOLMIUM LASER/STENT PLACEMENT Right 09/03/2017   Procedure: CYSTOSCOPY/URETEROSCOPY/HOLMIUM LASER/STENT PLACEMENT;  Surgeon: Abbie Sons, MD;  Location: [facility] ORS;  Service: Urology;  Laterality: Right;   ESOPHAGOGASTRODUODENOSCOPY  10/2010   EYE SURGERY  1994 and 2010   RKA in 94 and [place] in 2010   [place] N/A  07/06/2018   Procedure: FLEXIBLE SIGMOIDOSCOPY;  Surgeon: Toledo, Benay Pike, MD;  Location: [facility] ENDOSCOPY;  Service: Gastroenterology;  Laterality: N/A;   HERNIA REPAIR  1970   lower left   HOLMIUM LASER APPLICATION Left 5/45/6256   Procedure: HOLMIUM LASER APPLICATION;  Surgeon: Abbie Sons, MD;  Location: [facility] ORS;  Service: Urology;  Laterality: Left;   LUMBAR FUSION     L4-L5, L5-S1   SACROILIAC JOINT FUSION     SHOULDER SURGERY Left    SPINAL CORD STIMULATOR IMPLANT      FAMILY HISTORY :   Family History  Problem Relation Age of Onset   Lymphoma Mother    Arthritis Mother        deceased   Cancer Mother    Lung cancer Father    Alcohol abuse Father        deceased   Cancer Father    Heart disease Other        grandparents   Hyperlipidemia Sister    Diabetes Brother    Diabetes Son    Alcohol abuse Brother    Arthritis Brother    Depression Brother    Diabetes Brother    Alcohol abuse Sister    Arthritis Sister    Depression Sister    Alcohol abuse Son    Early death Son        truck accident 62 at age 8    SOCIAL HISTORY:   Social History   Tobacco Use   Smoking status: Former    Packs/day: 1.00    Years: 10.00    Total pack years: 10.00    Types: Cigarettes    Quit date: 03/06/1970    Years since quitting: 51.4   Smokeless tobacco: Never   Tobacco comments:    started at age 74 and quit at age 28  Vaping Use   Vaping Use: Never used  Substance Use Topics   Alcohol use: No   Drug use: No    ALLERGIES:  is allergic to bee venom.  MEDICATIONS:  Current Outpatient Medications  Medication Sig Dispense Refill   acetaminophen (TYLENOL) 650 MG CR tablet Take 1,300 mg by mouth every 8 (eight) hours as needed for pain.     cholecalciferol (VITAMIN D3) 25 MCG (1000 UT) tablet Take 2,000 Units by mouth daily.      Multiple Vitamins-Minerals (CENTRUM SILVER ULTRA MENS) TABS Take 1 tablet by mouth daily.     tamsulosin (FLOMAX) 0.4 MG CAPS capsule  Take 1 capsule (0.4 mg total) by mouth daily. 30 capsule 0   No current facility-administered medications for this visit.    PHYSICAL EXAMINATION: ECOG PERFORMANCE STATUS: 0 - Asymptomatic  BP 133/82 (BP Location: Left Arm, Patient Position: Sitting)   Pulse 95   Temp 99.8 F (37.7 C) (Tympanic)   Resp 18   Ht _0  (1.702 m)   Wt 138 lb 9.6 oz (62.9 kg)   BMI 21.71 kg/m   Filed Weights   08/02/21 1339  Weight: 138 lb 9.6 oz (62.9 kg)    Physical Exam HENT:     Head: Normocephalic and atraumatic.  Mouth/Throat:     Pharynx: No oropharyngeal exudate.  Eyes:     Pupils: Pupils are equal, round, and reactive to light.  Cardiovascular:     Rate and Rhythm: Normal rate and regular rhythm.  Pulmonary:     Effort: No respiratory distress.     Breath sounds: No wheezing.  Abdominal:     General: Bowel sounds are normal. There is no distension.     Palpations: Abdomen is soft. There is no mass.     Tenderness: There is no abdominal tenderness. There is no guarding or rebound.  Musculoskeletal:        General: No tenderness. Normal range of motion.     Cervical back: Normal range of motion and neck supple.  Skin:    General: Skin is warm.  Neurological:     Mental Status: He is alert and oriented to person, place, and time.  Psychiatric:        Mood and Affect: Affect normal.    LABORATORY DATA:  I have reviewed the data as listed    Component Value Date/Time   NA 136 07/26/2021 1040   K 3.9 07/26/2021 1040   CL 102 07/26/2021 1040   CO2 27 07/26/2021 1040   GLUCOSE 103 (H) 07/26/2021 1040   BUN 26 (H) 07/26/2021 1040   CREATININE 1.43 (H) 07/26/2021 1040   CREATININE 1.27 (H) 11/25/2017 1401   CALCIUM 9.5 07/26/2021 1040   CALCIUM 9.6 12/28/2013 1428   PROT 7.8 07/26/2021 1040   PROT 6.7 06/27/2012 1136   ALBUMIN 4.5 07/26/2021 1040   ALBUMIN 3.5 06/27/2012 1136   AST 20 07/26/2021 1040   AST 17 06/27/2012 1136   ALT 16 07/26/2021 1040   ALT 22  06/27/2012 1136   ALKPHOS 58 07/26/2021 1040   ALKPHOS 91 06/27/2012 1136   BILITOT 0.7 07/26/2021 1040   BILITOT 0.3 06/27/2012 1136   GFRNONAA 52 (L) 07/26/2021 1040   GFRNONAA 58 (L) 11/25/2017 1401   GFRAA >60 07/17/2019 0955   GFRAA 67 11/25/2017 1401    No results found for: "SPEP", "UPEP"  Lab Results  Component Value Date   WBC 8.6 07/26/2021   NEUTROABS 5.0 07/26/2021   HGB 16.1 07/26/2021   HCT 46.7 07/26/2021   MCV 94.9 07/26/2021   PLT 360 07/26/2021      Chemistry      Component Value Date/Time   NA 136 07/26/2021 1040   K 3.9 07/26/2021 1040   CL 102 07/26/2021 1040   CO2 27 07/26/2021 1040   BUN 26 (H) 07/26/2021 1040   CREATININE 1.43 (H) 07/26/2021 1040   CREATININE 1.27 (H) 11/25/2017 1401      Component Value Date/Time   CALCIUM 9.5 07/26/2021 1040   CALCIUM 9.6 12/28/2013 1428   ALKPHOS 58 07/26/2021 1040   ALKPHOS 91 06/27/2012 1136   AST 20 07/26/2021 1040   AST 17 06/27/2012 1136   ALT 16 07/26/2021 1040   ALT 22 06/27/2012 1136   BILITOT 0.7 07/26/2021 1040   BILITOT 0.3 06/27/2012 1136      RADIOGRAPHIC STUDIES: I have personally reviewed the radiological images as listed and agreed with the findings in the report. No results found.   ASSESSMENT & PLAN:  Smoldering multiple myeloma (SMM) ([place]) # SMOLDERING MULTIPLE MYELOMA [2013 s/p BMBx]. No clinical evidence of progression.  Clinically STABLE. JUNE 2023- M protein 0.4 g/dL; kappa lambda light chain ratio ~7.14- STABLE.  Do not suspect any clinical progression of multiple myeloma  causing slightly abnormal GFR.  # GFR- 54; s/p evaluation with Dr.Stoioff-PSA June 2023 2.0 on Flomax. Monitor.   # Arthritis- recommend HOLD off NSAIDS; ok to take tylenol prn as needed. STABLE.   # DISPOSITION:  # follow up in [place] 2024-1 week prior labs-MD-cbc/cmp/MM panel/K-L light chains- -Dr.B   Orders Placed This Encounter  Procedures   CBC with Differential/Platelet    Standing Status:    Future    Standing Expiration Date:   08/03/2022   Comprehensive metabolic panel    Standing Status:   Future    Standing Expiration Date:   08/03/2022   Multiple Myeloma Panel (SPEP&IFE w/QIG)    Standing Status:   Future    Standing Expiration Date:   08/03/2022   Kappa/lambda light chains    Standing Status:   Future    Standing Expiration Date:   08/03/2022   All questions were answered. The patient knows to call the clinic with any problems, questions or concerns.      Cammie Sickle, MD 08/02/2021 2:21 PM

## 2021-08-28 ENCOUNTER — Encounter: Payer: Self-pay | Admitting: Urology

## 2021-08-28 ENCOUNTER — Ambulatory Visit (INDEPENDENT_AMBULATORY_CARE_PROVIDER_SITE_OTHER): Payer: Medicare Other | Admitting: Urology

## 2021-08-28 VITALS — BP 155/90 | HR 55 | Ht 67.0 in | Wt 138.0 lb

## 2021-08-28 DIAGNOSIS — N3941 Urge incontinence: Secondary | ICD-10-CM

## 2021-08-28 DIAGNOSIS — N401 Enlarged prostate with lower urinary tract symptoms: Secondary | ICD-10-CM | POA: Diagnosis not present

## 2021-08-28 DIAGNOSIS — R3912 Poor urinary stream: Secondary | ICD-10-CM

## 2021-08-28 DIAGNOSIS — R3911 Hesitancy of micturition: Secondary | ICD-10-CM

## 2021-08-28 DIAGNOSIS — R350 Frequency of micturition: Secondary | ICD-10-CM

## 2021-08-28 LAB — MICROSCOPIC EXAMINATION: Bacteria, UA: NONE SEEN

## 2021-08-28 LAB — URINALYSIS, COMPLETE
Bilirubin, UA: NEGATIVE
Glucose, UA: NEGATIVE
Ketones, UA: NEGATIVE
Leukocytes,UA: NEGATIVE
Nitrite, UA: NEGATIVE
Protein,UA: NEGATIVE
RBC, UA: NEGATIVE
Specific Gravity, UA: 1.02 (ref 1.005–1.030)
Urobilinogen, Ur: 0.2 mg/dL (ref 0.2–1.0)
pH, UA: 6 (ref 5.0–7.5)

## 2021-08-28 MED ORDER — GEMTESA 75 MG PO TABS: 75.0000 mg | ORAL_TABLET | Freq: Every day | ORAL | 0 refills | Status: DC

## 2021-08-28 NOTE — Progress Notes (Signed)
08/28/2021 10:37 AM   [name] 09-02-1950 [id number]  Primary provider: Delsa Grana, PA-C [address] [place],  [place] [address]  Chief Complaint  Patient presents with   Benign Prostatic Hypertrophy    HPI: 71 y.o. male presents for 1 month follow-up visit.  Seen 07/31/2021 with several month history of bothersome LUTS including urinary hesitancy, weak stream, frequency, urgency with occasional episodes of urge incontinence IPSS 29/35 Given a trial of tamsulosin end presents for follow-up. No significant improvement in his voiding pattern Most bothersome symptoms at present are urge and urge incontinence He took tamsulosin for 2 weeks and then discontinued secondary to nausea   PMH: Past Medical History:  Diagnosis Date   Abnormal laboratory test    Sees Dr. Ma Hillock   Acid reflux    Allergy bee venom   mainly as a child   Anxiety 12/08/1969   Arthritis    Asthma 12/08/1953   mainly as a child   Chronic back pain    Colon cancer ([place]) 11/1999   stage 3 s/p colon resection   Depression    H/O Clostridium difficile infection    History of chemotherapy    5-FU pump/leukovorin   History of kidney stones    Hypothyroidism    NO MEDS NOW   Insomnia    Multiple myeloma ([place]) 12/2011   smoldering vs mgus   Nephrolithiasis 08/28/2017   Osteoporosis    Seasonal allergies     Surgical History: Past Surgical History:  Procedure Laterality Date   BACK SURGERY     COLON SURGERY  2001   resection, 2nd surgery for scar tissue removal   COLONOSCOPY     COLONOSCOPY WITH PROPOFOL N/A 03/26/2016   Procedure: COLONOSCOPY WITH PROPOFOL;  Surgeon: Lollie Sails, MD;  Location[facility] ENDOSCOPY;  Service: Endoscopy;  Laterality: N/A;   COLONOSCOPY WITH PROPOFOL N/A 06/16/2021   Procedure: COLONOSCOPY WITH PROPOFOL;  Surgeon: Jonathon Bellows, MD;  Location: [facility] ENDOSCOPY;  Service: Gastroenterology;  Laterality: N/A;   CYSTOSCOPY WITH URETEROSCOPY AND STENT  PLACEMENT Left 02/21/2018   Procedure: URETEROSCOPY, LASER LITHOTRIPSY, STONE REMOVAL AND STENT PLACEMENT;  Surgeon: Abbie Sons, MD;  Location: [facility] ORS;  Service: Urology;  Laterality: Left;   CYSTOSCOPY/URETEROSCOPY/HOLMIUM LASER/STENT PLACEMENT Right 09/03/2017   Procedure: CYSTOSCOPY/URETEROSCOPY/HOLMIUM LASER/STENT PLACEMENT;  Surgeon: Abbie Sons, MD;  Location: [facility] ORS;  Service: Urology;  Laterality: Right;   ESOPHAGOGASTRODUODENOSCOPY  10/2010   EYE SURGERY  1994 and 2010   RKA in 94 and [place] in 2010   [place] N/A 07/06/2018   Procedure: FLEXIBLE SIGMOIDOSCOPY;  Surgeon: Toledo, Benay Pike, MD;  Location: [facility] ENDOSCOPY;  Service: Gastroenterology;  Laterality: N/A;   HERNIA REPAIR  1970   lower left   HOLMIUM LASER APPLICATION Left 05/03/9240   Procedure: HOLMIUM LASER APPLICATION;  Surgeon: Abbie Sons, MD;  Location: [facility] ORS;  Service: Urology;  Laterality: Left;   LUMBAR FUSION     L4-L5, L5-S1   SACROILIAC JOINT FUSION     SHOULDER SURGERY Left    SPINAL CORD STIMULATOR IMPLANT      Home Medications:  Allergies as of 08/28/2021       Reactions   Bee Venom Swelling        Medication List        Accurate as of August 28, 2021 10:37 AM. If you have any questions, ask your nurse or doctor.          acetaminophen 650  MG CR tablet Commonly known as: TYLENOL Take 1,300 mg by mouth every 8 (eight) hours as needed for pain.   Centrum Silver Ultra Mens Tabs Take 1 tablet by mouth daily.   cholecalciferol 25 MCG (1000 UNIT) tablet Commonly known as: VITAMIN D3 Take 2,000 Units by mouth daily.   tamsulosin 0.4 MG Caps capsule Commonly known as: FLOMAX Take 1 capsule (0.4 mg total) by mouth daily.        Allergies:  Allergies  Allergen Reactions   Bee Venom Swelling    Family History: Family History  Problem Relation Age of Onset   Lymphoma Mother    Arthritis Mother        deceased   Cancer Mother    Lung cancer Father     Alcohol abuse Father        deceased   Cancer Father    Heart disease Other        grandparents   Hyperlipidemia Sister    Diabetes Brother    Diabetes Son    Alcohol abuse Brother    Arthritis Brother    Depression Brother    Diabetes Brother    Alcohol abuse Sister    Arthritis Sister    Depression Sister    Alcohol abuse Son    Early death Son        truck accident 55 at age 35    Social History:  reports that he quit smoking about 51 years ago. His smoking use included cigarettes. He has a 10.00 pack-year smoking history. He has never used smokeless tobacco. He reports that he does not drink alcohol and does not use drugs.   Physical Exam: BP (!) 155/90   Pulse (!) 55   Ht 5' 7"  (1.702 m)   Wt 138 lb (62.6 kg)   BMI 21.61 kg/m   Constitutional:  Alert and oriented, No acute distress. HEENT: [place] AT Respiratory: Normal respiratory effort, no increased work of breathing. Psychiatric: Normal mood and affect.  Laboratory Data:  Urinalysis Dipstick/microscopy negative   Assessment & Plan:    1.  Lower urinary tract symptoms Severe LUTS, no improvement on tamsulosin Trial Gemtesa 75 mg daily since storage related symptoms are most bothersome Follow-up 1 month for symptom recheck and they have still with bothersome symptoms will perform cystoscopy at that visit   Abbie Sons, [place] [address] [place], [place] [address] [phone]

## 2021-09-28 ENCOUNTER — Ambulatory Visit: Payer: Medicare Other | Admitting: Urology

## 2021-09-28 ENCOUNTER — Encounter: Payer: Self-pay | Admitting: Urology

## 2021-09-28 VITALS — BP 181/107 | HR 61 | Ht 70.0 in | Wt 137.0 lb

## 2021-09-28 DIAGNOSIS — N401 Enlarged prostate with lower urinary tract symptoms: Secondary | ICD-10-CM

## 2021-09-28 DIAGNOSIS — N3941 Urge incontinence: Secondary | ICD-10-CM

## 2021-09-28 DIAGNOSIS — R399 Unspecified symptoms and signs involving the genitourinary system: Secondary | ICD-10-CM | POA: Diagnosis not present

## 2021-09-28 LAB — URINALYSIS, COMPLETE
Bilirubin, UA: NEGATIVE
Glucose, UA: NEGATIVE
Ketones, UA: NEGATIVE
Leukocytes,UA: NEGATIVE
Nitrite, UA: NEGATIVE
Protein,UA: NEGATIVE
RBC, UA: NEGATIVE
Specific Gravity, UA: 1.02 (ref 1.005–1.030)
Urobilinogen, Ur: 0.2 mg/dL (ref 0.2–1.0)
pH, UA: 6 (ref 5.0–7.5)

## 2021-09-28 LAB — MICROSCOPIC EXAMINATION: Bacteria, UA: NONE SEEN

## 2021-09-29 ENCOUNTER — Telehealth: Payer: Self-pay

## 2021-09-29 DIAGNOSIS — R399 Unspecified symptoms and signs involving the genitourinary system: Secondary | ICD-10-CM

## 2021-09-29 NOTE — Telephone Encounter (Signed)
Patient called in and states that after much discussion with discussion with his wife at home he has decided he would like to proceed with PT as discussed in clinic yesterday. I informed him that someone from Dr. Dagoberto Reef team would be reaching out to him.

## 2021-10-01 ENCOUNTER — Encounter: Payer: Self-pay | Admitting: Urology

## 2021-10-01 NOTE — Progress Notes (Signed)
   10/01/21  CC:  Chief Complaint  Patient presents with   Cysto    HPI: Severe lower urinary tract symptoms; no improvement with tamsulosin or Gemtesa  Blood pressure (!) 181/107, pulse 61, height '5\' 10"'$  (1.778 m), weight 137 lb (62.1 kg). NED. A&Ox3.   No respiratory distress   Abd soft, NT, ND Normal phallus with bilateral descended testicles  Cystoscopy Procedure Note  Patient identification was confirmed, informed consent was obtained, and patient was prepped using Betadine solution.  Lidocaine jelly was administered per urethral meatus.     Pre-Procedure: - Inspection reveals a normal caliber urethral meatus.  Procedure: The flexible cystoscope was introduced without difficulty - No urethral strictures/lesions are present. - No significant lateral lobe enlargement prostate  - Moderate elevation bladder neck - Bilateral ureteral orifices identified - Bladder mucosa  reveals no ulcers, tumors, or lesions - No bladder stones - No trabeculation  Retroflexion shows no abnormalities   Post-Procedure: - Patient tolerated the procedure well  Assessment/ Plan: No endoscopic lateral lobe occlusion We discussed options of urodynamic study to assess for outlet obstruction and the possibility that TUIP may be beneficial We also discussed lower urinary tract symptoms secondary to pelvic floor abnormalities and consideration of pelvic floor PT was also discussed He stated at the time of office visit he wanted to pursue urodynamic study however called back later in the week requesting PT referral   Abbie Sons, MD

## 2021-10-23 ENCOUNTER — Ambulatory Visit: Payer: Self-pay

## 2021-10-23 ENCOUNTER — Ambulatory Visit (INDEPENDENT_AMBULATORY_CARE_PROVIDER_SITE_OTHER): Payer: Medicare Other | Admitting: Nurse Practitioner

## 2021-10-23 ENCOUNTER — Other Ambulatory Visit: Payer: Self-pay

## 2021-10-23 ENCOUNTER — Encounter: Payer: Self-pay | Admitting: Nurse Practitioner

## 2021-10-23 VITALS — BP 120/72 | HR 73 | Temp 98.6°F | Resp 16 | Ht 67.0 in | Wt 136.9 lb

## 2021-10-23 DIAGNOSIS — M940 Chondrocostal junction syndrome [Tietze]: Secondary | ICD-10-CM

## 2021-10-23 MED ORDER — TIZANIDINE HCL 4 MG PO TABS: 2.0000 mg | ORAL_TABLET | Freq: Three times a day (TID) | ORAL | 2 refills | Status: DC | PRN

## 2021-10-23 MED ORDER — PREDNISONE 10 MG (21) PO TBPK: ORAL_TABLET | ORAL | 0 refills | Status: DC

## 2021-10-23 MED ORDER — DICLOFENAC SODIUM 75 MG PO TBEC: 75.0000 mg | DELAYED_RELEASE_TABLET | Freq: Two times a day (BID) | ORAL | 0 refills | Status: AC

## 2021-10-23 NOTE — Progress Notes (Signed)
BP 120/72   Pulse 73   Temp 98.6 F (37 C) (Oral)   Resp 16   Ht '5\' 7"'$  (1.702 m)   Wt 136 lb 14.4 oz (62.1 kg)   SpO2 97%   BMI 21.44 kg/m    Subjective:    Patient ID: [name], male    DOB: 1950/11/22, 71 y.o.   MRN: [id number]  HPI: [name] is a 71 y.o. male  Chief Complaint  Patient presents with   Chest Pain    Strained 5 days ago   Chest wall pain: Patient reports that on Thursday he went bowling and when he was putting the balls back he felt a twinge on his right chest wall.  He says he has been treating with tylenol, tens unit and heat therapy with little improvement. He denies any shortness of breath or mid sternal chest pain. Pain does not radiate. Pain is described as sharp with movement.  Pain is reproducible.  Discussed this is likely costochondritis.  Will start steroid taper, muscle relaxer and NSAID. Discussed with patient that his kidney function was 52 on 07/26/2021. Encouraged patient to push fluids.  Discussed signs and symptoms to seek emergency care.    Relevant past medical, surgical, family and social history reviewed and updated as indicated. Interim medical history since our last visit reviewed. Allergies and medications reviewed and updated.  Review of Systems  Constitutional: Negative for fever or weight change.  Respiratory: Negative for cough and shortness of breath.   Cardiovascular: Negative for chest pain or palpitations.  Gastrointestinal: Negative for abdominal pain, no bowel changes.  Musculoskeletal: Negative for gait problem or joint swelling.  Skin: Negative for rash.  MSK: positive for chest wall pain Neurological: Negative for dizziness or headache.  No other specific complaints in a complete review of systems (except as listed in HPI above).      Objective:    BP 120/72   Pulse 73   Temp 98.6 F (37 C) (Oral)   Resp 16   Ht '5\' 7"'$  (1.702 m)   Wt 136 lb 14.4 oz (62.1 kg)   SpO2 97%   BMI 21.44 kg/m   Wt Readings  from Last 3 Encounters:  10/23/21 136 lb 14.4 oz (62.1 kg)  09/28/21 137 lb (62.1 kg)  08/28/21 138 lb (62.6 kg)    Physical Exam  Constitutional: Patient appears well-developed and well-nourished.  No distress.  HEENT: head atraumatic, normocephalic, pupils equal and reactive to light, neck supple Cardiovascular: Normal rate, regular rhythm and normal heart sounds.  No murmur heard. No BLE edema. Pulmonary/Chest: Effort normal and breath sounds normal. No respiratory distress. Abdominal: Soft.  There is no tenderness. MSK: tenderness noted on the right side of patient chest under nipple Psychiatric: Patient has a normal mood and affect. behavior is normal. Judgment and thought content normal.  Results for orders placed or performed in visit on 09/28/21  Microscopic Examination   Urine  Result Value Ref Range   WBC, UA 0-5 0 - 5 /hpf   RBC, Urine 0-2 0 - 2 /hpf   Epithelial Cells (non renal) 0-10 0 - 10 /hpf   Bacteria, UA None seen None seen/Few  Urinalysis, Complete  Result Value Ref Range   Specific Gravity, UA 1.020 1.005 - 1.030   pH, UA 6.0 5.0 - 7.5   Color, UA Yellow Yellow   Appearance Ur Clear Clear   Leukocytes,UA Negative Negative   Protein,UA Negative Negative/Trace  Glucose, UA Negative Negative   Ketones, UA Negative Negative   RBC, UA Negative Negative   Bilirubin, UA Negative Negative   Urobilinogen, Ur 0.2 0.2 - 1.0 mg/dL   Nitrite, UA Negative Negative   Microscopic Examination See below:       Assessment & Plan:   Problem List Items Addressed This Visit   None Visit Diagnoses     Costochondritis, acute    -  Primary   start steroid taper, muscle relaxer and NSAID.  Push fluids due to reduced kidney function.  Conintue with heat therapy   Relevant Medications   predniSONE (STERAPRED UNI-PAK 21 TAB) 10 MG (21) TBPK tablet   tiZANidine (ZANAFLEX) 4 MG tablet   diclofenac (VOLTAREN) 75 MG EC tablet        Follow up plan: Return if symptoms  worsen or fail to improve.

## 2021-10-23 NOTE — Telephone Encounter (Signed)
  Chief Complaint: Muscle pain on chest Symptoms: pain 7/10 Frequency: Thursday night Pertinent Negatives: Patient denies fever - true chest pain Disposition: '[]'$ ED /'[]'$ Urgent Care (no appt availability in office) / '[x]'$ Appointment(In office/virtual)/ '[]'$  [facility]/ '[]'$ Home Care/ '[]'$ Refused Recommended Disposition /'[]'$ [facility]/ '[]'$  Follow-up with PCP Additional Notes: PT went bowling Thursday night. When pt was putting the bowling balls into the car he felt a twinge on his left side. Pt states that this has developed into muscle pain under his left breast. Pain is constant. Pt has used tylenol, heat and a tens unit with some relief.  Pt does not feel that his current treatment plan with provide resolution in a timely manner.   Summary: Rib cage pain 7/10 advice   Pt is calling to report pain in his right rib cage pain 7 out 10. Pt feels that he has pulled a muscele. Pt is requesting appt today. No available appts please advise      Reason for Disposition  [1] MODERATE pain (e.g., interferes with normal activities) AND [2] present > 3 days  Answer Assessment - Initial Assessment Questions 1. ONSET: "When did the muscle aches or body pains start?"      Thursday - after bowling 2. LOCATION: "What part of your body is hurting?" (e.g., entire body, arms, legs)      Left side chest 3. SEVERITY: "How bad is the pain?" (Scale 1-10; or mild, moderate, severe)   - MILD (1-3): doesn't interfere with normal activities    - MODERATE (4-7): interferes with normal activities or awakens from sleep    - SEVERE (8-10):  excruciating pain, unable to do any normal activities      7/10 4. CAUSE: "What do you think is causing the pains?"     Pulled muscle 5. FEVER: "Have you been having fever?"     no 6. OTHER SYMPTOMS: "Do you have any other symptoms?" (e.g., chest pain, weakness, rash, cold or flu symptoms, weight loss)     no 7. PREGNANCY: "Is there any chance you are pregnant?"  "When was your last menstrual period?"     no 8. TRAVEL: "Have you traveled out of the country in the last month?" (e.g., travel history, exposures)     no  Protocols used: Muscle Aches and Body Pain-A-AH

## 2021-11-08 ENCOUNTER — Ambulatory Visit: Payer: Medicare Other | Attending: Urology | Admitting: Physical Therapy

## 2021-11-08 DIAGNOSIS — R278 Other lack of coordination: Secondary | ICD-10-CM

## 2021-11-08 DIAGNOSIS — R2689 Other abnormalities of gait and mobility: Secondary | ICD-10-CM | POA: Diagnosis present

## 2021-11-08 DIAGNOSIS — R399 Unspecified symptoms and signs involving the genitourinary system: Secondary | ICD-10-CM | POA: Diagnosis not present

## 2021-11-08 DIAGNOSIS — M533 Sacrococcygeal disorders, not elsewhere classified: Secondary | ICD-10-CM | POA: Diagnosis present

## 2021-11-08 DIAGNOSIS — M5459 Other low back pain: Secondary | ICD-10-CM | POA: Diagnosis present

## 2021-11-08 NOTE — Therapy (Signed)
OUTPATIENT PHYSICAL THERAPY EVALUATION   Patient Name: YASHAR INCLAN MRN: [id number] DOB:11-17-1950, 71 y.o., male Today's Date: 11/08/2021   PT End of Session - 11/08/21 0851     Visit Number 1    Number of Visits 10    Date for PT Re-Evaluation 01/17/22    PT Start Time 7416             Past Medical History:  Diagnosis Date   Abnormal laboratory test    Sees Dr. Ma Hillock   Acid reflux    Allergy bee venom   mainly as a child   Anxiety 12/08/1969   Arthritis    Asthma 12/08/1953   mainly as a child   Chronic back pain    Colon cancer ([place]) 11/1999   stage 3 s/p colon resection   Depression    H/O Clostridium difficile infection    History of chemotherapy    5-FU pump/leukovorin   History of kidney stones    Hypothyroidism    NO MEDS NOW   Insomnia    Multiple myeloma ([place]) 12/2011   smoldering vs mgus   Nephrolithiasis 08/28/2017   Osteoporosis    Seasonal allergies    Past Surgical History:  Procedure Laterality Date   BACK SURGERY     COLON SURGERY  2001   resection, 2nd surgery for scar tissue removal   COLONOSCOPY     COLONOSCOPY WITH PROPOFOL N/A 03/26/2016   Procedure: COLONOSCOPY WITH PROPOFOL;  Surgeon: Lollie Sails, MD;  Location[facility] END[facility]COPY;  Service: Endoscopy;  Laterality: N/A;   COLONOSCOPY WITH PROPOFOL N/A 06/16/2021   Procedure: COLONOSCOPY WITH PROPOFOL;  Surgeon: Jonathon Bellows, MD;  Location: [facility] ENDOSCOPY;  Service: Gastroenterology;  Laterality: N/A;   CYSTOSCOPY WITH URETEROSCOPY AND STENT PLACEMENT Left 02/21/2018   Procedure: URETEROSCOPY, LASER LITHOTRIPSY, STONE REMOVAL AND STENT PLACEMENT;  Surgeon: Abbie Sons, MD;  Location: [facility] ORS;  Service: Urology;  Laterality: Left;   CYSTOSCOPY/URETEROSCOPY/HOLMIUM LASER/STENT PLACEMENT Right 09/03/2017   Procedure: CYSTOSCOPY/URETEROSCOPY/HOLMIUM LASER/STENT PLACEMENT;  Surgeon: Abbie Sons, MD;  Location: [facility] ORS;  Service: Urology;  Laterality: Right;    ESOPHAGOGASTRODUODENOSCOPY  10/2010   EYE SURGERY  1994 and 2010   [place] in 94 and [place] in 2010   [place] N/A 07/06/2018   Procedure: FLEXIBLE SIGMOIDOSCOPY;  Surgeon: Tol[place], Benay Pike, MD;  Location: [facility] ENDOSCOPY;  Service: Gastroenterology;  Laterality: N/A;   HERNIA REPAIR  1970   lower left   HOLMIUM LASER APPLICATION Left 3/84/5364   Procedure: HOLMIUM LASER APPLICATION;  Surgeon: Abbie Sons, MD;  Location: [facility] ORS;  Service: Urology;  Laterality: Left;   LUMBAR FUSION     L4-L5, L5-S1   SACROILIAC JOINT FUSION     SHOULDER SURGERY Left    SPINAL CORD STIMULATOR IMPLANT     Patient Active Problem List   Diagnosis Date Noted   Spinal cord stimulator status 03/03/2018   Major depressive disorder in partial remission ([place]) 03/03/2018   Ureteral calculus 68/04/2120   Renal colic 48/25/0037   Nephrolithiasis 08/28/2017   History of colon cancer 08/19/2017   OP (osteoporosis) 04/19/2017   Abdominal spasms 01/16/2016   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   Arthritis 05/18/2015   Hyperglycemia 02/16/2015   Anxiety disorder 10/04/2014   Allergic rhinitis 10/04/2014   Gastroesophageal reflux disease without esophagitis 10/04/2014   Insomnia 09/06/2014    PCP:   Lucio Edward  REFERRING PROVIDER: Bernardo Heater   REFERRING DIAG: R39.9 (ICD-10-CM) - Lower urinary tract  symptoms (LUTS)  Rationale for Evaluation and Treatment Rehabilitation  THERAPY DIAG:  Sacrococcygeal disorders, not elsewhere classified  Other abnormalities of gait and mobility  Other lack of coordination  ONSET DATE:  5-6 months ago    SUBJECTIVE:                                                                                                                                                                                           SUBJECTIVE STATEMENT: 1) difficulty with emptying urine:/ urge urgency  It takes 4-5 mins to complete urine, slow urine stream. Pt has urgency when he has  the urge to go. Pt has post-urine dribbling. Pt has to use a tissue to squeeze it out. Eventually, there is leakage after leaving the bathroom, enough to be smelly.    Hx of kidneys stones. Daily fluid intake:  6  floz water, 20 fl oz sweet tea, 10 fl oz coffee, 30 fl oz of soda,   2)  CLBP: Sore pain after playing golf and bowling 5/10 and takes Tylenol in the morning and it relinquishes. Fusion surgeries L4-S1/SIJ , wear spinal stimulator. Nothing stops him from his activities.   Fitness routine:  golfing and putt, does not walk on the course , bowling with 15 lbs               3)  acute rib pain on R: pt threw 50 lb of 3 bowling balls.  Pain now 3-4/10 on average, coughing 5/10.    PERTINENT HISTORY:  From his medical Hx chart:  Medical History  Nephrolithiasis  Multiple myeloma (H[facility])  Colon cancer ([facility])  Anxiety  Asthma  Acid reflux  Chronic back pain   History of chemotherapy  History of kidney stones  Hypothyroidism  Insomnia  Osteoporosis Allergic rhinitis  Digestive  Gastroesophageal reflux disease without esophagitis  Musculoskeletal and Integument  Arthritis   Medical History  con't  OP (osteoporosis)  Genitourinary  Ureteral calculus  Nephrolithiasis  Other  Insomnia  Anxiety disorder  Hyperglycemia  Smoldering multiple myeloma (SMM)  Abdominal spasms  History of colon cancer  Renal colic  Spinal cord stimulator status  Major depressive disorder in partial remission [facility])  Surgical History  Cystoscopy/ureteroscopy/holmium laser/stent placement (Right)  Colon surgery  Hernia repair  Back surgery  Colonoscopy  Eye surgery  Lumbar fusion  Sacroiliac joint fusion  Shoulder surgery (Left)  Spinal cord stimulator implant    PAIN:  Are you having pain?see above    PRECAUTIONS: None  WEIGHT BEARING RESTRICTIONS No  FALLS:  Has patient fallen in last 6 months? No  LIVING ENVIRONMENT: Lives with: lives  with their spouse Lives in:  House/apartment Stairs: Yes, basement, first floor, second floor.    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, hobbies:  golfing, bowling, watch TV, play arcade   PLOF: Independent  [place] again without problems   OBJECTIVE:    [facility] PT Assessment - 11/08/21 0938       Observation/Other Assessments   Observations slumped posture, ankles crossed      Coordination   Coordination and Movement Description chest breathing      AROM   Overall AROM Comments 55 cm digit II to floor in B sidebend, limited rotation B ~20% ,      Strength   Overall Strength Comments R hip flex 4/5,  knee flexion/ ext, R hip abd 3/5    , L 5/5      Palpation   SI assessment  L iliac crest higher, R shoulder lower      Ambulation/Gait   Gait Comments 1.38 m/s limited trunk rotation, limited posterior rotaiton of RUE              Pelvic Floor Special Questions - 11/08/21 0947     Diastasis Recti doming above longitudinal scar through umbilicus             [facility] Adult PT Treatment/Exercise - 11/08/21 2924       Therapeutic Activites    Therapeutic Activities Other Therapeutic Activities    Other Therapeutic Activities discussed bladder health and increasing water, decreasing bladder irritants ( see pt education for additional education)      Neuro Re-ed    Neuro Re-ed Details  cued forbody mechanics to minimnzie training pelvic floor ( see pt instruction)              HOME EXERCISE PROGRAM: See pt instruction section    ASSESSMENT:  CLINICAL IMPRESSION:   Pt is a  71  yo  who presents with difficulty with complete emptying of urine, urinary urgency,  CLBP, and acute R rib pain which impact QOL, ADL, fitness, and community activities.   Pt's musculoskeletal assessment revealed uneven pelvic girdle and shoulder height, asymmetries to gait pattern, limited spinal /pelvic mobility, dyscoordination and strength of pelvic floor mm, R hip weakness,  diastasis recti, abdominal scar restrictions, poor body mechanics which places strain on the abdominal/pelvic floor mm. These are deficits that indicate an ineffective intraabdominal pressure system associated with increased risk for pt's Sx.     Pt will benefit from coordination training and education on fitness and functional positions in order to gain a more effective intraabdominal pressure system to minimize urinary leakage and optimize spinal stability.    Pt was provided education on etiology of Sx with anatomy, physiology explanation with images along with the benefits of customized pelvic PT Tx based on pt's medical conditions and musculoskeletal deficits.  Explained the physiology of deep core mm coordination and roles of pelvic floor function in urination, defecation, sexual function, and postural control with deep core mm system.    Regional interdependent approaches will yield greater benefits in pt's POC.  Plan to address uneven pelvic / shoulder height and add stretches to complement his hobbies with golfing and bowling which involve asymmetric alignment of spine and pelvis.  Following Tx today which pt tolerated without complaints, pt demo'd equal alignment of pelvic girdle and increased spinal mobility.   Pt benefits from skilled PT.  OBJECTIVE IMPAIRMENTS decreased activity tolerance, decreased coordination, decreased endurance, decreased mobility, difficulty walking, decreased ROM, decreased strength, decreased safety awareness, hypomobility, increased muscle spasms, impaired flexibility, improper body mechanics, postural dysfunction, and pain. scar restrictions   ACTIVITY LIMITATIONS  self-care    PARTICIPATION LIMITATIONS:  community, gym activities    PERSONAL FACTORS   Hx of abdominal surgeries ( colon resection for colon CA) , low back surgery , acute R rib pain are also affecting patient's functional outcome.    REHAB POTENTIAL: Good   CLINICAL DECISION MAKING:  Evolving/moderate complexity   EVALUATION COMPLEXITY: Moderate    PATIENT EDUCATION:    Education details: Showed pt anatomy images. Explained muscles attachments/ connection, physiology of deep core system/ spinal- thoracic-pelvis-lower kinetic chain as they relate to pt's presentation, Sx, and past Hx. Explained what and how these areas of deficits need to be restored to balance and function    See Therapeutic activity / neuromuscular re-education section  Answered pt's questions.   Person educated: Patient Education method: Explanation, Demonstration, Tactile cues, Verbal cues, and Handouts Education comprehension: verbalized understanding, returned demonstration, verbal cues required, tactile cues required, and needs further education     PLAN: PT FREQUENCY: 1x/week   PT DURATION: 10 weeks   PLANNED INTERVENTIONS: Therapeutic exercises, Therapeutic activity, Neuromuscular re-education, Balance training, Gait training, Patient/Family education, Self Care, Joint mobilization, Spinal mobilization, Moist heat, Taping, and Manual therapy.   PLAN FOR NEXT SESSION: See clinical impression for plan     GOALS: Goals reviewed with patient? Yes  SHORT TERM GOALS: Target date: 12/06/2021    Pt will demo IND with HEP                    Baseline: Not IND            Goal status: INITIAL   LONG TERM GOALS: Target date: 01/17/2022    1.Pt will demo proper deep core coordination without chest breathing and optimal excursion of diaphragm/pelvic floor in order to promote spinal stability and pelvic floor function  Baseline: dyscoordination Goal status: INITIAL  2.  Pt will demo > 5 pt change on FOTO  to improve QOL and function   PFDI Urinary baseline -17 pts  Urinary Problem baseline- 48 pts  Lumber baseline  - 63 pts   Goal status: INITIAL  3.  Pt will demo proper body mechanics in against gravity tasks and ADLs  work tasks, fitness  to minimize straining pelvic floor /  back                  Baseline: not IND, improper form that places strain on pelvic floor                Goal status: INITIAL    4. Pt will increase hip strength on R to improve IAP system and pelvic floor function, stabilize spine for less LBP  Baseline:  R hip abd 3/5, R hip flex, knee, ext 4-/5  , L 5/5   Goal status: INITIAL    5. Pt will demo levelled pelvic girdle and shoulder height in order to progress to deep core strengthening HEP and restore mobility at spine, pelvis, gait, posture   Baseline: L iliac crest higher, R shoulder higher  Goal status: INITIAL   6. Pt will demo no more doming above longitidinal scar over abdomen to demo improved IAP management for spinal stability/ pelvic function  Baseline:  Goal status: INITIAL  7. Pt wil demo increased spinal sideflexion / rotation in order to optimize diaphragm expansion for deep core HEP progression    Baseline:55 cm digit II to floor in B sidebend                        Goal Status: Initial       Jerl Mina, PT 11/08/2021, 9:08 AM

## 2021-11-08 NOTE — Patient Instructions (Addendum)
   Decrease 10 fl oz of tea and sodas, increase 20 fl oz of water  --> 40 fl oz bladder irritant, 26 fl oz water   6  floz water, 20 fl oz sweet tea, 10 fl oz coffee, 30 fl oz of soda,   60 fl oz of bladder irritant, 6  fl oz of water   See the bladder irritant sheet  _________  Flavor water with honey, lemon, mint, cherries,  and eventually drinking plain water   __________  Avoid straining pelvic floor, abdominal muscles , spine  Use log rolling technique instead of getting out of bed with your neck or the sit-up     Log rolling into and out of bed   Log rolling into and out of bed If getting out of bed on R side, Bent knees, scoot hips/ shoulder to L  Raise R arm completely overhead, rolling onto armpit  Then lower bent knees to bed to get into complete side lying position  Then drop legs off bed, and push up onto R elbow/forearm, and use L hand to push onto the bed  __   Proper body mechanics with getting out of a chair to decrease strain  on back &pelvic floor   Avoid holding your breath when Getting out of the chair:  Scoot to front part of chair chair Heels behind knees, feet are hip width apart, nose over toes  Inhale like you are smelling roses Exhale to stand   __  Sitting with feet on ground, four points of contact Catch yourself crossing ankles and thighs  __

## 2021-11-15 ENCOUNTER — Ambulatory Visit: Payer: Medicare Other | Admitting: Physical Therapy

## 2021-11-15 DIAGNOSIS — M533 Sacrococcygeal disorders, not elsewhere classified: Secondary | ICD-10-CM

## 2021-11-15 DIAGNOSIS — M5459 Other low back pain: Secondary | ICD-10-CM

## 2021-11-15 DIAGNOSIS — R278 Other lack of coordination: Secondary | ICD-10-CM

## 2021-11-15 DIAGNOSIS — R2689 Other abnormalities of gait and mobility: Secondary | ICD-10-CM

## 2021-11-15 NOTE — Therapy (Signed)
OUTPATIENT PHYSICAL THERAPY Treatment    Patient Name: [name] MRN: [id number] DOB:1950/09/13, 71 y.o., male Today's Date: 11/16/2021   PT End of Session - 11/15/21 1602     Visit Number 2    Number of Visits 10    Date for PT Re-Evaluation 01/17/22    PT Start Time 1600    PT Stop Time 1640    PT Time Calculation (min) 40 min             Past Medical History:  Diagnosis Date   Abnormal laboratory test    Sees Dr. Ma Hillock   Acid reflux    Allergy bee venom   mainly as a child   Anxiety 12/08/1969   Arthritis    Asthma 12/08/1953   mainly as a child   Chronic back pain    Colon cancer ([place]) 11/1999   stage 3 s/p colon resection   Depression    H/O Clostridium difficile infection    History of chemotherapy    5-FU pump/leukovorin   History of kidney stones    Hypothyroidism    NO MEDS NOW   Insomnia    Multiple myeloma ([place]) 12/2011   smoldering vs mgus   Nephrolithiasis 08/28/2017   Osteoporosis    Seasonal allergies    Past Surgical History:  Procedure Laterality Date   BACK SURGERY     COLON SURGERY  2001   resection, 2nd surgery for scar tissue removal   COLONOSCOPY     COLONOSCOPY WITH PROPOFOL N/A 03/26/2016   Procedure: COLONOSCOPY WITH PROPOFOL;  Surgeon: Lollie Sails, MD;  Location: [facility] ENDOSCOPY;  Service: Endoscopy;  Laterality: N/A;   COLONOSCOPY WITH PROPOFOL N/A 06/16/2021   Procedure: COLONOSCOPY WITH PROPOFOL;  Surgeon: Jonathon Bellows, MD;  Location: [facility] ENDOSCOPY;  Service: Gastroenterology;  Laterality: N/A;   CYSTOSCOPY WITH URETEROSCOPY AND STENT PLACEMENT Left 02/21/2018   Procedure: URETEROSCOPY, LASER LITHOTRIPSY, STONE REMOVAL AND STENT PLACEMENT;  Surgeon: Abbie Sons, MD;  Location: [facility] ORS;  Service: Urology;  Laterality: Left;   CYSTOSCOPY/URETEROSCOPY/HOLMIUM LASER/STENT PLACEMENT Right 09/03/2017   Procedure: CYSTOSCOPY/URETEROSCOPY/HOLMIUM LASER/STENT PLACEMENT;  Surgeon: Abbie Sons, MD;  Location: [facility] ORS;   Service: Urology;  Laterality: Right;   ESOPHAGOGASTRODUODENOSCOPY  10/2010   EYE SURGERY  1994 and 2010   RKA in 94 and [place] in 2010   [place] N/A 07/06/2018   Procedure: FLEXIBLE SIGMOIDOSCOPY;  Surgeon: Toledo, Benay Pike, MD;  Location: [facility] ENDOSCOPY;  Service: Gastroenterology;  Laterality: N/A;   HERNIA REPAIR  1970   lower left   HOLMIUM LASER APPLICATION Left 02/21/5788   Procedure: HOLMIUM LASER APPLICATION;  Surgeon: Abbie Sons, MD;  Location: [facility] ORS;  Service: Urology;  Laterality: Left;   LUMBAR FUSION     L4-L5, L5-S1   SACROILIAC JOINT FUSION     SHOULDER SURGERY Left    SPINAL CORD STIMULATOR IMPLANT     Patient Active Problem List   Diagnosis Date Noted   Spinal cord stimulator status 03/03/2018   Major depressive disorder in partial remission ([place]) 03/03/2018   Ureteral calculus 38/33/3832   Renal colic 91/91/6606   Nephrolithiasis 08/28/2017   History of colon cancer 08/19/2017   OP (osteoporosis) 04/19/2017   Abdominal spasms 01/16/2016   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   Arthritis 05/18/2015   Hyperglycemia 02/16/2015   Anxiety disorder 10/04/2014   Allergic rhinitis 10/04/2014   Gastroesophageal reflux disease without esophagitis 10/04/2014   Insomnia 09/06/2014    PCP:  Lucio Edward  REFERRING PROVIDER: Bernardo Heater   REFERRING DIAG: R39.9 (ICD-10-CM) - Lower urinary tract symptoms (LUTS)  Rationale for Evaluation and Treatment Rehabilitation  THERAPY DIAG:  Sacrococcygeal disorders, not elsewhere classified  Other abnormalities of gait and mobility  Other lack of coordination  Other low back pain  ONSET DATE:  5-6 months ago    SUBJECTIVE:                                     Subjective today : 11/15/21:                                                                 Pt has cut back on his carbonated water.                                                                                                        SUBJECTIVE STATEMENT on EVAL 11/08/21  : 1) difficulty with emptying urine:/ urge urgency  It takes 4-5 mins to complete urine, slow urine stream. Pt has urgency when he has the urge to go. Pt has post-urine dribbling. Pt has to use a tissue to squeeze it out. Eventually, there is leakage after leaving the bathroom, enough to be smelly.    Hx of kidneys stones. Daily fluid intake:  6  floz water, 20 fl oz sweet tea, 10 fl oz coffee, 30 fl oz of soda,   2)  CLBP: Sore pain after playing golf and bowling 5/10 and takes Tylenol in the morning and it relinquishes. Fusion surgeries L4-S1/SIJ , wear spinal stimulator. Nothing stops him from his activities.   Fitness routine:  golfing and putt, does not walk on the course , bowling with 15 lbs               3)  acute rib pain on R: pt threw 50 lb of 3 bowling balls.  Pain now 3-4/10 on average, coughing 5/10.    PERTINENT HISTORY:  From his medical Hx chart:  Medical History  Nephrolithiasis  Multiple myeloma (HCC)  Colon cancer (HCC)  Anxiety  Asthma  Acid reflux  Chronic back pain   History of chemotherapy  History of kidney stones  Hypothyroidism  Insomnia  Osteoporosis Allergic rhinitis  Digestive  Gastroesophageal reflux disease without esophagitis  Musculoskeletal and Integument  Arthritis   Medical History  con't  OP (osteoporosis)  Genitourinary  Ureteral calculus  Nephrolithiasis  Other  Insomnia  Anxiety disorder  Hyperglycemia  Smoldering multiple myeloma (SMM)  Abdominal spasms  History of colon cancer  Renal colic  Spinal cord stimulator status  Major depressive disorder in partial remission [facility])  Surgical History  Cystoscopy/ureteroscopy/holmium laser/stent placement (Right)  Colon surgery  Hernia repair  Back surgery  Colonoscopy  Eye surgery  Lumbar fusion  Sacroiliac joint fusion  Shoulder surgery (Left)  Spinal cord stimulator implant    PAIN:  Are you having pain?see above     PRECAUTIONS: None  WEIGHT BEARING RESTRICTIONS No  FALLS:  Has patient fallen in last 6 months? No  LIVING ENVIRONMENT: Lives with: lives with their spouse Lives in: House/apartment Stairs: Yes, basement, first floor, second floor.    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, hobbies:  golfing, bowling, watch TV, play arcade   PLOF: Independent  [place] again without problems   OBJECTIVE:   [place] Adult PT Treatment/Exercise - 11/16/21 0910       Therapeutic Activites    Other Therapeutic Activities explained the importance of body  mechanics with log rolling instead of crunching to minimizxe hernias, straining pelvic floor, improving urinary Sx, minimize neck, back pain      Neuro Re-ed    Neuro Re-ed Details  cued for log rolling to L, cued for abdominal massage to minimize scar restrictions              HOME EXERCISE PROGRAM: See pt instruction section    ASSESSMENT:  CLINICAL IMPRESSION:   Pt showed good carry over with  equal alignment of pelvic girdle today after last session. Addressed longitudinal abdominal scar restrictions today to optimize fascial recoil of TrA to prepare pt 's progression to deep core coordination. Plan to apply manual Tx to promote anterolateral excursion of diaphragm at next session. Pt required cues for log rolling technique which will help minimize hernias/ straining to pelvic floor.     Pt benefits from skilled PT.    OBJECTIVE IMPAIRMENTS decreased activity tolerance, decreased coordination, decreased endurance, decreased mobility, difficulty walking, decreased ROM, decreased strength, decreased safety awareness, hypomobility, increased muscle spasms, impaired flexibility, improper body mechanics, postural dysfunction, and pain. scar restrictions   ACTIVITY LIMITATIONS  self-care    PARTICIPATION LIMITATIONS:  community, gym activities    PERSONAL FACTORS   Hx of abdominal surgeries ( colon  resection for colon CA) , low back surgery , acute R rib pain are also affecting patient's functional outcome.    REHAB POTENTIAL: Good   CLINICAL DECISION MAKING: Evolving/moderate complexity   EVALUATION COMPLEXITY: Moderate    PATIENT EDUCATION:    Education details: Showed pt anatomy images. Explained muscles attachments/ connection, physiology of deep core system/ spinal- thoracic-pelvis-lower kinetic chain as they relate to pt's presentation, Sx, and past Hx. Explained what and how these areas of deficits need to be restored to balance and function    See Therapeutic activity / neuromuscular re-education section  Answered pt's questions.   Person educated: Patient Education method: Explanation, Demonstration, Tactile cues, Verbal cues, and Handouts Education comprehension: verbalized understanding, returned demonstration, verbal cues required, tactile cues required, and needs further education     PLAN: PT FREQUENCY: 1x/week   PT DURATION: 10 weeks   PLANNED INTERVENTIONS: Therapeutic exercises, Therapeutic activity, Neuromuscular re-education, Balance training, Gait training, Patient/Family education, Self Care, Joint mobilization, Spinal mobilization, Moist heat, Taping, and Manual therapy.   PLAN FOR NEXT SESSION: See clinical impression for plan     GOALS: Goals reviewed with patient? Yes  SHORT TERM GOALS: Target date: 12/06/2021    Pt will demo IND with HEP                    Baseline: Not IND  Goal status: INITIAL   LONG TERM GOALS: Target date: 01/17/2022    1.Pt will demo proper deep core coordination without chest breathing and optimal excursion of diaphragm/pelvic floor in order to promote spinal stability and pelvic floor function  Baseline: dyscoordination Goal status: INITIAL  2.  Pt will demo > 5 pt change on FOTO  to improve QOL and function   PFDI Urinary baseline -17 pts  Urinary Problem baseline- 48 pts  Lumber baseline  -  63 pts   Goal status: INITIAL  3.  Pt will demo proper body mechanics in against gravity tasks and ADLs  work tasks, fitness  to minimize straining pelvic floor / back                  Baseline: not IND, improper form that places strain on pelvic floor                Goal status: INITIAL    4. Pt will increase hip strength on R to improve IAP system and pelvic floor function, stabilize spine for less LBP  Baseline:  R hip abd 3/5, R hip flex, knee, ext 4-/5  , L 5/5   Goal status: INITIAL    5. Pt will demo levelled pelvic girdle and shoulder height in order to progress to deep core strengthening HEP and restore mobility at spine, pelvis, gait, posture   Baseline: L iliac crest higher, R shoulder higher  Goal status: INITIAL   6. Pt will demo no more doming above longitidinal scar over abdomen to demo improved IAP management for spinal stability/ pelvic function  Baseline:  Goal status: INITIAL                           7. Pt wil demo increased spinal sideflexion / rotation in order to optimize diaphragm expansion for deep core HEP progression    Baseline:55 cm digit II to floor in B sidebend                        Goal Status: Initial       Jerl Mina, PT 11/16/2021, 9:22 AM

## 2021-11-15 NOTE — Patient Instructions (Signed)
Abdominal massage upward from L,  R , center to belly button 3 stroke x 3 , pressure is gentle and light with all fingers flat, not using finger tips

## 2021-11-22 ENCOUNTER — Ambulatory Visit: Payer: Medicare Other | Admitting: Physical Therapy

## 2021-11-22 DIAGNOSIS — R2689 Other abnormalities of gait and mobility: Secondary | ICD-10-CM

## 2021-11-22 DIAGNOSIS — M5459 Other low back pain: Secondary | ICD-10-CM

## 2021-11-22 DIAGNOSIS — M533 Sacrococcygeal disorders, not elsewhere classified: Secondary | ICD-10-CM | POA: Diagnosis not present

## 2021-11-22 DIAGNOSIS — R278 Other lack of coordination: Secondary | ICD-10-CM

## 2021-11-22 NOTE — Therapy (Addendum)
OUTPATIENT PHYSICAL THERAPY Treatment    Patient Name: ISAACK PREBLE MRN: [id number] DOB:03-12-1950, 71 y.o., male Today's Date: 11/22/2021   PT End of Session - 11/22/21 1019     Visit Number 3    Number of Visits 10    Date for PT Re-Evaluation 01/17/22    PT Start Time 1017    PT Stop Time 1100    PT Time Calculation (min) 43 min    Activity Tolerance Patient tolerated treatment well;No increased pain    Behavior During Therapy [facility] for tasks assessed/performed             Past Medical History:  Diagnosis Date   Abnormal laboratory test    Sees Dr. Ma Hillock   Acid reflux    Allergy bee venom   mainly as a child   Anxiety 12/08/1969   Arthritis    Asthma 12/08/1953   mainly as a child   Chronic back pain    Colon cancer ([place]) 11/1999   stage 3 s/p colon resection   Depression    H/O Clostridium difficile infection    History of chemotherapy    5-FU pump/leukovorin   History of kidney stones    Hypothyroidism    NO MEDS NOW   Insomnia    Multiple myeloma ([place]) 12/2011   smoldering vs mgus   Nephrolithiasis 08/28/2017   Osteoporosis    Seasonal allergies    Past Surgical History:  Procedure Laterality Date   BACK SURGERY     COLON SURGERY  2001   resection, 2nd surgery for scar tissue removal   COLONOSCOPY     COLONOSCOPY WITH PROPOFOL N/A 03/26/2016   Procedure: COLONOSCOPY WITH PROPOFOL;  Surgeon: Lollie Sails, MD;  Location[facility] ENDOSCOPY;  Service: Endoscopy;  Laterality: N/A;   COLONOSCOPY WITH PROPOFOL N/A 06/16/2021   Procedure: COLONOSCOPY WITH PROPOFOL;  Surgeon: Jonathon Bellows, MD;  Location: [facility] ENDOSCOPY;  Service: Gastroenterology;  Laterality: N/A;   CYSTOSCOPY WITH URETEROSCOPY AND STENT PLACEMENT Left 02/21/2018   Procedure: URETEROSCOPY, LASER LITHOTRIPSY, STONE REMOVAL AND STENT PLACEMENT;  Surgeon: Abbie Sons, MD;  Location: [facility] ORS;  Service: Urology;  Laterality: Left;   CYSTOSCOPY/URETEROSCOPY/HOLMIUM LASER/STENT PLACEMENT  Right 09/03/2017   Procedure: CYSTOSCOPY/URETEROSCOPY/HOLMIUM LASER/STENT PLACEMENT;  Surgeon: Abbie Sons, MD;  Location: [facility] ORS;  Service: Urology;  Laterality: Right;   ESOPHAGOGASTRODUODENOSCOPY  10/2010   EYE SURGERY  1994 and 2010   RKA in 94 and [place] in 2010   [place] N/A 07/06/2018   Procedure: FLEXIBLE SIGMOIDOSCOPY;  Surgeon: Toledo, Benay Pike, MD;  Location: [facility] ENDOSCOPY;  Service: Gastroenterology;  Laterality: N/A;   HERNIA REPAIR  1970   lower left   HOLMIUM LASER APPLICATION Left 3/81/0175   Procedure: HOLMIUM LASER APPLICATION;  Surgeon: Abbie Sons, MD;  Location: [facility] ORS;  Service: Urology;  Laterality: Left;   LUMBAR FUSION     L4-L5, L5-S1   SACROILIAC JOINT FUSION     SHOULDER SURGERY Left    SPINAL CORD STIMULATOR IMPLANT     Patient Active Problem List   Diagnosis Date Noted   Spinal cord stimulator status 03/03/2018   Major depressive disorder in partial remission ([place]) 03/03/2018   Ureteral calculus 11/29/8525   Renal colic 78/24/2353   Nephrolithiasis 08/28/2017   History of colon cancer 08/19/2017   OP (osteoporosis) 04/19/2017   Abdominal spasms 01/16/2016   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   Arthritis 05/18/2015   Hyperglycemia 02/16/2015   Anxiety disorder 10/04/2014  Allergic rhinitis 10/04/2014   Gastroesophageal reflux disease without esophagitis 10/04/2014   Insomnia 09/06/2014    PCP:   Lucio Edward  REFERRING PROVIDER: Bernardo Heater   REFERRING DIAG: R39.9 (ICD-10-CM) - Lower urinary tract symptoms (LUTS)  Rationale for Evaluation and Treatment Rehabilitation  THERAPY DIAG:  Sacrococcygeal disorders, not elsewhere classified  Other abnormalities of gait and mobility  Other lack of coordination  Other low back pain  ONSET DATE:  5-6 months ago    SUBJECTIVE:                                     Subjective today : 11/22/21:                                                                 Pt has  nausea this morning which is common across 3 mornings out of a week.  Pt feels a little bit of dizziness but it is more a stomach thing.  Pt averages 2 x to get up to pee.   Pt sleeps on 3 pillows                                                                                         SUBJECTIVE STATEMENT on EVAL 11/08/21  : 1) difficulty with emptying urine:/ urge urgency  It takes 4-5 mins to complete urine, slow urine stream. Pt has urgency when he has the urge to go. Pt has post-urine dribbling. Pt has to use a tissue to squeeze it out. Eventually, there is leakage after leaving the bathroom, enough to be smelly.    Hx of kidneys stones. Daily fluid intake:  6  floz water, 20 fl oz sweet tea, 10 fl oz coffee, 30 fl oz of soda,   2)  CLBP: Sore pain after playing golf and bowling 5/10 and takes Tylenol in the morning and it relinquishes. Fusion surgeries L4-S1/SIJ , wear spinal stimulator. Nothing stops him from his activities.   Fitness routine:  golfing and putt, does not walk on the course , bowling with 15 lbs               3)  acute rib pain on R: pt threw 50 lb of 3 bowling balls.  Pain now 3-4/10 on average, coughing 5/10.    PERTINENT HISTORY:  From his medical Hx chart:  Medical History  Nephrolithiasis  Multiple myeloma (HCC)  Colon cancer (HCC)  Anxiety  Asthma  Acid reflux  Chronic back pain   History of chemotherapy  History of kidney stones  Hypothyroidism  Insomnia  Osteoporosis Allergic rhinitis  Digestive  Gastroesophageal reflux disease without esophagitis  Musculoskeletal and Integument  Arthritis   Medical History  con't  OP (osteoporosis)  Genitourinary  Ureteral calculus  Nephrolithiasis  Other  Insomnia  Anxiety disorder  Hyperglycemia  Smoldering multiple myeloma (SMM)  Abdominal spasms  History of colon cancer  Renal colic  Spinal cord stimulator status  Major depressive disorder in partial remission [facility])  Surgical History   Cystoscopy/ureteroscopy/holmium laser/stent placement (Right)  Colon surgery  Hernia repair  Back surgery  Colonoscopy  Eye surgery  Lumbar fusion  Sacroiliac joint fusion  Shoulder surgery (Left)  Spinal cord stimulator implant    PAIN:  Are you having pain?see above    PRECAUTIONS: None  WEIGHT BEARING RESTRICTIONS No  FALLS:  Has patient fallen in last 6 months? No  LIVING ENVIRONMENT: Lives with: lives with their spouse Lives in: House/apartment Stairs: Yes, basement, first floor, second floor.    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, hobbies:  golfing, bowling, watch TV, play arcade   PLOF: Independent  [place] again without problems   OBJECTIVE:    [facility] PT Assessment - 11/22/21 1053       Coordination   Coordination and Movement Description limited anterior excursion of diaphragm B      Palpation   Palpation comment tightness along intercostals, paraspinals B ,             [facility] Adult PT Treatment/Exercise - 11/22/21 1052       Neuro Re-ed    Neuro Re-ed Details  cued for thoracic mobility HEP      Manual Therapy   Manual therapy comments STM/MWM at thoracic region to promote anterior excursion of diaphragm               HOME EXERCISE PROGRAM: See pt instruction section    ASSESSMENT:  CLINICAL IMPRESSION:   Pt demo'd improved anterior/ lateral excursion of diaphragm post Tx which included manual Tx.  Anticipate thoracic mobility will help optimize IAP system to help improve urinary and postural stability. Pt required cues for segmental rotation of spine.   Plan to add cervical/ scapular strengthening to improve forward  head and thoracic kyphosis which will yield greater outcomes with IAP system for urinary issues and CLBP.  Pt benefits from skilled PT.    OBJECTIVE IMPAIRMENTS decreased activity tolerance, decreased coordination, decreased endurance, decreased mobility, difficulty walking,  decreased ROM, decreased strength, decreased safety awareness, hypomobility, increased muscle spasms, impaired flexibility, improper body mechanics, postural dysfunction, and pain. scar restrictions   ACTIVITY LIMITATIONS  self-care    PARTICIPATION LIMITATIONS:  community, gym activities    PERSONAL FACTORS   Hx of abdominal surgeries ( colon resection for colon CA) , low back surgery , acute R rib pain are also affecting patient's functional outcome.    REHAB POTENTIAL: Good   CLINICAL DECISION MAKING: Evolving/moderate complexity   EVALUATION COMPLEXITY: Moderate    PATIENT EDUCATION:    Education details: Showed pt anatomy images. Explained muscles attachments/ connection, physiology of deep core system/ spinal- thoracic-pelvis-lower kinetic chain as they relate to pt's presentation, Sx, and past Hx. Explained what and how these areas of deficits need to be restored to balance and function    See Therapeutic activity / neuromuscular re-education section  Answered pt's questions.   Person educated: Patient Education method: Explanation, Demonstration, Tactile cues, Verbal cues, and Handouts Education comprehension: verbalized understanding, returned demonstration, verbal cues required, tactile cues required, and needs further education     PLAN: PT FREQUENCY: 1x/week   PT DURATION: 10 weeks   PLANNED INTERVENTIONS: Therapeutic exercises, Therapeutic activity, Neuromuscular re-education, Balance training, Gait training, Patient/Family education,  Self Care, Joint mobilization, Spinal mobilization, Moist heat, Taping, and Manual therapy.   PLAN FOR NEXT SESSION: See clinical impression for plan     GOALS: Goals reviewed with patient? Yes  SHORT TERM GOALS: Target date: 12/06/2021    Pt will demo IND with HEP                    Baseline: Not IND            Goal status: INITIAL   LONG TERM GOALS: Target date: 01/17/2022    1.Pt will demo proper deep core  coordination without chest breathing and optimal excursion of diaphragm/pelvic floor in order to promote spinal stability and pelvic floor function  Baseline: dyscoordination Goal status: INITIAL  2.  Pt will demo > 5 pt change on FOTO  to improve QOL and function   PFDI Urinary baseline -17 pts  Urinary Problem baseline- 48 pts  Lumber baseline  - 63 pts   Goal status: INITIAL  3.  Pt will demo proper body mechanics in against gravity tasks and ADLs  work tasks, fitness  to minimize straining pelvic floor / back                  Baseline: not IND, improper form that places strain on pelvic floor                Goal status: INITIAL    4. Pt will increase hip strength on R to improve IAP system and pelvic floor function, stabilize spine for less LBP  Baseline:  R hip abd 3/5, R hip flex, knee, ext 4-/5  , L 5/5   Goal status: INITIAL    5. Pt will demo levelled pelvic girdle and shoulder height in order to progress to deep core strengthening HEP and restore mobility at spine, pelvis, gait, posture   Baseline: L iliac crest higher, R shoulder higher  Goal status: INITIAL   6. Pt will demo no more doming above longitidinal scar over abdomen to demo improved IAP management for spinal stability/ pelvic function  Baseline:  Goal status: INITIAL  7. Pt wil demo increased spinal sideflexion / rotation in order to optimize diaphragm expansion for deep core HEP progression    Baseline:55 cm digit II to floor in B sidebend                        Goal Status: Initial       Jerl Mina, PT 11/22/2021, 10:19 AM

## 2021-11-22 NOTE — Patient Instructions (Signed)
  Lengthen Back rib by L  shoulder ( winging)    Lie on R  side , pillow between knees and under head  Pull  L arm overhead over mattress, grab the edge of mattress,pull it upward, drawing elbow away from ears  Breathing 10 reps  Brushing arm with 3/4 turn onto pillow behind back  Lying on R  side ,Pillow/ Block between knees     dragging top forearm across ribs below breast rotating 3/4 turn,  rotating  _L_ only this week ,  relax onto the pillow behind the back  and then back to other palm , maintain top palm on body whole top and not lift shoulder  ___  Lengthen Back rib by R  shoulder   ( winging)    Lie on L  side , pillow between knees and under head  Pull  R arm overhead over mattress, grab the edge of mattress,pull it upward, drawing elbow away from ears  Breathing 10 reps  Brushing arm with 3/4 turn onto pillow behind back  Lying on L  side ,Pillow/ Block between knees     dragging top forearm across ribs below breast rotating 3/4 turn,  rotating  _R_ only this week ,  relax onto the pillow behind the back  and then back to other palm , maintain top palm on body whole top and not lift shoulder  

## 2021-11-29 ENCOUNTER — Ambulatory Visit: Payer: Medicare Other | Admitting: Physical Therapy

## 2021-11-29 DIAGNOSIS — M533 Sacrococcygeal disorders, not elsewhere classified: Secondary | ICD-10-CM | POA: Diagnosis not present

## 2021-11-29 DIAGNOSIS — M5459 Other low back pain: Secondary | ICD-10-CM

## 2021-11-29 DIAGNOSIS — R278 Other lack of coordination: Secondary | ICD-10-CM

## 2021-11-29 DIAGNOSIS — R2689 Other abnormalities of gait and mobility: Secondary | ICD-10-CM

## 2021-11-29 NOTE — Therapy (Signed)
OUTPATIENT PHYSICAL THERAPY Treatment    Patient Name: [name] MRN: [id number] DOB:1951-01-08, 71 y.o., male Today's Date: 11/29/2021   PT End of Session - 11/29/21 1022     Visit Number 4    Number of Visits 10    Date for PT Re-Evaluation 01/17/22    PT Start Time 1020    PT Stop Time 1100    PT Time Calculation (min) 40 min    Activity Tolerance Patient tolerated treatment well;No increased pain    Behavior During Therapy [facility] for tasks assessed/performed             Past Medical History:  Diagnosis Date   Abnormal laboratory test    Sees Dr. Ma Hillock   Acid reflux    Allergy bee venom   mainly as a child   Anxiety 12/08/1969   Arthritis    Asthma 12/08/1953   mainly as a child   Chronic back pain    Colon cancer ([place]) 11/1999   stage 3 s/p colon resection   Depression    H/O Clostridium difficile infection    History of chemotherapy    5-FU pump/leukovorin   History of kidney stones    Hypothyroidism    NO MEDS NOW   Insomnia    Multiple myeloma ([place]) 12/2011   smoldering vs mgus   Nephrolithiasis 08/28/2017   Osteoporosis    Seasonal allergies    Past Surgical History:  Procedure Laterality Date   BACK SURGERY     COLON SURGERY  2001   resection, 2nd surgery for scar tissue removal   COLONOSCOPY     COLONOSCOPY WITH PROPOFOL N/A 03/26/2016   Procedure: COLONOSCOPY WITH PROPOFOL;  Surgeon: Lollie Sails, MD;  Location: [facility] ENDOSCOPY;  Service: Endoscopy;  Laterality: N/A;   COLONOSCOPY WITH PROPOFOL N/A 06/16/2021   Procedure: COLONOSCOPY WITH PROPOFOL;  Surgeon: Jonathon Bellows, MD;  Location: [facility] ENDOSCOPY;  Service: Gastroenterology;  Laterality: N/A;   CYSTOSCOPY WITH URETEROSCOPY AND STENT PLACEMENT Left 02/21/2018   Procedure: URETEROSCOPY, LASER LITHOTRIPSY, STONE REMOVAL AND STENT PLACEMENT;  Surgeon: Abbie Sons, MD;  Location: [facility] ORS;  Service: Urology;  Laterality: Left;   CYSTOSCOPY/URETEROSCOPY/HOLMIUM LASER/STENT PLACEMENT  Right 09/03/2017   Procedure: CYSTOSCOPY/URETEROSCOPY/HOLMIUM LASER/STENT PLACEMENT;  Surgeon: Abbie Sons, MD;  Location: [facility] ORS;  Service: Urology;  Laterality: Right;   ESOPHAGOGASTRODUODENOSCOPY  10/2010   EYE SURGERY  1994 and 2010   RKA in 94 and [place] in 2010   [place] N/A 07/06/2018   Procedure: FLEXIBLE SIGMOIDOSCOPY;  Surgeon: Toledo, Benay Pike, MD;  Location: [facility] ENDOSCOPY;  Service: Gastroenterology;  Laterality: N/A;   HERNIA REPAIR  1970   lower left   HOLMIUM LASER APPLICATION Left 03/19/2480   Procedure: HOLMIUM LASER APPLICATION;  Surgeon: Abbie Sons, MD;  Location: [facility] ORS;  Service: Urology;  Laterality: Left;   LUMBAR FUSION     L4-L5, L5-S1   SACROILIAC JOINT FUSION     SHOULDER SURGERY Left    SPINAL CORD STIMULATOR IMPLANT     Patient Active Problem List   Diagnosis Date Noted   Spinal cord stimulator status 03/03/2018   Major depressive disorder in partial remission ([place]) 03/03/2018   Ureteral calculus 50/04/7046   Renal colic 88/91/6945   Nephrolithiasis 08/28/2017   History of colon cancer 08/19/2017   OP (osteoporosis) 04/19/2017   Abdominal spasms 01/16/2016   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   Arthritis 05/18/2015   Hyperglycemia 02/16/2015   Anxiety disorder 10/04/2014  Allergic rhinitis 10/04/2014   Gastroesophageal reflux disease without esophagitis 10/04/2014   Insomnia 09/06/2014    PCP:   Lucio Edward  REFERRING PROVIDER: Bernardo Heater   REFERRING DIAG: R39.9 (ICD-10-CM) - Lower urinary tract symptoms (LUTS)  Rationale for Evaluation and Treatment Rehabilitation  THERAPY DIAG:  Sacrococcygeal disorders, not elsewhere classified  Other abnormalities of gait and mobility  Other lack of coordination  Other low back pain  ONSET DATE:  5-6 months ago    SUBJECTIVE:                                     Subjective today : 10/25/ 23:                                                                 Pt is  sleeping with pillows stacked in the way he learned last session. R rib pain is better, slowly going away . Pt still takes mm relaxer.                                                                                    SUBJECTIVE STATEMENT on EVAL 11/08/21  : 1) difficulty with emptying urine:/ urge urgency  It takes 4-5 mins to complete urine, slow urine stream. Pt has urgency when he has the urge to go. Pt has post-urine dribbling. Pt has to use a tissue to squeeze it out. Eventually, there is leakage after leaving the bathroom, enough to be smelly.    Hx of kidneys stones. Daily fluid intake:  6  floz water, 20 fl oz sweet tea, 10 fl oz coffee, 30 fl oz of soda,   2)  CLBP: Sore pain after playing golf and bowling 5/10 and takes Tylenol in the morning and it relinquishes. Fusion surgeries L4-S1/SIJ , wear spinal stimulator. Nothing stops him from his activities.   Fitness routine:  golfing and putt, does not walk on the course , bowling with 15 lbs               3)  acute rib pain on R: pt threw 50 lb of 3 bowling balls.  Pain now 3-4/10 on average, coughing 5/10.    PERTINENT HISTORY:  From his medical Hx chart:  Medical History  Nephrolithiasis  Multiple myeloma (HCC)  Colon cancer ([place])  Anxiety  Asthma  Acid reflux  Chronic back pain   History of chemotherapy  History of kidney stones  Hypothyroidism  Insomnia  Osteoporosis Allergic rhinitis  Digestive  Gastroesophageal reflux disease without esophagitis  Musculoskeletal and Integument  Arthritis   Medical History  con't  OP (osteoporosis)  Genitourinary  Ureteral calculus  Nephrolithiasis  Other  Insomnia  Anxiety disorder  Hyperglycemia  Smoldering multiple myeloma (SMM)  Abdominal spasms  History of colon cancer  Renal colic  Spinal cord stimulator status  Major depressive disorder in partial remission ([place])  Surgical History  Cystoscopy/ureteroscopy/holmium laser/stent placement (Right)  Colon surgery   Hernia repair  Back surgery  Colonoscopy  Eye surgery  Lumbar fusion  Sacroiliac joint fusion  Shoulder surgery (Left)  Spinal cord stimulator implant    PAIN:  Are you having pain?see above    PRECAUTIONS: None  WEIGHT BEARING RESTRICTIONS No  FALLS:  Has patient fallen in last 6 months? No  LIVING ENVIRONMENT: Lives with: lives with their spouse Lives in: House/apartment Stairs: Yes, basement, first floor, second floor.    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 thoracic kyphosis,      Palpation   SI assessment  L pelvic girdle, R shoulder higher,    Palpation comment abdominal scar restrictions, more signficant with umbilicus             [facility] Adult PT Treatment/Exercise - 11/29/21 1026       Neuro Re-ed    Neuro Re-ed Details  cued for deep core , diaphramgatic exxcursion and less abdominal straining, cued for  segmental movement with supine reclined twist with less breathholding.      Modalities   Modalities Moist Heat      Moist Heat Therapy   Number Minutes Moist Heat 5 Minutes    Moist Heat Location --   abdomen during instruction with deep core level 1     Manual Therapy   Manual therapy comments fascial mobilization along abdominal scar to promote more recoil TrA and deep core               HOME EXERCISE PROGRAM: See pt instruction section     ASSESSMENT:  CLINICAL IMPRESSION:   Pt showed more upright posture. Continued to address thoracic mobility will help optimize IAP system to help improve urinary and postural stability with propioception and cues for segmental awareness of trunk/ pelvis.  Applied manual Tx to minimize abdominal scar restrictions to improve elastic recoil of TrA which will help with deep core. Pt advanced to deep core level 1 with cues for less abdominal straining.    Plan to add deep core level 2, cervical/ scapular strengthening, and complimentary stretches to counteract torsion in repeated pattern with his hobbies ( golfing and bowling).      Anticipate these upcoming strategies will yield greater outcomes with IAP system for urinary issues and CLBP. Pt remains compliant with HEP.   Pt benefits from skilled PT.    OBJECTIVE IMPAIRMENTS decreased activity tolerance, decreased coordination, decreased endurance, decreased mobility, difficulty walking, decreased ROM, decreased strength, decreased safety awareness, hypomobility, increased muscle spasms, impaired flexibility, improper body mechanics, postural dysfunction, and pain. scar restrictions   ACTIVITY LIMITATIONS  self-care    PARTICIPATION LIMITATIONS:  community, gym activities    [place]  Hx of abdominal surgeries ( colon resection for colon CA) , low back surgery , acute R rib pain are also affecting patient's functional outcome.    REHAB POTENTIAL: Good   CLINICAL DECISION MAKING: Evolving/moderate complexity   EVALUATION COMPLEXITY: Moderate    PATIENT EDUCATION:    Education details: Showed pt anatomy images. Explained muscles attachments/ connection, physiology of deep core system/ spinal- thoracic-pelvis-lower kinetic chain as they relate to pt's presentation, Sx, and past Hx. Explained what and how these areas of deficits need to be restored to balance and function    See Therapeutic activity / neuromuscular re-education  section  Answered pt's questions.   Person educated: Patient Education method: Explanation, Demonstration, Tactile cues, Verbal cues, and Handouts Education comprehension: verbalized understanding, returned demonstration, verbal cues required, tactile cues required, and needs further education     PLAN: PT FREQUENCY: 1x/week   PT DURATION: 10 weeks   PLANNED INTERVENTIONS: Therapeutic exercises, Therapeutic activity, Neuromuscular re-education, Balance training, Gait training, Patient/Family education, Self Care, Joint mobilization, Spinal mobilization, Moist heat, Taping, and Manual therapy.   PLAN FOR NEXT SESSION: See clinical impression for plan     GOALS: Goals reviewed with patient? Yes  SHORT TERM GOALS: Target date: 12/06/2021    Pt will demo IND with HEP                    Baseline: Not IND            Goal status: INITIAL   LONG TERM GOALS: Target date: 01/17/2022    1.Pt will demo proper deep core coordination without chest breathing and optimal excursion of diaphragm/pelvic floor in order to promote spinal stability and pelvic floor function  Baseline: dyscoordination Goal status: INITIAL  2.  Pt will demo > 5 pt change on FOTO  to improve QOL and function   PFDI Urinary baseline -17 pts  Urinary Problem baseline- 48 pts  Lumber baseline  - 63 pts   Goal status: INITIAL  3.  Pt will demo proper body mechanics in against gravity tasks and ADLs  work tasks, fitness  to minimize straining pelvic floor / back                  Baseline: not IND, improper form that places strain on pelvic floor                Goal status: INITIAL    4. Pt will increase hip strength on R to improve IAP system and pelvic floor function, stabilize spine for less LBP  Baseline:  R hip abd 3/5, R hip flex, knee, ext 4-/5  , L 5/5   Goal status: INITIAL    5. Pt will demo levelled pelvic girdle and shoulder height in order to progress to deep core strengthening HEP and  restore mobility at spine, pelvis, gait, posture   Baseline: L iliac crest higher, R shoulder higher  Goal status: INITIAL   6. Pt will demo no more doming above longitidinal scar over abdomen to demo improved IAP management for spinal stability/ pelvic function  Baseline:  Goal status: INITIAL  7. Pt wil demo increased spinal sideflexion / rotation in order to optimize diaphragm expansion for deep core HEP progression    Baseline:55 cm digit II to floor in B sidebend                        Goal Status: Initial  Jerl Mina, PT 11/29/2021, 10:23 AM

## 2021-11-29 NOTE — Patient Instructions (Addendum)
    Reclined twist for hips and side of the hips/ legs: rocking knees from midline to side 45 deg   Lay on your back, knees bend digging elbows and feet into bed, exhale to lift buttocks and scoot over to R,   leave shoulders in place Wobble knees to the L side 45 deg from midline and back  - 10 reps   Repeat other side , scoot hips to L, knees wobble to R   __  Deep core level 1 - 10 reps   ( handout)   ____    Neck / shoulder stretches:    Lying on back -  _ 6 directions  5 reps  _

## 2021-12-06 ENCOUNTER — Ambulatory Visit: Payer: Medicare Other | Admitting: Physical Therapy

## 2021-12-13 ENCOUNTER — Ambulatory Visit: Payer: Medicare Other | Admitting: Physical Therapy

## 2021-12-20 ENCOUNTER — Encounter: Payer: Medicare Other | Admitting: Physical Therapy

## 2022-02-12 ENCOUNTER — Inpatient Hospital Stay: Payer: Medicare Other | Attending: Internal Medicine

## 2022-02-12 DIAGNOSIS — D472 Monoclonal gammopathy: Secondary | ICD-10-CM | POA: Diagnosis present

## 2022-02-12 LAB — CBC WITH DIFFERENTIAL/PLATELET
Abs Immature Granulocytes: 0.02 10*3/uL (ref 0.00–0.07)
Basophils Absolute: 0 10*3/uL (ref 0.0–0.1)
Basophils Relative: 0 %
Eosinophils Absolute: 0.2 10*3/uL (ref 0.0–0.5)
Eosinophils Relative: 3 %
HCT: 44 % (ref 39.0–52.0)
Hemoglobin: 15.4 g/dL (ref 13.0–17.0)
Immature Granulocytes: 0 %
Lymphocytes Relative: 36 %
Lymphs Abs: 2.5 10*3/uL (ref 0.7–4.0)
MCH: 32.8 pg (ref 26.0–34.0)
MCHC: 35 g/dL (ref 30.0–36.0)
MCV: 93.8 fL (ref 80.0–100.0)
Monocytes Absolute: 0.6 10*3/uL (ref 0.1–1.0)
Monocytes Relative: 8 %
Neutro Abs: 3.6 10*3/uL (ref 1.7–7.7)
Neutrophils Relative %: 53 %
Platelets: 373 10*3/uL (ref 150–400)
RBC: 4.69 MIL/uL (ref 4.22–5.81)
RDW: 11.8 % (ref 11.5–15.5)
WBC: 6.9 10*3/uL (ref 4.0–10.5)
nRBC: 0 % (ref 0.0–0.2)

## 2022-02-12 LAB — COMPREHENSIVE METABOLIC PANEL
ALT: 19 U/L (ref 0–44)
AST: 23 U/L (ref 15–41)
Albumin: 4.3 g/dL (ref 3.5–5.0)
Alkaline Phosphatase: 71 U/L (ref 38–126)
Anion gap: 7 (ref 5–15)
BUN: 17 mg/dL (ref 8–23)
CO2: 29 mmol/L (ref 22–32)
Calcium: 9.5 mg/dL (ref 8.9–10.3)
Chloride: 102 mmol/L (ref 98–111)
Creatinine, Ser: 1.4 mg/dL — ABNORMAL HIGH (ref 0.61–1.24)
GFR, Estimated: 53 mL/min — ABNORMAL LOW (ref >60)
Glucose, Bld: 107 mg/dL — ABNORMAL HIGH (ref 70–99)
Potassium: 4.3 mmol/L (ref 3.5–5.1)
Sodium: 138 mmol/L (ref 135–145)
Total Bilirubin: 0.5 mg/dL (ref 0.3–1.2)
Total Protein: 6.9 g/dL (ref 6.5–8.1)

## 2022-02-13 LAB — KAPPA/LAMBDA LIGHT CHAINS
Kappa free light chain: 102.2 mg/L — ABNORMAL HIGH (ref 3.3–19.4)
Kappa, lambda light chain ratio: 6.51 — ABNORMAL HIGH (ref 0.26–1.65)
Lambda free light chains: 15.7 mg/L (ref 5.7–26.3)

## 2022-02-15 LAB — MULTIPLE MYELOMA PANEL, SERUM
Albumin SerPl Elph-Mcnc: 4.3 g/dL (ref 2.9–4.4)
Albumin/Glob SerPl: 1.8 — ABNORMAL HIGH (ref 0.7–1.7)
Alpha 1: 0.2 g/dL (ref 0.0–0.4)
Alpha2 Glob SerPl Elph-Mcnc: 0.6 g/dL (ref 0.4–1.0)
B-Globulin SerPl Elph-Mcnc: 0.9 g/dL (ref 0.7–1.3)
Gamma Glob SerPl Elph-Mcnc: 0.8 g/dL (ref 0.4–1.8)
Globulin, Total: 2.5 g/dL (ref 2.2–3.9)
IgA: 356 mg/dL (ref 61–437)
IgG (Immunoglobin G), Serum: 756 mg/dL (ref 603–1613)
IgM (Immunoglobulin M), Srm: 51 mg/dL (ref 15–143)
M Protein SerPl Elph-Mcnc: 0.3 g/dL — ABNORMAL HIGH
Total Protein ELP: 6.8 g/dL (ref 6.0–8.5)

## 2022-02-16 ENCOUNTER — Other Ambulatory Visit: Payer: Self-pay | Admitting: *Deleted

## 2022-02-16 DIAGNOSIS — D472 Monoclonal gammopathy: Secondary | ICD-10-CM

## 2022-02-19 ENCOUNTER — Encounter: Payer: Self-pay | Admitting: Internal Medicine

## 2022-02-19 ENCOUNTER — Inpatient Hospital Stay (HOSPITAL_BASED_OUTPATIENT_CLINIC_OR_DEPARTMENT_OTHER): Payer: Medicare Other | Admitting: Internal Medicine

## 2022-02-19 VITALS — BP 150/80 | HR 50 | Temp 96.3°F | Resp 16 | Wt 138.8 lb

## 2022-02-19 DIAGNOSIS — D472 Monoclonal gammopathy: Secondary | ICD-10-CM

## 2022-02-19 NOTE — Progress Notes (Signed)
[facility] [place] OFFICE PROGRESS NOTE  Patient Care Team: Delsa Grana, PA-C as PCP - General (Family Medicine) Cammie Sickle, MD as Consulting Physician (Internal Medicine)   Cancer Staging  No matching staging information was found for the patient.   Oncology History Overview Note  # NOV 2013- SMOLDERING MULTIPLE MYELOMA [IgA Kappa; BMBx- 10-12% plasma cell;Dr.Trillo [place]]   # 2001- COLON CA STAGE III [s/p chemo; [place]]  # colonoscopy- 2018/ Dr.Anna  # ? UTI/kidney stone [Dr.Stoiff]- s/p Anti-biotics. S/p removal   DIAGNOSIS: Smoldering myeloma  GOALS: Cure  CURRENT/MOST RECENT THERAPY: Surveillance    Smoldering multiple myeloma (SMM)   INTERVAL HISTORY: Ambulating independently.  With his wife.   [name] 72 y.o.  male pleasant patient above history of smoldering myeloma currently on surveillance is here for follow-up.  Patient continues to play golf/bowling.  Chronic mild back pain not any worse.  Has been using Tylenol no other NSAIDs.   Review of Systems  Constitutional:  Negative for chills, diaphoresis, fever, malaise/fatigue and weight loss.  HENT:  Negative for nosebleeds and sore throat.   Eyes:  Negative for double vision.  Respiratory:  Negative for cough, hemoptysis, sputum production, shortness of breath and wheezing.   Cardiovascular:  Negative for chest pain, palpitations, orthopnea and leg swelling.  Gastrointestinal:  Negative for abdominal pain, blood in stool, constipation, diarrhea, heartburn, melena, nausea and vomiting.  Genitourinary:  Negative for dysuria, frequency and urgency.  Musculoskeletal:  Positive for back pain and joint pain.  Skin: Negative.  Negative for itching and rash.  Neurological:  Negative for dizziness, tingling, focal weakness, weakness and headaches.  Endo/Heme/Allergies:  Does not bruise/bleed easily.  Psychiatric/Behavioral:  Negative for depression. The patient is not nervous/anxious and  does not have insomnia.       PAST MEDICAL HISTORY :  Past Medical History:  Diagnosis Date   Abnormal laboratory test    Sees Dr. Ma Hillock   Acid reflux    Allergy bee venom   mainly as a child   Anxiety 12/08/1969   Arthritis    Asthma 12/08/1953   mainly as a child   Chronic back pain    Colon cancer ([place]) 11/1999   stage 3 s/p colon resection   Depression    H/O Clostridium difficile infection    History of chemotherapy    5-FU pump/leukovorin   History of kidney stones    Hypothyroidism    NO MEDS NOW   Insomnia    Multiple myeloma ([place]) 12/2011   smoldering vs mgus   Nephrolithiasis 08/28/2017   Osteoporosis    Seasonal allergies     PAST SURGICAL HISTORY :   Past Surgical History:  Procedure Laterality Date   BACK SURGERY     COLON SURGERY  2001   resection, 2nd surgery for scar tissue removal   COLONOSCOPY     COLONOSCOPY WITH PROPOFOL N/A 03/26/2016   Procedure: COLONOSCOPY WITH PROPOFOL;  Surgeon: Lollie Sails, MD;  Location: [facility] ENDOSCOPY;  Service: Endoscopy;  Laterality: N/A;   COLONOSCOPY WITH PROPOFOL N/A 06/16/2021   Procedure: COLONOSCOPY WITH PROPOFOL;  Surgeon: Jonathon Bellows, MD;  Location: [facility] ENDOSCOPY;  Service: Gastroenterology;  Laterality: N/A;   CYSTOSCOPY WITH URETEROSCOPY AND STENT PLACEMENT Left 02/21/2018   Procedure: URETEROSCOPY, LASER LITHOTRIPSY, STONE REMOVAL AND STENT PLACEMENT;  Surgeon: Abbie Sons, MD;  Location: [facility] ORS;  Service: Urology;  Laterality: Left;   CYSTOSCOPY/URETEROSCOPY/HOLMIUM LASER/STENT PLACEMENT Right 09/03/2017   Procedure: CYSTOSCOPY/URETEROSCOPY/HOLMIUM LASER/STENT  PLACEMENT;  Surgeon: Abbie Sons, MD;  Location: [facility] ORS;  Service: Urology;  Laterality: Right;   ESOPHAGOGASTRODUODENOSCOPY  10/2010   EYE SURGERY  1994 and 2010   RKA in 94 and [place] in 2010   [place] N/A 07/06/2018   Procedure: FLEXIBLE SIGMOIDOSCOPY;  Surgeon: Toledo, Benay Pike, MD;  Location: [facility] ENDOSCOPY;   Service: Gastroenterology;  Laterality: N/A;   HERNIA REPAIR  1970   lower left   HOLMIUM LASER APPLICATION Left 10/21/3844   Procedure: HOLMIUM LASER APPLICATION;  Surgeon: Abbie Sons, MD;  Location: [facility] ORS;  Service: Urology;  Laterality: Left;   LUMBAR FUSION     L4-L5, L5-S1   SACROILIAC JOINT FUSION     SHOULDER SURGERY Left    SPINAL CORD STIMULATOR IMPLANT      FAMILY HISTORY :   Family History  Problem Relation Age of Onset   Lymphoma Mother    Arthritis Mother        deceased   Cancer Mother    Lung cancer Father    Alcohol abuse Father        deceased   Cancer Father    Heart disease Other        grandparents   Hyperlipidemia Sister    Diabetes Brother    Diabetes Son    Alcohol abuse Brother    Arthritis Brother    Depression Brother    Diabetes Brother    Alcohol abuse Sister    Arthritis Sister    Depression Sister    Alcohol abuse Son    Early death Son        truck accident 61 at age 63    SOCIAL HISTORY:   Social History   Tobacco Use   Smoking status: Former    Packs/day: 1.00    Years: 10.00    Total pack years: 10.00    Types: Cigarettes    Quit date: 03/06/1970    Years since quitting: 51.9   Smokeless tobacco: Never   Tobacco comments:    started at age 38 and quit at age 23  Vaping Use   Vaping Use: Never used  Substance Use Topics   Alcohol use: No   Drug use: No    ALLERGIES:  is allergic to bee venom.  MEDICATIONS:  Current Outpatient Medications  Medication Sig Dispense Refill   acetaminophen (TYLENOL) 650 MG CR tablet Take 1,300 mg by mouth every 8 (eight) hours as needed for pain.     cholecalciferol (VITAMIN D3) 25 MCG (1000 UT) tablet Take 2,000 Units by mouth daily.      Multiple Vitamins-Minerals (CENTRUM SILVER ULTRA MENS) TABS Take 1 tablet by mouth daily.     tiZANidine (ZANAFLEX) 4 MG tablet Take 0.5-1.5 tablets (2-6 mg total) by mouth every 8 (eight) hours as needed for muscle spasms (muscle tightness).  90 tablet 2   predniSONE (STERAPRED UNI-PAK 21 TAB) 10 MG (21) TBPK tablet Take as directed on package.  (60 mg po on day 1, 50 mg po on day 2...) 21 tablet 0   tamsulosin (FLOMAX) 0.4 MG CAPS capsule Take 1 capsule (0.4 mg total) by mouth daily. 30 capsule 0   Vibegron (GEMTESA) 75 MG TABS Take 75 mg by mouth daily. (Patient not taking: Reported on 10/23/2021) 30 tablet 0   No current facility-administered medications for this visit.    PHYSICAL EXAMINATION: ECOG PERFORMANCE STATUS: 0 - Asymptomatic  BP (!) 150/80 (BP Location: Left Arm, Patient Position:  Sitting)   Pulse (!) 50   Temp (!) 96.3 F (35.7 C) (Tympanic)   Resp 16   Wt 138 lb 12.8 oz (63 kg)   BMI 21.74 kg/m   Filed Weights   02/19/22 1000  Weight: 138 lb 12.8 oz (63 kg)    Physical Exam HENT:     Head: Normocephalic and atraumatic.     Mouth/Throat:     Pharynx: No oropharyngeal exudate.  Eyes:     Pupils: Pupils are equal, round, and reactive to light.  Cardiovascular:     Rate and Rhythm: Normal rate and regular rhythm.  Pulmonary:     Effort: No respiratory distress.     Breath sounds: No wheezing.  Abdominal:     General: Bowel sounds are normal. There is no distension.     Palpations: Abdomen is soft. There is no mass.     Tenderness: There is no abdominal tenderness. There is no guarding or rebound.  Musculoskeletal:        General: No tenderness. Normal range of motion.     Cervical back: Normal range of motion and neck supple.  Skin:    General: Skin is warm.  Neurological:     Mental Status: He is alert and oriented to person, place, and time.  Psychiatric:        Mood and Affect: Affect normal.    LABORATORY DATA:  I have reviewed the data as listed    Component Value Date/Time   NA 138 02/12/2022 0900   K 4.3 02/12/2022 0900   CL 102 02/12/2022 0900   CO2 29 02/12/2022 0900   GLUCOSE 107 (H) 02/12/2022 0900   BUN 17 02/12/2022 0900   CREATININE 1.40 (H) 02/12/2022 0900    CREATININE 1.27 (H) 11/25/2017 1401   CALCIUM 9.5 02/12/2022 0900   CALCIUM 9.6 12/28/2013 1428   PROT 6.9 02/12/2022 0900   PROT 6.7 06/27/2012 1136   ALBUMIN 4.3 02/12/2022 0900   ALBUMIN 3.5 06/27/2012 1136   AST 23 02/12/2022 0900   AST 17 06/27/2012 1136   ALT 19 02/12/2022 0900   ALT 22 06/27/2012 1136   ALKPHOS 71 02/12/2022 0900   ALKPHOS 91 06/27/2012 1136   BILITOT 0.5 02/12/2022 0900   BILITOT 0.3 06/27/2012 1136   GFRNONAA 53 (L) 02/12/2022 0900   GFRNONAA 58 (L) 11/25/2017 1401   GFRAA >60 07/17/2019 0955   GFRAA 67 11/25/2017 1401    No results found for: "SPEP", "UPEP"  Lab Results  Component Value Date   WBC 6.9 02/12/2022   NEUTROABS 3.6 02/12/2022   HGB 15.4 02/12/2022   HCT 44.0 02/12/2022   MCV 93.8 02/12/2022   PLT 373 02/12/2022      Chemistry      Component Value Date/Time   NA 138 02/12/2022 0900   K 4.3 02/12/2022 0900   CL 102 02/12/2022 0900   CO2 29 02/12/2022 0900   BUN 17 02/12/2022 0900   CREATININE 1.40 (H) 02/12/2022 0900   CREATININE 1.27 (H) 11/25/2017 1401      Component Value Date/Time   CALCIUM 9.5 02/12/2022 0900   CALCIUM 9.6 12/28/2013 1428   ALKPHOS 71 02/12/2022 0900   ALKPHOS 91 06/27/2012 1136   AST 23 02/12/2022 0900   AST 17 06/27/2012 1136   ALT 19 02/12/2022 0900   ALT 22 06/27/2012 1136   BILITOT 0.5 02/12/2022 0900   BILITOT 0.3 06/27/2012 1136      RADIOGRAPHIC STUDIES: I have personally reviewed  the radiological images as listed and agreed with the findings in the report. No results found.   ASSESSMENT & PLAN:  Smoldering multiple myeloma (SMM) ([place]) # SMOLDERING MULTIPLE MYELOMA [2013 s/p BMBx]. No clinical evidence of progression.  Clinically Stable. JAN 2024- M protein 0.3 g/dL; kappa lambda light chain ratio ~6.  Stable. Do not suspect any clinical progression of multiple myeloma causing slightly abnormal GFR.  # GFR- 53; s/p evaluation with Dr.Stoioff-PSA June 2023 2.0 on Flomax. Monitor.  Stable.  # Arthritis- recommend HOLD off NSAIDS; ok to take tylenol prn as needed. Stable.   # DISPOSITION:  # follow up in 6 months- 1 week prior labs-MD-cbc/cmp/MM panel/K-L light chains- -Dr.B   No orders of the defined types were placed in this encounter.  All questions were answered. The patient knows to call the clinic with any problems, questions or concerns.      Cammie Sickle, MD 02/19/2022 12:13 PM

## 2022-02-19 NOTE — Assessment & Plan Note (Addendum)
#  SMOLDERING MULTIPLE MYELOMA [2013 s/p BMBx]. No clinical evidence of progression.  Clinically Stable. JAN 2024- M protein 0.3 g/dL; kappa lambda light chain ratio ~6.  Stable. Do not suspect any clinical progression of multiple myeloma causing slightly abnormal GFR.  # GFR- 53; s/p evaluation with Dr.Stoioff-PSA June 2023 2.0 on Flomax. Monitor. Stable.  # Arthritis- recommend HOLD off NSAIDS; ok to take tylenol prn as needed. Stable.   # DISPOSITION:  # follow up in 6 months- 1 week prior labs-MD-cbc/cmp/MM panel/K-L light chains- -Dr.B

## 2022-02-19 NOTE — Progress Notes (Signed)
Patient denies new problems/concerns today.   °

## 2022-02-27 ENCOUNTER — Telehealth (INDEPENDENT_AMBULATORY_CARE_PROVIDER_SITE_OTHER): Payer: Medicare Other | Admitting: Physician Assistant

## 2022-02-27 ENCOUNTER — Ambulatory Visit: Payer: Self-pay | Admitting: *Deleted

## 2022-02-27 ENCOUNTER — Telehealth: Payer: Self-pay

## 2022-02-27 ENCOUNTER — Telehealth: Payer: Medicare Other | Admitting: Internal Medicine

## 2022-02-27 DIAGNOSIS — U071 COVID-19: Secondary | ICD-10-CM | POA: Diagnosis not present

## 2022-02-27 MED ORDER — NIRMATRELVIR/RITONAVIR (PAXLOVID)TABLET: 1.0000 | ORAL_TABLET | Freq: Two times a day (BID) | ORAL | 0 refills | Status: AC

## 2022-02-27 NOTE — Telephone Encounter (Signed)
Spoke to [place], pharmacist needed clarification on rx from today. Clarification was cleared pt needed renal dose and Erin- Pa and discussed with pharmacist.

## 2022-02-27 NOTE — Telephone Encounter (Signed)
  Chief Complaint: pharmacy staff Marita Kansas from [facility] calling to clarify paxlovid dose  and quantity. Symptoms: na Frequency: na Pertinent Negatives: Patient denies na Disposition: '[]'$ ED /'[]'$ Urgent Care (no appt availability in office) / '[]'$ Appointment(In office/virtual)/ '[]'$  [facility] [facility]/ '[]'$ Home Care/ '[]'$ Refused Recommended Disposition /'[]'$  Mobile Bus/ '[x]'$  Follow-up with PCP Additional Notes:  Please clarify dose and quantity for renal dose .  Call transferred to [facility] / CMA, Stanton Kidney  to clarify dose .    Reason for Disposition  [1] Pharmacy calling with prescription question AND [2] triager unable to answer question  Answer Assessment - Initial Assessment Questions 1. NAME of MEDICINE: "What medicine(s) are you calling about?"     Paxlovid per pharmacy staff Kristy 2. QUESTION: "What is your question?" (e.g., double dose of medicine, side effect)     Do you want renal dose for paxlovid?  3. PRESCRIBER: "Who prescribed the medicine?" Reason: if prescribed by specialist, call should be referred to that group.     E. Mecum, PA 4. SYMPTOMS: "Do you have any symptoms?" If Yes, ask: "What symptoms are you having?"  "How bad are the symptoms (e.g., mild, moderate, severe)     Na  5. PREGNANCY:  "Is there any chance that you are pregnant?" "When was your last menstrual period?"     na  Protocols used: Medication Question Call-A-AH

## 2022-02-27 NOTE — Progress Notes (Signed)
     Virtual Visit via Video Note  I connected with [name] on 02/27/22 at 11:00 AM EST by a video enabled telemedicine application and verified that I am speaking with the correct person using two identifiers.  Location: Patient: at home  Provider: [place], [place]    I discussed the limitations of evaluation and management by telemedicine and the availability of in person appointments. The patient expressed understanding and agreed to proceed.   Today's Provider: Talitha Givens, MHS, PA-C Introduced myself to the patient as a PA-C and provided education on APPs in clinical practice.    Chief Complaint  Patient presents with   Covid Positive    Home tested today   Cough   Nasal Congestion    History of Present Illness:  COVID positive with home test  Onset: sudden  Duration: started maybe Sun evening  Associated symptoms: coughing, sneezing, rhinorrhea, chills,  Reports he hasn't been around many people lately so he's concerned about how he got it Date of COVID testing: today  Interventions: OTC cough drops   Current temp is 98.6 on virtual visit      Review of Systems  Constitutional:  Positive for chills and malaise/fatigue. Negative for fever.  HENT:  Positive for congestion.   Respiratory:  Positive for cough. Negative for shortness of breath and wheezing.   Gastrointestinal:  Negative for diarrhea, nausea and vomiting.      Observations/Objective:  Due to the nature of the virtual visit, physical exam and observations are limited. Able to obtain the following observations:   Alert, oriented, Appears comfortable, in no acute distress.  No scleral injection, no appreciated hoarseness, tachypnea, wheeze or strider. Able to maintain conversation without visible strain.  Mild cough appreciated during visit.    Assessment and Plan:  Problem List Items Addressed This Visit   None Visit Diagnoses     COVID-19    -   Primary Acute, new concern Reports symptoms started on Sunday and have been lingering Tested positive for COVID today with home testing and would like to start Paxlovid- medication list reviewed and GFR is 54 Will send in script for renally dosed Paxlovid Recommend he take OTC medications for symptomatic management Return and ED precautions reviewed Follow up as needed for persistent or progressing symptoms    Relevant Medications   nirmatrelvir/ritonavir (PAXLOVID) 20 x 150 MG & 10 x '100MG'$  TABS      Follow Up Instructions:    I discussed the assessment and treatment plan with the patient. The patient was provided an opportunity to ask questions and all were answered. The patient agreed with the plan and demonstrated an understanding of the instructions.   The patient was advised to call back or seek an in-person evaluation if the symptoms worsen or if the condition fails to improve as anticipated.  I provided 9 minutes of non-face-to-face time during this encounter.  No follow-ups on file.   I, Klee Kolek E Sinthia Karabin, PA-C, have reviewed all documentation for this visit. The documentation on 02/27/22 for the exam, diagnosis, procedures, and orders are all accurate and complete.   Talitha Givens, MHS, PA-C [facility]

## 2022-02-28 NOTE — Telephone Encounter (Signed)
Different telephone encounter was made and clarification was cleared up.

## 2022-03-20 ENCOUNTER — Ambulatory Visit: Payer: Medicare Other

## 2022-03-23 ENCOUNTER — Ambulatory Visit (INDEPENDENT_AMBULATORY_CARE_PROVIDER_SITE_OTHER): Payer: Medicare Other

## 2022-03-23 VITALS — Ht 67.0 in | Wt 135.0 lb

## 2022-03-23 DIAGNOSIS — Z0000 Encounter for general adult medical examination without abnormal findings: Secondary | ICD-10-CM

## 2022-03-23 NOTE — Progress Notes (Signed)
I connected with  [name] on 03/23/22 by a audio enabled telemedicine application and verified that I am speaking with the correct person using two identifiers.  Patient Location: Home  Provider Location: Office/Clinic  I discussed the limitations of evaluation and management by telemedicine. The patient expressed understanding and agreed to proceed.  Subjective:   [name] is a 72 y.o. male who presents for Medicare Annual/Subsequent preventive examination.  Review of Systems    Cardiac Risk Factors include: advanced age (>30mn, >>36women);male gender     Objective:    Today's Vitals   03/23/22 1416  Weight: 135 lb (61.2 kg)  Height: 5' 7"$  (1.702 m)   Body mass index is 21.14 kg/m.     03/23/2022    2:31 PM 02/19/2022   10:30 AM 11/08/2021    9:07 AM 08/02/2021    1:43 PM 06/16/2021    7:37 AM 03/16/2021    2:54 PM 02/01/2021    2:40 PM  Advanced Directives  Does Patient Have a Medical Advance Directive? Yes Yes Yes No Yes Yes Yes  Type of Advance Directive Living will;Healthcare Power of Attorney Living will;Healthcare Power of Attorney   Living will;Healthcare Power of [place]   [place] of [place]? No - copy requested    No - copy requested No - copy requested   Would patient like information on creating a medical advance directive?    No - Patient declined       Current Medications (verified) Outpatient Encounter Medications as of 03/23/2022  Medication Sig   acetaminophen (TYLENOL) 650 MG CR tablet Take 1,300 mg by mouth every 8 (eight) hours as needed for pain.   cholecalciferol (VITAMIN D3) 25 MCG (1000 UT) tablet Take 2,000 Units by mouth daily.    Multiple Vitamins-Minerals (CENTRUM SILVER ULTRA MENS) TABS Take 1 tablet by mouth daily.   tiZANidine (ZANAFLEX) 4 MG tablet Take 0.5-1.5 tablets (2-6 mg total) by mouth every 8 (eight) hours as needed for muscle spasms (muscle tightness).   No  facility-administered encounter medications on file as of 03/23/2022.    Allergies (verified) Bee venom   History: Past Medical History:  Diagnosis Date   Abnormal laboratory test    Sees Dr. PMa Hillock  Acid reflux    Allergy bee venom   mainly as a child   Anxiety 12/08/1969   Arthritis    Asthma 12/08/1953   mainly as a child   Chronic back pain    [place]   stage 3 s/p colon resection   Depression    H/O Clostridium difficile infection    History of chemotherapy    5-FU pump/leukovorin   History of kidney stones    Hypothyroidism    NO MEDS NOW   Insomnia    Multiple myeloma [place] 12[place]   smoldering vs mgus   Nephrolithiasis 08/28/2017   Osteoporosis    Seasonal allergies    Past Surgical History:  Procedure Laterality Date   BACK SURGERY     COLON SURGERY  2001   resection, 2nd surgery for scar tissue removal   COLONOSCOPY     COLONOSCOPY WITH PROPOFOL N/A 03/26/2016   Procedure: COLONOSCOPY WITH PROPOFOL;  Surgeon: MLollie Sails MD;  [facility]  Service: Endoscopy;  Laterality: N/A;   COLONOSCOPY WITH PROPOFOL N/A 06/16/2021   Procedure: COLONOSCOPY WITH PROPOFOL;  Surgeon: AJonathon Bellows MD;  [place]  Service: Gastroenterology;  Laterality: N/A;   CYSTOSCOPY WITH URETEROSCOPY AND STENT PLACEMENT Left 02/21/2018   Procedure: URETEROSCOPY, LASER LITHOTRIPSY, STONE REMOVAL AND STENT PLACEMENT;  Surgeon: Abbie Sons, MD;  Location: [facility];  Service: Urology;  Laterality: Left;   CYSTOSCOPY/URETEROSCOPY/HOLMIUM LASER/STENT PLACEMENT Right 09/03/2017   Procedure: CYSTOSCOPY/URETEROSCOPY/HOLMIUM LASER/STENT PLACEMENT;  Surgeon: Abbie Sons, MD;  Location: [facility];  Service: Urology;  Laterality: Right;   ESOPHAGOGASTRODUODENOSCOPY  10/2010   EYE SURGERY  1994 and 2010   [place]KA in 94 and [place] in 2010   [place] N/A 07/06/2018   Procedure: FLEXIBLE SIGMOIDOSCOPY;  Surgeon: Toledo, Benay Pike, MD;   Location: [facility] ENDOSCOPY;  Service: Gastroenterology;  Laterality: N/A;   HERNIA REPAIR  1970   lower left   HOLMIUM LASER APPLICATION Left [id number]   Procedure: HOLMIUM LASER APPLICATION;  Surgeon: Abbie Sons, MD;  Location: [facility];  Service: Urology;  Laterality: Left;   LUMBAR FUSION     L4-L5, L5-S1   SACROILIAC JOINT FUSION     SHOULDER SURGERY Left    SPINAL CORD STIMULATOR IMPLANT     Family History  Problem Relation Age of Onset   Lymphoma Mother    Arthritis Mother        deceased   Cancer Mother    Lung cancer Father    Alcohol abuse Father        deceased   Cancer Father    Heart disease Other        grandparents   Hyperlipidemia Sister    Diabetes Brother    Diabetes Son    Alcohol abuse Brother    Arthritis Brother    Depression Brother    Diabetes Brother    Alcohol abuse Sister    Arthritis Sister    Depression Sister    Alcohol abuse Son    Early death Son        truck accident 6 at age 3   Social History   Socioeconomic History   Marital status: Married    Spouse name: Not on file   Number of children: 2   Years of education: Not on file   Highest education level: High school graduate  Occupational History   Occupation: Retired  Tobacco Use   Smoking status: Former    Packs/day: 1.00    Years: 10.00    Total pack years: 10.00    Types: Cigarettes    Quit date: 03/06/1970    Years since quitting: 52.0   Smokeless tobacco: Never   Tobacco comments:    started at age 17 and quit at age 66  Vaping Use   Vaping Use: Never used  Substance and Sexual Activity   Alcohol use: No   Drug use: No   Sexual activity: Yes    Birth control/protection: None  Other Topics Concern   Not on file  Social History Narrative   Pt is an avid golfer and bowls every week   Social Determinants of Health   Financial Resource Strain: Low Risk  (03/16/2021)   Overall Financial Resource Strain (CARDIA)    Difficulty of Paying Living Expenses: Not  hard at all  Food Insecurity: No Food Insecurity (03/16/2021)   Hunger Vital Sign    Worried About Running Out of Food in the Last Year: Never true    Ran Out of Food in the Last Year: Never true  Transportation Needs: No Transportation Needs (03/23/2022)   PRAPARE - Transportation    Lack  of Transportation (Medical): No    Lack of Transportation (Non-Medical): No  Physical Activity: Insufficiently Active (03/23/2022)   Exercise Vital Sign    Days of Exercise per Week: 2 days    Minutes of Exercise per Session: 30 min  Stress: No Stress Concern Present (03/23/2022)   [place]    Feeling of Stress : Not at all  Social Connections: Moderately Integrated (03/23/2022)   Social Connection and Isolation Panel [NHANES]    Frequency of Communication with Friends and Family: More than three times a week    Frequency of Social Gatherings with Friends and Family: More than three times a week    Attends Religious Services: More than 4 times per year    Active Member of [employer] or Organizations: No    Attends Archivist Meetings: Never    Marital Status: Married    Tobacco Counseling Counseling given: Not Answered Tobacco comments: started at age 83 and quit at age 80   Clinical Intake:  Pre-visit preparation completed: Yes  Pain : No/denies pain     BMI - recorded: 21.14 Nutritional Status: BMI of 19-24  Normal Nutritional Risks: None Diabetes: No  How often do you need to have someone help you when you read instructions, pamphlets, or other written materials from your doctor or pharmacy?: 1 - Never  Diabetic?no  Interpreter Needed?: No  Information entered by :: B.Pinky Ravan,LPN   Activities of Daily Living    03/23/2022    2:31 PM 02/27/2022    9:56 AM  In your present state of health, do you have any difficulty performing the following activities:  Hearing? 0 0  Vision? 0 0  Difficulty concentrating  or making decisions? 0 0  Walking or climbing stairs? 0 0  Dressing or bathing? 0 0  Doing errands, shopping? 0 0  Preparing Food and eating ? N   Using the Toilet? N   In the past six months, have you accidently leaked urine? N   Do you have problems with loss of bowel control? N   Managing your Medications? N   Managing your Finances? N   Housekeeping or managing your Housekeeping? N     Patient Care Team: Delsa Grana, PA-C as PCP - General (Family Medicine) Cammie Sickle, MD as Consulting Physician (Internal Medicine)  Indicate any recent Medical Services you may have received from other than [facility] providers in the past year (date may be approximate).     Assessment:   This is a routine wellness examination for [name].  Hearing/Vision screen Hearing Screening - Comments:: Adequate hearing w/hearing aides;wears sometimes Vision Screening - Comments:: Adequate vision:cataract surgery;only readers. Don't go to eye md  Dietary issues and exercise activities discussed: Current Exercise Habits: Home exercise routine, Type of exercise: walking;Other - see comments, Time (Minutes): 60, Frequency (Times/Week): 3, Weekly Exercise (Minutes/Week): 180, Intensity: Mild, Exercise limited by: None identified   Goals Addressed             This Visit's Progress    DIET - INCREASE WATER INTAKE   On track    Recommend to drink at least 6-8 8oz glasses of water per day.       Depression Screen    03/23/2022    2:27 PM 02/27/2022    9:56 AM 10/23/2021   11:01 AM 03/16/2021    2:53 PM 03/15/2021    9:46 AM 11/30/2020    9:55 AM 02/13/2019  9:36 AM  PHQ 2/9 Scores  PHQ - 2 Score 0 0 0 0 0 0 0  PHQ- 9 Score 0 0    0     Fall Risk    03/23/2022    2:22 PM 02/27/2022    9:56 AM 10/23/2021   11:00 AM 03/16/2021    2:54 PM 03/15/2021    6:35 PM  Fall Risk   Falls in the past year? 0 0 0 0 0  Number falls in past yr: 0 0 0 0   Injury with Fall? 0 0 0 0   Risk for fall due to : No  Fall Risks No Fall Risks  No Fall Risks   Follow up Education provided;Falls prevention discussed Falls prevention discussed;Education provided;Falls evaluation completed Falls evaluation completed Falls prevention discussed     FALL RISK PREVENTION PERTAINING TO THE HOME:  Any stairs in or around the home? Yes  If so, are there any without handrails? Yes  Home free of loose throw rugs in walkways, pet beds, electrical cords, etc? Yes  Adequate lighting in your home to reduce risk of falls? Yes   ASSISTIVE DEVICES UTILIZED TO PREVENT FALLS:  Life alert? No  Use of a cane, walker or w/c? No  Grab bars in the bathroom? Yes  Shower chair or bench in shower? Yes dont use it Elevated toilet seat or a handicapped toilet? No    Cognitive Function:        03/23/2022    2:32 PM 01/10/2018   10:28 AM 01/09/2016    3:19 PM  6CIT Screen  What Year? 0 points 0 points 0 points  What month? 0 points 0 points 0 points  What time? 0 points 0 points 0 points  Count back from 20 0 points 0 points 0 points  Months in reverse 0 points 0 points 0 points  Repeat phrase 0 points 0 points 2 points  Total Score 0 points 0 points 2 points    Immunizations Immunization History  Administered Date(s) Administered   Fluad Quad(high Dose 65+) 11/14/2018, 11/02/2019   Influenza, High Dose Seasonal PF 11/04/2014, 01/26/2016, 11/07/2016, 12/24/2017, 12/06/2020   Influenza-Unspecified 01/26/2016   PFIZER(Purple Top)SARS-COV-2 Vaccination 02/26/2019, 03/19/2019, 11/06/2019   Pneumococcal Conjugate-13 01/09/2016   Pneumococcal Polysaccharide-23 01/09/2017   Tdap 07/06/2012    TDAP status: Up to date  Flu Vaccine status: Due, Education has been provided regarding the importance of this vaccine. Advised may receive this vaccine at local pharmacy or Health Dept. Aware to provide a copy of the vaccination record if obtained from local pharmacy or Health Dept. Verbalized acceptance and  understanding.  Pneumococcal vaccine status: Up to date  Covid-19 vaccine status: Completed vaccines  Qualifies for Shingles Vaccine? Yes   Zostavax completed No   Shingrix Completed?: No.    Education has been provided regarding the importance of this vaccine. Patient has been advised to call insurance company to determine out of pocket expense if they have not yet received this vaccine. Advised may also receive vaccine at local pharmacy or Health Dept. Verbalized acceptance and understanding.  Screening Tests Health Maintenance  Topic Date Due   Zoster Vaccines- Shingrix (1 of 2) Never done   COVID-19 Vaccine (4 - 2023-24 season) 10/06/2021   INFLUENZA VACCINE  05/06/2022 (Originally 09/05/2021)   DTaP/Tdap/Td (2 - Td or Tdap) 07/07/2022   Medicare Annual Wellness (AWV)  03/24/2023   COLONOSCOPY (Pts 45-44yr Insurance coverage will need to be confirmed)  06/17/2026  Pneumonia Vaccine 68+ Years old  Completed   Hepatitis C Screening  Completed   HPV VACCINES  Aged Out    Health Maintenance  Health Maintenance Due  Topic Date Due   Zoster Vaccines- Shingrix (1 of 2) Never done   COVID-19 Vaccine (4 - 2023-24 season) 10/06/2021    Colorectal cancer screening: Type of screening: Colonoscopy. Completed yes. Repeat every 5 years  Lung Cancer Screening: (Low Dose CT Chest recommended if Age 77-80 years, 30 pack-year currently smoking OR have quit w/in 15years.) does not qualify.   Lung Cancer Screening Referral: no  Additional Screening:  Hepatitis C Screening: does not qualify; Completed no  Vision Screening: Recommended annual ophthalmology exams for early detection of glaucoma and other disorders of the eye. Is the patient up to date with their annual eye exam?  No  Who is the provider or what is the name of the office in which the patient attends annual eye exams? none If pt is not established with a provider, would they like to be referred to a provider to establish care?  No .   Dental Screening: Recommended annual dental exams for proper oral hygiene  Community Resource Referral / Chronic Care Management: CRR required this visit?  No   CCM required this visit?  No    Plan:     I have personally reviewed and noted the following in the patient's chart:   Medical and social history Use of alcohol, tobacco or illicit drugs  Current medications and supplements including opioid prescriptions. Patient is not currently taking opioid prescriptions. Functional ability and status Nutritional status Physical activity Advanced directives List of other physicians Hospitalizations, surgeries, and ER visits in previous 12 months Vitals Screenings to include cognitive, depression, and falls Referrals and appointments  In addition, I have reviewed and discussed with patient certain preventive protocols, quality metrics, and best practice recommendations. A written personalized care plan for preventive services as well as general preventive health recommendations were provided to patient.     Roger Shelter, LPN   [id number]   Nurse Notes: pt states he is doing well;he has no concerns or questions. Pt is staying active playing golf and walking.

## 2022-03-23 NOTE — Patient Instructions (Addendum)
[name] , Thank you for taking time to come for your Medicare Wellness Visit. I appreciate your ongoing commitment to your health goals. Please review the following plan we discussed and let me know if I can assist you in the future.   These are the goals we discussed:  Goals       DIET - INCREASE WATER INTAKE      Recommend to drink at least 6-8 8oz glasses of water per day.      I don't have any real needs I am just glad I have someone I can call when I need them (pt-stated)      Current Barriers:  Knowledge Deficits related to Chronic Case Management Program and [place]  Nurse Case Manager Clinical Goal(s):  Over the next 30 days, Patient will utilize RN CM as discussed today if needs arise  Interventions:  Assessed for educational needs related to patients ability to manage his health Discussed services provided by CCM Team Assessed for social determinants of health Provided patient with contact information for CCM Team Discussed CDC guidelines for Covid-19 infection prevention  Patient Self Care Activities:  Self administers medications as prescribed Attends all scheduled provider appointments Calls pharmacy for medication refills Performs ADL's independently Performs IADL's independently Calls provider office for new concerns or questions  Contact CCM RN CM if additional needs arise   Initial goal documentation         This is a list of the screening recommended for you and due dates:  Health Maintenance  Topic Date Due   Zoster (Shingles) Vaccine (1 of 2) Never done   COVID-19 Vaccine (4 - 2023-24 season) 10/06/2021   Flu Shot  05/06/2022*   DTaP/Tdap/Td vaccine (2 - Td or Tdap) 07/07/2022   Medicare Annual Wellness Visit  03/24/2023   Colon Cancer Screening  06/17/2026   Pneumonia Vaccine  Completed   Hepatitis C Screening: USPSTF Recommendation to screen - Ages 13-79 yo.  Completed   HPV Vaccine  Aged Out  *Topic was postponed. The date shown is not  the original due date.    Advanced directives: yes  Conditions/risks identified: none  Next appointment: Follow up in one year for your annual wellness visit. 03/29/2023@ 2:30pm  Preventive Care 65 Years and Older, Male  Preventive care refers to lifestyle choices and visits with your health care provider that can promote health and wellness. What does preventive care include? A yearly physical exam. This is also called an annual well check. Dental exams once or twice a year. Routine eye exams. Ask your health care provider how often you should have your eyes checked. Personal lifestyle choices, including: Daily care of your teeth and gums. Regular physical activity. Eating a healthy diet. Avoiding tobacco and drug use. Limiting alcohol use. Practicing safe sex. Taking low doses of aspirin every day. Taking vitamin and mineral supplements as recommended by your health care provider. What happens during an annual well check? The services and screenings done by your health care provider during your annual well check will depend on your age, overall health, lifestyle risk factors, and family history of disease. Counseling  Your health care provider may ask you questions about your: Alcohol use. Tobacco use. Drug use. Emotional well-being. Home and relationship well-being. Sexual activity. Eating habits. History of falls. Memory and ability to understand (cognition). Work and work Statistician. Screening  You may have the following tests or measurements: Height, weight, and BMI. Blood pressure. Lipid and cholesterol levels. These may  be checked every 5 years, or more frequently if you are over 54 years old. Skin check. Lung cancer screening. You may have this screening every year starting at age 24 if you have a 30-pack-year history of smoking and currently smoke or have quit within the past 15 years. Fecal occult blood test (FOBT) of the stool. You may have this test every  year starting at age 11. Flexible sigmoidoscopy or colonoscopy. You may have a sigmoidoscopy every 5 years or a colonoscopy every 10 years starting at age 73. Prostate cancer screening. Recommendations will vary depending on your family history and other risks. Hepatitis C blood test. Hepatitis B blood test. Sexually transmitted disease (STD) testing. Diabetes screening. This is done by checking your blood sugar (glucose) after you have not eaten for a while (fasting). You may have this done every 1-3 years. Abdominal aortic aneurysm (AAA) screening. You may need this if you are a current or former smoker. Osteoporosis. You may be screened starting at age 8 if you are at high risk. Talk with your health care provider about your test results, treatment options, and if necessary, the need for more tests. Vaccines  Your health care provider may recommend certain vaccines, such as: Influenza vaccine. This is recommended every year. Tetanus, diphtheria, and acellular pertussis (Tdap, Td) vaccine. You may need a Td booster every 10 years. Zoster vaccine. You may need this after age 22. Pneumococcal 13-valent conjugate (PCV13) vaccine. One dose is recommended after age 59. Pneumococcal polysaccharide (PPSV23) vaccine. One dose is recommended after age 7. Talk to your health care provider about which screenings and vaccines you need and how often you need them. This information is not intended to replace advice given to you by your health care provider. Make sure you discuss any questions you have with your health care provider. Document Released: 02/18/2015 Document Revised: 10/12/2015 Document Reviewed: 11/23/2014 Elsevier Interactive Patient Education  2017 [employer] Prevention in the Home Falls can cause injuries. They can happen to people of all ages. There are many things you can do to make your home safe and to help prevent falls. What can I do on the outside of my  home? Regularly fix the edges of walkways and driveways and fix any cracks. Remove anything that might make you trip as you walk through a door, such as a raised step or threshold. Trim any bushes or trees on the path to your home. Use bright outdoor lighting. Clear any walking paths of anything that might make someone trip, such as rocks or tools. Regularly check to see if handrails are loose or broken. Make sure that both sides of any steps have handrails. Any raised decks and porches should have guardrails on the edges. Have any leaves, snow, or ice cleared regularly. Use sand or salt on walking paths during winter. Clean up any spills in your garage right away. This includes oil or grease spills. What can I do in the bathroom? Use night lights. Install grab bars by the toilet and in the tub and shower. Do not use towel bars as grab bars. Use non-skid mats or decals in the tub or shower. If you need to sit down in the shower, use a plastic, non-slip stool. Keep the floor dry. Clean up any water that spills on the floor as soon as it happens. Remove soap buildup in the tub or shower regularly. Attach bath mats securely with double-sided non-slip rug tape. Do not have throw rugs and  other things on the floor that can make you trip. What can I do in the bedroom? Use night lights. Make sure that you have a light by your bed that is easy to reach. Do not use any sheets or blankets that are too big for your bed. They should not hang down onto the floor. Have a firm chair that has side arms. You can use this for support while you get dressed. Do not have throw rugs and other things on the floor that can make you trip. What can I do in the kitchen? Clean up any spills right away. Avoid walking on wet floors. Keep items that you use a lot in easy-to-reach places. If you need to reach something above you, use a strong step stool that has a grab bar. Keep electrical cords out of the way. Do  not use floor polish or wax that makes floors slippery. If you must use wax, use non-skid floor wax. Do not have throw rugs and other things on the floor that can make you trip. What can I do with my stairs? Do not leave any items on the stairs. Make sure that there are handrails on both sides of the stairs and use them. Fix handrails that are broken or loose. Make sure that handrails are as long as the stairways. Check any carpeting to make sure that it is firmly attached to the stairs. Fix any carpet that is loose or worn. Avoid having throw rugs at the top or bottom of the stairs. If you do have throw rugs, attach them to the floor with carpet tape. Make sure that you have a light switch at the top of the stairs and the bottom of the stairs. If you do not have them, ask someone to add them for you. What else can I do to help prevent falls? Wear shoes that: Do not have high heels. Have rubber bottoms. Are comfortable and fit you well. Are closed at the toe. Do not wear sandals. If you use a stepladder: Make sure that it is fully opened. Do not climb a closed stepladder. Make sure that both sides of the stepladder are locked into place. Ask someone to hold it for you, if possible. Clearly [place] and make sure that you can see: Any grab bars or handrails. First and last steps. Where the edge of each step is. Use tools that help you move around (mobility aids) if they are needed. These include: Canes. Walkers. Scooters. Crutches. Turn on the lights when you go into a dark area. Replace any light bulbs as soon as they burn out. Set up your furniture so you have a clear path. Avoid moving your furniture around. If any of your floors are uneven, fix them. If there are any pets around you, be aware of where they are. Review your medicines with your doctor. Some medicines can make you feel dizzy. This can increase your chance of falling. Ask your doctor what other things that you can do to  help prevent falls. This information is not intended to replace advice given to you by your health care provider. Make sure you discuss any questions you have with your health care provider. Document Released: 11/18/2008 Document Revised: 06/30/2015 Document Reviewed: 02/26/2014 Elsevier Interactive Patient Education  2017 [employer].

## 2022-04-05 ENCOUNTER — Encounter: Payer: Self-pay | Admitting: Family Medicine

## 2022-04-05 ENCOUNTER — Ambulatory Visit (INDEPENDENT_AMBULATORY_CARE_PROVIDER_SITE_OTHER): Payer: Medicare Other | Admitting: Family Medicine

## 2022-04-05 VITALS — BP 134/76 | HR 76 | Temp 98.0°F | Resp 16 | Ht 67.0 in | Wt 134.8 lb

## 2022-04-05 DIAGNOSIS — I10 Essential (primary) hypertension: Secondary | ICD-10-CM | POA: Insufficient documentation

## 2022-04-05 DIAGNOSIS — N400 Enlarged prostate without lower urinary tract symptoms: Secondary | ICD-10-CM | POA: Insufficient documentation

## 2022-04-05 DIAGNOSIS — F411 Generalized anxiety disorder: Secondary | ICD-10-CM

## 2022-04-05 DIAGNOSIS — Z23 Encounter for immunization: Secondary | ICD-10-CM

## 2022-04-05 DIAGNOSIS — Z91030 Bee allergy status: Secondary | ICD-10-CM

## 2022-04-05 DIAGNOSIS — Z981 Arthrodesis status: Secondary | ICD-10-CM | POA: Insufficient documentation

## 2022-04-05 DIAGNOSIS — N1831 Chronic kidney disease, stage 3a: Secondary | ICD-10-CM | POA: Diagnosis not present

## 2022-04-05 DIAGNOSIS — G8929 Other chronic pain: Secondary | ICD-10-CM | POA: Insufficient documentation

## 2022-04-05 DIAGNOSIS — K219 Gastro-esophageal reflux disease without esophagitis: Secondary | ICD-10-CM

## 2022-04-05 DIAGNOSIS — C9000 Multiple myeloma not having achieved remission: Secondary | ICD-10-CM | POA: Insufficient documentation

## 2022-04-05 DIAGNOSIS — F324 Major depressive disorder, single episode, in partial remission: Secondary | ICD-10-CM

## 2022-04-05 DIAGNOSIS — M810 Age-related osteoporosis without current pathological fracture: Secondary | ICD-10-CM | POA: Diagnosis not present

## 2022-04-05 DIAGNOSIS — Z9689 Presence of other specified functional implants: Secondary | ICD-10-CM

## 2022-04-05 DIAGNOSIS — Z85038 Personal history of other malignant neoplasm of large intestine: Secondary | ICD-10-CM

## 2022-04-05 HISTORY — DX: Essential (primary) hypertension: I10

## 2022-04-05 MED ORDER — TIZANIDINE HCL 4 MG PO TABS: 2.0000 mg | ORAL_TABLET | Freq: Three times a day (TID) | ORAL | 3 refills | Status: DC | PRN

## 2022-04-05 NOTE — Assessment & Plan Note (Signed)
Does appear to have a little nervousness and/or hyperactivity He is not currently on medications He reports GI sx easily when anxious or nervous - like with upcoming tournaments, but otherwise denies bothersome sx or need for meds or specialists

## 2022-04-05 NOTE — Assessment & Plan Note (Signed)
Noted many years ago he reports with ortho surgeries, not currently monitored by anyone He declines dexa f/up imaging due to his active lifestyle and he states he is not at risk of falling

## 2022-04-05 NOTE — Assessment & Plan Note (Signed)
Not needing to see any specialists currently - managing with tylenol, staying active and muscle relaxers prn
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
## 2022-04-05 NOTE — Assessment & Plan Note (Addendum)
Nighttime symptoms, pepcid at bedtime often managed the symptoms Reviewed SE of long term use of acid blocking meds including risk of osteoporosis Encouraged him to f/up if any worsening sx or need for more meds

## 2022-04-05 NOTE — Assessment & Plan Note (Signed)
UTD on his colonoscopy and monitoring with GI

## 2022-04-05 NOTE — Assessment & Plan Note (Signed)
In chart with urology management

## 2022-04-05 NOTE — Assessment & Plan Note (Signed)
eGFR 50-60 per chart review - looked over all values in chart with pt today in exam room Appears stable (labs in EMR back to 2014) Reviewed how to maintain renal function If any trending down of eGFR we will need to see [name] more often and start a tx plan For now continue avoiding NSAIDs, avoid nephrotoxic meds, stay hydrated (other HTN and DASH recommendations)  Lab Results  Component Value Date   CREATININE 1.40 (H) 02/12/2022   Lab Results  Component Value Date   BUN 17 02/12/2022    Lab Results  Component Value Date   GFRNONAA 53 (L) 02/12/2022   GFRNONAA 52 (L) 07/26/2021   GFRNONAA >60 01/23/2021   GFRNONAA >60 07/18/2020   GFRNONAA >60 01/15/2020   GFRNONAA >60 07/17/2019   GFRNONAA >60 01/16/2019   GFRNONAA 56 (L) 07/18/2018   GFRNONAA 58 (L) 07/06/2018   GFRNONAA >60 01/17/2018

## 2022-04-05 NOTE — Assessment & Plan Note (Signed)
Pt denies any hx of HTN or need for meds Bp is acceptable today for age, and upon further review there are many times over the years of high BP readings Pt is not on meds, managing with diet/lifestyle Reviewed ways to manage BP and CKD - avoid NSAIDs, low salt, healthy diet/lifestyle/exercise BP Readings from Last 10 Encounters:  04/05/22 134/76  02/19/22 (!) 150/80  10/23/21 120/72  09/28/21 (!) 181/107  08/28/21 (!) 155/90  08/02/21 133/82  07/31/21 (!) 164/80  06/16/21 122/73  02/01/21 (!) 145/82  11/30/20 (!) 142/78

## 2022-04-05 NOTE — Assessment & Plan Note (Signed)
Zanaflex refilled today Taking prn and usually only at night We reviewed SE of meds - mainly sedation Explained that we will need to see him once a year to review meds, document Dx and reason for meds and to be able to keep sending in refills  Pt is using responsibly, previously on narcotic pain meds, no concerns today - refills ordered

## 2022-04-05 NOTE — Assessment & Plan Note (Signed)
Not on meds, managed with coping skills Doing well    04/05/2022    9:07 AM 03/23/2022    2:27 PM 02/27/2022    9:56 AM  Depression screen PHQ 2/9  Decreased Interest 0 0 0  Down, Depressed, Hopeless 0 0 0  PHQ - 2 Score 0 0 0  Altered sleeping 0 0 0  Tired, decreased energy 0 0 0  Change in appetite 0 0 0  Feeling bad or failure about yourself  0 0 0  Trouble concentrating 0 0 0  Moving slowly or fidgety/restless 0 0 0  Suicidal thoughts 0 0 0  PHQ-9 Score 0 0 0  Difficult doing work/chores Not difficult at all Not difficult at all Not difficult at all

## 2022-04-05 NOTE — Assessment & Plan Note (Signed)
He manages with Dr. B at the cancer center, is seen twice a year, labs reviewed, has been stable

## 2022-08-13 ENCOUNTER — Inpatient Hospital Stay: Payer: Medicare Other | Attending: Internal Medicine

## 2022-08-13 DIAGNOSIS — N182 Chronic kidney disease, stage 2 (mild): Secondary | ICD-10-CM | POA: Diagnosis not present

## 2022-08-13 DIAGNOSIS — D472 Monoclonal gammopathy: Secondary | ICD-10-CM | POA: Diagnosis not present

## 2022-08-13 DIAGNOSIS — Z9221 Personal history of antineoplastic chemotherapy: Secondary | ICD-10-CM | POA: Diagnosis not present

## 2022-08-13 LAB — CBC WITH DIFFERENTIAL/PLATELET
Abs Immature Granulocytes: 0.02 10*3/uL (ref 0.00–0.07)
Basophils Absolute: 0 10*3/uL (ref 0.0–0.1)
Basophils Relative: 1 %
Eosinophils Absolute: 0.3 10*3/uL (ref 0.0–0.5)
Eosinophils Relative: 4 %
HCT: 43.8 % (ref 39.0–52.0)
Hemoglobin: 15.1 g/dL (ref 13.0–17.0)
Immature Granulocytes: 0 %
Lymphocytes Relative: 34 %
Lymphs Abs: 2.4 10*3/uL (ref 0.7–4.0)
MCH: 32.7 pg (ref 26.0–34.0)
MCHC: 34.5 g/dL (ref 30.0–36.0)
MCV: 94.8 fL (ref 80.0–100.0)
Monocytes Absolute: 0.6 10*3/uL (ref 0.1–1.0)
Monocytes Relative: 8 %
Neutro Abs: 3.8 10*3/uL (ref 1.7–7.7)
Neutrophils Relative %: 53 %
Platelets: 397 10*3/uL (ref 150–400)
RBC: 4.62 MIL/uL (ref 4.22–5.81)
RDW: 12 % (ref 11.5–15.5)
WBC: 7 10*3/uL (ref 4.0–10.5)
nRBC: 0 % (ref 0.0–0.2)

## 2022-08-13 LAB — COMPREHENSIVE METABOLIC PANEL
ALT: 17 U/L (ref 0–44)
AST: 22 U/L (ref 15–41)
Albumin: 4.3 g/dL (ref 3.5–5.0)
Alkaline Phosphatase: 61 U/L (ref 38–126)
Anion gap: 7 (ref 5–15)
BUN: 15 mg/dL (ref 8–23)
CO2: 27 mmol/L (ref 22–32)
Calcium: 9.6 mg/dL (ref 8.9–10.3)
Chloride: 102 mmol/L (ref 98–111)
Creatinine, Ser: 1.15 mg/dL (ref 0.61–1.24)
GFR, Estimated: >60 mL/min (ref >60)
Glucose, Bld: 107 mg/dL — ABNORMAL HIGH (ref 70–99)
Potassium: 4.4 mmol/L (ref 3.5–5.1)
Sodium: 136 mmol/L (ref 135–145)
Total Bilirubin: 0.5 mg/dL (ref 0.3–1.2)
Total Protein: 7.5 g/dL (ref 6.5–8.1)

## 2022-08-14 LAB — KAPPA/LAMBDA LIGHT CHAINS
Kappa free light chain: 75.2 mg/L — ABNORMAL HIGH (ref 3.3–19.4)
Kappa, lambda light chain ratio: 4.76 — ABNORMAL HIGH (ref 0.26–1.65)
Lambda free light chains: 15.8 mg/L (ref 5.7–26.3)

## 2022-08-16 LAB — MULTIPLE MYELOMA PANEL, SERUM
Albumin SerPl Elph-Mcnc: 3.9 g/dL (ref 2.9–4.4)
Albumin/Glob SerPl: 1.6 (ref 0.7–1.7)
Alpha 1: 0.2 g/dL (ref 0.0–0.4)
Alpha2 Glob SerPl Elph-Mcnc: 0.6 g/dL (ref 0.4–1.0)
B-Globulin SerPl Elph-Mcnc: 0.9 g/dL (ref 0.7–1.3)
Gamma Glob SerPl Elph-Mcnc: 0.9 g/dL (ref 0.4–1.8)
Globulin, Total: 2.6 g/dL (ref 2.2–3.9)
IgA: 359 mg/dL (ref 61–437)
IgG (Immunoglobin G), Serum: 836 mg/dL (ref 603–1613)
IgM (Immunoglobulin M), Srm: 54 mg/dL (ref 15–143)
M Protein SerPl Elph-Mcnc: 0.4 g/dL — ABNORMAL HIGH
Total Protein ELP: 6.5 g/dL (ref 6.0–8.5)

## 2022-08-20 ENCOUNTER — Encounter: Payer: Self-pay | Admitting: Internal Medicine

## 2022-08-20 ENCOUNTER — Inpatient Hospital Stay: Payer: Medicare Other | Admitting: Internal Medicine

## 2022-08-20 VITALS — BP 169/84 | HR 53 | Temp 97.1°F | Ht 67.0 in | Wt 134.2 lb

## 2022-08-20 DIAGNOSIS — D472 Monoclonal gammopathy: Secondary | ICD-10-CM | POA: Diagnosis not present

## 2022-08-20 NOTE — Progress Notes (Signed)
No concerns today 

## 2022-08-20 NOTE — Assessment & Plan Note (Addendum)
#   SMOLDERING MULTIPLE MYELOMA [2013 s/p BMBx; PLan currently [place]]. No clinical evidence of progression.  Clinically Stable. JULY 2024- M protein 0.4 g/dL; kappa lambda light chain ratio ~4.7 -overall clinically stable. Do not suspect any clinical progression of multiple myeloma.   # CKD stage II/III discussed the potential causes; check BP at home. s/p evaluation with Dr.Stoioff-PSA June 2023 2.0 on Flomax; stable.  # Arthritis- recommend HOLD off NSAIDS; ok to take tylenol prn as needed. Stable.   # DISPOSITION:  # follow up in 6 months- 1 week prior labs-MD-cbc/cmp/MM panel/K-L light chains- -Dr.B

## 2022-08-20 NOTE — Progress Notes (Signed)
[facility] OFFICE PROGRESS NOTE  Patient Care Team: Danelle Berry, PA-C as PCP - General (Family Medicine) Earna Coder, MD as Consulting Physician (Internal Medicine)   Cancer Staging  No matching staging information was found for the patient.    Oncology History Overview Note  # NOV 2013- SMOLDERING MULTIPLE MYELOMA [IgA Kappa; BMBx- 10-12% plasma cell;Dr.Trillo [place]]   # 2001- COLON CA STAGE III [s/p chemo; [place]]  # colonoscopy- 2018/ Dr.Anna  # ? UTI/kidney stone [Dr.Stoiff]- s/p Anti-biotics. S/p removal   DIAGNOSIS: Smoldering myeloma  GOALS: Cure  CURRENT/MOST RECENT THERAPY: Surveillance    Smoldering multiple myeloma (SMM)   INTERVAL HISTORY: Ambulating independently.  With his wife.   [name] 72 y.o.  male pleasant patient above history of smoldering myeloma currently on surveillance is here for follow-up.  Patient continues to play golf/bowling.  Chronic mild back pain not any worse.  Has been using Tylenol no other NSAIDs.  Review of Systems  Constitutional:  Negative for chills, diaphoresis, fever, malaise/fatigue and weight loss.  HENT:  Negative for nosebleeds and sore throat.   Eyes:  Negative for double vision.  Respiratory:  Negative for cough, hemoptysis, sputum production, shortness of breath and wheezing.   Cardiovascular:  Negative for chest pain, palpitations, orthopnea and leg swelling.  Gastrointestinal:  Negative for abdominal pain, blood in stool, constipation, diarrhea, heartburn, melena, nausea and vomiting.  Genitourinary:  Negative for dysuria, frequency and urgency.  Musculoskeletal:  Positive for back pain and joint pain.  Skin: Negative.  Negative for itching and rash.  Neurological:  Negative for dizziness, tingling, focal weakness, weakness and headaches.  Endo/Heme/Allergies:  Does not bruise/bleed easily.  Psychiatric/Behavioral:  Negative for depression. The patient is not nervous/anxious and  does not have insomnia.       PAST MEDICAL HISTORY :  Past Medical History:  Diagnosis Date   Abnormal laboratory test    Sees Dr. Sherrlyn Hock   Acid reflux    Allergy bee venom   mainly as a child   Anxiety 12/08/1969   Arthritis    Asthma 12/08/1953   mainly as a child   Chronic back pain    Colon cancer (HCC) 11/1999   stage 3 s/p colon resection   Depression    H/O Clostridium difficile infection    History of chemotherapy    5-FU pump/leukovorin   History of kidney stones    Hypertension 04/05/2022   Hypothyroidism    NO MEDS NOW   Insomnia    Multiple myeloma (HCC) 12/2011   smoldering vs mgus   Nephrolithiasis 08/28/2017   Osteoporosis    Seasonal allergies     PAST SURGICAL HISTORY :   Past Surgical History:  Procedure Laterality Date   BACK SURGERY     COLON SURGERY  2001   resection, 2nd surgery for scar tissue removal   COLONOSCOPY     COLONOSCOPY WITH PROPOFOL N/A 03/26/2016   Procedure: COLONOSCOPY WITH PROPOFOL;  Surgeon: Christena Deem, MD;  Location: [facility] ENDOSCOPY;  Service: Endoscopy;  Laterality: N/A;   COLONOSCOPY WITH PROPOFOL N/A 06/16/2021   Procedure: COLONOSCOPY WITH PROPOFOL;  Surgeon: Wyline Mood, MD;  Location: [facility] ENDOSCOPY;  Service: Gastroenterology;  Laterality: N/A;   CYSTOSCOPY WITH URETEROSCOPY AND STENT PLACEMENT Left 02/21/2018   Procedure: URETEROSCOPY, LASER LITHOTRIPSY, STONE REMOVAL AND STENT PLACEMENT;  Surgeon: Riki Altes, MD;  Location: [facility] ORS;  Service: Urology;  Laterality: Left;   CYSTOSCOPY/URETEROSCOPY/HOLMIUM LASER/STENT PLACEMENT Right 09/03/2017  Procedure: CYSTOSCOPY/URETEROSCOPY/HOLMIUM LASER/STENT PLACEMENT;  Surgeon: Riki Altes, MD;  Location: [facility] ORS;  Service: Urology;  Laterality: Right;   ESOPHAGOGASTRODUODENOSCOPY  10/2010   EYE SURGERY  1994 and 2010   [place]KA in 94 and [place] in 2010   FLEXIBLE SIGMOIDOSCOPY N/A 07/06/2018   Procedure: FLEXIBLE SIGMOIDOSCOPY;  Surgeon: Toledo, Boykin Nearing, MD;   Location: [facility] ENDOSCOPY;  Service: Gastroenterology;  Laterality: N/A;   HERNIA REPAIR  1970   lower left   HOLMIUM LASER APPLICATION Left 02/21/2018   Procedure: HOLMIUM LASER APPLICATION;  Surgeon: Riki Altes, MD;  Location: [facility] ORS;  Service: Urology;  Laterality: Left;   LUMBAR FUSION     L4-L5, L5-S1   SACROILIAC JOINT FUSION     SHOULDER SURGERY Left    SPINAL CORD STIMULATOR IMPLANT      FAMILY HISTORY :   Family History  Problem Relation Age of Onset   Lymphoma Mother    Arthritis Mother        deceased   Cancer Mother    Lung cancer Father    Alcohol abuse Father        deceased   Cancer Father    Heart disease Other        grandparents   Hyperlipidemia Sister    Diabetes Brother    Diabetes Son    Alcohol abuse Brother    Arthritis Brother    Depression Brother    Diabetes Brother    Alcohol abuse Sister    Arthritis Sister    Depression Sister    Alcohol abuse Son    Early death Son        truck accident 75 at age 22    SOCIAL HISTORY:   Social History   Tobacco Use   Smoking status: Former    Current packs/day: 0.00    Average packs/day: 1 pack/day for 10.0 years (10.0 ttl pk-yrs)    Types: Cigarettes    Start date: 03/06/1960    Quit date: 03/06/1970    Years since quitting: 52.4   Smokeless tobacco: Never   Tobacco comments:    started at age 80 and quit at age 24  Vaping Use   Vaping status: Never Used  Substance Use Topics   Alcohol use: No   Drug use: No    ALLERGIES:  is allergic to bee venom.  MEDICATIONS:  Current Outpatient Medications  Medication Sig Dispense Refill   acetaminophen (TYLENOL) 650 MG CR tablet Take 1,300 mg by mouth every 8 (eight) hours as needed for pain.     cholecalciferol (VITAMIN D3) 25 MCG (1000 UT) tablet Take 2,000 Units by mouth daily.      Multiple Vitamins-Minerals (CENTRUM SILVER ULTRA MENS) TABS Take 1 tablet by mouth daily.     tiZANidine (ZANAFLEX) 4 MG tablet Take 0.5-1.5 tablets (2-6 mg  total) by mouth every 8 (eight) hours as needed for muscle spasms (muscle tightness). 90 tablet 3   No current facility-administered medications for this visit.    PHYSICAL EXAMINATION: ECOG PERFORMANCE STATUS: 0 - Asymptomatic  BP (!) 169/84 (BP Location: Left Arm, Patient Position: Sitting, Cuff Size: Normal)   Pulse (!) 53   Temp (!) 97.1 F (36.2 C) (Tympanic)   Ht 5\' 7"  (1.702 m)   Wt 134 lb 3.2 oz (60.9 kg)   SpO2 100%   BMI 21.02 kg/m   Filed Weights   08/20/22 1032  Weight: 134 lb 3.2 oz (60.9 kg)     Physical  Exam HENT:     Head: Normocephalic and atraumatic.     Mouth/Throat:     Pharynx: No oropharyngeal exudate.  Eyes:     Pupils: Pupils are equal, round, and reactive to light.  Cardiovascular:     Rate and Rhythm: Normal rate and regular rhythm.  Pulmonary:     Effort: No respiratory distress.     Breath sounds: No wheezing.  Abdominal:     General: Bowel sounds are normal. There is no distension.     Palpations: Abdomen is soft. There is no mass.     Tenderness: There is no abdominal tenderness. There is no guarding or rebound.  Musculoskeletal:        General: No tenderness. Normal range of motion.     Cervical back: Normal range of motion and neck supple.  Skin:    General: Skin is warm.  Neurological:     Mental Status: He is alert and oriented to person, place, and time.  Psychiatric:        Mood and Affect: Affect normal.    LABORATORY DATA:  I have reviewed the data as listed    Component Value Date/Time   NA 136 08/13/2022 0952   K 4.4 08/13/2022 0952   CL 102 08/13/2022 0952   CO2 27 08/13/2022 0952   GLUCOSE 107 (H) 08/13/2022 0952   BUN 15 08/13/2022 0952   CREATININE 1.15 08/13/2022 0952   CREATININE 1.27 (H) 11/25/2017 1401   CALCIUM 9.6 08/13/2022 0952   CALCIUM 9.6 12/28/2013 1428   PROT 7.5 08/13/2022 0952   PROT 6.7 06/27/2012 1136   ALBUMIN 4.3 08/13/2022 0952   ALBUMIN 3.5 06/27/2012 1136   AST 22 08/13/2022 0952    AST 17 06/27/2012 1136   ALT 17 08/13/2022 0952   ALT 22 06/27/2012 1136   ALKPHOS 61 08/13/2022 0952   ALKPHOS 91 06/27/2012 1136   BILITOT 0.5 08/13/2022 0952   BILITOT 0.3 06/27/2012 1136   GFRNONAA >60 08/13/2022 0952   GFRNONAA 58 (L) 11/25/2017 1401   GFRAA >60 07/17/2019 0955   GFRAA 67 11/25/2017 1401    No results found for: "SPEP", "UPEP"  Lab Results  Component Value Date   WBC 7.0 08/13/2022   NEUTROABS 3.8 08/13/2022   HGB 15.1 08/13/2022   HCT 43.8 08/13/2022   MCV 94.8 08/13/2022   PLT 397 08/13/2022      Chemistry      Component Value Date/Time   NA 136 08/13/2022 0952   K 4.4 08/13/2022 0952   CL 102 08/13/2022 0952   CO2 27 08/13/2022 0952   BUN 15 08/13/2022 0952   CREATININE 1.15 08/13/2022 0952   CREATININE 1.27 (H) 11/25/2017 1401      Component Value Date/Time   CALCIUM 9.6 08/13/2022 0952   CALCIUM 9.6 12/28/2013 1428   ALKPHOS 61 08/13/2022 0952   ALKPHOS 91 06/27/2012 1136   AST 22 08/13/2022 0952   AST 17 06/27/2012 1136   ALT 17 08/13/2022 0952   ALT 22 06/27/2012 1136   BILITOT 0.5 08/13/2022 0952   BILITOT 0.3 06/27/2012 1136      RADIOGRAPHIC STUDIES: I have personally reviewed the radiological images as listed and agreed with the findings in the report. No results found.   ASSESSMENT & PLAN:  Smoldering multiple myeloma (SMM) (HCC) # SMOLDERING MULTIPLE MYELOMA [2013 s/p BMBx; PLan currently [place]]. No clinical evidence of progression.  Clinically Stable. JULY 2024- M protein 0.4 g/dL; kappa lambda light chain ratio ~  4.7 -overall clinically stable. Do not suspect any clinical progression of multiple myeloma.   # CKD stage II/III discussed the potential causes; check BP at home. s/p evaluation with Dr.Stoioff-PSA June 2023 2.0 on Flomax; stable.  # Arthritis- recommend HOLD off NSAIDS; ok to take tylenol prn as needed. Stable.   # DISPOSITION:  # follow up in 6 months- 1 week prior labs-MD-cbc/cmp/MM panel/K-L light  chains- -Dr.B   No orders of the defined types were placed in this encounter.  All questions were answered. The patient knows to call the clinic with any problems, questions or concerns.      Earna Coder, MD 08/20/2022 11:00 AM

## 2022-10-15 ENCOUNTER — Encounter: Payer: Self-pay | Admitting: Family Medicine

## 2022-10-15 ENCOUNTER — Ambulatory Visit: Payer: Self-pay

## 2022-10-15 ENCOUNTER — Telehealth (INDEPENDENT_AMBULATORY_CARE_PROVIDER_SITE_OTHER): Payer: Medicare Other | Admitting: Family Medicine

## 2022-10-15 DIAGNOSIS — R110 Nausea: Secondary | ICD-10-CM

## 2022-10-15 DIAGNOSIS — J069 Acute upper respiratory infection, unspecified: Secondary | ICD-10-CM

## 2022-10-15 DIAGNOSIS — U071 COVID-19: Secondary | ICD-10-CM | POA: Diagnosis not present

## 2022-10-15 MED ORDER — PROMETHAZINE HCL 12.5 MG PO TABS
12.5000 mg | ORAL_TABLET | Freq: Four times a day (QID) | ORAL | 0 refills | Status: DC | PRN
Start: 2022-10-15 — End: 2023-02-18

## 2022-10-15 MED ORDER — NIRMATRELVIR/RITONAVIR (PAXLOVID)TABLET
3.0000 | ORAL_TABLET | Freq: Two times a day (BID) | ORAL | 0 refills | Status: AC
Start: 2022-10-15 — End: 2022-10-20

## 2022-10-15 NOTE — Telephone Encounter (Signed)
Message from [place] T sent at 10/15/2022  8:09 AM EDT  Summary: positive covid   Patients spouse called sttd the patient tested positive for Covid, he is experiencing low grade fever, cough, achy body, pale and stuffy nose. Patient is requesting Paxlovid.         Chief Complaint: covid pos Symptoms: fever, pale, cough, stomachache, gen bosy aches, stuffy nose Frequency: Sunday  Pertinent Negatives: Patient denies SOB is at baseline per pt, chest pain  Disposition: [] ED /[] Urgent Care (no appt availability in office) / [x] Appointment(In office/virtual)/ []  [facility] [facility]/ [] Home Care/ [] Refused Recommended Disposition /[] [place] [facility]/ []  Follow-up with PCP Additional Notes: VV with PCP this am   Reason for Disposition  [1] HIGH RISK patient (e.g., weak immune system, age > 64 years, obesity with BMI 30 or higher, pregnant, chronic lung disease or other chronic medical condition) AND [2] COVID symptoms (e.g., cough, fever)  (Exceptions: Already seen by PCP and no new or worsening symptoms.)  Answer Assessment - Initial Assessment Questions 1. COVID-19 DIAGNOSIS: "How do you know that you have COVID?" (e.g., positive lab test or self-test, diagnosed by doctor or NP/PA, symptoms after exposure).     Self covid tes 2. COVID-19 EXPOSURE: "Was there any known exposure to COVID before the symptoms began?" CDC Definition of close contact: within 6 feet (2 meters) for a total of 15 minutes or more over a 24-hour period.      N/a 3. ONSET: "When did the COVID-19 symptoms start?"      yest 4. WORST SYMPTOM: "What is your worst symptom?" (e.g., cough, fever, shortness of breath, muscle aches)     *No Answer* 5. COUGH: "Do you have a cough?" If Yes, ask: "How bad is the cough?"       yes 6. FEVER: "Do you have a fever?" If Yes, ask: "What is your temperature, how was it measured, and when did it start?"     Felt subjective 7. RESPIRATORY STATUS: "Describe your breathing?" (e.g.,  normal; shortness of breath, wheezing, unable to speak)      SOB  8. BETTER-SAME-WORSE: "Are you getting better, staying the same or getting worse compared to yesterday?"  If getting worse, ask, "In what way?"     *No Answer* 9. OTHER SYMPTOMS: "Do you have any other symptoms?"  (e.g., chills, fatigue, headache, loss of smell or taste, muscle pain, sore throat)     Chills, pain to stomach, nauseated, unable to eat, pale stuffy nose, SOB unchanged- pain stomach: comes and goes started like yesterday   10. HIGH RISK DISEASE: "Do you have any chronic medical problems?" (e.g., asthma, heart or lung disease, weak immune system, obesity, etc.)       *No Answer* 11. VACCINE: "Have you had the COVID-19 vaccine?" If Yes, ask: "Which one, how many shots, when did you get it?"       N/a 12. PREGNANCY: "Is there any chance you are pregnant?" "When was your last menstrual period?"       N/a 13. O2 SATURATION MONITOR:  "Do you use an oxygen saturation monitor (pulse oximeter) at home?" If Yes, ask "What is your reading (oxygen level) today?" "What is your usual oxygen saturation reading?" (e.g., 95%)       *No Answer*  Protocols used: Coronavirus (COVID-19) Diagnosed or Suspected-A-AH

## 2022-10-15 NOTE — Progress Notes (Signed)
Name: [name]   MRN: [id number]    DOB: January 27, 1951   Date:10/15/2022       Progress Note  Subjective:    Chief Complaint  Chief Complaint  Patient presents with   Covid Positive    tested positive for Covid yesterday, he is experiencing low grade fever, cough, achy body, pale and stuffy nose. Patient is requesting Paxlovid.    I connected with  Emeterio Reeve  on 10/15/22 at 11:40 AM EDT by a video enabled telemedicine application and verified that I am speaking with the correct person using two identifiers.  I discussed the limitations of evaluation and management by telemedicine and the availability of in person appointments. The patient expressed understanding and agreed to proceed. Staff also discussed with the patient that there may be a patient responsible charge related to this service. Patient Location: home  Provider Location: [facility] clinic  Additional Individuals present: none   HPI Onset of sx yesterday and positive covid test yesterday body aches congestion, N/dry heaving, mild cough, fatigue tmax 99.3 but is taking tylenol Would like paxlovid  Last GFR >60 No sob/chest pain    Patient Active Problem List   Diagnosis Date Noted   Other chronic pain 04/05/2022   S/P fusion of sacroiliac joint 04/05/2022   History of lumbar spinal fusion 04/05/2022   Benign prostatic hyperplasia 04/05/2022   Multiple myeloma (HCC) 04/05/2022   Stage 3a chronic kidney disease (HCC) 04/05/2022   Hypertension 04/05/2022   Spinal cord stimulator status 03/03/2018   Major depressive disorder in partial remission (HCC) 03/03/2018   History of colon cancer 08/19/2017   OP (osteoporosis) 04/19/2017   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   Anxiety disorder 10/04/2014   Gastroesophageal reflux disease without esophagitis 10/04/2014    Social History   Tobacco Use   Smoking status: Former    Current packs/day: 0.00    Average packs/day: 1 pack/day for 10.0 years (10.0 ttl  pk-yrs)    Types: Cigarettes    Start date: 03/06/1960    Quit date: 03/06/1970    Years since quitting: 52.6   Smokeless tobacco: Never   Tobacco comments:    started at age 57 and quit at age 66  Substance Use Topics   Alcohol use: No     Current Outpatient Medications:    acetaminophen (TYLENOL) 650 MG CR tablet, Take 1,300 mg by mouth every 8 (eight) hours as needed for pain., Disp: , Rfl:    cholecalciferol (VITAMIN D3) 25 MCG (1000 UT) tablet, Take 2,000 Units by mouth daily. , Disp: , Rfl:    Multiple Vitamins-Minerals (CENTRUM SILVER ULTRA MENS) TABS, Take 1 tablet by mouth daily., Disp: , Rfl:    tiZANidine (ZANAFLEX) 4 MG tablet, Take 0.5-1.5 tablets (2-6 mg total) by mouth every 8 (eight) hours as needed for muscle spasms (muscle tightness)., Disp: 90 tablet, Rfl: 3  Allergies  Allergen Reactions   Bee Venom Swelling    I personally reviewed active problem list, medication list, allergies, family history, social history, health maintenance, notes from last encounter, lab results, imaging with the patient/caregiver today.   Review of Systems  Constitutional: Negative.   HENT: Negative.    Eyes: Negative.   Respiratory: Negative.    Cardiovascular: Negative.   Gastrointestinal: Negative.   Endocrine: Negative.   Genitourinary: Negative.   Musculoskeletal: Negative.   Skin: Negative.   Allergic/Immunologic: Negative.   Neurological: Negative.   Hematological: Negative.   Psychiatric/Behavioral: Negative.  All other systems reviewed and are negative.     Objective:   Virtual encounter, vitals limited, only able to obtain the following There were no vitals filed for this visit. There is no height or weight on file to calculate BMI. Nursing Note and Vital Signs reviewed.  Physical Exam Vitals and nursing note reviewed.  Constitutional:      Appearance: Normal appearance. He is normal weight.  HENT:     Head: Normocephalic and atraumatic.  Pulmonary:      Effort: No respiratory distress.  Psychiatric:        Mood and Affect: Mood normal.     PE limited by virtual encounter  No results found for this or any previous visit (from the past 72 hour(s)).  Assessment and Plan:     ICD-10-CM   1. Upper respiratory tract infection due to COVID-19 virus  U07.1 nirmatrelvir/ritonavir (PAXLOVID) 20 x 150 MG & 10 x 100MG  TABS   J06.9     2. Nausea  R11.0 promethazine (PHENERGAN) 12.5 MG tablet       -Red flags and when to present for emergency care or RTC including chest pain, shortness of breath, new/worsening/un-resolving symptoms, reviewed with patient at time of visit. Follow up and care instructions discussed and provided in AVS. - I discussed the assessment and treatment plan with the patient. The patient was provided an opportunity to ask questions and all were answered. The patient agreed with the plan and demonstrated an understanding of the instructions.  I provided 15 minutes of non-face-to-face time during this encounter.  Danelle Berry, PA-C 10/15/22 11:51 AM

## 2023-02-11 ENCOUNTER — Inpatient Hospital Stay: Payer: Medicare Other | Attending: Internal Medicine

## 2023-02-11 DIAGNOSIS — Z801 Family history of malignant neoplasm of trachea, bronchus and lung: Secondary | ICD-10-CM | POA: Insufficient documentation

## 2023-02-11 DIAGNOSIS — N182 Chronic kidney disease, stage 2 (mild): Secondary | ICD-10-CM | POA: Diagnosis not present

## 2023-02-11 DIAGNOSIS — D472 Monoclonal gammopathy: Secondary | ICD-10-CM | POA: Insufficient documentation

## 2023-02-11 LAB — CBC WITH DIFFERENTIAL (CANCER CENTER ONLY)
Abs Immature Granulocytes: 0.03 10*3/uL (ref 0.00–0.07)
Basophils Absolute: 0 10*3/uL (ref 0.0–0.1)
Basophils Relative: 0 %
Eosinophils Absolute: 0.6 10*3/uL — ABNORMAL HIGH (ref 0.0–0.5)
Eosinophils Relative: 6 %
HCT: 43.4 % (ref 39.0–52.0)
Hemoglobin: 15.1 g/dL (ref 13.0–17.0)
Immature Granulocytes: 0 %
Lymphocytes Relative: 28 %
Lymphs Abs: 2.5 10*3/uL (ref 0.7–4.0)
MCH: 32.7 pg (ref 26.0–34.0)
MCHC: 34.8 g/dL (ref 30.0–36.0)
MCV: 93.9 fL (ref 80.0–100.0)
Monocytes Absolute: 0.6 10*3/uL (ref 0.1–1.0)
Monocytes Relative: 7 %
Neutro Abs: 5.4 10*3/uL (ref 1.7–7.7)
Neutrophils Relative %: 59 %
Platelet Count: 382 10*3/uL (ref 150–400)
RBC: 4.62 MIL/uL (ref 4.22–5.81)
RDW: 11.8 % (ref 11.5–15.5)
WBC Count: 9.2 10*3/uL (ref 4.0–10.5)
nRBC: 0 % (ref 0.0–0.2)

## 2023-02-11 LAB — CMP (CANCER CENTER ONLY)
ALT: 15 U/L (ref 0–44)
AST: 19 U/L (ref 15–41)
Albumin: 4.4 g/dL (ref 3.5–5.0)
Alkaline Phosphatase: 66 U/L (ref 38–126)
Anion gap: 8 (ref 5–15)
BUN: 17 mg/dL (ref 8–23)
CO2: 28 mmol/L (ref 22–32)
Calcium: 9.7 mg/dL (ref 8.9–10.3)
Chloride: 101 mmol/L (ref 98–111)
Creatinine: 1.06 mg/dL (ref 0.61–1.24)
GFR, Estimated: >60 mL/min (ref >60)
Glucose, Bld: 107 mg/dL — ABNORMAL HIGH (ref 70–99)
Potassium: 4 mmol/L (ref 3.5–5.1)
Sodium: 137 mmol/L (ref 135–145)
Total Bilirubin: 0.7 mg/dL (ref 0.0–1.2)
Total Protein: 7.3 g/dL (ref 6.5–8.1)

## 2023-02-12 LAB — KAPPA/LAMBDA LIGHT CHAINS
Kappa free light chain: 74.8 mg/L — ABNORMAL HIGH (ref 3.3–19.4)
Kappa, lambda light chain ratio: 4.86 — ABNORMAL HIGH (ref 0.26–1.65)
Lambda free light chains: 15.4 mg/L (ref 5.7–26.3)

## 2023-02-18 ENCOUNTER — Inpatient Hospital Stay: Payer: Medicare Other | Admitting: Internal Medicine

## 2023-02-18 ENCOUNTER — Encounter: Payer: Self-pay | Admitting: Internal Medicine

## 2023-02-18 DIAGNOSIS — D472 Monoclonal gammopathy: Secondary | ICD-10-CM

## 2023-02-18 LAB — MULTIPLE MYELOMA PANEL, SERUM
Albumin SerPl Elph-Mcnc: 4.2 g/dL (ref 2.9–4.4)
Albumin/Glob SerPl: 1.6 (ref 0.7–1.7)
Alpha 1: 0.2 g/dL (ref 0.0–0.4)
Alpha2 Glob SerPl Elph-Mcnc: 0.6 g/dL (ref 0.4–1.0)
B-Globulin SerPl Elph-Mcnc: 1 g/dL (ref 0.7–1.3)
Gamma Glob SerPl Elph-Mcnc: 0.9 g/dL (ref 0.4–1.8)
Globulin, Total: 2.7 g/dL (ref 2.2–3.9)
IgA: 365 mg/dL (ref 61–437)
IgG (Immunoglobin G), Serum: 894 mg/dL (ref 603–1613)
IgM (Immunoglobulin M), Srm: 50 mg/dL (ref 15–143)
M Protein SerPl Elph-Mcnc: 0.3 g/dL — ABNORMAL HIGH
Total Protein ELP: 6.9 g/dL (ref 6.0–8.5)

## 2023-02-18 NOTE — Progress Notes (Signed)
 [facility] OFFICE PROGRESS NOTE  Patient Care Team: Leavy Mole, PA-C as PCP - General (Family Medicine) Rennie Cindy SAUNDERS, MD as Consulting Physician (Internal Medicine)   Cancer Staging  No matching staging information was found for the patient.    Oncology History Overview Note  # NOV 2013- SMOLDERING MULTIPLE MYELOMA [IgA Kappa; BMBx- 10-12% plasma cell;Dr.Trillo [place]]   # 2001- COLON CA STAGE III [s/p chemo; [place]]  # colonoscopy- 2018/ Dr.Anna  # ? UTI/kidney stone [Dr.Stoiff]- s/p Anti-biotics. S/p removal   DIAGNOSIS: Smoldering myeloma  GOALS: Cure  CURRENT/MOST RECENT THERAPY: Surveillance    Smoldering multiple myeloma (SMM)   INTERVAL HISTORY: Ambulating independently.  With his wife.   [name] 73 y.o.  male pleasant patient above history of smoldering myeloma currently on surveillance is here for follow-up.  Complains of back pain- chronic not any worse. Patient continues to play golf/bowling.    Has been using Tylenol  no other NSAIDs.  Review of Systems  Constitutional:  Negative for chills, diaphoresis, fever, malaise/fatigue and weight loss.  HENT:  Negative for nosebleeds and sore throat.   Eyes:  Negative for double vision.  Respiratory:  Negative for cough, hemoptysis, sputum production, shortness of breath and wheezing.   Cardiovascular:  Negative for chest pain, palpitations, orthopnea and leg swelling.  Gastrointestinal:  Negative for abdominal pain, blood in stool, constipation, diarrhea, heartburn, melena, nausea and vomiting.  Genitourinary:  Negative for dysuria, frequency and urgency.  Musculoskeletal:  Positive for back pain and joint pain.  Skin: Negative.  Negative for itching and rash.  Neurological:  Negative for dizziness, tingling, focal weakness, weakness and headaches.  Endo/Heme/Allergies:  Does not bruise/bleed easily.  Psychiatric/Behavioral:  Negative for depression. The patient is not  nervous/anxious and does not have insomnia.       PAST MEDICAL HISTORY :  Past Medical History:  Diagnosis Date   Abnormal laboratory test    Sees Dr. Marina   Acid reflux    Allergy bee venom   mainly as a child   Anxiety 12/08/1969   Arthritis    Asthma 12/08/1953   mainly as a child   Chronic back pain    Colon cancer (HCC) 11/1999   stage 3 s/p colon resection   Depression    H/O Clostridium difficile infection    History of chemotherapy    5-FU pump/leukovorin   History of kidney stones    Hypertension 04/05/2022   Hypothyroidism    NO MEDS NOW   Insomnia    Multiple myeloma (HCC) 12/2011   smoldering vs mgus   Nephrolithiasis 08/28/2017   Osteoporosis    Seasonal allergies     PAST SURGICAL HISTORY :   Past Surgical History:  Procedure Laterality Date   BACK SURGERY     COLON SURGERY  2001   resection, 2nd surgery for scar tissue removal   COLONOSCOPY     COLONOSCOPY WITH PROPOFOL  N/A 03/26/2016   Procedure: COLONOSCOPY WITH PROPOFOL ;  Surgeon: Gladis RAYMOND Mariner, MD;  Location: [facility] ENDOSCOPY;  Service: Endoscopy;  Laterality: N/A;   COLONOSCOPY WITH PROPOFOL  N/A 06/16/2021   Procedure: COLONOSCOPY WITH PROPOFOL ;  Surgeon: Therisa Bi, MD;  Location: [facility] ENDOSCOPY;  Service: Gastroenterology;  Laterality: N/A;   CYSTOSCOPY WITH URETEROSCOPY AND STENT PLACEMENT Left 02/21/2018   Procedure: URETEROSCOPY, LASER LITHOTRIPSY, STONE REMOVAL AND STENT PLACEMENT;  Surgeon: Twylla Glendia BROCKS, MD;  Location: [facility] ORS;  Service: Urology;  Laterality: Left;   CYSTOSCOPY/URETEROSCOPY/HOLMIUM LASER/STENT PLACEMENT Right  09/03/2017   Procedure: CYSTOSCOPY/URETEROSCOPY/HOLMIUM LASER/STENT PLACEMENT;  Surgeon: Twylla Glendia BROCKS, MD;  Location: [facility] ORS;  Service: Urology;  Laterality: Right;   ESOPHAGOGASTRODUODENOSCOPY  10/2010   EYE SURGERY  1994 and 2010   [place]KA in 94 and [place] in 2010   FLEXIBLE SIGMOIDOSCOPY N/A 07/06/2018   Procedure: FLEXIBLE SIGMOIDOSCOPY;  Surgeon: Tol[place],  Ladell POUR, MD;  Location: [facility] ENDOSCOPY;  Service: Gastroenterology;  Laterality: N/A;   HERNIA REPAIR  1970   lower left   HOLMIUM LASER APPLICATION Left 02/21/2018   Procedure: HOLMIUM LASER APPLICATION;  Surgeon: Twylla Glendia BROCKS, MD;  Location: [facility] ORS;  Service: Urology;  Laterality: Left;   LUMBAR FUSION     L4-L5, L5-S1   SACROILIAC JOINT FUSION     SHOULDER SURGERY Left    SPINAL CORD STIMULATOR IMPLANT      FAMILY HISTORY :   Family History  Problem Relation Age of Onset   Lymphoma Mother    Arthritis Mother        deceased   Cancer Mother    Lung cancer Father    Alcohol abuse Father        deceased   Cancer Father    Heart disease Other        grandparents   Hyperlipidemia Sister    Diabetes Brother    Diabetes Son    Alcohol abuse Brother    Arthritis Brother    Depression Brother    Diabetes Brother    Alcohol abuse Sister    Arthritis Sister    Depression Sister    Alcohol abuse Son    Early death Son        truck accident 23 at age 66    SOCIAL HISTORY:   Social History   Tobacco Use   Smoking status: Former    Current packs/day: 0.00    Average packs/day: 1 pack/day for 10.0 years (10.0 ttl pk-yrs)    Types: Cigarettes    Start date: 03/06/1960    Quit date: 03/06/1970    Years since quitting: 52.9   Smokeless tobacco: Never   Tobacco comments:    started at age 67 and quit at age 57  Vaping Use   Vaping status: Never Used  Substance Use Topics   Alcohol use: No   Drug use: No    ALLERGIES:  is allergic to bee venom.  MEDICATIONS:  Current Outpatient Medications  Medication Sig Dispense Refill   acetaminophen  (TYLENOL ) 650 MG CR tablet Take 1,300 mg by mouth every 8 (eight) hours as needed for pain.     cholecalciferol  (VITAMIN D3) 25 MCG (1000 UT) tablet Take 2,000 Units by mouth daily.      Multiple Vitamins-Minerals (CENTRUM SILVER ULTRA MENS) TABS Take 1 tablet by mouth daily.     tiZANidine  (ZANAFLEX ) 4 MG tablet Take 0.5-1.5  tablets (2-6 mg total) by mouth every 8 (eight) hours as needed for muscle spasms (muscle tightness). 90 tablet 3   No current facility-administered medications for this visit.    PHYSICAL EXAMINATION: ECOG PERFORMANCE STATUS: 0 - Asymptomatic  BP (!) 145/86 (BP Location: Left Arm, Patient Position: Sitting, Cuff Size: Normal)   Pulse 63   Temp 97.6 F (36.4 C) (Tympanic)   Ht 5' 7 (1.702 m)   Wt 134 lb 9.6 oz (61.1 kg)   SpO2 100%   BMI 21.08 kg/m   Filed Weights   02/18/23 0948  Weight: 134 lb 9.6 oz (61.1 kg)  Physical Exam HENT:     Head: Normocephalic and atraumatic.     Mouth/Throat:     Pharynx: No oropharyngeal exudate.  Eyes:     Pupils: Pupils are equal, round, and reactive to light.  Cardiovascular:     Rate and Rhythm: Normal rate and regular rhythm.  Pulmonary:     Effort: No respiratory distress.     Breath sounds: No wheezing.  Abdominal:     General: Bowel sounds are normal. There is no distension.     Palpations: Abdomen is soft. There is no mass.     Tenderness: There is no abdominal tenderness. There is no guarding or rebound.  Musculoskeletal:        General: No tenderness. Normal range of motion.     Cervical back: Normal range of motion and neck supple.  Skin:    General: Skin is warm.  Neurological:     Mental Status: He is alert and oriented to person, place, and time.  Psychiatric:        Mood and Affect: Affect normal.    LABORATORY DATA:  I have reviewed the data as listed    Component Value Date/Time   NA 137 02/11/2023 0953   K 4.0 02/11/2023 0953   CL 101 02/11/2023 0953   CO2 28 02/11/2023 0953   GLUCOSE 107 (H) 02/11/2023 0953   BUN 17 02/11/2023 0953   CREATININE 1.06 02/11/2023 0953   CREATININE 1.27 (H) 11/25/2017 1401   CALCIUM  9.7 02/11/2023 0953   CALCIUM  9.6 12/28/2013 1428   PROT 7.3 02/11/2023 0953   PROT 6.7 06/27/2012 1136   ALBUMIN 4.4 02/11/2023 0953   ALBUMIN 3.5 06/27/2012 1136   AST 19 02/11/2023  0953   ALT 15 02/11/2023 0953   ALT 22 06/27/2012 1136   ALKPHOS 66 02/11/2023 0953   ALKPHOS 91 06/27/2012 1136   BILITOT 0.7 02/11/2023 0953   GFRNONAA >60 02/11/2023 0953   GFRNONAA 58 (L) 11/25/2017 1401   GFRAA >60 07/17/2019 0955   GFRAA 67 11/25/2017 1401    No results found for: SPEP, UPEP  Lab Results  Component Value Date   WBC 9.2 02/11/2023   NEUTROABS 5.4 02/11/2023   HGB 15.1 02/11/2023   HCT 43.4 02/11/2023   MCV 93.9 02/11/2023   PLT 382 02/11/2023      Chemistry      Component Value Date/Time   NA 137 02/11/2023 0953   K 4.0 02/11/2023 0953   CL 101 02/11/2023 0953   CO2 28 02/11/2023 0953   BUN 17 02/11/2023 0953   CREATININE 1.06 02/11/2023 0953   CREATININE 1.27 (H) 11/25/2017 1401      Component Value Date/Time   CALCIUM  9.7 02/11/2023 0953   CALCIUM  9.6 12/28/2013 1428   ALKPHOS 66 02/11/2023 0953   ALKPHOS 91 06/27/2012 1136   AST 19 02/11/2023 0953   ALT 15 02/11/2023 0953   ALT 22 06/27/2012 1136   BILITOT 0.7 02/11/2023 0953      RADIOGRAPHIC STUDIES: I have personally reviewed the radiological images as listed and agreed with the findings in the report. No results found.   ASSESSMENT & PLAN:  Smoldering multiple myeloma (SMM) (HCC) # SMOLDERING MULTIPLE MYELOMA [2013 s/p BMBx; PLan currently [place] ]. No clinical evidence of progression.  Clinically Stable. JULY 2024- M protein 0.4 g/dL; JAN 7974-ezwipwh; JAN 2025-kappa lambda light chain ratio ~4.8- overall clinically stable. Do not suspect any clinical progression of multiple myeloma. stable.  # CKD stage II/III discussed  the potential causes; check BP at home. s/p evaluation with Dr.Stoioff-PSA June 2023 2.0 on Flomax ; stable.  # Arthritis- recommend HOLD off NSAIDS; ok to take tylenol  prn as needed. stable.   # DISPOSITION:  # follow up in 6 months- 1 week prior labs-MD-cbc/cmp/MM panel/K-L light chains- -Dr.B   Orders Placed This Encounter  Procedures   CBC with  Differential (Cancer Center Only)    Standing Status:   Future    Expected Date:   08/19/2023    Expiration Date:   02/18/2024   CMP (Cancer Center only)    Standing Status:   Future    Expected Date:   08/19/2023    Expiration Date:   02/18/2024   Kappa/lambda light chains    Standing Status:   Future    Expected Date:   08/19/2023    Expiration Date:   02/18/2024   Multiple Myeloma Panel (SPEP&IFE w/QIG)    Standing Status:   Future    Expected Date:   08/19/2023    Expiration Date:   02/18/2024   All questions were answered. The patient knows to call the clinic with any problems, questions or concerns.      Cindy JONELLE Joe, MD 02/18/2023 10:30 AM

## 2023-02-18 NOTE — Assessment & Plan Note (Addendum)
#   SMOLDERING MULTIPLE MYELOMA [2013 s/p BMBx; PLan currently [place] ]. No clinical evidence of progression.  Clinically Stable. JULY 2024- M protein 0.4 g/dL; JAN 7974-ezwipwh; JAN 2025-kappa lambda light chain ratio ~4.8- overall clinically stable. Do not suspect any clinical progression of multiple myeloma. stable.  # CKD stage II/III discussed the potential causes; check BP at home. s/p evaluation with Dr.Stoioff-PSA June 2023 2.0 on Flomax ; stable.  # Arthritis- recommend HOLD off NSAIDS; ok to take tylenol  prn as needed. stable.   # DISPOSITION:  # follow up in 6 months- 1 week prior labs-MD-cbc/cmp/MM panel/K-L light chains- -Dr.B

## 2023-02-18 NOTE — Progress Notes (Signed)
 Back pain 6/10, due to past back surgeries.

## 2023-03-06 ENCOUNTER — Ambulatory Visit (INDEPENDENT_AMBULATORY_CARE_PROVIDER_SITE_OTHER): Payer: Medicare Other

## 2023-03-06 DIAGNOSIS — Z23 Encounter for immunization: Secondary | ICD-10-CM

## 2023-04-05 ENCOUNTER — Ambulatory Visit: Payer: Self-pay | Admitting: Family Medicine

## 2023-05-14 ENCOUNTER — Encounter: Payer: Self-pay | Admitting: Internal Medicine

## 2023-05-14 ENCOUNTER — Telehealth (INDEPENDENT_AMBULATORY_CARE_PROVIDER_SITE_OTHER): Admitting: Internal Medicine

## 2023-05-14 ENCOUNTER — Ambulatory Visit: Payer: Self-pay

## 2023-05-14 VITALS — Ht 67.0 in

## 2023-05-14 DIAGNOSIS — J309 Allergic rhinitis, unspecified: Secondary | ICD-10-CM

## 2023-05-14 DIAGNOSIS — R0981 Nasal congestion: Secondary | ICD-10-CM

## 2023-05-14 DIAGNOSIS — R051 Acute cough: Secondary | ICD-10-CM

## 2023-05-14 MED ORDER — BENZONATATE 100 MG PO CAPS: 100.0000 mg | ORAL_CAPSULE | Freq: Two times a day (BID) | ORAL | 0 refills | Status: DC | PRN

## 2023-05-14 NOTE — Telephone Encounter (Signed)
  Chief Complaint: sinus pressure/congestion Symptoms: moderate to severe sinus pressure, yellow to brown mucus, productive cough, sore throat Frequency: x 4 days Pertinent Negatives: Patient denies earaches, SOB. Disposition: [] ED /[] Urgent Care (no appt availability in office) / [x] Appointment(In office/virtual)/ []  [facility]/ [] Home Care/ [] Refused Recommended Disposition /[] [facility]/ []  Follow-up with PCP Additional Notes: Patient states he took home Flu/COVID test and it was negative. Patient states he has been taking Claritin, cough drops, extra strength Tylenol daily since Saturday when symptoms came on. No available appts with PCP. Patient states he would prefer a virtual visit, scheduled with Dr Caralee Ates.  Copied from CRM [id number]. Topic: Clinical - Red Word Triage >> May 14, 2023  8:09 AM Nyra Capes wrote: Red Word that prompted transfer to Nurse Triage: patient calling in,  sinus pressure, cough, mucus yellow/brown, low grade fever that comes and goes,  Patient phone #     [phone] Reason for Disposition  [1] SEVERE pain AND [2] not improved 2 hours after pain medicine  Answer Assessment - Initial Assessment Questions 1. LOCATION: "Where does it hurt?"      Nose, "sinus areas".  2. ONSET: "When did the sinus pain start?"  (e.g., hours, days)      Saturday 05/11/23.  3. SEVERITY: "How bad is the pain?"   (Scale 1-10; mild, moderate or severe)   - MILD (1-3): doesn't interfere with normal activities    - MODERATE (4-7): interferes with normal activities (e.g., work or school) or awakens from sleep   - SEVERE (8-10): excruciating pain and patient unable to do any normal activities        Moderate to severe pressure.  4. RECURRENT SYMPTOM: "Have you ever had sinus problems before?" If Yes, ask: "When was the last time?" and "What happened that time?"      Yes, he states it has been a while.  5. NASAL CONGESTION: "Is the nose blocked?" If Yes, ask: "Can  you open it or must you breathe through your mouth?"     Yes, and he states it makes it hard to sleep breathing through mouth.  6. NASAL DISCHARGE: "Do you have discharge from your nose?" If so ask, "What color?"     Yes. Clear mucus.  7. FEVER: "Do you have a fever?" If Yes, ask: "What is it, how was it measured, and when did it start?"      No. He states highest was 99.  8. OTHER SYMPTOMS: "Do you have any other symptoms?" (e.g., sore throat, cough, earache, difficulty breathing)     Productive cough with yellow to brown mucus, sore throat  9. PREGNANCY: "Is there any chance you are pregnant?" "When was your last menstrual period?"     N/A.  Protocols used: Sinus Pain or Congestion-A-AH

## 2023-05-14 NOTE — Progress Notes (Signed)
 Virtual Visit via Video Note  I connected with [name] on 05/14/23 at  8:40 AM EDT by a video enabled telemedicine application and verified that I am speaking with the correct person using two identifiers.  Location: Patient: Home Provider: [facility]   I discussed the limitations of evaluation and management by telemedicine and the availability of in person appointments. The patient expressed understanding and agreed to proceed.  History of Present Illness:  Discussed the use of AI scribe software for clinical note transcription with the patient, who gave verbal consent to proceed.  History of Present Illness The patient, with a history of controlled hypertension, presents with sinus congestion and a cough that began on Saturday. He has been taking Claritin without relief. He describes the congestion as severe, preventing him from breathing through his nose, especially at night. This has led to mouth breathing, which has irritated his throat and exacerbated his cough. The cough is so severe that it wakes him up hourly at night, leading to poor sleep. He has been using cough drops to soothe his throat and help with the cough. He also reports a low-grade fever that fluctuates around the normal range. He has had some yellow-brown sputum with the cough, but his nasal discharge is clear. He reports sinus pressure in his nose and face, but denies any ear pain or pressure. He has no other respiratory symptoms such as shortness of breath or wheezing. He has tested negative for COVID-19 and the flu at home.    Observations/Objective:  General: well appearing, no acute distress ENT: conjunctiva normal appearing bilaterally, congested   Skin: no rashes, cyanosis or abnormal bruising noted Neuro: answers all questions appropriately   Assessment and Plan:  Assessment & Plan Allergic Rhinitis   Acute allergic rhinitis likely due to seasonal pollen exposure. Claritin ineffective, suggesting need for  stronger antihistamine. Negative COVID-19 and influenza tests support allergic etiology. Further evaluation if symptoms persist or worsen within a week to rule out bacterial infection.   - Switch to Zyrtec, Allegra, or Xyzal with a decongestant (e.g., Allegra D or Zyrtec D).   - Use antihistamine nasal spray such as AstroPro or Azastelin.   - Continue nasal saline.   - Educated on antihistamine side effects.   - Monitor symptoms and report if he worsens or does not improve within a week.    Cough   Cough likely due to post-nasal drip from allergic rhinitis. Tessalon Perles discussed for nighttime use. Persistent or worsening symptoms may indicate secondary bacterial infection.   - Prescribe Tessalon Perles for nighttime cough suppression.   - Monitor cough and report if it persists or worsens.    Follow-up   Monitor symptom progression and response to treatment. Persistent symptoms beyond 10-14 days may indicate bacterial sinus infection.   - Contact clinic if symptoms do not improve or worsen within a week.   - Consider antibiotics if symptoms persist beyond 10-14 days.    Follow Up Instructions: PRN    I discussed the assessment and treatment plan with the patient. The patient was provided an opportunity to ask questions and all were answered. The patient agreed with the plan and demonstrated an understanding of the instructions.   The patient was advised to call back or seek an in-person evaluation if the symptoms worsen or if the condition fails to improve as anticipated.  I provided 10 minutes of non-face-to-face time during this encounter.   Margarita Mail, DO

## 2023-05-22 ENCOUNTER — Other Ambulatory Visit: Payer: Self-pay

## 2023-05-22 DIAGNOSIS — Z87442 Personal history of urinary calculi: Secondary | ICD-10-CM

## 2023-05-23 ENCOUNTER — Ambulatory Visit: Admitting: Urology

## 2023-05-23 VITALS — BP 172/87 | HR 68

## 2023-05-23 DIAGNOSIS — Z125 Encounter for screening for malignant neoplasm of prostate: Secondary | ICD-10-CM

## 2023-05-23 DIAGNOSIS — Z87442 Personal history of urinary calculi: Secondary | ICD-10-CM | POA: Diagnosis not present

## 2023-05-23 DIAGNOSIS — R399 Unspecified symptoms and signs involving the genitourinary system: Secondary | ICD-10-CM | POA: Diagnosis not present

## 2023-05-23 DIAGNOSIS — R1032 Left lower quadrant pain: Secondary | ICD-10-CM | POA: Diagnosis not present

## 2023-05-23 LAB — URINALYSIS, COMPLETE
Bilirubin, UA: NEGATIVE
Glucose, UA: NEGATIVE
Ketones, UA: NEGATIVE
Nitrite, UA: NEGATIVE
Protein,UA: NEGATIVE
RBC, UA: NEGATIVE
Specific Gravity, UA: 1.025 (ref 1.005–1.030)
Urobilinogen, Ur: 0.2 mg/dL (ref 0.2–1.0)
pH, UA: 6 (ref 5.0–7.5)

## 2023-05-23 LAB — MICROSCOPIC EXAMINATION

## 2023-05-23 MED ORDER — ALFUZOSIN HCL ER 10 MG PO TB24: 10.0000 mg | ORAL_TABLET | Freq: Every day | ORAL | 11 refills | Status: DC

## 2023-05-23 NOTE — Patient Instructions (Signed)
 Urodynamic Testing: What to Expect  Urodynamic tests are done to look at how well your bladder and urethra are working. The urethra is the part of your body that drains pee (urine) from the bladder. When your kidneys clean your blood, pee is made and stored in your bladder until you feel the urge to go. Peeing requires the nerves and muscles of your bladder and urethra to communicate. This helps you to: Start peeing when your bladder is full. Empty your bladder completely. Control the flow of your pee. Why do I need urodynamic testing? You may need urodynamic testing to help find the cause of any of these problems: Leaking pee (incontinence). Problems starting or stopping the flow of your pee. Pain when peeing. Urinary tract infections that happen often. Not being able to empty your bladder completely. Having strong urges to pee. Having a weak flow of pee. What are the risks? Your health care provider will talk with you about risks. These may include: Soreness. Urge to pee often. Bleeding. Infection. Allergies to medicines or dyes used during the tests. What happens before? Ask about changing or stopping: Any medicines you take. Any vitamins, herbs, or supplements you take. Do not take aspirin or ibuprofen unless you're told to. You may be asked to avoid peeing before the test so that you arrive with a full bladder. Tell a provider about: Any allergies you have. All medicines you are taking. These include vitamins, herbs, eye drops, creams, and over-the-counter medicines. Whether you are pregnant or may be pregnant. What happens during urodynamic testing? You may have more than one urodynamic test. The tests may be done on different days or may all be done on one day. You may be given antibiotics before or after the tests to help prevent infection. You may be given a numbing gel in the urethra before the tests. For your test, your provider may do the  following: Uroflowmetry. This test measures how much you pee and how long it takes. Postvoid residual urine measurement. This test measures how much pee is left in your bladder right after you pee. Cystometric test. This test measures the pressure in your bladder before you pee and as you pee. Leak point pressure measurement. This test may be done during the cystometric test. It measures: The pressure in the bladder when leaking happens. The strength of the bladder muscles. Pressure flow study. This test will show the pressure in the bladder when you feel you have to go and how fast the pee comes out. Electromyogram. This test measures the action of the nerves and muscles of your bladder at the opening of your urethra. Video urodynamic tests. This test uses X-ray or ultrasound machines to take videos or pictures of the bladder while it's filling and emptying. What can I expect after the test? You should be able to go home right away and do your usual activities. You may be told to drink a glass of water every 30 minutes for the first 2 hours after the test. Taking a warm bath or using warm, wet cloths may relieve any soreness near your urethra. Ask when your test results will be ready and how to get them. Talk with your provider about what your results mean. Contact a health care provider if: You have pain. You have blood in your pee. You have a fever or chills. This information is not intended to replace advice given to you by your health care provider. Make sure you discuss any questions you  have with your health care provider. Document Revised: 09/28/2022 Document Reviewed: 09/28/2022 Elsevier Patient Education  2024 [employer].

## 2023-05-23 NOTE — Progress Notes (Signed)
   05/23/2023 10:17 AM   [name] [birth year] [id number]  Reason for visit: Urinary symptoms, history of kidney stones, PSA screening  HPI: 73 year old male who was previously followed by Dr. Cherylene Corrente for the above issues, and requested a second opinion.  He has a history of recurrent nephrolithiasis that has required lithotripsy and ureteroscopy in the past, he did not do well with shockwave lithotripsy.  His primary issue today is urinary symptoms with urgency, frequency, sensation of incomplete emptying, weak stream, postvoid dribbling, nocturia 3-4 times at night.  He did not have improvement on Flomax or Gemtesa previously, he underwent cystoscopy with Dr. Cherylene Corrente in August 2023 that reportedly showed no significant prostate enlargement, normal bladder, no trabeculations.  He recommended considering urodynamics or pelvic floor physical therapy.  He ultimately did have a few visits with pelvic floor physical therapy that he did not feel were helpful.  He also feels he has been passing some stones in the urine over the last few years.  He denies any flank pain, dysuria, or gross hematuria.  He is very bothered by his urinary symptoms and interested in other treatment options.  Urinalysis today 6-10 WBC, 0-2 RBC, trace leukocytes, nitrite negative, few bacteria, will send for culture and atypicals.  PVR 0ml.  PSA was normal at 2.0 from June 2023.  We discussed possible causes of his urinary symptoms including overactive bladder, BPH, infection/prostatitis, nephrolithiasis, pelvic floor dysfunction.  Using shared decision making he was interested in a CT to evaluate for stone burden or possible distal ureteral stones as cause of his symptoms in addition to a trial of alfuzosin.  I recommended considering urodynamics if CT is benign.  -Follow-up urine cultures -Trial of alfuzosin -CT for further evaluation of possible kidney stones -Consider urodynamics in the future if CT benign, could consider  TUIP in the future   Christopher Pressman, MD  [facility] [address] [place], [place] [address] [phone]

## 2023-05-28 ENCOUNTER — Telehealth: Admitting: Family Medicine

## 2023-05-29 LAB — CULTURE, URINE COMPREHENSIVE

## 2023-05-30 ENCOUNTER — Ambulatory Visit: Payer: Self-pay

## 2023-05-30 DIAGNOSIS — Z0000 Encounter for general adult medical examination without abnormal findings: Secondary | ICD-10-CM

## 2023-05-30 DIAGNOSIS — N410 Acute prostatitis: Secondary | ICD-10-CM

## 2023-05-30 LAB — MYCOPLASMA / UREAPLASMA CULTURE
Mycoplasma hominis Culture: NEGATIVE
Ureaplasma urealyticum: NEGATIVE

## 2023-05-30 MED ORDER — SULFAMETHOXAZOLE-TRIMETHOPRIM 800-160 MG PO TABS: 1.0000 | ORAL_TABLET | Freq: Two times a day (BID) | ORAL | 0 refills | Status: AC

## 2023-05-30 NOTE — Progress Notes (Signed)
 Subjective:   [name] is a 73 y.o. who presents for a Medicare Wellness preventive visit.  Visit Complete: Virtual I connected with  [name] on 05/30/23 by a audio enabled telemedicine application and verified that I am speaking with the correct person using two identifiers.  Patient Location: Home  Provider Location: Home Office  I discussed the limitations of evaluation and management by telemedicine. The patient expressed understanding and agreed to proceed.  Vital Signs: Because this visit was a virtual/telehealth visit, some criteria may be missing or patient reported. Any vitals not documented were not able to be obtained and vitals that have been documented are patient reported.  VideoDeclined- This patient declined Librarian, academic. Therefore the visit was completed with audio only.  Persons Participating in Visit: Patient.  AWV Questionnaire: No: Patient Medicare AWV questionnaire was not completed prior to this visit.  Cardiac Risk Factors include: advanced age (>66men, >28 women);hypertension;male gender     Objective:    Today's Vitals   05/30/23 1517  PainSc: 4    There is no height or weight on file to calculate BMI.     05/30/2023    3:26 PM 08/20/2022   10:33 AM 03/23/2022    2:31 PM 02/19/2022   10:30 AM 11/08/2021    9:07 AM 08/02/2021    1:43 PM 06/16/2021    7:37 AM  Advanced Directives  Does Patient Have a Medical Advance Directive? No No Yes Yes Yes No Yes  Type of Advance Directive   Living will;Healthcare Power of Attorney Living will;Healthcare Power of Attorney   Living will;Healthcare Power of Attorney  Copy of Healthcare Power of Attorney in Chart?   No - copy requested    No - copy requested  Would patient like information on creating a medical advance directive? No - Patient declined No - Patient declined    No - Patient declined     Current Medications (verified) Outpatient Encounter Medications as of  05/30/2023  Medication Sig   acetaminophen  (TYLENOL ) 650 MG CR tablet Take 1,300 mg by mouth every 8 (eight) hours as needed for pain.   alfuzosin  (UROXATRAL ) 10 MG 24 hr tablet Take 1 tablet (10 mg total) by mouth daily with breakfast.   cholecalciferol (VITAMIN D3) 25 MCG (1000 UT) tablet Take 2,000 Units by mouth daily.    Multiple Vitamins-Minerals (CENTRUM SILVER ULTRA MENS) TABS Take 1 tablet by mouth daily.   sulfamethoxazole -trimethoprim  (BACTRIM  DS) 800-160 MG tablet Take 1 tablet by mouth 2 (two) times daily for 7 days.   tiZANidine  (ZANAFLEX ) 4 MG tablet Take 0.5-1.5 tablets (2-6 mg total) by mouth every 8 (eight) hours as needed for muscle spasms (muscle tightness).   benzonatate  (TESSALON ) 100 MG capsule Take 1 capsule (100 mg total) by mouth 2 (two) times daily as needed for cough. (Patient not taking: Reported on 05/30/2023)   No facility-administered encounter medications on file as of 05/30/2023.    Allergies (verified) Bee venom   History: Past Medical History:  Diagnosis Date   Abnormal laboratory test    Sees Dr. Thurston Flow   Acid reflux    Allergy bee venom   mainly as a child   Anxiety 12/08/1969   Arthritis    Asthma 12/08/1953   mainly as a child   Chronic back pain    Colon cancer (HCC) 11/1999   stage 3 s/p colon resection   Depression    H/O Clostridium difficile infection  History of chemotherapy    5-FU pump/leukovorin   History of kidney stones    Hypertension 04/05/2022   Hypothyroidism    NO MEDS NOW   Insomnia    Multiple myeloma (HCC) 12/2011   smoldering vs mgus   Nephrolithiasis 08/28/2017   Osteoporosis    Seasonal allergies    Past Surgical History:  Procedure Laterality Date   BACK SURGERY     COLON SURGERY  2001   resection, 2nd surgery for scar tissue removal   COLONOSCOPY     COLONOSCOPY WITH PROPOFOL  N/A 03/26/2016   Procedure: COLONOSCOPY WITH PROPOFOL ;  Surgeon: Deveron Fly, MD;  Location: [facility] ENDOSCOPY;  Service:  Endoscopy;  Laterality: N/A;   COLONOSCOPY WITH PROPOFOL  N/A 06/16/2021   Procedure: COLONOSCOPY WITH PROPOFOL ;  Surgeon: Luke Salaam, MD;  Location: [facility] ENDOSCOPY;  Service: Gastroenterology;  Laterality: N/A;   CYSTOSCOPY WITH URETEROSCOPY AND STENT PLACEMENT Left 02/21/2018   Procedure: URETEROSCOPY, LASER LITHOTRIPSY, STONE REMOVAL AND STENT PLACEMENT;  Surgeon: Geraline Knapp, MD;  Location: [facility] ORS;  Service: Urology;  Laterality: Left;   CYSTOSCOPY/URETEROSCOPY/HOLMIUM LASER/STENT PLACEMENT Right 09/03/2017   Procedure: CYSTOSCOPY/URETEROSCOPY/HOLMIUM LASER/STENT PLACEMENT;  Surgeon: Geraline Knapp, MD;  Location: [facility] ORS;  Service: Urology;  Laterality: Right;   ESOPHAGOGASTRODUODENOSCOPY  10/2010   EYE SURGERY  1994 and 2010   RKA in 94 and [place] in 2010   FLEXIBLE SIGMOIDOSCOPY N/A 07/06/2018   Procedure: FLEXIBLE SIGMOIDOSCOPY;  Surgeon: Toledo, Alphonsus Jeans, MD;  Location: [facility] ENDOSCOPY;  Service: Gastroenterology;  Laterality: N/A;   HERNIA REPAIR  1970   lower left   HOLMIUM LASER APPLICATION Left 02/21/2018   Procedure: HOLMIUM LASER APPLICATION;  Surgeon: Geraline Knapp, MD;  Location: [facility] ORS;  Service: Urology;  Laterality: Left;   LUMBAR FUSION     L4-L5, L5-S1   SACROILIAC JOINT FUSION     SHOULDER SURGERY Left    SPINAL CORD STIMULATOR IMPLANT     Family History  Problem Relation Age of Onset   Lymphoma Mother    Arthritis Mother        deceased   Cancer Mother    Lung cancer Father    Alcohol abuse Father        deceased   Cancer Father    Heart disease Other        grandparents   Hyperlipidemia Sister    Diabetes Brother    Diabetes Son    Alcohol abuse Brother    Arthritis Brother    Depression Brother    Diabetes Brother    Alcohol abuse Sister    Arthritis Sister    Depression Sister    Alcohol abuse Son    Early death Son        truck accident 5 at age 26   Social History   Socioeconomic History   Marital status: Married    Spouse  name: Not on file   Number of children: 2   Years of education: Not on file   Highest education level: 12th grade  Occupational History   Occupation: Retired  Tobacco Use   Smoking status: Former    Current packs/day: 0.00    Average packs/day: 1 pack/day for 10.0 years (10.0 ttl pk-yrs)    Types: Cigarettes    Start date: 03/06/1960    Quit date: 03/06/1970    Years since quitting: 53.2   Smokeless tobacco: Never   Tobacco comments:    started at age 31 and quit at age  19  Vaping Use   Vaping status: Never Used  Substance and Sexual Activity   Alcohol use: No   Drug use: No   Sexual activity: Yes    Birth control/protection: None  Other Topics Concern   Not on file  Social History Narrative   Pt is an avid golfer and bowls every week   Social Drivers of Corporate investment banker Strain: Low Risk  (05/30/2023)   Overall Financial Resource Strain (CARDIA)    Difficulty of Paying Living Expenses: Not hard at all  Food Insecurity: No Food Insecurity (05/30/2023)   Hunger Vital Sign    Worried About Running Out of Food in the Last Year: Never true    Ran Out of Food in the Last Year: Never true  Transportation Needs: No Transportation Needs (05/30/2023)   PRAPARE - Administrator, Civil Service (Medical): No    Lack of Transportation (Non-Medical): No  Physical Activity: Sufficiently Active (05/30/2023)   Exercise Vital Sign    Days of Exercise per Week: 3 days    Minutes of Exercise per Session: 60 min  Stress: No Stress Concern Present (05/30/2023)   [employer] of Occupational Health - Occupational Stress Questionnaire    Feeling of Stress : Not at all  Social Connections: Moderately Integrated (05/30/2023)   Social Connection and Isolation Panel [NHANES]    Frequency of Communication with Friends and Family: More than three times a week    Frequency of Social Gatherings with Friends and Family: Three times a week    Attends Religious Services: Never     Active Member of Clubs or Organizations: Yes    Attends Engineer, structural: More than 4 times per year    Marital Status: Married    Tobacco Counseling Counseling given: Not Answered Tobacco comments: started at age 21 and quit at age 17    Clinical Intake:  Pre-visit preparation completed: Yes  Pain : 0-10 Pain Score: 4  Pain Type: Chronic pain Pain Location: Back Pain Orientation: Lower Pain Descriptors / Indicators: Aching, Discomfort, Constant Pain Onset: More than a month ago Pain Frequency: Constant Pain Relieving Factors: TYLENOL  QAM- JUST LIVE W/ IT  Pain Relieving Factors: TYLENOL  QAM- JUST LIVE W/ IT  BMI - recorded: 21 Nutritional Status: BMI of 19-24  Normal Nutritional Risks: None Diabetes: No  No results found for: "HGBA1C"   How often do you need to have someone help you when you read instructions, pamphlets, or other written materials from your doctor or pharmacy?: 1 - Never  Interpreter Needed?: No  Information entered by :: Dellie Fergusson, LPN   Activities of Daily Living    05/30/2023    3:27 PM 05/26/2023   11:37 AM  In your present state of health, do you have any difficulty performing the following activities:  Hearing? 1 1  Vision? 0 0  Difficulty concentrating or making decisions? 0 0  Walking or climbing stairs? 0 0  Dressing or bathing? 0 0  Doing errands, shopping? 0 0  Preparing Food and eating ? N N  Using the Toilet? N N  In the past six months, have you accidently leaked urine? Y Y  Do you have problems with loss of bowel control? N N  Managing your Medications? N N  Managing your Finances? N N  Housekeeping or managing your Housekeeping? N N    Patient Care Team: Tapia, Leisa, PA-C as PCP - General (Family Medicine) Brahmanday, Govinda R,  MD as Consulting Physician (Internal Medicine)  Indicate any recent Medical Services you may have received from other than [facility] providers in the past year (date may be  approximate).     Assessment:   This is a routine wellness examination for [name].  Hearing/Vision screen Hearing Screening - Comments:: WEARS AIDS AT HOME Vision Screening - Comments:: READERS-   Goals Addressed             This Visit's Progress    DIET - EAT MORE FRUITS AND VEGETABLES         Depression Screen     05/30/2023    3:23 PM 10/15/2022   11:19 AM 04/05/2022    9:07 AM 03/23/2022    2:27 PM 02/27/2022    9:56 AM 10/23/2021   11:01 AM 03/16/2021    2:53 PM  PHQ 2/9 Scores  PHQ - 2 Score 1 0 0 0 0 0 0  PHQ- 9 Score 1 0 0 0 0      Fall Risk     05/30/2023    3:27 PM 05/26/2023   11:37 AM 10/15/2022   11:19 AM 04/05/2022    9:07 AM 03/23/2022    2:22 PM  Fall Risk   Falls in the past year? 0 0 0 0 0  Number falls in past yr: 0 0 0 0 0  Injury with Fall? 0 0 0 0 0  Risk for fall due to : No Fall Risks  No Fall Risks No Fall Risks No Fall Risks  Follow up Falls prevention discussed;Falls evaluation completed  Falls prevention discussed;Education provided;Falls evaluation completed Falls prevention discussed;Education provided;Falls evaluation completed Education provided;Falls prevention discussed    MEDICARE RISK AT HOME:  Medicare Risk at Home Any stairs in or around the home?: Yes If so, are there any without handrails?: No Home free of loose throw rugs in walkways, pet beds, electrical cords, etc?: Yes Adequate lighting in your home to reduce risk of falls?: Yes Life alert?: No Use of a cane, walker or w/c?: No Grab bars in the bathroom?: Yes Shower chair or bench in shower?: No Elevated toilet seat or a handicapped toilet?: No  TIMED UP AND GO:  Was the test performed?  No  Cognitive Function: 6CIT completed        05/30/2023    3:28 PM 03/23/2022    2:32 PM 01/10/2018   10:28 AM 01/09/2016    3:19 PM  6CIT Screen  What Year? 0 points 0 points 0 points 0 points  What month? 0 points 0 points 0 points 0 points  What time? 0 points 0 points 0 points  0 points  Count back from 20 0 points 0 points 0 points 0 points  Months in reverse 0 points 0 points 0 points 0 points  Repeat phrase 2 points 0 points 0 points 2 points  Total Score 2 points 0 points 0 points 2 points    Immunizations Immunization History  Administered Date(s) Administered   Fluad Quad(high Dose 65+) 11/14/2018, 11/02/2019, 04/05/2022   Fluad Trivalent(High Dose 65+) 03/06/2023   Influenza, High Dose Seasonal PF 11/04/2014, 01/26/2016, 11/07/2016, 12/24/2017, 12/06/2020   Influenza-Unspecified 01/26/2016   PFIZER(Purple Top)SARS-COV-2 Vaccination 02/26/2019, 03/19/2019, 11/06/2019   Pneumococcal Conjugate-13 01/09/2016   Pneumococcal Polysaccharide-23 01/09/2017   Tdap 07/06/2012    Screening Tests Health Maintenance  Topic Date Due   DEXA SCAN  Never done   Zoster Vaccines- Shingrix (1 of 2) Never done   DTaP/Tdap/Td (2 -  Td or Tdap) 07/07/2022   COVID-19 Vaccine (4 - 2024-25 season) 10/07/2022   INFLUENZA VACCINE  09/06/2023   Medicare Annual Wellness (AWV)  05/29/2024   Colonoscopy  06/17/2026   Pneumonia Vaccine 63+ Years old  Completed   Hepatitis C Screening  Completed   HPV VACCINES  Aged Out   Meningococcal B Vaccine  Aged Out    Health Maintenance  Health Maintenance Due  Topic Date Due   DEXA SCAN  Never done   Zoster Vaccines- Shingrix (1 of 2) Never done   DTaP/Tdap/Td (2 - Td or Tdap) 07/07/2022   COVID-19 Vaccine (4 - 2024-25 season) 10/07/2022   Health Maintenance Items Addressed: UP TO DATE EXCEPT PNA, WANTS NO MORE COVID SHOTS; UP TO DATE ON COLONOSCOPY  Additional Screening:  Vision Screening: Recommended annual ophthalmology exams for early detection of glaucoma and other disorders of the eye.  Dental Screening: Recommended annual dental exams for proper oral hygiene  Community Resource Referral / Chronic Care Management: CRR required this visit?  No   CCM required this visit?  No     Plan:     I have personally  reviewed and noted the following in the patient's chart:   Medical and social history Use of alcohol, tobacco or illicit drugs  Current medications and supplements including opioid prescriptions. Patient is not currently taking opioid prescriptions. Functional ability and status Nutritional status Physical activity Advanced directives List of other physicians Hospitalizations, surgeries, and ER visits in previous 12 months Vitals Screenings to include cognitive, depression, and falls Referrals and appointments  In addition, I have reviewed and discussed with patient certain preventive protocols, quality metrics, and best practice recommendations. A written personalized care plan for preventive services as well as general preventive health recommendations were provided to patient.     Pinky Bright, LPN   0/[id number]   After Visit Summary: (MyChart) Due to this being a telephonic visit, the after visit summary with patients personalized plan was offered to patient via MyChart   Notes: Nothing significant to report at this time.

## 2023-05-30 NOTE — Patient Instructions (Addendum)
 [name] , Thank you for taking time to come for your Medicare Wellness Visit. I appreciate your ongoing commitment to your health goals. Please review the following plan we discussed and let me know if I can assist you in the future.   Referrals/Orders/Follow-Ups/Clinician Recommendations: NONE  This is a list of the screening recommended for you and due dates:  Health Maintenance  Topic Date Due   DEXA scan (bone density measurement)  Never done   Zoster (Shingles) Vaccine (1 of 2) Never done   DTaP/Tdap/Td vaccine (2 - Td or Tdap) 07/07/2022   COVID-19 Vaccine (4 - 2024-25 season) 10/07/2022   Flu Shot  09/06/2023   Medicare Annual Wellness Visit  05/29/2024   Colon Cancer Screening  06/17/2026   Pneumonia Vaccine  Completed   Hepatitis C Screening  Completed   HPV Vaccine  Aged Out   Meningitis B Vaccine  Aged Out    Advanced directives: (ACP Link)Information on Advanced Care Planning can be found at [employer] Advance Health Care Directives Advance Health Care Directives. [email or web address]   Next Medicare Annual Wellness Visit scheduled for next year: Yes  06/11/24 @ 3:50 PM BY PHONE

## 2023-05-31 ENCOUNTER — Ambulatory Visit
Admission: RE | Admit: 2023-05-31 | Discharge: 2023-05-31 | Disposition: A | Discharge status: Home / Self Care | Source: Ambulatory Visit | Attending: Urology | Admitting: Urology

## 2023-05-31 DIAGNOSIS — R1032 Left lower quadrant pain: Secondary | ICD-10-CM | POA: Diagnosis present

## 2023-06-10 DIAGNOSIS — N3941 Urge incontinence: Secondary | ICD-10-CM

## 2023-06-10 DIAGNOSIS — R399 Unspecified symptoms and signs involving the genitourinary system: Secondary | ICD-10-CM

## 2023-06-10 NOTE — Telephone Encounter (Signed)
 Referral placed.

## 2023-07-03 ENCOUNTER — Encounter: Payer: Self-pay | Admitting: Internal Medicine

## 2023-07-25 ENCOUNTER — Ambulatory Visit: Admitting: Urology

## 2023-07-25 VITALS — BP 170/70 | HR 89 | Ht 67.0 in | Wt 132.0 lb

## 2023-07-25 DIAGNOSIS — N3281 Overactive bladder: Secondary | ICD-10-CM | POA: Diagnosis not present

## 2023-07-25 DIAGNOSIS — R399 Unspecified symptoms and signs involving the genitourinary system: Secondary | ICD-10-CM

## 2023-07-25 MED ORDER — OXYBUTYNIN CHLORIDE ER 10 MG PO TB24: 10.0000 mg | ORAL_TABLET | Freq: Every day | ORAL | 11 refills | Status: DC

## 2023-07-25 NOTE — Patient Instructions (Signed)

## 2023-07-25 NOTE — Progress Notes (Signed)
   07/25/2023 4:01 PM   [name] November 13, 1950 [id number]  Reason for visit: Follow up urinary symptoms, ED, nephrolithiasis, PSA screening  HPI: 73 year old male with long history of bothersome urinary symptoms.  Previously was followed by Dr. Cherylene Corrente and required a second opinion, and I met him for the first time in April 2025.  He has a history of nephrolithiasis treated with ureteroscopy and shockwave lithotripsy, did not do well with shockwave lithotripsy.  His primary urinary complaints are urgency, frequency, sensation of incomplete emptying despite normal PVRs, weak stream, postvoid dribbling, and nocturia 3-4 times at night.  He has not had improvement on Flomax , alfuzosin , or Gemtesa  previously.  Cystoscopy with Dr. Cherylene Corrente in August 2023 showed no significant prostate enlargement, normal bladder, no trabeculations.  He also tried pelvic floor physical therapy which she did not feel was helpful.  PSA was normal at 2 in June 2023  Urinalysis previously showed mild pyuria and grew a small amount of Staph epidermidis, treated with antibiotics with no change in urinary symptoms.  Atypical cultures were negative.  I personally viewed and interpreted the CT scan from 05/31/2023 showing no acute abnormality, 47 g prostate, decompressed bladder, no hydronephrosis, small nonobstructing left-sided renal stones.  He underwent urodynamics at [facility] urology for further evaluation of his urinary symptoms.  This showed a max capacity of 190 mL.  First sensation was at 88 mL.  There was instability, and unstable contractions.  He was able to void with a max flow 4 mL/s, max detrusor pressure was 19 cm of water.  No reflux was seen.  We had another long conversation today about his urinary symptoms, and the challenges between differentiating between OAB and BPH, as well as pelvic floor dysfunction.  From his clinical history and urodynamics, I think his issue is more small capacity bladder and overactive  bladder.  He was interested in a trial of oxybutynin  and risks and benefits were discussed, also counseled to decrease his soda and tea intake.  Could also consider PTNS or even Botox in the future.  If no improvement with OAB treatments could consider TUIP or UroLift, but history more consistent with OAB over BPH.  He also had some questions about ED.  He is not bothered enough to consider medications at this time but will reconsider and discuss at follow-up.  Trial of oxybutynin  10 mg XL daily, avoid bladder irritants RTC 6 weeks symptom check and PVR  [name], MD  [facility] [address], [address] [place], [place] [address] [phone]

## 2023-08-12 ENCOUNTER — Inpatient Hospital Stay: Payer: Medicare Other | Attending: Internal Medicine

## 2023-08-12 DIAGNOSIS — N182 Chronic kidney disease, stage 2 (mild): Secondary | ICD-10-CM | POA: Insufficient documentation

## 2023-08-12 DIAGNOSIS — Z91030 Bee allergy status: Secondary | ICD-10-CM | POA: Diagnosis not present

## 2023-08-12 DIAGNOSIS — Z85038 Personal history of other malignant neoplasm of large intestine: Secondary | ICD-10-CM | POA: Insufficient documentation

## 2023-08-12 DIAGNOSIS — Z9221 Personal history of antineoplastic chemotherapy: Secondary | ICD-10-CM | POA: Insufficient documentation

## 2023-08-12 DIAGNOSIS — C9000 Multiple myeloma not having achieved remission: Secondary | ICD-10-CM | POA: Diagnosis present

## 2023-08-12 DIAGNOSIS — I129 Hypertensive chronic kidney disease with stage 1 through stage 4 chronic kidney disease, or unspecified chronic kidney disease: Secondary | ICD-10-CM | POA: Insufficient documentation

## 2023-08-12 DIAGNOSIS — Z87891 Personal history of nicotine dependence: Secondary | ICD-10-CM | POA: Insufficient documentation

## 2023-08-12 DIAGNOSIS — D472 Monoclonal gammopathy: Secondary | ICD-10-CM

## 2023-08-12 LAB — CBC WITH DIFFERENTIAL (CANCER CENTER ONLY)
Abs Immature Granulocytes: 0.06 K/uL (ref 0.00–0.07)
Basophils Absolute: 0 K/uL (ref 0.0–0.1)
Basophils Relative: 0 %
Eosinophils Absolute: 0.3 K/uL (ref 0.0–0.5)
Eosinophils Relative: 3 %
HCT: 43.9 % (ref 39.0–52.0)
Hemoglobin: 15.1 g/dL (ref 13.0–17.0)
Immature Granulocytes: 1 %
Lymphocytes Relative: 21 %
Lymphs Abs: 2.3 K/uL (ref 0.7–4.0)
MCH: 31.9 pg (ref 26.0–34.0)
MCHC: 34.4 g/dL (ref 30.0–36.0)
MCV: 92.6 fL (ref 80.0–100.0)
Monocytes Absolute: 0.8 K/uL (ref 0.1–1.0)
Monocytes Relative: 7 %
Neutro Abs: 7.8 K/uL — ABNORMAL HIGH (ref 1.7–7.7)
Neutrophils Relative %: 68 %
Platelet Count: 371 K/uL (ref 150–400)
RBC: 4.74 MIL/uL (ref 4.22–5.81)
RDW: 11.9 % (ref 11.5–15.5)
WBC Count: 11.2 K/uL — ABNORMAL HIGH (ref 4.0–10.5)
nRBC: 0 % (ref 0.0–0.2)

## 2023-08-12 LAB — CMP (CANCER CENTER ONLY)
ALT: 15 U/L (ref 0–44)
AST: 20 U/L (ref 15–41)
Albumin: 4.3 g/dL (ref 3.5–5.0)
Alkaline Phosphatase: 75 U/L (ref 38–126)
Anion gap: 8 (ref 5–15)
BUN: 19 mg/dL (ref 8–23)
CO2: 25 mmol/L (ref 22–32)
Calcium: 9.6 mg/dL (ref 8.9–10.3)
Chloride: 103 mmol/L (ref 98–111)
Creatinine: 1.33 mg/dL — ABNORMAL HIGH (ref 0.61–1.24)
GFR, Estimated: 56 mL/min — ABNORMAL LOW (ref >60)
Glucose, Bld: 111 mg/dL — ABNORMAL HIGH (ref 70–99)
Potassium: 4.2 mmol/L (ref 3.5–5.1)
Sodium: 136 mmol/L (ref 135–145)
Total Bilirubin: 0.6 mg/dL (ref 0.0–1.2)
Total Protein: 7.2 g/dL (ref 6.5–8.1)

## 2023-08-13 LAB — KAPPA/LAMBDA LIGHT CHAINS
Kappa free light chain: 73.2 mg/L — ABNORMAL HIGH (ref 3.3–19.4)
Kappa, lambda light chain ratio: 4.98 — ABNORMAL HIGH (ref 0.26–1.65)
Lambda free light chains: 14.7 mg/L (ref 5.7–26.3)

## 2023-08-14 ENCOUNTER — Ambulatory Visit: Payer: Self-pay

## 2023-08-14 ENCOUNTER — Telehealth: Admitting: Physician Assistant

## 2023-08-14 DIAGNOSIS — T679XXA Effect of heat and light, unspecified, initial encounter: Secondary | ICD-10-CM

## 2023-08-14 LAB — MULTIPLE MYELOMA PANEL, SERUM
Albumin SerPl Elph-Mcnc: 4.3 g/dL (ref 2.9–4.4)
Albumin/Glob SerPl: 1.8 — ABNORMAL HIGH (ref 0.7–1.7)
Alpha 1: 0.1 g/dL (ref 0.0–0.4)
Alpha2 Glob SerPl Elph-Mcnc: 0.5 g/dL (ref 0.4–1.0)
B-Globulin SerPl Elph-Mcnc: 0.9 g/dL (ref 0.7–1.3)
Gamma Glob SerPl Elph-Mcnc: 0.9 g/dL (ref 0.4–1.8)
Globulin, Total: 2.4 g/dL (ref 2.2–3.9)
IgA: 336 mg/dL (ref 61–437)
IgG (Immunoglobin G), Serum: 869 mg/dL (ref 603–1613)
IgM (Immunoglobulin M), Srm: 49 mg/dL (ref 15–143)
M Protein SerPl Elph-Mcnc: 0.4 g/dL — ABNORMAL HIGH
Total Protein ELP: 6.7 g/dL (ref 6.0–8.5)

## 2023-08-14 NOTE — Patient Instructions (Signed)
  [name], thank you for joining Christopher Velma Lunger, PA-C for today's virtual visit.  While this provider is not your primary care provider (PCP), if your PCP is located in our provider database this encounter information will be shared with them immediately following your visit.   A [facility] MyChart account gives you access to today's visit and all your visits, tests, and labs performed at [facility]  click here if you don't have a [facility] MyChart account or go to [email or web address]  Consent: (Patient) [name] provided verbal consent for this virtual visit at the beginning of the encounter.  Current Medications:  Current Outpatient Medications:    acetaminophen  (TYLENOL ) 650 MG CR tablet, Take 1,300 mg by mouth every 8 (eight) hours as needed for pain., Disp: , Rfl:    benzonatate  (TESSALON ) 100 MG capsule, Take 1 capsule (100 mg total) by mouth 2 (two) times daily as needed for cough. (Patient not taking: Reported on 05/30/2023), Disp: 20 capsule, Rfl: 0   cholecalciferol (VITAMIN D3) 25 MCG (1000 UT) tablet, Take 2,000 Units by mouth daily. , Disp: , Rfl:    Multiple Vitamins-Minerals (CENTRUM SILVER ULTRA MENS) TABS, Take 1 tablet by mouth daily., Disp: , Rfl:    oxybutynin  (DITROPAN -XL) 10 MG 24 hr tablet, Take 1 tablet (10 mg total) by mouth daily., Disp: 30 tablet, Rfl: 11   tiZANidine  (ZANAFLEX ) 4 MG tablet, Take 0.5-1.5 tablets (2-6 mg total) by mouth every 8 (eight) hours as needed for muscle spasms (muscle tightness)., Disp: 90 tablet, Rfl: 3   Medications ordered in this encounter:  No orders of the defined types were placed in this encounter.    *If you need refills on other medications prior to your next appointment, please contact your pharmacy*  Follow-Up: Call back or seek an in-person evaluation if the symptoms worsen or if the condition fails to improve as anticipated.  [facility] [phone]  Other  Instructions You have been instructed to have an in-person evaluation today at a local Urgent Care facility, please use the link below. It will take you to a list of all of our available [facility], including address, phone number and hours of operation. Please do not delay care.  [facility]  If you or a family member do not have a primary care provider, use the link below to schedule a visit and establish care. When you choose a [facility] primary care physician or advanced practice provider, you gain a long-term partner in health. Find a Primary Care Provider  Learn more about [facility]'s in-office and virtual care options:  - Get Care Now

## 2023-08-14 NOTE — Telephone Encounter (Signed)
 FYI Only or Action Required?: FYI only for provider.  Patient was last seen in primary care on 05/14/2023 by Bernardo Fend, DO.  Called Nurse Triage reporting Heat Exposure.  Symptoms began yesterday.  Interventions attempted: OTC medications: tylenol  and Rest, hydration, or home remedies.  Symptoms are: unchanged.  Triage Disposition: See HCP Within 4 Hours (Or PCP Triage)to Urgent Care  Patient/caregiver understands and will follow disposition?: Yes  Copied from CRM [id number]. Topic: Clinical - Red Word Triage >> Aug 14, 2023 11:20 AM Donee H wrote: [facility] that prompted transfer to Nurse Triage: Patient is stating  experiencing high fever since yesterday. He stated he was outside for most opf the day yesterday and think he overheated. He has had a fever since yesterday that keeps going up to 100.03 and back to 99.0. Patient is wanting to get appointment with PCP.  1. SYMPTOMS: What symptoms are you concerned about?  Patient reports being out in the sun with 105 degree heat index yesterday for about five hours. Patient reports when he got home he was flushed and generally not feeling well. Reports temperature of 100.3  2. ONSET: When did the symptoms start? Patient reports symptoms started yesterday  3. FEVER: Do you have a fever? If Yes, ask: What is it, how was it measured, and when did it start?  Yes-99.3.  4. HEAT EXPOSURE: What caused you to become hot? (e.g., outside in the sun, inside a hot room)   outside playing golf  5. PHYSICAL ACTIVITY: What type of physical activity were you doing? (e.g., working, sports)  play golf  6. SICK: Have you been sick recently? (e.g., cold, flu, recent fever) no  7. OTHER SYMPTOMS: Do you have any other symptoms? (e.g., fainting, flushed skin, weakness, nausea, vomiting, muscle cramps)- patient reports feeling lightheaded, flushed skin, weakness, nausea, muscle cramps in feet.   Patient reports drinking electrolyte water  along with tylenol . Patient was seen in Virtual Care visit prior to calling triage. Patient is recommended to be seen in Urgent Care today as there are no appointments available in office for today.   Reason for Disposition  [1] Painful muscle cramps AND [2] not resolved after 4 hours of rest and drinking liquids  Protocols used: Heat Exposure (Heat Exhaustion and Heat Stroke)-A-AH

## 2023-08-14 NOTE — Telephone Encounter (Signed)
 FYI Only or Action Required?: FYI only for provider.  Patient was last seen in primary care on 05/14/2023 by Bernardo Fend, DO.  Called Nurse Triage reporting No chief complaint on file..  Symptoms began yesterday.  Interventions attempted: Rest, hydration, or home remedies.  Symptoms are: unchanged.  Triage Disposition: See Physician Within 24 Hours  Patient/caregiver understands and will follow disposition?: Yes, will follow disposition  Pt spoke to NT earlier, was told [facility]. Pt wife states that [facility] called him back and said they couldn't do anything for hi mand to call his PCP. Pt scheduled for tomorrow. Pt states that he feels similar as he did earlier and states that he has not gotten worse. Pt advised to hydrate and call back if he has worsening s/s. Pt agreeable.   Copied from CRM [id number]. Topic: Clinical - Red Word Triage >> Aug 14, 2023  1:18 PM Avram MATSU wrote: Red Word that prompted transfer to Nurse Triage: fever was over 100 and last time he checked it was 99   ----------------------------------------------------------------------- From previous Reason for Contact - Scheduling: Patient/patient representative is calling to schedule an appointment. Refer to attachments for appointment information.

## 2023-08-14 NOTE — Progress Notes (Signed)
 Virtual Visit Consent   [name], you are scheduled for a virtual visit with a [facility] provider today. Just as with appointments in the office, your consent must be obtained to participate. Your consent will be active for this visit and any virtual visit you may have with one of our providers in the next 365 days. If you have a MyChart account, a copy of this consent can be sent to you electronically.  As this is a virtual visit, video technology does not allow for your provider to perform a traditional examination. This may limit your provider's ability to fully assess your condition. If your provider identifies any concerns that need to be evaluated in person or the need to arrange testing (such as labs, EKG, etc.), we will make arrangements to do so. Although advances in technology are sophisticated, we cannot ensure that it will always work on either your end or our end. If the connection with a video visit is poor, the visit may have to be switched to a telephone visit. With either a video or telephone visit, we are not always able to ensure that we have a secure connection.  By engaging in this virtual visit, you consent to the provision of healthcare and authorize for your insurance to be billed (if applicable) for the services provided during this visit. Depending on your insurance coverage, you may receive a charge related to this service.  I need to obtain your verbal consent now. Are you willing to proceed with your visit today? [name] has provided verbal consent on 08/14/2023 for a virtual visit (video or telephone). [name], [place]  Date: 08/14/2023 10:30 AM   Virtual Visit via Video Note   I, [name], connected with  KEIAN ODRISCOLL  ([id number], Apr 15, 1950) on 08/14/23 at 10:45 AM EDT by a video-enabled telemedicine application and verified that I am speaking with the correct person using two identifiers.  Location: Patient: Virtual Visit Location  Patient: Home Provider: Virtual Visit Location Provider: Home Office   I discussed the limitations of evaluation and management by telemedicine and the availability of in person appointments. The patient expressed understanding and agreed to proceed.    History of Present Illness: [name] is a 73 y.o. who identifies as a male who was assigned male at birth, and is being seen today for low grade fevers since yesterday. Notes yesterday he was outside playing golf for several hours. Was hydrating well during that time but due to the 105 degree heat index., feels he got overheated. Noted feeling tired. Went home and wife noted how flushed he still was. Notes he took tylenol  and hydrated more, also placing a cooling fan around his neck. Took temperature at that time and noted to be at 100 degrees. Rechecked a bit later after the tylenol  and was down to 99.8. Notes he continued to cool himself and hydrate. Today he is feeling better but continues to note low-grade fever 100 with Tylenol . Still noting some fatigue.  HPI: HPI  Problems:  Patient Active Problem List   Diagnosis Date Noted   Other chronic pain 04/05/2022   S/P fusion of sacroiliac joint 04/05/2022   History of lumbar spinal fusion 04/05/2022   Benign prostatic hyperplasia 04/05/2022   Multiple myeloma (HCC) 04/05/2022   Stage 3a chronic kidney disease (HCC) 04/05/2022   Hypertension 04/05/2022   Spinal cord stimulator status 03/03/2018   Major depressive disorder in partial remission (HCC) 03/03/2018  History of colon cancer 08/19/2017   OP (osteoporosis) 04/19/2017   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   Anxiety disorder 10/04/2014   Gastroesophageal reflux disease without esophagitis 10/04/2014    Allergies:  Allergies  Allergen Reactions   Bee Venom Swelling   Medications:  Current Outpatient Medications:    acetaminophen  (TYLENOL ) 650 MG CR tablet, Take 1,300 mg by mouth every 8 (eight) hours as needed for  pain., Disp: , Rfl:    benzonatate  (TESSALON ) 100 MG capsule, Take 1 capsule (100 mg total) by mouth 2 (two) times daily as needed for cough. (Patient not taking: Reported on 05/30/2023), Disp: 20 capsule, Rfl: 0   cholecalciferol (VITAMIN D3) 25 MCG (1000 UT) tablet, Take 2,000 Units by mouth daily. , Disp: , Rfl:    Multiple Vitamins-Minerals (CENTRUM SILVER ULTRA MENS) TABS, Take 1 tablet by mouth daily., Disp: , Rfl:    oxybutynin  (DITROPAN -XL) 10 MG 24 hr tablet, Take 1 tablet (10 mg total) by mouth daily., Disp: 30 tablet, Rfl: 11   tiZANidine  (ZANAFLEX ) 4 MG tablet, Take 0.5-1.5 tablets (2-6 mg total) by mouth every 8 (eight) hours as needed for muscle spasms (muscle tightness)., Disp: 90 tablet, Rfl: 3  Observations/Objective: Patient is well-developed, well-nourished in no acute distress.  Resting comfortably  at home.  Head is normocephalic, atraumatic.  No labored breathing.  Speech is clear and coherent with logical content.  Patient is alert and oriented at baseline.  No facial flushing noted on exam.   Assessment and Plan: 1. Heat effect, initial encounter (Primary)  Outside for many hours in the direct sun with 105 heat index. Concern for initial mild heat exhaustion/hyperthermia. Feeling much better today. Still with borderline low-grade fever. Of note CBC from follow-up at cancer center two days ago with slightly elevated white count and neutrophil %. He needs in person evaluation today for proper examination and workup as a precaution, especially giving his medical history. He agrees to be evaluated ASAP today.  Follow Up Instructions: I discussed the assessment and treatment plan with the patient. The patient was provided an opportunity to ask questions and all were answered. The patient agreed with the plan and demonstrated an understanding of the instructions.  A copy of instructions were sent to the patient via MyChart unless otherwise noted below.   The patient was  advised to call back or seek an in-person evaluation if the symptoms worsen or if the condition fails to improve as anticipated.    Christopher Velma Lunger, PA-C

## 2023-08-15 ENCOUNTER — Ambulatory Visit: Admitting: Nurse Practitioner

## 2023-08-15 ENCOUNTER — Ambulatory Visit
Admission: RE | Admit: 2023-08-15 | Discharge: 2023-08-15 | Disposition: A | Discharge status: Home / Self Care | Attending: Nurse Practitioner | Admitting: Nurse Practitioner

## 2023-08-15 ENCOUNTER — Ambulatory Visit: Payer: Self-pay | Admitting: Nurse Practitioner

## 2023-08-15 ENCOUNTER — Ambulatory Visit
Admission: RE | Admit: 2023-08-15 | Discharge: 2023-08-15 | Disposition: A | Discharge status: Home / Self Care | Source: Ambulatory Visit | Attending: Nurse Practitioner | Admitting: Nurse Practitioner

## 2023-08-15 VITALS — BP 158/70 | HR 59 | Temp 97.8°F | Resp 16 | Wt 131.4 lb

## 2023-08-15 DIAGNOSIS — R509 Fever, unspecified: Secondary | ICD-10-CM

## 2023-08-15 DIAGNOSIS — D472 Monoclonal gammopathy: Secondary | ICD-10-CM | POA: Diagnosis not present

## 2023-08-15 LAB — POCT URINALYSIS DIPSTICK
Bilirubin, UA: NEGATIVE
Blood, UA: NEGATIVE
Glucose, UA: NEGATIVE
Ketones, UA: NEGATIVE
Leukocytes, UA: NEGATIVE
Nitrite, UA: NEGATIVE
Protein, UA: NEGATIVE
Spec Grav, UA: 1.02 (ref 1.010–1.025)
Urobilinogen, UA: 0.2 U/dL
pH, UA: 6 (ref 5.0–8.0)

## 2023-08-15 NOTE — Progress Notes (Signed)
 BP (!) 158/70 (BP Location: Left Arm, Patient Position: Sitting, Cuff Size: Normal)   Pulse (!) 59   Temp 97.8 F (36.6 C) (Oral)   Resp 16   Wt 131 lb 6.4 oz (59.6 kg)   SpO2 98%   BMI 20.58 kg/m    Subjective:    Patient ID: [name], male    DOB: 1950/09/30, 73 y.o.   MRN: [id number]  HPI: [name] is a 73 y.o. male presenting with low grade fevers since 08/13/2023. He reports symptoms came on two days ago after playing golf in the heat. Patient reports staying hydrated well on the course. He states when he got home he was flushed and had temp of 100.4. Patient reports temp has been fluctuating between 99 and 100 the last 2 days. Reports he has been taking tylenol  for sx. Reports no fever today but reports feeling tired and fatigued. Denies chest pain or shortness of breath. Reports some nausea, but denies vomiting, diarrhea, and abdominal pain. Reports a chronic cough. History of smoking. Reports chronic lower back pain and dysuria due to BPH. Treated for a UTI back in April on a short course of antibiotics due to history of C-diff.    Patient reports he is followed by an Oncologist for Smoldering Myeloma and is due to follow-up on Monday. Patient is not currently on a treatment regimen.      05/30/2023    3:23 PM 10/15/2022   11:19 AM 04/05/2022    9:07 AM  Depression screen PHQ 2/9  Decreased Interest 0 0 0  Down, Depressed, Hopeless 1 0 0  PHQ - 2 Score 1 0 0  Altered sleeping 0 0 0  Tired, decreased energy 0 0 0  Change in appetite 0 0 0  Feeling bad or failure about yourself  0 0 0  Trouble concentrating 0 0 0  Moving slowly or fidgety/restless 0 0 0  Suicidal thoughts 0 0 0  PHQ-9 Score 1 0 0  Difficult doing work/chores Not difficult at all Not difficult at all Not difficult at all    Relevant past medical, surgical, family and social history reviewed and updated as indicated. Interim medical history since our last visit reviewed. Allergies and medications  reviewed and updated.  Review of Systems  Constitutional: Positive for fever and fatigue. Denies weight change.  Respiratory: Positive for chronic cough. Denies shortness of breath.   Cardiovascular: Negative for chest pain or palpitations.  Gastrointestinal: Reports occasional nausea. Negative for vomiting, abdominal pain and bowel changes.  Musculoskeletal: Negative for gait problem or joint swelling.  Skin: Negative for rash.  Neurological: Negative for dizziness or headache.  No other specific complaints in a complete review of systems (except as listed in HPI above).      Objective:     BP (!) 158/70 (BP Location: Left Arm, Patient Position: Sitting, Cuff Size: Normal)   Pulse (!) 59   Temp 97.8 F (36.6 C) (Oral)   Resp 16   Wt 131 lb 6.4 oz (59.6 kg)   SpO2 98%   BMI 20.58 kg/m    Wt Readings from Last 3 Encounters:  08/15/23 131 lb 6.4 oz (59.6 kg)  07/25/23 132 lb (59.9 kg)  02/18/23 134 lb 9.6 oz (61.1 kg)    Physical Exam Constitutional:      Appearance: Normal appearance.  HENT:     Head: Normocephalic and atraumatic.  Cardiovascular:     Rate and Rhythm: Normal rate and  regular rhythm.     Pulses: Normal pulses.     Heart sounds: Normal heart sounds.  Pulmonary:     Effort: Pulmonary effort is normal. No respiratory distress.     Breath sounds: Normal breath sounds.  Skin:    General: Skin is dry.  Neurological:     General: No focal deficit present.     Mental Status: He is alert and oriented to person, place, and time.  Psychiatric:        Mood and Affect: Mood normal.        Behavior: Behavior normal.        Thought Content: Thought content normal.        Judgment: Judgment normal.      Results for orders placed or performed in visit on 08/15/23  POCT urinalysis dipstick   Collection Time: 08/15/23 10:59 AM  Result Value Ref Range   Color, UA Light Yellow    Clarity, UA Clear    Glucose, UA Negative Negative   Bilirubin, UA Negative     Ketones, UA Negative    Spec Grav, UA 1.020 1.010 - 1.025   Blood, UA Negative    pH, UA 6.0 5.0 - 8.0   Protein, UA Negative Negative   Urobilinogen, UA 0.2 0.2 or 1.0 E.U./dL   Nitrite, UA Negative    Leukocytes, UA Negative Negative   Appearance     Odor            Assessment & Plan:   Problem List Items Addressed This Visit       Other   Smoldering multiple myeloma (SMM)   Follow-up with Oncologist on Monday 08/19/2023      Other Visit Diagnoses       Fever of unknown origin    -  Primary   Labs orderd and chest X-ray. urine dip performed and was negative for leukocytes and nitrites   Relevant Orders   CBC with Differential/Platelet   POCT urinalysis dipstick (Completed)   Comprehensive metabolic panel with GFR   Sedimentation rate   C-reactive protein   Blood culture (routine single)   DG Chest 2 View   Blood culture (routine single)        Assessment and Plan: -Offered COVID and FLU testing due to fever of unknown origin, however patient declined. -Urine dip performed and was unremarkable.  -Labs, blood cultures, and and chest X-ray ordered due to fever of unknown origin.  -Plan to follow-up with Oncologist on Monday  -Advised rest and hydration. -Advised to continue monitoring temperature at home.          Follow up plan: Return if symptoms worsen or fail to improve.   I have reviewed this encounter including the documentation in this note and/or discussed this patient with the provider, Aislinn Womack, SNP, I am certifying that I agree with the content of this note as supervising/preceptor nurse practitioner.  Mliss Spray, FNP-C [facility] 08/15/2023, 11:11 AM

## 2023-08-15 NOTE — Assessment & Plan Note (Signed)
 Follow-up with Oncologist on Monday 08/19/2023

## 2023-08-19 ENCOUNTER — Inpatient Hospital Stay: Payer: Medicare Other | Admitting: Internal Medicine

## 2023-08-19 ENCOUNTER — Encounter: Payer: Self-pay | Admitting: Internal Medicine

## 2023-08-19 VITALS — BP 152/88 | HR 52 | Temp 96.8°F | Resp 16 | Ht 67.0 in | Wt 132.5 lb

## 2023-08-19 DIAGNOSIS — D472 Monoclonal gammopathy: Secondary | ICD-10-CM

## 2023-08-19 DIAGNOSIS — C9000 Multiple myeloma not having achieved remission: Secondary | ICD-10-CM | POA: Diagnosis not present

## 2023-08-19 NOTE — Assessment & Plan Note (Signed)
#   SMOLDERING MULTIPLE MYELOMA [2013 s/p BMBx;  [place] ]. No clinical evidence of progression.    # JULY 2025: M protein-kappa lambda light chain ratio ~4.8- overall clinically stable. Do not suspect any clinical progression of multiple myeloma. stable.  # CKD stage II/III discussed the potential causes; check BP at home. s/p evaluation with Dr.Stoioff-PSA June 2023 2.0 on Flomax ; stable.  # Arthritis- recommend HOLD off NSAIDS; ok to take tylenol  prn as needed. stable.   # DISPOSITION:  # follow up in 6 months- 1 week prior labs-MD-cbc/cmp/MM panel/K-L light chains- -Dr.B

## 2023-08-19 NOTE — Progress Notes (Signed)
 Last week pt states he got over heated saw  Gareth Mliss FALCON, FNP at [facility], labs/xray.

## 2023-08-19 NOTE — Progress Notes (Signed)
 [name]  Patient Care Team: Leavy Mole, PA-C as PCP - General (Family Medicine) Rennie Cindy SAUNDERS, MD as Consulting Physician (Internal Medicine)   Cancer Staging  No matching staging information was found for the patient.    Oncology History Overview [name]  # NOV 2013- SMOLDERING MULTIPLE MYELOMA [IgA Kappa; BMBx- 10-12% plasma cell;Dr.Trillo Frisco]   # 2001- COLON CA STAGE III [s/p chemo; Register]  # colonoscopy- 2018/ Dr.Anna  # ? UTI/kidney stone [Dr.Stoiff]- s/p Anti-biotics. S/p removal   DIAGNOSIS: Smoldering myeloma  GOALS: Cure  CURRENT/MOST RECENT THERAPY: Surveillance    Smoldering multiple myeloma (SMM)   INTERVAL HISTORY: Ambulating independently.  With his wife.   [name] 73 y.o.  male pleasant patient above history of smoldering myeloma currently on surveillance is here for follow-up.  Last week pt states he got dehydrated after playing golf. S/p evaluation with PCP. He feels much improved today.   Complains of back pain- chronic not any worse. Patient continues to play golf/bowling.    Has been using Tylenol  no other NSAIDs.  Review of Systems  Constitutional:  Negative for chills, diaphoresis, fever, malaise/fatigue and weight loss.  HENT:  Negative for nosebleeds and sore throat.   Eyes:  Negative for double vision.  Respiratory:  Negative for cough, hemoptysis, sputum production, shortness of breath and wheezing.   Cardiovascular:  Negative for chest pain, palpitations, orthopnea and leg swelling.  Gastrointestinal:  Negative for abdominal pain, blood in stool, constipation, diarrhea, heartburn, melena, nausea and vomiting.  Genitourinary:  Negative for dysuria, frequency and urgency.  Musculoskeletal:  Positive for back pain and joint pain.  Skin: Negative.  Negative for itching and rash.  Neurological:  Negative for dizziness, tingling, focal weakness, weakness and headaches.   Endo/Heme/Allergies:  Does not bruise/bleed easily.  Psychiatric/Behavioral:  Negative for depression. The patient is not nervous/anxious and does not have insomnia.       PAST MEDICAL HISTORY :  Past Medical History:  Diagnosis Date   Abnormal laboratory test    Sees Dr. Marina   Acid reflux    Allergy bee venom   mainly as a child   Anxiety 12/08/1969   Arthritis    Asthma 12/08/1953   mainly as a child   Chronic back pain    Colon cancer (HCC) 11/1999   stage 3 s/p colon resection   Depression    H/O Clostridium difficile infection    History of chemotherapy    5-FU pump/leukovorin   History of kidney stones    Hypertension 04/05/2022   Hypothyroidism    NO MEDS NOW   Insomnia    Multiple myeloma (HCC) 12/2011   smoldering vs mgus   Nephrolithiasis 08/28/2017   Osteoporosis    Seasonal allergies     PAST SURGICAL HISTORY :   Past Surgical History:  Procedure Laterality Date   BACK SURGERY     COLON SURGERY  2001   resection, 2nd surgery for scar tissue removal   COLONOSCOPY     COLONOSCOPY WITH PROPOFOL  N/A 03/26/2016   Procedure: COLONOSCOPY WITH PROPOFOL ;  Surgeon: Gladis RAYMOND Mariner, MD;  Location: [facility] ENDOSCOPY;  Service: Endoscopy;  Laterality: N/A;   COLONOSCOPY WITH PROPOFOL  N/A 06/16/2021   Procedure: COLONOSCOPY WITH PROPOFOL ;  Surgeon: Therisa Bi, MD;  Location: [facility] ENDOSCOPY;  Service: Gastroenterology;  Laterality: N/A;   CYSTOSCOPY WITH URETEROSCOPY AND STENT PLACEMENT Left 02/21/2018   Procedure: URETEROSCOPY, LASER LITHOTRIPSY, STONE REMOVAL AND STENT PLACEMENT;  Surgeon: Twylla Glendia BROCKS, MD;  Location: [facility] ORS;  Service: Urology;  Laterality: Left;   CYSTOSCOPY/URETEROSCOPY/HOLMIUM LASER/STENT PLACEMENT Right 09/03/2017   Procedure: CYSTOSCOPY/URETEROSCOPY/HOLMIUM LASER/STENT PLACEMENT;  Surgeon: Twylla Glendia BROCKS, MD;  Location: [facility] ORS;  Service: Urology;  Laterality: Right;   ESOPHAGOGASTRODUODENOSCOPY  10/2010   EYE SURGERY  1994 and 2010    RKA in 94 and Lazik in 2010   FLEXIBLE SIGMOIDOSCOPY N/A 07/06/2018   Procedure: FLEXIBLE SIGMOIDOSCOPY;  Surgeon: Toledo, Ladell POUR, MD;  Location: [facility] ENDOSCOPY;  Service: Gastroenterology;  Laterality: N/A;   HERNIA REPAIR  1970   lower left   HOLMIUM LASER APPLICATION Left 02/21/2018   Procedure: HOLMIUM LASER APPLICATION;  Surgeon: Twylla Glendia BROCKS, MD;  Location: [facility] ORS;  Service: Urology;  Laterality: Left;   LUMBAR FUSION     L4-L5, L5-S1   SACROILIAC JOINT FUSION     SHOULDER SURGERY Left    SPINAL CORD STIMULATOR IMPLANT      FAMILY HISTORY :   Family History  Problem Relation Age of Onset   Lymphoma Mother    Arthritis Mother        deceased   Cancer Mother    Lung cancer Father    Alcohol abuse Father        deceased   Cancer Father    Heart disease Other        grandparents   Hyperlipidemia Sister    Diabetes Brother    Diabetes Son    Alcohol abuse Brother    Arthritis Brother    Depression Brother    Diabetes Brother    Alcohol abuse Sister    Arthritis Sister    Depression Sister    Alcohol abuse Son    Early death Son        truck accident 70 at age 58    SOCIAL HISTORY:   Social History   Tobacco Use   Smoking status: Former    Current packs/day: 0.00    Average packs/day: 1 pack/day for 10.0 years (10.0 ttl pk-yrs)    Types: Cigarettes    Start date: 03/06/1960    Quit date: 03/06/1970    Years since quitting: 53.4   Smokeless tobacco: Never   Tobacco comments:    started at age 77 and quit at age 75  Vaping Use   Vaping status: Never Used  Substance Use Topics   Alcohol use: No   Drug use: No    ALLERGIES:  is allergic to bee venom.  MEDICATIONS:  Current Outpatient Medications  Medication Sig Dispense Refill   acetaminophen  (TYLENOL ) 650 MG CR tablet Take 1,300 mg by mouth every 8 (eight) hours as needed for pain.     cholecalciferol (VITAMIN D3) 25 MCG (1000 UT) tablet Take 2,000 Units by mouth daily.      Multiple  Vitamins-Minerals (CENTRUM SILVER ULTRA MENS) TABS Take 1 tablet by mouth daily.     oxybutynin  (DITROPAN -XL) 10 MG 24 hr tablet Take 1 tablet (10 mg total) by mouth daily. 30 tablet 11   tiZANidine  (ZANAFLEX ) 4 MG tablet Take 0.5-1.5 tablets (2-6 mg total) by mouth every 8 (eight) hours as needed for muscle spasms (muscle tightness). 90 tablet 3   No current facility-administered medications for this visit.    PHYSICAL EXAMINATION: ECOG PERFORMANCE STATUS: 0 - Asymptomatic  BP (!) 152/88 (BP Location: Left Arm, Patient Position: Sitting, Cuff Size: Normal) Comment: pt told bp elevated, keep check at home, contact pcp if con't to stay  elevated  Pulse (!) 52   Temp (!) 96.8 F (36 C) (Tympanic)   Resp 16   Ht 5' 7 (1.702 m)   Wt 132 lb 8 oz (60.1 kg)   SpO2 99%   BMI 20.75 kg/m   Filed Weights   08/19/23 0949  Weight: 132 lb 8 oz (60.1 kg)     Physical Exam HENT:     Head: Normocephalic and atraumatic.     Mouth/Throat:     Pharynx: No oropharyngeal exudate.  Eyes:     Pupils: Pupils are equal, round, and reactive to light.  Cardiovascular:     Rate and Rhythm: Normal rate and regular rhythm.  Pulmonary:     Effort: No respiratory distress.     Breath sounds: No wheezing.  Abdominal:     General: Bowel sounds are normal. There is no distension.     Palpations: Abdomen is soft. There is no mass.     Tenderness: There is no abdominal tenderness. There is no guarding or rebound.  Musculoskeletal:        General: No tenderness. Normal range of motion.     Cervical back: Normal range of motion and neck supple.  Skin:    General: Skin is warm.  Neurological:     Mental Status: He is alert and oriented to person, place, and time.  Psychiatric:        Mood and Affect: Affect normal.    LABORATORY DATA:  I have reviewed the data as listed    Component Value Date/Time   NA 137 08/15/2023 1110   K 4.3 08/15/2023 1110   CL 100 08/15/2023 1110   CO2 27 08/15/2023 1110    GLUCOSE 91 08/15/2023 1110   BUN 18 08/15/2023 1110   CREATININE 1.18 08/15/2023 1110   CALCIUM  9.9 08/15/2023 1110   CALCIUM  9.6 12/28/2013 1428   PROT 7.2 08/15/2023 1110   PROT 6.7 06/27/2012 1136   ALBUMIN 4.3 08/12/2023 0953   ALBUMIN 3.5 06/27/2012 1136   AST 19 08/15/2023 1110   AST 20 08/12/2023 0953   ALT 14 08/15/2023 1110   ALT 15 08/12/2023 0953   ALT 22 06/27/2012 1136   ALKPHOS 75 08/12/2023 0953   ALKPHOS 91 06/27/2012 1136   BILITOT 0.5 08/15/2023 1110   BILITOT 0.6 08/12/2023 0953   GFRNONAA 56 (L) 08/12/2023 0953   GFRNONAA 58 (L) 11/25/2017 1401   GFRAA >60 07/17/2019 0955   GFRAA 67 11/25/2017 1401    No results found for: SPEP, UPEP  Lab Results  Component Value Date   WBC 8.2 08/15/2023   NEUTROABS 5,215 08/15/2023   HGB 15.1 08/15/2023   HCT 46.5 08/15/2023   MCV 97.1 08/15/2023   PLT 415 (H) 08/15/2023      Chemistry      Component Value Date/Time   NA 137 08/15/2023 1110   K 4.3 08/15/2023 1110   CL 100 08/15/2023 1110   CO2 27 08/15/2023 1110   BUN 18 08/15/2023 1110   CREATININE 1.18 08/15/2023 1110      Component Value Date/Time   CALCIUM  9.9 08/15/2023 1110   CALCIUM  9.6 12/28/2013 1428   ALKPHOS 75 08/12/2023 0953   ALKPHOS 91 06/27/2012 1136   AST 19 08/15/2023 1110   AST 20 08/12/2023 0953   ALT 14 08/15/2023 1110   ALT 15 08/12/2023 0953   ALT 22 06/27/2012 1136   BILITOT 0.5 08/15/2023 1110   BILITOT 0.6 08/12/2023 0953  RADIOGRAPHIC STUDIES: I have personally reviewed the radiological images as listed and agreed with the findings in the report. No results found.   ASSESSMENT & PLAN:  Smoldering multiple myeloma (SMM) (HCC) # SMOLDERING MULTIPLE MYELOMA [2013 s/p BMBx;  [place] ]. No clinical evidence of progression.    # JULY 2025: M protein-kappa lambda light chain ratio ~4.8- overall clinically stable. Do not suspect any clinical progression of multiple myeloma. stable.  # CKD stage II/III  discussed the potential causes; check BP at home. s/p evaluation with Dr.Stoioff-PSA June 2023 2.0 on Flomax ; stable.  # Arthritis- recommend HOLD off NSAIDS; ok to take tylenol  prn as needed. stable.   # DISPOSITION:  # follow up in 6 months- 1 week prior labs-MD-cbc/cmp/MM panel/K-L light chains- -Dr.B   Orders Placed This Encounter  Procedures   CMP (Cancer Center only)    Standing Status:   Future    Expected Date:   02/10/2024    Expiration Date:   05/10/2024   CBC with Differential (Cancer Center Only)    Standing Status:   Future    Expected Date:   02/10/2024    Expiration Date:   05/10/2024   Multiple Myeloma Panel (SPEP&IFE w/QIG)    Standing Status:   Future    Expected Date:   02/10/2024    Expiration Date:   05/10/2024   Kappa/lambda light chains    Standing Status:   Future    Expected Date:   02/10/2024    Expiration Date:   05/10/2024   All questions were answered. The patient knows to call the clinic with any problems, questions or concerns.      Cindy JONELLE Joe, MD 08/19/2023 10:47 AM

## 2023-08-21 LAB — CBC WITH DIFFERENTIAL/PLATELET
Absolute Lymphocytes: 2165 {cells}/uL (ref 850–3900)
Absolute Monocytes: 681 {cells}/uL (ref 200–950)
Basophils Absolute: 33 {cells}/uL (ref 0–200)
Basophils Relative: 0.4 %
Eosinophils Absolute: 107 {cells}/uL (ref 15–500)
Eosinophils Relative: 1.3 %
HCT: 46.5 % (ref 38.5–50.0)
Hemoglobin: 15.1 g/dL (ref 13.2–17.1)
MCH: 31.5 pg (ref 27.0–33.0)
MCHC: 32.5 g/dL (ref 32.0–36.0)
MCV: 97.1 fL (ref 80.0–100.0)
MPV: 9.9 fL (ref 7.5–12.5)
Monocytes Relative: 8.3 %
Neutro Abs: 5215 {cells}/uL (ref 1500–7800)
Neutrophils Relative %: 63.6 %
Platelets: 415 Thousand/uL — ABNORMAL HIGH (ref 140–400)
RBC: 4.79 Million/uL (ref 4.20–5.80)
RDW: 11.8 % (ref 11.0–15.0)
Total Lymphocyte: 26.4 %
WBC: 8.2 Thousand/uL (ref 3.8–10.8)

## 2023-08-21 LAB — COMPREHENSIVE METABOLIC PANEL WITH GFR
AG Ratio: 1.9 (calc) (ref 1.0–2.5)
ALT: 14 U/L (ref 9–46)
AST: 19 U/L (ref 10–35)
Albumin: 4.7 g/dL (ref 3.6–5.1)
Alkaline phosphatase (APISO): 63 U/L (ref 35–144)
BUN: 18 mg/dL (ref 7–25)
CO2: 27 mmol/L (ref 20–32)
Calcium: 9.9 mg/dL (ref 8.6–10.3)
Chloride: 100 mmol/L (ref 98–110)
Creat: 1.18 mg/dL (ref 0.70–1.28)
Globulin: 2.5 g/dL (ref 1.9–3.7)
Glucose, Bld: 91 mg/dL (ref 65–99)
Potassium: 4.3 mmol/L (ref 3.5–5.3)
Sodium: 137 mmol/L (ref 135–146)
Total Bilirubin: 0.5 mg/dL (ref 0.2–1.2)
Total Protein: 7.2 g/dL (ref 6.1–8.1)
eGFR: 65 mL/min/1.73m2

## 2023-08-21 LAB — C-REACTIVE PROTEIN: CRP: 12.5 mg/L — ABNORMAL HIGH (ref <8.0)

## 2023-08-21 LAB — CULTURE, BLOOD (SINGLE)
MICRO NUMBER:: 16684348
MICRO NUMBER:: 16684349
Result:: NO GROWTH
Result:: NO GROWTH
SPECIMEN QUALITY:: ADEQUATE
SPECIMEN QUALITY:: ADEQUATE

## 2023-08-21 LAB — SEDIMENTATION RATE: Sed Rate: 2 mm/h (ref 0–20)

## 2023-09-11 ENCOUNTER — Ambulatory Visit: Admitting: Urology

## 2023-09-11 VITALS — BP 161/88 | HR 66

## 2023-09-11 DIAGNOSIS — R399 Unspecified symptoms and signs involving the genitourinary system: Secondary | ICD-10-CM

## 2023-09-11 DIAGNOSIS — N3281 Overactive bladder: Secondary | ICD-10-CM | POA: Diagnosis not present

## 2023-09-11 LAB — BLADDER SCAN AMB NON-IMAGING

## 2023-09-11 MED ORDER — GEMTESA 75 MG PO TABS: 75.0000 mg | ORAL_TABLET | Freq: Every day | ORAL | Status: DC

## 2023-09-11 NOTE — Progress Notes (Signed)
   09/11/2023 9:40 AM   [name] 09-Nov-1950 [id number]  Reason for visit: Follow up urinary symptoms, ED, nephrolithiasis, PSA screening  HPI: 73 year old male with long history of bothersome urinary symptoms.  Previously was followed by Dr. Twylla and requested a second opinion, and I met him for the first time in April 2025.  He has a history of nephrolithiasis treated with ureteroscopy and shockwave lithotripsy, did not do well with shockwave lithotripsy.  His primary urinary complaints are urgency, frequency, sensation of incomplete emptying despite normal PVRs, weak stream, postvoid dribbling, and nocturia 3-4 times at night.  He has not had improvement on Flomax  or alfuzosin  previously.  Cystoscopy with Dr. Twylla in August 2023 showed no significant prostate enlargement, normal bladder, no trabeculations.  He also tried pelvic floor physical therapy which he did not feel was helpful.  PSA was normal at 2 in June 2023.  He was also treated with a course of antibiotics for small amount of Staph epidermidis on a urine culture, but had no improvement in symptoms, atypical cultures were negative.   CT scan from 05/31/2023 showing no acute abnormality, 47 g prostate, decompressed bladder, no hydronephrosis, small nonobstructing left-sided renal stones.  He underwent urodynamics at [facility] urology for further evaluation of his urinary symptoms.  This showed a max capacity of 190 mL.  First sensation was at 88 mL.  There was instability, and unstable contractions.  He was able to void with a max flow 4 mL/s, max detrusor pressure was 19 cm of water.  No reflux was seen.  At our visit in June 2025 we discussed his symptoms were more consistent with OAB and opted for trial of oxybutynin  daily and decreasing bladder irritants.  He reports about a 15% improvement on the oxybutynin , but remains very frustrated with his urinary symptoms.  Primary bothersome issue is nocturia 3-4 times at night.  He is  consuming a large amount of fluids at least 60 to 70 ounces a day, as well as continuing to drink tea and other flavored drinks.  IPSS score today is 32, with quality-of-life mostly dissatisfied.  PVR remains normal at 0ml.  We had another long conversation today about his urinary symptoms, and the challenges between differentiating between OAB and BPH, as well as pelvic floor dysfunction.  From his clinical history and urodynamics, I think his issue is more small capacity bladder and overactive bladder.  He was interested in a trial of Gemtesa  to replace the oxybutynin  and risks and benefits were discussed, also counseled to decrease his soda and tea intake.  Could also consider PTNS or even Botox in the future.  If no improvement with OAB treatments could consider TUIP or UroLift, but history more consistent with OAB over BPH.  Trial of Gemtesa  daily Behavioral strategies again reviewed extensively Voiding diary RTC 6 weeks symptom check and PVR  Christopher JAYSON Burnet, MD  [facility] [address] [place], [place] [address] [phone]

## 2023-09-11 NOTE — Patient Instructions (Signed)

## 2023-09-23 ENCOUNTER — Encounter: Payer: Self-pay | Admitting: Ophthalmology

## 2023-09-30 NOTE — Discharge Instructions (Signed)

## 2023-10-02 ENCOUNTER — Encounter
Admission: RE | Disposition: A | Discharge status: Home / Self Care | Payer: Self-pay | Source: Home / Self Care | Attending: Ophthalmology

## 2023-10-02 ENCOUNTER — Ambulatory Visit
Admission: RE | Admit: 2023-10-02 | Discharge: 2023-10-02 | Disposition: A | Discharge status: Home / Self Care | Attending: Ophthalmology | Admitting: Ophthalmology

## 2023-10-02 ENCOUNTER — Ambulatory Visit: Payer: Self-pay | Admitting: Anesthesiology

## 2023-10-02 ENCOUNTER — Other Ambulatory Visit: Payer: Self-pay

## 2023-10-02 DIAGNOSIS — I10 Essential (primary) hypertension: Secondary | ICD-10-CM | POA: Diagnosis not present

## 2023-10-02 DIAGNOSIS — Z87891 Personal history of nicotine dependence: Secondary | ICD-10-CM | POA: Diagnosis not present

## 2023-10-02 DIAGNOSIS — H2511 Age-related nuclear cataract, right eye: Secondary | ICD-10-CM | POA: Diagnosis present

## 2023-10-02 DIAGNOSIS — K219 Gastro-esophageal reflux disease without esophagitis: Secondary | ICD-10-CM | POA: Diagnosis not present

## 2023-10-02 DIAGNOSIS — N289 Disorder of kidney and ureter, unspecified: Secondary | ICD-10-CM | POA: Diagnosis not present

## 2023-10-02 DIAGNOSIS — J45909 Unspecified asthma, uncomplicated: Secondary | ICD-10-CM | POA: Diagnosis not present

## 2023-10-02 HISTORY — PX: CATARACT EXTRACTION W/PHACO: SHX586

## 2023-10-02 SURGERY — PHACOEMULSIFICATION, CATARACT, WITH IOL INSERTION
Anesthesia: Monitor Anesthesia Care | Site: Eye | Laterality: Right

## 2023-10-02 MED ORDER — SIGHTPATH DOSE#1 BSS IO SOLN
INTRAOCULAR | Status: DC | PRN
  Administered 2023-10-02: 76 mL via OPHTHALMIC

## 2023-10-02 MED ORDER — LIDOCAINE HCL (PF) 2 % IJ SOLN
INTRAOCULAR | Status: DC | PRN
  Administered 2023-10-02: 2 mL

## 2023-10-02 MED ORDER — TETRACAINE HCL 0.5 % OP SOLN
OPHTHALMIC | Status: AC
  Filled 2023-10-02: qty 4

## 2023-10-02 MED ORDER — MIDAZOLAM HCL 2 MG/2ML IJ SOLN
INTRAMUSCULAR | Status: DC | PRN
  Administered 2023-10-02 (×2): 1 mg via INTRAVENOUS

## 2023-10-02 MED ORDER — TETRACAINE HCL 0.5 % OP SOLN
1.0000 [drp] | OPHTHALMIC | Status: DC | PRN
  Administered 2023-10-02 (×3): 1 [drp] via OPHTHALMIC

## 2023-10-02 MED ORDER — ARMC OPHTHALMIC DILATING DROPS
1.0000 | OPHTHALMIC | Status: DC | PRN
  Administered 2023-10-02 (×3): 1 via OPHTHALMIC

## 2023-10-02 MED ORDER — MIDAZOLAM HCL 2 MG/2ML IJ SOLN
INTRAMUSCULAR | Status: AC
  Filled 2023-10-02: qty 2

## 2023-10-02 MED ORDER — SIGHTPATH DOSE#1 BSS IO SOLN
INTRAOCULAR | Status: DC | PRN
  Administered 2023-10-02: 15 mL via INTRAOCULAR

## 2023-10-02 MED ORDER — SIGHTPATH DOSE#1 NA HYALUR & NA CHOND-NA HYALUR IO KIT
PACK | INTRAOCULAR | Status: DC | PRN
  Administered 2023-10-02: 1 via OPHTHALMIC

## 2023-10-02 MED ORDER — ARMC OPHTHALMIC DILATING DROPS
OPHTHALMIC | Status: AC
  Filled 2023-10-02: qty 0.5

## 2023-10-02 MED ORDER — BRIMONIDINE TARTRATE-TIMOLOL 0.2-0.5 % OP SOLN
OPHTHALMIC | Status: DC | PRN
  Administered 2023-10-02: 1 [drp] via OPHTHALMIC

## 2023-10-02 MED ORDER — CEFUROXIME OPHTHALMIC INJECTION 1 MG/0.1 ML
INJECTION | OPHTHALMIC | Status: DC | PRN
Start: 2023-10-02 — End: 2023-10-02
  Administered 2023-10-02: .1 mL via INTRACAMERAL

## 2023-10-02 MED ORDER — FENTANYL CITRATE (PF) 100 MCG/2ML IJ SOLN
INTRAMUSCULAR | Status: AC
  Filled 2023-10-02: qty 2

## 2023-10-02 MED ORDER — FENTANYL CITRATE (PF) 100 MCG/2ML IJ SOLN
INTRAMUSCULAR | Status: DC | PRN
  Administered 2023-10-02: 50 ug via INTRAVENOUS

## 2023-10-02 SURGICAL SUPPLY — 8 items
FEE CATARACT SUITE SIGHTPATH (MISCELLANEOUS) ×1 IMPLANT
GLOVE BIOGEL PI IND STRL 8 (GLOVE) ×1 IMPLANT
GLOVE SURG LX STRL 7.5 STRW (GLOVE) ×1 IMPLANT
GLOVE SURG SYN 6.5 PF PI BL (GLOVE) ×1 IMPLANT
LENS IOL TECNIS EYHANCE 22.5 (Intraocular Lens) IMPLANT
NDL FILTER BLUNT 18X1 1/2 (NEEDLE) ×1 IMPLANT
NEEDLE FILTER BLUNT 18X1 1/2 (NEEDLE) ×1 IMPLANT
SYR 3ML LL SCALE MARK (SYRINGE) ×1 IMPLANT

## 2023-10-02 NOTE — Op Note (Signed)
 LOCATION:  [facility]   PREOPERATIVE DIAGNOSIS:    Nuclear sclerotic cataract right eye. H25.11   POSTOPERATIVE DIAGNOSIS:  Nuclear sclerotic cataract right eye.     PROCEDURE:  Phacoemusification with posterior chamber intraocular lens placement of the right eye   ULTRASOUND TIME: Procedure(s): PHACOEMULSIFICATION, CATARACT, WITH IOL INSERTION 2.53 00:27.3 (Right)  LENS:   Implant Name Type Inv. Item Serial No. Manufacturer Lot No. LRB No. Used Action  LENS IOL TECNIS EYHANCE 22.5 - [id number] Intraocular Lens LENS IOL TECNIS EYHANCE 22.5 [id number] [employer]  Right 1 Implanted         SURGEON:  Dene FABIENE Etienne, MD   ANESTHESIA:  Topical with tetracaine  drops and 2% Xylocaine  jelly, augmented with 1% preservative-free intracameral lidocaine .    COMPLICATIONS:  None.   DESCRIPTION OF PROCEDURE:  The patient was identified in the holding room and transported to the operating room and placed in the supine position under the operating microscope.  The right eye was identified as the operative eye and it was prepped and draped in the usual sterile ophthalmic fashion.   A 1 millimeter clear-corneal paracentesis was made at the 12:00 position.  0.5 ml of preservative-free 1% lidocaine  was injected into the anterior chamber. The anterior chamber was filled with Viscoat viscoelastic.  A 2.4 millimeter keratome was used to make a near-clear corneal incision at the 9:00 position.  A curvilinear capsulorrhexis was made with a cystotome and capsulorrhexis forceps.  Balanced salt solution was used to hydrodissect and hydrodelineate the nucleus.   Phacoemulsification was then used in stop and chop fashion to remove the lens nucleus and epinucleus.  The remaining cortex was then removed using the irrigation and aspiration handpiece. Provisc was then placed into the capsular bag to distend it for lens placement.  A lens was then injected into the capsular bag.  The remaining  viscoelastic was aspirated.   Wounds were hydrated with balanced salt solution.  The anterior chamber was inflated to a physiologic pressure with balanced salt solution.  No wound leaks were noted. Cefuroxime  0.1 ml of a 10mg /ml solution was injected into the anterior chamber for a dose of 1 mg of intracameral antibiotic at the completion of the case.   Timolol  and Brimonidine  drops were applied to the eye.  The patient was taken to the recovery room in stable condition without complications of anesthesia or surgery.   Shanasia Ibrahim 10/02/2023, 11:57 AM

## 2023-10-02 NOTE — H&P (Signed)
 [facility]   Primary Care Physician:  Leavy Mole, PA-C Ophthalmologist: Dr. Dene Etienne  Pre-Procedure History & Physical: HPI:  [name] is a 73 y.o. male here for ophthalmic surgery.   Past Medical History:  Diagnosis Date   Abnormal laboratory test    Sees Dr. Marina   Acid reflux    Allergy bee venom   mainly as a child   Anxiety 12/08/1969   Arthritis    Asthma 12/08/1953   mainly as a child   Chronic back pain    Colon cancer (HCC) 11/1999   stage 3 s/p colon resection   Depression    H/O Clostridium difficile infection    History of chemotherapy    5-FU pump/leukovorin   History of kidney stones    Hypertension 04/05/2022   Hypothyroidism    NO MEDS NOW   Insomnia    Multiple myeloma (HCC) 12/2011   smoldering vs mgus   Nephrolithiasis 08/28/2017   Osteoporosis    Seasonal allergies     Past Surgical History:  Procedure Laterality Date   BACK SURGERY     COLON SURGERY  2001   resection, 2nd surgery for scar tissue removal   COLONOSCOPY     COLONOSCOPY WITH PROPOFOL  N/A 03/26/2016   Procedure: COLONOSCOPY WITH PROPOFOL ;  Surgeon: Gladis RAYMOND Mariner, MD;  Location: [facility] ENDOSCOPY;  Service: Endoscopy;  Laterality: N/A;   COLONOSCOPY WITH PROPOFOL  N/A 06/16/2021   Procedure: COLONOSCOPY WITH PROPOFOL ;  Surgeon: Therisa Bi, MD;  Location: [facility] ENDOSCOPY;  Service: Gastroenterology;  Laterality: N/A;   CYSTOSCOPY WITH URETEROSCOPY AND STENT PLACEMENT Left 02/21/2018   Procedure: URETEROSCOPY, LASER LITHOTRIPSY, STONE REMOVAL AND STENT PLACEMENT;  Surgeon: Twylla Glendia BROCKS, MD;  Location: [facility] ORS;  Service: Urology;  Laterality: Left;   CYSTOSCOPY/URETEROSCOPY/HOLMIUM LASER/STENT PLACEMENT Right 09/03/2017   Procedure: CYSTOSCOPY/URETEROSCOPY/HOLMIUM LASER/STENT PLACEMENT;  Surgeon: Twylla Glendia BROCKS, MD;  Location: [facility] ORS;  Service: Urology;  Laterality: Right;   ESOPHAGOGASTRODUODENOSCOPY  10/2010   EYE SURGERY  1994 and 2010   RKA in 94  and Lazik in 2010   FLEXIBLE SIGMOIDOSCOPY N/A 07/06/2018   Procedure: FLEXIBLE SIGMOIDOSCOPY;  Surgeon: Toledo, Ladell POUR, MD;  Location: [facility] ENDOSCOPY;  Service: Gastroenterology;  Laterality: N/A;   HERNIA REPAIR  1970   lower left   HOLMIUM LASER APPLICATION Left 02/21/2018   Procedure: HOLMIUM LASER APPLICATION;  Surgeon: Twylla Glendia BROCKS, MD;  Location: [facility] ORS;  Service: Urology;  Laterality: Left;   LUMBAR FUSION     L4-L5, L5-S1   SACROILIAC JOINT FUSION     SHOULDER SURGERY Left    SPINAL CORD STIMULATOR IMPLANT      Prior to Admission medications   Medication Sig Start Date End Date Taking? Authorizing Provider  acetaminophen  (TYLENOL ) 650 MG CR tablet Take 1,300 mg by mouth every 8 (eight) hours as needed for pain.   Yes [provider]  cholecalciferol (VITAMIN D3) 25 MCG (1000 UT) tablet Take 2,000 Units by mouth daily.    Yes [provider]  Multiple Vitamins-Minerals (CENTRUM SILVER ULTRA MENS) TABS Take 1 tablet by mouth daily.   Yes [provider]  tiZANidine  (ZANAFLEX ) 4 MG tablet Take 0.5-1.5 tablets (2-6 mg total) by mouth every 8 (eight) hours as needed for muscle spasms (muscle tightness). 04/05/22  Yes Tapia, Leisa, PA-C  Vibegron  (GEMTESA ) 75 MG TABS Take 1 tablet (75 mg total) by mouth daily. 09/11/23  Yes Francisca Redell BROCKS, MD    Allergies as of  08/29/2023 - Review Complete 08/19/2023  Allergen Reaction Noted   Bee venom Swelling 09/04/2014    Family History  Problem Relation Age of Onset   Lymphoma Mother    Arthritis Mother        deceased   Cancer Mother    Lung cancer Father    Alcohol abuse Father        deceased   Cancer Father    Heart disease Other        grandparents   Hyperlipidemia Sister    Diabetes Brother    Diabetes Son    Alcohol abuse Brother    Arthritis Brother    Depression Brother    Diabetes Brother    Alcohol abuse Sister    Arthritis Sister    Depression Sister    Alcohol abuse Son    Early  death Son        truck accident 50 at age 52    Social History   Socioeconomic History   Marital status: Married    Spouse name: Not on file   Number of children: 2   Years of education: Not on file   Highest education level: 12th grade  Occupational History   Occupation: Retired  Tobacco Use   Smoking status: Former    Current packs/day: 0.00    Average packs/day: 1 pack/day for 10.0 years (10.0 ttl pk-yrs)    Types: Cigarettes    Start date: 03/06/1960    Quit date: 03/06/1970    Years since quitting: 53.6   Smokeless tobacco: Never   Tobacco comments:    started at age 32 and quit at age 49  Vaping Use   Vaping status: Never Used  Substance and Sexual Activity   Alcohol use: No   Drug use: No   Sexual activity: Yes    Birth control/protection: None  Other Topics Concern   Not on file  Social History Narrative   Pt is an avid golfer and bowls every week   Social Drivers of Corporate investment banker Strain: Low Risk  (08/15/2023)   Overall Financial Resource Strain (CARDIA)    Difficulty of Paying Living Expenses: Not hard at all  Food Insecurity: No Food Insecurity (08/15/2023)   Hunger Vital Sign    Worried About Running Out of Food in the Last Year: Never true    Ran Out of Food in the Last Year: Never true  Transportation Needs: No Transportation Needs (08/15/2023)   PRAPARE - Administrator, Civil Service (Medical): No    Lack of Transportation (Non-Medical): No  Physical Activity: Insufficiently Active (08/15/2023)   Exercise Vital Sign    Days of Exercise per Week: 1 day    Minutes of Exercise per Session: 60 min  Stress: Stress Concern Present (08/15/2023)   [employer] of Occupational Health - Occupational Stress Questionnaire    Feeling of Stress: To some extent  Social Connections: Socially Integrated (08/15/2023)   Social Connection and Isolation Panel    Frequency of Communication with Friends and Family: More than three times a  week    Frequency of Social Gatherings with Friends and Family: Three times a week    Attends Religious Services: More than 4 times per year    Active Member of Clubs or Organizations: Yes    Attends Banker Meetings: More than 4 times per year    Marital Status: Married  Catering manager Violence: Not At Risk (05/30/2023)   Humiliation,  Afraid, Rape, and Kick questionnaire    Fear of Current or Ex-Partner: No    Emotionally Abused: No    Physically Abused: No    Sexually Abused: No    Review of Systems: See HPI, otherwise negative ROS  Physical Exam: BP (!) 179/96   Pulse 78   Temp (!) 97.3 F (36.3 C) (Temporal)   Resp 15   Wt 59.4 kg   SpO2 100%   BMI 20.52 kg/m  General:   Alert,  pleasant and cooperative in NAD Head:  Normocephalic and atraumatic. Lungs:  Clear to auscultation.    Heart:  Regular rate and rhythm.   Impression/Plan: Oneil JONELLE Rosella is here for ophthalmic surgery.  Risks, benefits, limitations, and alternatives regarding ophthalmic surgery have been reviewed with the patient.  Questions have been answered.  All parties agreeable.   MITTIE GASKIN, MD  10/02/2023, 10:51 AM

## 2023-10-02 NOTE — Anesthesia Postprocedure Evaluation (Signed)
 Anesthesia Post Note  Patient: [name]  Procedure(s) Performed: PHACOEMULSIFICATION, CATARACT, WITH IOL INSERTION 2.53 00:27.3 (Right: Eye)  Patient location during evaluation: PACU Anesthesia Type: MAC Level of consciousness: awake and alert Pain management: pain level controlled Vital Signs Assessment: post-procedure vital signs reviewed and stable Respiratory status: spontaneous breathing, nonlabored ventilation, respiratory function stable and patient connected to nasal cannula oxygen Cardiovascular status: stable and blood pressure returned to baseline Postop Assessment: no apparent nausea or vomiting Anesthetic complications: no   No notable events documented.   Last Vitals:  Vitals:   10/02/23 1157 10/02/23 1201  BP: (!) 152/98 (!) 159/88  Pulse: 70 62  Resp: (!) 9 (!) 8  Temp: 36.5 C 36.5 C  SpO2: 100% 99%    Last Pain:  Vitals:   10/02/23 1201  TempSrc:   PainSc: 0-No pain                 Lendia LITTIE Mae

## 2023-10-02 NOTE — Anesthesia Preprocedure Evaluation (Signed)
 Anesthesia Evaluation  Patient identified by MRN, date of birth, ID band Patient awake    Reviewed: Allergy & Precautions, NPO status , Patient's Chart, lab work & pertinent test results  History of Anesthesia Complications Negative for: history of anesthetic complications  Airway Mallampati: III  TM Distance: >3 FB Neck ROM: full    Dental  (+) Chipped   Pulmonary asthma , former smoker   Pulmonary exam normal        Cardiovascular hypertension, negative cardio ROS Normal cardiovascular exam     Neuro/Psych  PSYCHIATRIC DISORDERS Anxiety Depression    negative neurological ROS     GI/Hepatic Neg liver ROS,GERD  ,,  Endo/Other  Hypothyroidism    Renal/GU Renal disease     Musculoskeletal   Abdominal   Peds  Hematology negative hematology ROS (+)   Anesthesia Other Findings Past Medical History: No date: Abnormal laboratory test     Comment:  Sees Dr. Marina No date: Acid reflux bee venom: Allergy     Comment:  mainly as a child 12/08/1969: Anxiety No date: Arthritis 12/08/1953: Asthma     Comment:  mainly as a child No date: Chronic back pain 11/1999: Colon cancer (HCC)     Comment:  stage 3 s/p colon resection No date: Depression No date: H/O Clostridium difficile infection No date: History of chemotherapy     Comment:  5-FU pump/leukovorin No date: History of kidney stones 04/05/2022: Hypertension No date: Hypothyroidism     Comment:  NO MEDS NOW No date: Insomnia 12/2011: Multiple myeloma (HCC)     Comment:  smoldering vs mgus 08/28/2017: Nephrolithiasis No date: Osteoporosis No date: Seasonal allergies  Past Surgical History: No date: BACK SURGERY 2001: COLON SURGERY     Comment:  resection, 2nd surgery for scar tissue removal No date: COLONOSCOPY 03/26/2016: COLONOSCOPY WITH PROPOFOL ; N/A     Comment:  Procedure: COLONOSCOPY WITH PROPOFOL ;  Surgeon: Gladis RAYMOND Mariner, MD;   Location: [facility] ENDOSCOPY;  Service:               Endoscopy;  Laterality: N/A; 06/16/2021: COLONOSCOPY WITH PROPOFOL ; N/A     Comment:  Procedure: COLONOSCOPY WITH PROPOFOL ;  Surgeon: Therisa Bi, MD;  Location: [facility] ENDOSCOPY;  Service:               Gastroenterology;  Laterality: N/A; 02/21/2018: CYSTOSCOPY WITH URETEROSCOPY AND STENT PLACEMENT; Left     Comment:  Procedure: URETEROSCOPY, LASER LITHOTRIPSY, STONE               REMOVAL AND STENT PLACEMENT;  Surgeon: Twylla Glendia BROCKS,               MD;  Location: [facility] ORS;  Service: Urology;  Laterality:               Left; 09/03/2017: CYSTOSCOPY/URETEROSCOPY/HOLMIUM LASER/STENT PLACEMENT;  Right     Comment:  Procedure: CYSTOSCOPY/URETEROSCOPY/HOLMIUM LASER/STENT               PLACEMENT;  Surgeon: Twylla Glendia BROCKS, MD;  Location:               [facility] ORS;  Service: Urology;  Laterality: Right; 10/2010: ESOPHAGOGASTRODUODENOSCOPY 1994 and 2010: EYE SURGERY     Comment:  RKA in 94 and [place] in 2010 07/06/2018: FLEXIBLE SIGMOIDOSCOPY; N/A     Comment:  Procedure: FLEXIBLE SIGMOIDOSCOPY;  Surgeon: Toledo,               Ladell POUR, MD;  Location: [facility] ENDOSCOPY;  Service:               Gastroenterology;  Laterality: N/A; 1970: HERNIA REPAIR     Comment:  lower left 02/21/2018: HOLMIUM LASER APPLICATION; Left     Comment:  Procedure: HOLMIUM LASER APPLICATION;  Surgeon: Twylla Glendia BROCKS, MD;  Location: [facility] ORS;  Service: Urology;                Laterality: Left; No date: LUMBAR FUSION     Comment:  L4-L5, L5-S1 No date: SACROILIAC JOINT FUSION No date: SHOULDER SURGERY; Left No date: SPINAL CORD STIMULATOR IMPLANT  BMI    Body Mass Index: 20.36 kg/m      Reproductive/Obstetrics negative OB ROS                              Anesthesia Physical Anesthesia Plan  ASA: 3  Anesthesia Plan: MAC   Post-op Pain Management: Minimal or no pain anticipated   Induction: Intravenous  PONV  Risk Score and Plan: 1  Airway Management Planned: Natural Airway and Nasal Cannula  Additional Equipment:   Intra-op Plan:   Post-operative Plan:   Informed Consent: I have reviewed the patients History and Physical, chart, labs and discussed the procedure including the risks, benefits and alternatives for the proposed anesthesia with the patient or authorized representative who has indicated his/her understanding and acceptance.     Dental Advisory Given  Plan Discussed with: Anesthesiologist, CRNA and Surgeon  Anesthesia Plan Comments: (Patient consented for risks of anesthesia including but not limited to:  - adverse reactions to medications - damage to eyes, teeth, lips or other oral mucosa - nerve damage due to positioning  - sore throat or hoarseness - Damage to heart, brain, nerves, lungs, other parts of body or loss of life  Patient voiced understanding and assent.)        Anesthesia Quick Evaluation

## 2023-10-02 NOTE — Transfer of Care (Signed)
 Immediate Anesthesia Transfer of Care Note  Patient: [name]  Procedure(s) Performed: PHACOEMULSIFICATION, CATARACT, WITH IOL INSERTION 2.53 00:27.3 (Right: Eye)  Patient Location: PACU  Anesthesia Type: MAC  Level of Consciousness: awake, alert  and patient cooperative  Airway and Oxygen Therapy: Patient Spontanous Breathing and Patient connected to supplemental oxygen  Post-op Assessment: Post-op Vital signs reviewed, Patient's Cardiovascular Status Stable, Respiratory Function Stable, Patent Airway and No signs of Nausea or vomiting  Post-op Vital Signs: Reviewed and stable  Complications: No notable events documented.

## 2023-10-03 ENCOUNTER — Encounter: Payer: Self-pay | Admitting: Ophthalmology

## 2023-10-14 NOTE — Discharge Instructions (Signed)

## 2023-10-16 ENCOUNTER — Ambulatory Visit: Payer: Self-pay | Admitting: Anesthesiology

## 2023-10-16 ENCOUNTER — Encounter
Admission: RE | Disposition: A | Discharge status: Home / Self Care | Payer: Self-pay | Source: Home / Self Care | Attending: Ophthalmology

## 2023-10-16 ENCOUNTER — Other Ambulatory Visit: Payer: Self-pay

## 2023-10-16 ENCOUNTER — Ambulatory Visit
Admission: RE | Admit: 2023-10-16 | Discharge: 2023-10-16 | Disposition: A | Discharge status: Home / Self Care | Attending: Ophthalmology | Admitting: Ophthalmology

## 2023-10-16 ENCOUNTER — Encounter: Payer: Self-pay | Admitting: Ophthalmology

## 2023-10-16 DIAGNOSIS — Z87891 Personal history of nicotine dependence: Secondary | ICD-10-CM | POA: Insufficient documentation

## 2023-10-16 DIAGNOSIS — I10 Essential (primary) hypertension: Secondary | ICD-10-CM | POA: Insufficient documentation

## 2023-10-16 DIAGNOSIS — Z9841 Cataract extraction status, right eye: Secondary | ICD-10-CM | POA: Insufficient documentation

## 2023-10-16 DIAGNOSIS — H2512 Age-related nuclear cataract, left eye: Secondary | ICD-10-CM | POA: Insufficient documentation

## 2023-10-16 DIAGNOSIS — Z961 Presence of intraocular lens: Secondary | ICD-10-CM | POA: Diagnosis not present

## 2023-10-16 HISTORY — PX: CATARACT EXTRACTION W/PHACO: SHX586

## 2023-10-16 SURGERY — PHACOEMULSIFICATION, CATARACT, WITH IOL INSERTION
Anesthesia: Monitor Anesthesia Care | Site: Eye | Laterality: Left

## 2023-10-16 MED ORDER — LACTATED RINGERS IV SOLN
INTRAVENOUS | Status: DC
Start: 2023-10-16 — End: 2023-10-16

## 2023-10-16 MED ORDER — TETRACAINE HCL 0.5 % OP SOLN
1.0000 [drp] | OPHTHALMIC | Status: DC | PRN
  Administered 2023-10-16 (×3): 1 [drp] via OPHTHALMIC

## 2023-10-16 MED ORDER — LIDOCAINE HCL (PF) 2 % IJ SOLN
INTRAOCULAR | Status: DC | PRN
  Administered 2023-10-16: 2 mL

## 2023-10-16 MED ORDER — TETRACAINE HCL 0.5 % OP SOLN
OPHTHALMIC | Status: AC
  Filled 2023-10-16: qty 4

## 2023-10-16 MED ORDER — BRIMONIDINE TARTRATE-TIMOLOL 0.2-0.5 % OP SOLN
OPHTHALMIC | Status: DC | PRN
  Administered 2023-10-16: 1 [drp] via OPHTHALMIC

## 2023-10-16 MED ORDER — SIGHTPATH DOSE#1 BSS IO SOLN
INTRAOCULAR | Status: DC | PRN
  Administered 2023-10-16: 15 mL via INTRAOCULAR

## 2023-10-16 MED ORDER — ARMC OPHTHALMIC DILATING DROPS
OPHTHALMIC | Status: AC
  Filled 2023-10-16: qty 0.5

## 2023-10-16 MED ORDER — SIGHTPATH DOSE#1 BSS IO SOLN
INTRAOCULAR | Status: DC | PRN
  Administered 2023-10-16: 65 mL via OPHTHALMIC

## 2023-10-16 MED ORDER — CEFUROXIME OPHTHALMIC INJECTION 1 MG/0.1 ML
INJECTION | OPHTHALMIC | Status: DC | PRN
  Administered 2023-10-16: .1 mL via INTRACAMERAL

## 2023-10-16 MED ORDER — MIDAZOLAM HCL 2 MG/2ML IJ SOLN
INTRAMUSCULAR | Status: AC
  Filled 2023-10-16: qty 2

## 2023-10-16 MED ORDER — FENTANYL CITRATE (PF) 100 MCG/2ML IJ SOLN
INTRAMUSCULAR | Status: DC | PRN
  Administered 2023-10-16: 50 ug via INTRAVENOUS

## 2023-10-16 MED ORDER — FENTANYL CITRATE (PF) 100 MCG/2ML IJ SOLN
INTRAMUSCULAR | Status: AC
  Filled 2023-10-16: qty 2

## 2023-10-16 MED ORDER — SIGHTPATH DOSE#1 NA HYALUR & NA CHOND-NA HYALUR IO KIT
PACK | INTRAOCULAR | Status: DC | PRN
  Administered 2023-10-16: 1 via OPHTHALMIC

## 2023-10-16 MED ORDER — MIDAZOLAM HCL 2 MG/2ML IJ SOLN
INTRAMUSCULAR | Status: DC | PRN
  Administered 2023-10-16: 2 mg via INTRAVENOUS

## 2023-10-16 MED ORDER — ARMC OPHTHALMIC DILATING DROPS
1.0000 | OPHTHALMIC | Status: DC | PRN
  Administered 2023-10-16 (×3): 1 via OPHTHALMIC

## 2023-10-16 SURGICAL SUPPLY — 8 items
FEE CATARACT SUITE SIGHTPATH (MISCELLANEOUS) ×1 IMPLANT
GLOVE BIOGEL PI IND STRL 8 (GLOVE) ×1 IMPLANT
GLOVE SURG LX STRL 7.5 STRW (GLOVE) ×1 IMPLANT
GLOVE SURG SYN 6.5 PF PI BL (GLOVE) ×1 IMPLANT
LENS IOL TECNIS EYHANCE 22.5 (Intraocular Lens) IMPLANT
NDL FILTER BLUNT 18X1 1/2 (NEEDLE) ×1 IMPLANT
NEEDLE FILTER BLUNT 18X1 1/2 (NEEDLE) ×1 IMPLANT
SYR 3ML LL SCALE MARK (SYRINGE) ×1 IMPLANT

## 2023-10-16 NOTE — Op Note (Signed)
 OPERATIVE NOTE  [name] [id number] 10/16/2023   PREOPERATIVE DIAGNOSIS:  Nuclear sclerotic cataract left eye. H25.12   POSTOPERATIVE DIAGNOSIS:    Nuclear sclerotic cataract left eye.     PROCEDURE:  Phacoemusification with posterior chamber intraocular lens placement of the left eye  Ultrasound time: Procedure(s): PHACOEMULSIFICATION, CATARACT, WITH IOL INSERTION 4.75 00:32.3 (Left)  LENS:   Implant Name Type Inv. Item Serial No. Manufacturer Lot No. LRB No. Used Action  LENS IOL TECNIS EYHANCE 22.5 - [id number] Intraocular Lens LENS IOL TECNIS EYHANCE 22.5 [id number] [employer]  Left 1 Implanted      SURGEON:  Dene FABIENE Etienne, MD   ANESTHESIA:  Topical with tetracaine  drops and 2% Xylocaine  jelly, augmented with 1% preservative-free intracameral lidocaine .    COMPLICATIONS:  None.   DESCRIPTION OF PROCEDURE:  The patient was identified in the holding room and transported to the operating room and placed in the supine position under the operating microscope.  The left eye was identified as the operative eye and it was prepped and draped in the usual sterile ophthalmic fashion.   A 1 millimeter clear-corneal paracentesis was made at the 1:30 position.  0.5 ml of preservative-free 1% lidocaine  was injected into the anterior chamber.  The anterior chamber was filled with Viscoat viscoelastic.  A 2.4 millimeter keratome was used to make a near-clear corneal incision at the 10:30 position.  .  A curvilinear capsulorrhexis was made with a cystotome and capsulorrhexis forceps.  Balanced salt solution was used to hydrodissect and hydrodelineate the nucleus.   Phacoemulsification was then used in stop and chop fashion to remove the lens nucleus and epinucleus.  The remaining cortex was then removed using the irrigation and aspiration handpiece. Provisc was then placed into the capsular bag to distend it for lens placement.  A lens was then injected into the capsular bag.  The  remaining viscoelastic was aspirated.   Wounds were hydrated with balanced salt solution.  The anterior chamber was inflated to a physiologic pressure with balanced salt solution.  No wound leaks were noted. Cefuroxime  0.1 ml of a 10mg /ml solution was injected into the anterior chamber for a dose of 1 mg of intracameral antibiotic at the completion of the case.   Timolol  and Brimonidine  drops were applied to the eye.  The patient was taken to the recovery room in stable condition without complications of anesthesia or surgery.  Jaquelyn Sakamoto 10/16/2023, 11:18 AM

## 2023-10-16 NOTE — Anesthesia Preprocedure Evaluation (Signed)
 Anesthesia Evaluation  Patient identified by MRN, date of birth, ID band Patient awake    Reviewed: Allergy & Precautions, H&P , NPO status , Patient's Chart, lab work & pertinent test results  Airway Mallampati: III  TM Distance: >3 FB Neck ROM: Full    Dental no notable dental hx.    Pulmonary neg pulmonary ROS, asthma , former smoker   Pulmonary exam normal breath sounds clear to auscultation       Cardiovascular hypertension, negative cardio ROS Normal cardiovascular exam Rhythm:Regular Rate:Normal     Neuro/Psych  PSYCHIATRIC DISORDERS Anxiety Depression    negative neurological ROS  negative psych ROS   GI/Hepatic negative GI ROS, Neg liver ROS,GERD  ,,  Endo/Other  negative endocrine ROSHypothyroidism    Renal/GU Renal diseasenegative Renal ROS  negative genitourinary   Musculoskeletal negative musculoskeletal ROS (+) Arthritis ,    Abdominal   Peds negative pediatric ROS (+)  Hematology negative hematology ROS (+)   Anesthesia Other Findings Previous cataract surgery 10-02-23 Dr. Chesley  Chronic back pain  Insomnia Arthritis  Abnormal laboratory test Acid reflux  Seasonal allergies History of chemotherapy Osteoporosis Depression  Allergy Anxiety  Asthma Nephrolithiasis  History of kidney stones H/O Clostridium difficile infection Colon cancer (HCC) Multiple myeloma (HCC) Hypothyroidism Hypertension      Reproductive/Obstetrics negative OB ROS                              Anesthesia Physical Anesthesia Plan  ASA: 3  Anesthesia Plan: MAC   Post-op Pain Management:    Induction: Intravenous  PONV Risk Score and Plan:   Airway Management Planned: Natural Airway and Nasal Cannula  Additional Equipment:   Intra-op Plan:   Post-operative Plan:   Informed Consent: I have reviewed the patients History and Physical, chart, labs and discussed the procedure  including the risks, benefits and alternatives for the proposed anesthesia with the patient or authorized representative who has indicated his/her understanding and acceptance.     Dental Advisory Given  Plan Discussed with: Anesthesiologist, CRNA and Surgeon  Anesthesia Plan Comments: (Patient consented for risks of anesthesia including but not limited to:  - adverse reactions to medications - damage to eyes, teeth, lips or other oral mucosa - nerve damage due to positioning  - sore throat or hoarseness - Damage to heart, brain, nerves, lungs, other parts of body or loss of life  Patient voiced understanding and assent.)         Anesthesia Quick Evaluation

## 2023-10-16 NOTE — Transfer of Care (Signed)
 Immediate Anesthesia Transfer of Care Note  Patient: MASSIE MEES  Procedure(s) Performed: PHACOEMULSIFICATION, CATARACT, WITH IOL INSERTION 4.75 00:32.3 (Left: Eye)  Patient Location: PACU  Anesthesia Type: MAC  Level of Consciousness: awake, alert  and patient cooperative  Airway and Oxygen Therapy: Patient Spontanous Breathing and Patient connected to supplemental oxygen  Post-op Assessment: Post-op Vital signs reviewed, Patient's Cardiovascular Status Stable, Respiratory Function Stable, Patent Airway and No signs of Nausea or vomiting  Post-op Vital Signs: Reviewed and stable  Complications: No notable events documented.

## 2023-10-16 NOTE — H&P (Signed)
 [facility]   Primary Care Physician:  Leavy Mole, PA-C Ophthalmologist: Dr. Dene Etienne  Pre-Procedure History & Physical: HPI:  [name] is a 73 y.o. male here for ophthalmic surgery.   Past Medical History:  Diagnosis Date   Abnormal laboratory test    Sees Dr. Marina   Acid reflux    Allergy bee venom   mainly as a child   Anxiety 12/08/1969   Arthritis    Asthma 12/08/1953   mainly as a child   Chronic back pain    Colon cancer (HCC) 11/1999   stage 3 s/p colon resection   Depression    H/O Clostridium difficile infection    History of chemotherapy    5-FU pump/leukovorin   History of kidney stones    Hypertension 04/05/2022   Hypothyroidism    NO MEDS NOW   Insomnia    Multiple myeloma (HCC) 12/2011   smoldering vs mgus   Nephrolithiasis 08/28/2017   Osteoporosis    Seasonal allergies     Past Surgical History:  Procedure Laterality Date   BACK SURGERY     CATARACT EXTRACTION W/PHACO Right 10/02/2023   Procedure: PHACOEMULSIFICATION, CATARACT, WITH IOL INSERTION 2.53 00:27.3;  Surgeon: Etienne Dene, MD;  Location: [facility];  Service: Ophthalmology;  Laterality: Right;   COLON SURGERY  2001   resection, 2nd surgery for scar tissue removal   COLONOSCOPY     COLONOSCOPY WITH PROPOFOL  N/A 03/26/2016   Procedure: COLONOSCOPY WITH PROPOFOL ;  Surgeon: Gladis RAYMOND Mariner, MD;  Location: [facility] ENDOSCOPY;  Service: Endoscopy;  Laterality: N/A;   COLONOSCOPY WITH PROPOFOL  N/A 06/16/2021   Procedure: COLONOSCOPY WITH PROPOFOL ;  Surgeon: Therisa Bi, MD;  Location: [facility] ENDOSCOPY;  Service: Gastroenterology;  Laterality: N/A;   CYSTOSCOPY WITH URETEROSCOPY AND STENT PLACEMENT Left 02/21/2018   Procedure: URETEROSCOPY, LASER LITHOTRIPSY, STONE REMOVAL AND STENT PLACEMENT;  Surgeon: Twylla Glendia BROCKS, MD;  Location: [facility] ORS;  Service: Urology;  Laterality: Left;   CYSTOSCOPY/URETEROSCOPY/HOLMIUM LASER/STENT PLACEMENT Right 09/03/2017    Procedure: CYSTOSCOPY/URETEROSCOPY/HOLMIUM LASER/STENT PLACEMENT;  Surgeon: Twylla Glendia BROCKS, MD;  Location: [facility] ORS;  Service: Urology;  Laterality: Right;   ESOPHAGOGASTRODUODENOSCOPY  10/2010   EYE SURGERY  1994 and 2010   RKA in 94 and [place] in 2010   FLEXIBLE SIGMOIDOSCOPY N/A 07/06/2018   Procedure: FLEXIBLE SIGMOIDOSCOPY;  Surgeon: Toledo, Ladell POUR, MD;  Location: [facility] ENDOSCOPY;  Service: Gastroenterology;  Laterality: N/A;   HERNIA REPAIR  1970   lower left   HOLMIUM LASER APPLICATION Left 02/21/2018   Procedure: HOLMIUM LASER APPLICATION;  Surgeon: Twylla Glendia BROCKS, MD;  Location: [facility] ORS;  Service: Urology;  Laterality: Left;   LUMBAR FUSION     L4-L5, L5-S1   SACROILIAC JOINT FUSION     SHOULDER SURGERY Left    SPINAL CORD STIMULATOR IMPLANT      Prior to Admission medications   Medication Sig Start Date End Date Taking? Authorizing Provider  acetaminophen  (TYLENOL ) 650 MG CR tablet Take 1,300 mg by mouth every 8 (eight) hours as needed for pain.   Yes [provider]  cholecalciferol  (VITAMIN D3) 25 MCG (1000 UT) tablet Take 2,000 Units by mouth daily.    Yes [provider]  Multiple Vitamins-Minerals (CENTRUM SILVER ULTRA MENS) TABS Take 1 tablet by mouth daily.   Yes [provider]  tiZANidine  (ZANAFLEX ) 4 MG tablet Take 0.5-1.5 tablets (2-6 mg total) by mouth every 8 (eight) hours as needed for muscle spasms (muscle tightness). 04/05/22  Yes Tapia, Leisa, PA-C  Vibegron  (GEMTESA ) 75 MG TABS Take 1 tablet (75 mg total) by mouth daily. 09/11/23  Yes Francisca Redell BROCKS, MD    Allergies as of 08/29/2023 - Review Complete 08/19/2023  Allergen Reaction Noted   Bee venom Swelling 09/04/2014    Family History  Problem Relation Age of Onset   Lymphoma Mother    Arthritis Mother        deceased   Cancer Mother    Lung cancer Father    Alcohol abuse Father        deceased   Cancer Father    Heart disease Other        grandparents    Hyperlipidemia Sister    Diabetes Brother    Diabetes Son    Alcohol abuse Brother    Arthritis Brother    Depression Brother    Diabetes Brother    Alcohol abuse Sister    Arthritis Sister    Depression Sister    Alcohol abuse Son    Early death Son        truck accident 30 at age 36    Social History   Socioeconomic History   Marital status: Married    Spouse name: Not on file   Number of children: 2   Years of education: Not on file   Highest education level: 12th grade  Occupational History   Occupation: Retired  Tobacco Use   Smoking status: Former    Current packs/day: 0.00    Average packs/day: 1 pack/day for 10.0 years (10.0 ttl pk-yrs)    Types: Cigarettes    Start date: 03/06/1960    Quit date: 03/06/1970    Years since quitting: 53.6   Smokeless tobacco: Never   Tobacco comments:    started at age 49 and quit at age 13  Vaping Use   Vaping status: Never Used  Substance and Sexual Activity   Alcohol use: No   Drug use: No   Sexual activity: Yes    Birth control/protection: None  Other Topics Concern   Not on file  Social History Narrative   Pt is an avid golfer and bowls every week   Social Drivers of Corporate investment banker Strain: Low Risk  (08/15/2023)   Overall Financial Resource Strain (CARDIA)    Difficulty of Paying Living Expenses: Not hard at all  Food Insecurity: No Food Insecurity (08/15/2023)   Hunger Vital Sign    Worried About Running Out of Food in the Last Year: Never true    Ran Out of Food in the Last Year: Never true  Transportation Needs: No Transportation Needs (08/15/2023)   PRAPARE - Administrator, Civil Service (Medical): No    Lack of Transportation (Non-Medical): No  Physical Activity: Insufficiently Active (08/15/2023)   Exercise Vital Sign    Days of Exercise per Week: 1 day    Minutes of Exercise per Session: 60 min  Stress: Stress Concern Present (08/15/2023)   [employer] of Occupational Health  - Occupational Stress Questionnaire    Feeling of Stress: To some extent  Social Connections: Socially Integrated (08/15/2023)   Social Connection and Isolation Panel    Frequency of Communication with Friends and Family: More than three times a week    Frequency of Social Gatherings with Friends and Family: Three times a week    Attends Religious Services: More than 4 times per year    Active Member of Clubs or Organizations:  Yes    Attends Club or Organization Meetings: More than 4 times per year    Marital Status: Married  Catering manager Violence: Not At Risk (05/30/2023)   Humiliation, Afraid, Rape, and Kick questionnaire    Fear of Current or Ex-Partner: No    Emotionally Abused: No    Physically Abused: No    Sexually Abused: No    Review of Systems: See HPI, otherwise negative ROS  Physical Exam: BP (!) 164/99   Pulse 89   Temp 97.9 F (36.6 C) (Temporal)   Resp 16   Ht 5' 7 (1.702 m)   Wt 59 kg   SpO2 96%   BMI 20.36 kg/m  General:   Alert,  pleasant and cooperative in NAD Head:  Normocephalic and atraumatic. Lungs:  Clear to auscultation.    Heart:  Regular rate and rhythm.   Impression/Plan: [name] is here for ophthalmic surgery.  Risks, benefits, limitations, and alternatives regarding ophthalmic surgery have been reviewed with the patient.  Questions have been answered.  All parties agreeable.   MITTIE GASKIN, MD  10/16/2023, 10:38 AM

## 2023-10-16 NOTE — Anesthesia Postprocedure Evaluation (Signed)
 Anesthesia Post Note  Patient: [name]  Procedure(s) Performed: PHACOEMULSIFICATION, CATARACT, WITH IOL INSERTION 4.75 00:32.3 (Left: Eye)  Patient location during evaluation: PACU Anesthesia Type: MAC Level of consciousness: awake and alert Pain management: pain level controlled Vital Signs Assessment: post-procedure vital signs reviewed and stable Respiratory status: spontaneous breathing, nonlabored ventilation, respiratory function stable and patient connected to nasal cannula oxygen Cardiovascular status: stable and blood pressure returned to baseline Postop Assessment: no apparent nausea or vomiting Anesthetic complications: no   No notable events documented.   Last Vitals:  Vitals:   10/16/23 1120 10/16/23 1124  BP: (!) 146/85 137/82  Pulse: 73 80  Resp: 14 15  Temp: (!) 36.3 C   SpO2: 98% 96%    Last Pain:  Vitals:   10/16/23 1124  TempSrc:   PainSc: 0-No pain                 Miyo Aina C Dayron Odland

## 2023-10-17 ENCOUNTER — Encounter: Payer: Self-pay | Admitting: Emergency Medicine

## 2023-10-17 ENCOUNTER — Ambulatory Visit: Payer: Self-pay

## 2023-10-17 ENCOUNTER — Inpatient Hospital Stay
Admission: EM | Admit: 2023-10-17 | Discharge: 2023-10-22 | DRG: 389 | Disposition: A | Discharge status: Home / Self Care | Attending: Internal Medicine | Admitting: Internal Medicine

## 2023-10-17 ENCOUNTER — Emergency Department

## 2023-10-17 ENCOUNTER — Other Ambulatory Visit: Payer: Self-pay

## 2023-10-17 DIAGNOSIS — Z8249 Family history of ischemic heart disease and other diseases of the circulatory system: Secondary | ICD-10-CM

## 2023-10-17 DIAGNOSIS — E860 Dehydration: Secondary | ICD-10-CM | POA: Diagnosis present

## 2023-10-17 DIAGNOSIS — Z801 Family history of malignant neoplasm of trachea, bronchus and lung: Secondary | ICD-10-CM

## 2023-10-17 DIAGNOSIS — Z807 Family history of other malignant neoplasms of lymphoid, hematopoietic and related tissues: Secondary | ICD-10-CM | POA: Diagnosis not present

## 2023-10-17 DIAGNOSIS — Z9682 Presence of neurostimulator: Secondary | ICD-10-CM

## 2023-10-17 DIAGNOSIS — N179 Acute kidney failure, unspecified: Secondary | ICD-10-CM | POA: Diagnosis present

## 2023-10-17 DIAGNOSIS — M1990 Unspecified osteoarthritis, unspecified site: Secondary | ICD-10-CM | POA: Diagnosis present

## 2023-10-17 DIAGNOSIS — Z91030 Bee allergy status: Secondary | ICD-10-CM

## 2023-10-17 DIAGNOSIS — Z87891 Personal history of nicotine dependence: Secondary | ICD-10-CM

## 2023-10-17 DIAGNOSIS — Z9221 Personal history of antineoplastic chemotherapy: Secondary | ICD-10-CM | POA: Diagnosis not present

## 2023-10-17 DIAGNOSIS — K56609 Unspecified intestinal obstruction, unspecified as to partial versus complete obstruction: Secondary | ICD-10-CM | POA: Diagnosis not present

## 2023-10-17 DIAGNOSIS — Z85038 Personal history of other malignant neoplasm of large intestine: Secondary | ICD-10-CM | POA: Diagnosis not present

## 2023-10-17 DIAGNOSIS — Z811 Family history of alcohol abuse and dependence: Secondary | ICD-10-CM

## 2023-10-17 DIAGNOSIS — Z8261 Family history of arthritis: Secondary | ICD-10-CM

## 2023-10-17 DIAGNOSIS — E039 Hypothyroidism, unspecified: Secondary | ICD-10-CM | POA: Diagnosis present

## 2023-10-17 DIAGNOSIS — E876 Hypokalemia: Secondary | ICD-10-CM | POA: Diagnosis not present

## 2023-10-17 DIAGNOSIS — Z833 Family history of diabetes mellitus: Secondary | ICD-10-CM | POA: Diagnosis not present

## 2023-10-17 DIAGNOSIS — H2512 Age-related nuclear cataract, left eye: Secondary | ICD-10-CM | POA: Diagnosis present

## 2023-10-17 DIAGNOSIS — Z981 Arthrodesis status: Secondary | ICD-10-CM

## 2023-10-17 DIAGNOSIS — Z9049 Acquired absence of other specified parts of digestive tract: Secondary | ICD-10-CM

## 2023-10-17 DIAGNOSIS — R109 Unspecified abdominal pain: Secondary | ICD-10-CM | POA: Diagnosis present

## 2023-10-17 DIAGNOSIS — D472 Monoclonal gammopathy: Secondary | ICD-10-CM | POA: Diagnosis present

## 2023-10-17 DIAGNOSIS — N400 Enlarged prostate without lower urinary tract symptoms: Secondary | ICD-10-CM | POA: Diagnosis present

## 2023-10-17 DIAGNOSIS — M810 Age-related osteoporosis without current pathological fracture: Secondary | ICD-10-CM | POA: Diagnosis present

## 2023-10-17 DIAGNOSIS — Z87442 Personal history of urinary calculi: Secondary | ICD-10-CM

## 2023-10-17 DIAGNOSIS — I129 Hypertensive chronic kidney disease with stage 1 through stage 4 chronic kidney disease, or unspecified chronic kidney disease: Secondary | ICD-10-CM | POA: Diagnosis present

## 2023-10-17 DIAGNOSIS — I700 Atherosclerosis of aorta: Secondary | ICD-10-CM | POA: Diagnosis present

## 2023-10-17 DIAGNOSIS — Z8349 Family history of other endocrine, nutritional and metabolic diseases: Secondary | ICD-10-CM

## 2023-10-17 DIAGNOSIS — N1831 Chronic kidney disease, stage 3a: Secondary | ICD-10-CM | POA: Diagnosis present

## 2023-10-17 DIAGNOSIS — Z809 Family history of malignant neoplasm, unspecified: Secondary | ICD-10-CM

## 2023-10-17 DIAGNOSIS — Z818 Family history of other mental and behavioral disorders: Secondary | ICD-10-CM

## 2023-10-17 DIAGNOSIS — Z79899 Other long term (current) drug therapy: Secondary | ICD-10-CM

## 2023-10-17 DIAGNOSIS — K9131 Postprocedural partial intestinal obstruction: Secondary | ICD-10-CM | POA: Diagnosis present

## 2023-10-17 DIAGNOSIS — K56600 Partial intestinal obstruction, unspecified as to cause: Principal | ICD-10-CM | POA: Diagnosis present

## 2023-10-17 LAB — HEPATIC FUNCTION PANEL
ALT: 21 U/L (ref 0–44)
AST: 30 U/L (ref 15–41)
Albumin: 4.9 g/dL (ref 3.5–5.0)
Alkaline Phosphatase: 61 U/L (ref 38–126)
Bilirubin, Direct: 0.1 mg/dL (ref 0.0–0.2)
Indirect Bilirubin: 1.1 mg/dL — ABNORMAL HIGH (ref 0.3–0.9)
Total Bilirubin: 1.2 mg/dL (ref 0.0–1.2)
Total Protein: 8.7 g/dL — ABNORMAL HIGH (ref 6.5–8.1)

## 2023-10-17 LAB — CBC
HCT: 48.5 % (ref 39.0–52.0)
Hemoglobin: 16.9 g/dL (ref 13.0–17.0)
MCH: 32.3 pg (ref 26.0–34.0)
MCHC: 34.8 g/dL (ref 30.0–36.0)
MCV: 92.6 fL (ref 80.0–100.0)
Platelets: 495 K/uL — ABNORMAL HIGH (ref 150–400)
RBC: 5.24 MIL/uL (ref 4.22–5.81)
RDW: 12.2 % (ref 11.5–15.5)
WBC: 14.5 K/uL — ABNORMAL HIGH (ref 4.0–10.5)
nRBC: 0 % (ref 0.0–0.2)

## 2023-10-17 LAB — BASIC METABOLIC PANEL WITH GFR
Anion gap: 17 — ABNORMAL HIGH (ref 5–15)
BUN: 33 mg/dL — ABNORMAL HIGH (ref 8–23)
CO2: 28 mmol/L (ref 22–32)
Calcium: 10.7 mg/dL — ABNORMAL HIGH (ref 8.9–10.3)
Chloride: 93 mmol/L — ABNORMAL LOW (ref 98–111)
Creatinine, Ser: 1.57 mg/dL — ABNORMAL HIGH (ref 0.61–1.24)
GFR, Estimated: 46 mL/min — ABNORMAL LOW (ref >60)
Glucose, Bld: 198 mg/dL — ABNORMAL HIGH (ref 70–99)
Potassium: 4 mmol/L (ref 3.5–5.1)
Sodium: 138 mmol/L (ref 135–145)

## 2023-10-17 LAB — LIPASE, BLOOD: Lipase: 38 U/L (ref 11–51)

## 2023-10-17 LAB — MAGNESIUM: Magnesium: 2.4 mg/dL (ref 1.7–2.4)

## 2023-10-17 MED ORDER — VITAMIN D 25 MCG (1000 UNIT) PO TABS
2000.0000 [IU] | ORAL_TABLET | Freq: Every day | ORAL | Status: DC
  Administered 2023-10-18 – 2023-10-22 (×5): 2000 [IU] via ORAL
  Filled 2023-10-17 (×5): qty 2

## 2023-10-17 MED ORDER — ACETAMINOPHEN 325 MG PO TABS: 650.0000 mg | ORAL_TABLET | Freq: Four times a day (QID) | ORAL | Status: DC | PRN

## 2023-10-17 MED ORDER — MORPHINE SULFATE (PF) 4 MG/ML IV SOLN
4.0000 mg | Freq: Once | INTRAVENOUS | Status: AC
  Administered 2023-10-17: 4 mg via INTRAVENOUS
  Filled 2023-10-17: qty 1

## 2023-10-17 MED ORDER — ONDANSETRON HCL 4 MG/2ML IJ SOLN
4.0000 mg | Freq: Four times a day (QID) | INTRAMUSCULAR | Status: DC | PRN
  Administered 2023-10-17 – 2023-10-20 (×10): 4 mg via INTRAVENOUS
  Filled 2023-10-17 (×11): qty 2

## 2023-10-17 MED ORDER — VITAMIN D 25 MCG (1000 UNIT) PO TABS: 2000.0000 [IU] | ORAL_TABLET | Freq: Every day | ORAL | Status: DC

## 2023-10-17 MED ORDER — DEXTROSE IN LACTATED RINGERS 5 % IV SOLN
INTRAVENOUS | Status: AC
Start: 2023-10-17 — End: 2023-10-18

## 2023-10-17 MED ORDER — LACTATED RINGERS IV BOLUS
1000.0000 mL | Freq: Once | INTRAVENOUS | Status: AC
  Administered 2023-10-17: 1000 mL via INTRAVENOUS

## 2023-10-17 MED ORDER — HYDROMORPHONE HCL 1 MG/ML IJ SOLN
1.0000 mg | INTRAMUSCULAR | Status: DC | PRN
  Administered 2023-10-17 – 2023-10-21 (×18): 1 mg via INTRAVENOUS
  Filled 2023-10-17 (×18): qty 1

## 2023-10-17 MED ORDER — HYDROMORPHONE HCL 1 MG/ML IJ SOLN
1.0000 mg | Freq: Once | INTRAMUSCULAR | Status: AC
  Administered 2023-10-17: 1 mg via INTRAVENOUS
  Filled 2023-10-17: qty 1

## 2023-10-17 MED ORDER — MIRABEGRON ER 25 MG PO TB24: 25.0000 mg | ORAL_TABLET | Freq: Every day | ORAL | Status: DC

## 2023-10-17 MED ORDER — ACETAMINOPHEN ER 650 MG PO TBCR: 1300.0000 mg | EXTENDED_RELEASE_TABLET | Freq: Three times a day (TID) | ORAL | Status: DC | PRN

## 2023-10-17 MED ORDER — ENOXAPARIN SODIUM 40 MG/0.4ML IJ SOSY
40.0000 mg | PREFILLED_SYRINGE | INTRAMUSCULAR | Status: DC
  Administered 2023-10-17 – 2023-10-21 (×5): 40 mg via SUBCUTANEOUS
  Filled 2023-10-17 (×5): qty 0.4

## 2023-10-17 MED ORDER — MIRABEGRON ER 25 MG PO TB24
25.0000 mg | ORAL_TABLET | Freq: Every day | ORAL | Status: DC
  Administered 2023-10-18 – 2023-10-22 (×5): 25 mg via ORAL
  Filled 2023-10-17 (×5): qty 1

## 2023-10-17 MED ORDER — TIZANIDINE HCL 2 MG PO TABS: 4.0000 mg | ORAL_TABLET | Freq: Three times a day (TID) | ORAL | Status: DC | PRN

## 2023-10-17 MED ORDER — DIPHENHYDRAMINE HCL 50 MG/ML IJ SOLN
25.0000 mg | Freq: Four times a day (QID) | INTRAMUSCULAR | Status: DC | PRN
  Administered 2023-10-17 – 2023-10-20 (×9): 25 mg via INTRAVENOUS
  Filled 2023-10-17 (×8): qty 1

## 2023-10-17 MED ORDER — ONDANSETRON HCL 4 MG PO TABS: 4.0000 mg | ORAL_TABLET | Freq: Four times a day (QID) | ORAL | Status: DC | PRN

## 2023-10-17 MED ORDER — ONDANSETRON HCL 4 MG/2ML IJ SOLN
4.0000 mg | Freq: Once | INTRAMUSCULAR | Status: AC
  Administered 2023-10-17: 4 mg via INTRAVENOUS
  Filled 2023-10-17: qty 2

## 2023-10-17 NOTE — Telephone Encounter (Signed)
 FYI Only or Action Required?: FYI only for provider.  Patient was last seen in primary care on 08/15/2023 by [name], [name].  Called [name] reporting Vomiting.  Symptoms began last night at 9pm.  Interventions attempted: Rest, hydration, or home remedies.  Symptoms are: gradually worsening.  [name] Disposition: Go to ED Now (or PCP [name])  Patient/caregiver understands and will follow disposition?: Yes    Copied from CRM #[id number]. Topic: Clinical - Red Word [name] >> Oct 17, 2023  9:20 AM [name] wrote: [name] that prompted transfer to [name]: patient called said he had cataract surgery on yesterday and shortly after he ate he started feeling nauseous. Around 10pm last night he started vomiting and it continued every hour all through the night. He said he felt a pain in his upper right side of his abdomen along with sweats but his body felt like it was a hot flash. No fever. Reason for Disposition  [1] SEVERE vomiting (e.g., 6 or more times/day) AND [2] present > 8 hours (Exception: Patient sounds well, is drinking liquids, does not sound dehydrated, and vomiting has lasted less than 24 hours.)  Answer Assessment - Initial Assessment Questions 2nd cataract surgery yesterday on left eye  Ate dinner then felt sick around 9pm last night [name] stats his vomit was yellow Patient states he vomited 25 times Last time he vomited was around 4am Pt states he had hot flashes as well   Patient also states having back pain History of kidney stones All he has been able to intake is two sips of Gatorade Weakness  Patient is going this afternoon for post op appointment  Patient advised that the recommendation is for him to go to the Emergency Room Patient is very understanding and states that he was planning on going to the ER if no appointment was available anyway---[name] is with him He is advised that going to the ER is recommended at this time Patient  understanding Patient also advised that if anything worsens on the way there they can call 911 Patient verbalized understanding.   1. VOMITING SEVERITY: How many times have you vomited in the past 24 hours?      Patient states 25 times 2. ONSET: When did the vomiting begin?      9pm last night 3. FLUIDS: What fluids or food have you vomited up today? Have you been able to keep any fluids down?     Just twp sips of Gatorade 4. ABDOMEN PAIN: Are your having any abdomen pain? If Yes : How bad is it and what does it feel like? (e.g., crampy, dull, intermittent, constant)      Pain right upper side of abdomen 5. DIARRHEA: Is there any diarrhea? If Yes, ask: How many times today?      --- 6. CONTACTS: Is there anyone else in the family with the same symptoms?      ----- 7. CAUSE: What do you think is causing your vomiting?     unsure 8. HYDRATION STATUS: Any signs of dehydration? (e.g., dry mouth [not only dry lips], too weak to stand) When did you last urinate?     Pt states he feels dehydrated  he advised that he hasn'[name] eaten anything and only had two sips of gatorade 9. OTHER SYMPTOMS: Do you have any other symptoms? (e.g., fever, headache, vertigo, vomiting blood or coffee grounds, recent head injury)     weakness  Protocols used: Vomiting-A-AH

## 2023-10-17 NOTE — ED Provider Notes (Signed)
 [facility] Provider Note    Event Date/Time   First MD Initiated Contact with Patient 10/17/23 1607     (approximate)   History   Flank Pain   HPI  [name] is a 73 y.o. male with PMH of multiple myeloma, colon cancer, hypertension, CKD and kidney stones presents for evaluation of right sided flank pain and profuse vomiting.  Patient states that the pain began last night and he says he thinks he has thrown up at least 25 times.  States that the vomiting stopped around 4 AM this morning but he has continued to feel nauseous and very uncomfortable.  He did have a bowel movement this morning.  No diarrhea or constipation.  No fevers.      Physical Exam   Triage Vital Signs: ED Triage Vitals  Encounter Vitals Group     BP 10/17/23 1507 (!) 136/102     Girls Systolic BP Percentile --      Girls Diastolic BP Percentile --      Boys Systolic BP Percentile --      Boys Diastolic BP Percentile --      Pulse Rate 10/17/23 1507 95     Resp 10/17/23 1507 17     Temp 10/17/23 1507 98.8 F (37.1 C)     Temp Source 10/17/23 1507 Oral     SpO2 10/17/23 1507 100 %     Weight 10/17/23 1505 130 lb (59 kg)     Height 10/17/23 1505 5' 7 (1.702 m)     Head Circumference --      Peak Flow --      Pain Score 10/17/23 1505 10     Pain Loc --      Pain Education --      Exclude from Growth Chart --     Most recent vital signs: Vitals:   10/17/23 1507  BP: (!) 136/102  Pulse: 95  Resp: 17  Temp: 98.8 F (37.1 C)  SpO2: 100%   General: Awake, no distress.  CV:  Good peripheral perfusion. RRR. Resp:  Normal effort. CTAB. Abd:  Mild distention. Soft, generalized tenderness to palpation, no CVA tenderness. Other:     ED Results / Procedures / Treatments   Labs (all labs ordered are listed, but only abnormal results are displayed) Labs Reviewed  BASIC METABOLIC PANEL WITH GFR - Abnormal; Notable for the following components:      Result Value    Chloride 93 (*)    Glucose, Bld 198 (*)    BUN 33 (*)    Creatinine, Ser 1.57 (*)    Calcium  10.7 (*)    GFR, Estimated 46 (*)    Anion gap 17 (*)    All other components within normal limits  CBC - Abnormal; Notable for the following components:   WBC 14.5 (*)    Platelets 495 (*)    All other components within normal limits  HEPATIC FUNCTION PANEL - Abnormal; Notable for the following components:   Total Protein 8.7 (*)    Indirect Bilirubin 1.1 (*)    All other components within normal limits  LIPASE, BLOOD  URINALYSIS, ROUTINE W REFLEX MICROSCOPIC  CBC  CREATININE, SERUM  BASIC METABOLIC PANEL WITH GFR  CBC  MAGNESIUM    RADIOLOGY  CT renal stone study obtained, interpreted the images as well as reviewed the radiologist report which shows a partial small bowel obstruction with transition zone noted within the mid left  abdomen.  There is nonobstructing left renal calculi.  Further interpretation below.  IMPRESSION:  1. Findings consistent with a partial small bowel obstruction with a  transition zone noted within the mid left abdomen.  2. Nonobstructing left renal calculi.  3. Moderate to marked severity prostate gland enlargement.  Correlation with PSA levels is recommended.  4. 4 mm right lower lobe pulmonary nodule. Follow-up with  nonemergent dedicated chest CT is recommended.  5. Aortic atherosclerosis.   PROCEDURES:  Critical Care performed: No  Procedures   MEDICATIONS ORDERED IN ED: Medications  lactated ringers  bolus 1,000 mL (has no administration in time range)  enoxaparin  (LOVENOX ) injection 40 mg (has no administration in time range)  dextrose  5 % in lactated ringers  infusion (has no administration in time range)  ondansetron  (ZOFRAN ) tablet 4 mg (has no administration in time range)    Or  ondansetron  (ZOFRAN ) injection 4 mg (has no administration in time range)  ondansetron  (ZOFRAN ) injection 4 mg (4 mg Intravenous Given 10/17/23 1709)  morphine   (PF) 4 MG/ML injection 4 mg (4 mg Intravenous Given 10/17/23 1710)     IMPRESSION / MDM / ASSESSMENT AND PLAN / ED COURSE  I reviewed the triage vital signs and the nursing notes.                             73 year old male presents for evaluation of right sided flank pain and vomiting.  Blood pressure is elevated otherwise vital signs are stable.  Patient does appear quite uncomfortable on exam.  Differential diagnosis includes, but is not limited to, biliary disease, pancreatitis, gastroenteritis, ureterolithiasis, pyelonephritis, bowel obstruction, appendicitis, diverticulitis.  Patient's presentation is most consistent with acute complicated illness / injury requiring diagnostic workup.  CBC shows elevated white count at 14.5.  BMP shows hypochloremia, elevated glucose, elevated BUN, elevated creatinine and decreased GFR with an elevated anion gap at 17.  Hepatic function panel shows increased protein and indirect bilirubin.  CT renal stone study shows a partial small bowel obstruction with transition zone within the mid left abdomen.  No ureterolithiasis.  Given findings on lab work patient appears quite dehydrated.  Will give him a fluid bolus, pain and nausea medication.  Will also place an NG tube.  Consulted surgery who recommended the same.  Will plan to admit to the hospitalist.  Clinical Course as of 10/17/23 1750  Thu Oct 17, 2023  1739 Spoke with hospitalist who is agreeable to admit. [LD]    Clinical Course User Index [LD] Cleaster Tinnie LABOR, PA-C     FINAL CLINICAL IMPRESSION(S) / ED DIAGNOSES   Final diagnoses:  Partial small bowel obstruction (HCC)     Rx / DC Orders   ED Discharge Orders     None        Note:  This document was prepared using Dragon voice recognition software and may include unintentional dictation errors.   Cleaster Tinnie LABOR, PA-C 10/17/23 1750    Levander Slate, MD 10/17/23 [phone]

## 2023-10-17 NOTE — H&P (Signed)
 History and Physical    Patient: [name] FMW:[id number] DOB: 10/31/1950 DOA: 10/17/2023 DOS: the patient was seen and examined on 10/17/2023 PCP: Leavy Mole, PA-C  Patient coming from: Home  Chief Complaint:  Chief Complaint  Patient presents with   Flank Pain   HPI: Christopher FELMLEE is a 73 y.o. male with medical history significant for smoldering multiple myeloma, arthritis, colon cancer with history of colon resection, depression, GERD,  and nephrolithiasis presented with a complaint of intractable nausea and vomiting. He has had symptoms like this with renalithiasis in the past.   Renal stone CT study was performed in the ED and demonstrated Partial Small Bowel Obstruction with a transition zone in the left mid-abdomen. He was also found to have WBC of 14.5, creatinine of 1.57 elevated over his baseline of 1.10, anion gap of 17, and low chloride.   The patient denies fevers, chills, cough, chest pain, shortness of breath, cough, diarrhea, constipation, rashes, sores, or lesions. No neurological changes. No headache, confusion, or seizures. Review of Systems: As mentioned in the history of present illness. All other systems reviewed and are negative. Past Medical History:  Diagnosis Date   Abnormal laboratory test    Sees Dr. Marina   Acid reflux    Allergy bee venom   mainly as a child   Anxiety 12/08/1969   Arthritis    Asthma 12/08/1953   mainly as a child   Chronic back pain    Colon cancer (HCC) 11/1999   stage 3 s/p colon resection   Depression    H/O Clostridium difficile infection    History of chemotherapy    5-FU pump/leukovorin   History of kidney stones    Hypertension 04/05/2022   Hypothyroidism    NO MEDS NOW   Insomnia    Multiple myeloma (HCC) 12/2011   smoldering vs mgus   Nephrolithiasis 08/28/2017   Osteoporosis    Seasonal allergies    Past Surgical History:  Procedure Laterality Date   BACK SURGERY     CATARACT EXTRACTION W/PHACO Right  10/02/2023   Procedure: PHACOEMULSIFICATION, CATARACT, WITH IOL INSERTION 2.53 00:27.3;  Surgeon: Mittie Gaskin, MD;  Location: [facility];  Service: Ophthalmology;  Laterality: Right;   CATARACT EXTRACTION W/PHACO Left 10/16/2023   Procedure: PHACOEMULSIFICATION, CATARACT, WITH IOL INSERTION 4.75 00:32.3;  Surgeon: Mittie Gaskin, MD;  Location: [facility];  Service: Ophthalmology;  Laterality: Left;   COLON SURGERY  2001   resection, 2nd surgery for scar tissue removal   COLONOSCOPY     COLONOSCOPY WITH PROPOFOL  N/A 03/26/2016   Procedure: COLONOSCOPY WITH PROPOFOL ;  Surgeon: Gladis RAYMOND Mariner, MD;  Location: [facility];  Service: Endoscopy;  Laterality: N/A;   COLONOSCOPY WITH PROPOFOL  N/A 06/16/2021   Procedure: COLONOSCOPY WITH PROPOFOL ;  Surgeon: Therisa Bi, MD;  Location: [facility];  Service: Gastroenterology;  Laterality: N/A;   CYSTOSCOPY WITH URETEROSCOPY AND STENT PLACEMENT Left 02/21/2018   Procedure: URETEROSCOPY, LASER LITHOTRIPSY, STONE REMOVAL AND STENT PLACEMENT;  Surgeon: Twylla Glendia BROCKS, MD;  Location: [facility];  Service: Urology;  Laterality: Left;   CYSTOSCOPY/URETEROSCOPY/HOLMIUM LASER/STENT PLACEMENT Right 09/03/2017   Procedure: CYSTOSCOPY/URETEROSCOPY/HOLMIUM LASER/STENT PLACEMENT;  Surgeon: Twylla Glendia BROCKS, MD;  Location: [facility];  Service: Urology;  Laterality: Right;   ESOPHAGOGASTRODUODENOSCOPY  10/2010   EYE SURGERY  1994 and 2010   RKA in 94 and [place] in 2010   FLEXIBLE SIGMOIDOSCOPY N/A 07/06/2018   Procedure: FLEXIBLE SIGMOIDOSCOPY;  Surgeon: Aundria, Teodoro K, MD;  Location:  [facility];  Service: Gastroenterology;  Laterality: N/A;   HERNIA REPAIR  1970   lower left   HOLMIUM LASER APPLICATION Left 02/21/2018   Procedure: HOLMIUM LASER APPLICATION;  Surgeon: Twylla Glendia BROCKS, MD;  Location: [facility];  Service: Urology;  Laterality: Left;   LUMBAR FUSION     L4-L5, L5-S1   SACROILIAC JOINT FUSION     SHOULDER SURGERY  Left    SPINAL CORD STIMULATOR IMPLANT     Social History:  reports that he quit smoking about 53 years ago. His smoking use included cigarettes. He started smoking about 63 years ago. He has a 10 pack-year smoking history. He has never used smokeless tobacco. He reports that he does not drink alcohol and does not use drugs.  Allergies  Allergen Reactions   Bee Venom Swelling    Family History  Problem Relation Age of Onset   Lymphoma Mother    Arthritis Mother        deceased   Cancer Mother    Lung cancer Father    Alcohol abuse Father        deceased   Cancer Father    Heart disease Other        grandparents   Hyperlipidemia Sister    Diabetes Brother    Diabetes Son    Alcohol abuse Brother    Arthritis Brother    Depression Brother    Diabetes Brother    Alcohol abuse Sister    Arthritis Sister    Depression Sister    Alcohol abuse Son    Early death Son        truck accident 46 at age 78    Prior to Admission medications   Medication Sig Start Date End Date Taking? Authorizing Provider  acetaminophen  (TYLENOL ) 650 MG CR tablet Take 1,300 mg by mouth every 8 (eight) hours as needed for pain.    [provider]  cholecalciferol  (VITAMIN D3) 25 MCG (1000 UT) tablet Take 2,000 Units by mouth daily.     [provider]  Multiple Vitamins-Minerals (CENTRUM SILVER ULTRA MENS) TABS Take 1 tablet by mouth daily.    [provider]  tiZANidine  (ZANAFLEX ) 4 MG tablet Take 0.5-1.5 tablets (2-6 mg total) by mouth every 8 (eight) hours as needed for muscle spasms (muscle tightness). 04/05/22   Tapia, Leisa, PA-C  Vibegron  (GEMTESA ) 75 MG TABS Take 1 tablet (75 mg total) by mouth daily. 09/11/23   Francisca Redell BROCKS, MD    Physical Exam: Vitals:   10/17/23 1856 10/17/23 1907 10/17/23 1917 10/17/23 1944  BP: (!) 148/96  (!) 148/96 (!) 156/94  Pulse:  82 73 82  Resp:   16 19  Temp: 98.2 F (36.8 C)  98.3 F (36.8 C) 98.5 F (36.9 C)  TempSrc: Oral   Oral   SpO2:  100% 98% 98%  Weight:      Height:       Exam:  Constitutional:  The patient is awake, alert, and oriented x 3. No acute distress. Eyes:  pupils and irises appear normal Normal lids and conjunctivae ENMT:  grossly normal hearing  Lips appear normal external ears, nose appear normal Oropharynx: mucosa, tongue,posterior pharynx appear normal Neck:  neck appears normal, no masses, normal ROM, supple no thyromegaly Respiratory:  No increased work of breathing. No wheezes, rales, or rhonchi No tactile fremitus Cardiovascular:  Regular rate and rhythm No murmurs, ectopy, or gallups. No lateral PMI. No thrills. Abdomen:  Abdomen is soft,  diffusely tender, and slightly distended. No hernias, masses, or organomegaly Hypoactive bowel sounds.  Musculoskeletal:  No cyanosis, clubbing, or edema Skin:  No rashes, lesions, ulcers palpation of skin: no induration or nodules Neurologic:  CN 2-12 intact Sensation all 4 extremities intact Psychiatric:  Mental status Mood, affect appropriate Orientation to person, place, time  judgment and insight appear intact  Data Reviewed:  Renal Stone CT CBC BMP  Assessment and Plan: Partial small bowel obstruction (HCC) Renal Stone CT demonstrated a partial small bowel obstruction with a transition zone within the mid left abdomen. General surgery was contacted from the ED. They recommended NPO status and NGT placement. They did not feel that surgical intervention would be necessary. Multiple attempts were made to place NGT in ED without success. Will probably need to have this placed by radiology in the am if he continues to vomit. He will receive IV D5 in LR overnight. Advance diet as tolerated.  Benign prostatic hyperplasia On the Renal stone CT study the patient's prostate was found to have moderate to marked severity enlargement of the prostate gland. PSA has been ordered.  History of colon cancer Noted. History of  colon resection.  Smoldering multiple myeloma (SMM) Noted. Some level of immunosuppression likely.  Acute renal failure superimposed on stage 3a chronic kidney disease (HCC) Creatinine is elevated at 1.57 over the patient's baseline of 1.1. This is likely due to the patient's volume losses due to intractable nausea and vomiting.      Advance Care Planning:   Code Status: Full Code   Consults: None  Family Communication: None available  Severity of Illness: The appropriate patient status for this patient is INPATIENT. Inpatient status is judged to be reasonable and necessary in order to provide the required intensity of service to ensure the patient's safety. The patient's presenting symptoms, physical exam findings, and initial radiographic and laboratory data in the context of their chronic comorbidities is felt to place them at high risk for further clinical deterioration. Furthermore, it is not anticipated that the patient will be medically stable for discharge from the hospital within 2 midnights of admission.   * I certify that at the point of admission it is my clinical judgment that the patient will require inpatient hospital care spanning beyond 2 midnights from the point of admission due to high intensity of service, high risk for further deterioration and high frequency of surveillance required.*  Author: Mendi Constable, DO 10/17/2023 9:23 PM  For on call review [email or web address].

## 2023-10-17 NOTE — Assessment & Plan Note (Signed)
 On the Renal stone CT study the patient's prostate was found to have moderate to marked severity enlargement of the prostate gland. PSA has been ordered.

## 2023-10-17 NOTE — Assessment & Plan Note (Signed)
 Noted. Some level of immunosuppression likely.

## 2023-10-17 NOTE — Assessment & Plan Note (Signed)
 Noted. History of colon resection.

## 2023-10-17 NOTE — ED Notes (Signed)
 Paramedic [place] performed four attempts to place the NG tube without success.

## 2023-10-17 NOTE — Assessment & Plan Note (Addendum)
 Renal Stone CT demonstrated a partial small bowel obstruction with a transition zone within the mid left abdomen. General surgery was contacted from the ED. They recommended NPO status and NGT placement. They did not feel that surgical intervention would be necessary. Multiple attempts were made to place NGT in ED without success. Will probably need to have this placed by radiology in the am if he continues to vomit. He will receive IV D5 in LR overnight. Advance diet as tolerated.

## 2023-10-17 NOTE — ED Notes (Signed)
Shift report received, assumed care of patient at this time 

## 2023-10-17 NOTE — ED Triage Notes (Signed)
 Patient to ED via POV for right sided flank pain. Started last night with vomiting. Hx of kidney stones.

## 2023-10-17 NOTE — Progress Notes (Signed)
 ED consult for this patient coming in with abdominal pain, associated with persistent nausea and vomiting.  CT scan showed small bowel obstruction with transition point.  Personally viewed the images and no evidence of torsion/volvulus or closed loop obstruction that would require urgent surgical intervention.  Due to nausea/vomiting, his Cr is elevated and labs show some dehydration and hemoconcentration.  ED RN team unable to place NG tube at bedside despite of multiple attempts.  For now keep strict NPO, hydrate, treat n/v as needed.  Will obtain KUB tomorrow AM to evaluate his SBO progress.  Full consult note to follow in AM.  Aloysius Plant, MD

## 2023-10-17 NOTE — Assessment & Plan Note (Signed)
 Creatinine is elevated at 1.57 over the patient's baseline of 1.1. This is likely due to the patient's volume losses due to intractable nausea and vomiting.

## 2023-10-18 ENCOUNTER — Inpatient Hospital Stay

## 2023-10-18 DIAGNOSIS — K56609 Unspecified intestinal obstruction, unspecified as to partial versus complete obstruction: Secondary | ICD-10-CM

## 2023-10-18 DIAGNOSIS — K56600 Partial intestinal obstruction, unspecified as to cause: Secondary | ICD-10-CM | POA: Diagnosis not present

## 2023-10-18 LAB — BASIC METABOLIC PANEL WITH GFR
Anion gap: 8 (ref 5–15)
BUN: 34 mg/dL — ABNORMAL HIGH (ref 8–23)
CO2: 32 mmol/L (ref 22–32)
Calcium: 9 mg/dL (ref 8.9–10.3)
Chloride: 99 mmol/L (ref 98–111)
Creatinine, Ser: 1.48 mg/dL — ABNORMAL HIGH (ref 0.61–1.24)
GFR, Estimated: 49 mL/min — ABNORMAL LOW (ref >60)
Glucose, Bld: 129 mg/dL — ABNORMAL HIGH (ref 70–99)
Potassium: 3.7 mmol/L (ref 3.5–5.1)
Sodium: 139 mmol/L (ref 135–145)

## 2023-10-18 LAB — CBC
HCT: 39.7 % (ref 39.0–52.0)
Hemoglobin: 13.5 g/dL (ref 13.0–17.0)
MCH: 32 pg (ref 26.0–34.0)
MCHC: 34 g/dL (ref 30.0–36.0)
MCV: 94.1 fL (ref 80.0–100.0)
Platelets: 388 K/uL (ref 150–400)
RBC: 4.22 MIL/uL (ref 4.22–5.81)
RDW: 12.2 % (ref 11.5–15.5)
WBC: 10.5 K/uL (ref 4.0–10.5)
nRBC: 0 % (ref 0.0–0.2)

## 2023-10-18 MED ORDER — PHENOL 1.4 % MT LIQD
1.0000 | OROMUCOSAL | Status: DC | PRN
  Filled 2023-10-18: qty 177

## 2023-10-18 NOTE — TOC CM/SW Note (Signed)
 Transition of Care [facility]) - Inpatient Brief Assessment   Patient Details  Name: [name] MRN: [id number] Date of Birth: 01/10/1951  [facility] CM/SW Contact:    Christopher Rummer, LCSW Phone Number: 10/18/2023, 11:16 AM   Clinical Narrative: [name] completed chart review. Pt is awaiting surgery. Pt pcp is Dr. Leavy. No TOC needs identified.   Transition of Care Asessment: Insurance and Status: Insurance coverage has been reviewed ([employer])   Home environment has been reviewed: Single family home   Prior/Current Home Services: No current home services Social Drivers of Health Review: SDOH reviewed no interventions necessary   Transition of care needs: no transition of care needs at this time

## 2023-10-18 NOTE — Consult Note (Signed)
 [facility] SURGICAL CONSULTATION NOTE (initial) - cpt: 00756   HISTORY OF PRESENT ILLNESS (HPI):  73 y.o. male presented to [facility] ED yesterday for evaluation of abdominal pain. Patient reports right sided abdominal vs flank pain over the course of the previous 24 hours. This has been accompanied with numerous episodes of emesis and nausea. No fever, chills, CP, SOB, urinary changes. He did have BM prior to presenting to the ED.  Previous abdominal surgeries positive for colon surgery and hernia repair. Work up in the ED revealed WBC to 14.5K (now 10.5K), Hgb to 16.9, sCr - 1.57. He underwent CT Abdomen/Pelvis which was concerning for SBO. He was admitted to the medicine service. NGT attempts in the ED were unsuccessful.   Surgery is consulted by emergency medicine provider Tinnie Hudson, PA-C in this context for evaluation and management of SBO.  PAST MEDICAL HISTORY (PMH):  Past Medical History:  Diagnosis Date   Abnormal laboratory test    Sees Dr. Marina   Acid reflux    Allergy bee venom   mainly as a child   Anxiety 12/08/1969   Arthritis    Asthma 12/08/1953   mainly as a child   Chronic back pain    Colon cancer (HCC) 11/1999   stage 3 s/p colon resection   Depression    H/O Clostridium difficile infection    History of chemotherapy    5-FU pump/leukovorin   History of kidney stones    Hypertension 04/05/2022   Hypothyroidism    NO MEDS NOW   Insomnia    Multiple myeloma (HCC) 12/2011   smoldering vs mgus   Nephrolithiasis 08/28/2017   Osteoporosis    Seasonal allergies      PAST SURGICAL HISTORY (PSH):  Past Surgical History:  Procedure Laterality Date   BACK SURGERY     CATARACT EXTRACTION W/PHACO Right 10/02/2023   Procedure: PHACOEMULSIFICATION, CATARACT, WITH IOL INSERTION 2.53 00:27.3;  Surgeon: Mittie Gaskin, MD;  Location: [facility];  Service: Ophthalmology;  Laterality: Right;   CATARACT EXTRACTION W/PHACO Left 10/16/2023    Procedure: PHACOEMULSIFICATION, CATARACT, WITH IOL INSERTION 4.75 00:32.3;  Surgeon: Mittie Gaskin, MD;  Location: [facility];  Service: Ophthalmology;  Laterality: Left;   COLON SURGERY  2001   resection, 2nd surgery for scar tissue removal   COLONOSCOPY     COLONOSCOPY WITH PROPOFOL  N/A 03/26/2016   Procedure: COLONOSCOPY WITH PROPOFOL ;  Surgeon: Gladis RAYMOND Mariner, MD;  Location: [facility] ENDOSCOPY;  Service: Endoscopy;  Laterality: N/A;   COLONOSCOPY WITH PROPOFOL  N/A 06/16/2021   Procedure: COLONOSCOPY WITH PROPOFOL ;  Surgeon: Therisa Bi, MD;  Location: [facility] ENDOSCOPY;  Service: Gastroenterology;  Laterality: N/A;   CYSTOSCOPY WITH URETEROSCOPY AND STENT PLACEMENT Left 02/21/2018   Procedure: URETEROSCOPY, LASER LITHOTRIPSY, STONE REMOVAL AND STENT PLACEMENT;  Surgeon: Twylla Glendia BROCKS, MD;  Location: [facility] ORS;  Service: Urology;  Laterality: Left;   CYSTOSCOPY/URETEROSCOPY/HOLMIUM LASER/STENT PLACEMENT Right 09/03/2017   Procedure: CYSTOSCOPY/URETEROSCOPY/HOLMIUM LASER/STENT PLACEMENT;  Surgeon: Twylla Glendia BROCKS, MD;  Location: [facility] ORS;  Service: Urology;  Laterality: Right;   ESOPHAGOGASTRODUODENOSCOPY  10/2010   EYE SURGERY  1994 and 2010   RKA in 94 and [place] in 2010   FLEXIBLE SIGMOIDOSCOPY N/A 07/06/2018   Procedure: FLEXIBLE SIGMOIDOSCOPY;  Surgeon: Toledo, Ladell POUR, MD;  Location: [facility] ENDOSCOPY;  Service: Gastroenterology;  Laterality: N/A;   HERNIA REPAIR  1970   lower left   HOLMIUM LASER APPLICATION Left 02/21/2018   Procedure: HOLMIUM LASER APPLICATION;  Surgeon: Twylla Glendia  C, MD;  Location: [facility] ORS;  Service: Urology;  Laterality: Left;   LUMBAR FUSION     L4-L5, L5-S1   SACROILIAC JOINT FUSION     SHOULDER SURGERY Left    SPINAL CORD STIMULATOR IMPLANT       MEDICATIONS:  Prior to Admission medications   Medication Sig Start Date End Date Taking? Authorizing Provider  acetaminophen  (TYLENOL ) 650 MG CR tablet Take 1,300 mg by mouth every 8 (eight)  hours as needed for pain.    [provider]  cholecalciferol  (VITAMIN D3) 25 MCG (1000 UT) tablet Take 2,000 Units by mouth daily.     [provider]  Multiple Vitamins-Minerals (CENTRUM SILVER ULTRA MENS) TABS Take 1 tablet by mouth daily.    [provider]  tiZANidine  (ZANAFLEX ) 4 MG tablet Take 0.5-1.5 tablets (2-6 mg total) by mouth every 8 (eight) hours as needed for muscle spasms (muscle tightness). 04/05/22   Tapia, Leisa, PA-C  Vibegron  (GEMTESA ) 75 MG TABS Take 1 tablet (75 mg total) by mouth daily. 09/11/23   Francisca Redell BROCKS, MD     ALLERGIES:  Allergies  Allergen Reactions   Bee Venom Swelling     SOCIAL HISTORY:  Social History   Socioeconomic History   Marital status: Married    Spouse name: Not on file   Number of children: 2   Years of education: Not on file   Highest education level: 12th grade  Occupational History   Occupation: Retired  Tobacco Use   Smoking status: Former    Current packs/day: 0.00    Average packs/day: 1 pack/day for 10.0 years (10.0 ttl pk-yrs)    Types: Cigarettes    Start date: 03/06/1960    Quit date: 03/06/1970    Years since quitting: 53.6   Smokeless tobacco: Never   Tobacco comments:    started at age 25 and quit at age 60  Vaping Use   Vaping status: Never Used  Substance and Sexual Activity   Alcohol use: No   Drug use: No   Sexual activity: Yes    Birth control/protection: None  Other Topics Concern   Not on file  Social History Narrative   Pt is an avid golfer and bowls every week   Social Drivers of Corporate investment banker Strain: Low Risk  (08/15/2023)   Overall Financial Resource Strain (CARDIA)    Difficulty of Paying Living Expenses: Not hard at all  Food Insecurity: No Food Insecurity (10/17/2023)   Hunger Vital Sign    Worried About Running Out of Food in the Last Year: Never true    Ran Out of Food in the Last Year: Never true  Transportation Needs: No Transportation Needs  (10/17/2023)   PRAPARE - Administrator, Civil Service (Medical): No    Lack of Transportation (Non-Medical): No  Physical Activity: Insufficiently Active (08/15/2023)   Exercise Vital Sign    Days of Exercise per Week: 1 day    Minutes of Exercise per Session: 60 min  Stress: Stress Concern Present (08/15/2023)   [employer] of Occupational Health - Occupational Stress Questionnaire    Feeling of Stress: To some extent  Social Connections: Socially Integrated (10/17/2023)   Social Connection and Isolation Panel    Frequency of Communication with Friends and Family: Three times a week    Frequency of Social Gatherings with Friends and Family: Three times a week    Attends Religious Services: More than 4 times per year  Active Member of Clubs or Organizations: Yes    Attends Banker Meetings: More than 4 times per year    Marital Status: Married  Catering manager Violence: Not At Risk (10/17/2023)   Humiliation, Afraid, Rape, and Kick questionnaire    Fear of Current or Ex-Partner: No    Emotionally Abused: No    Physically Abused: No    Sexually Abused: No     FAMILY HISTORY:  Family History  Problem Relation Age of Onset   Lymphoma Mother    Arthritis Mother        deceased   Cancer Mother    Lung cancer Father    Alcohol abuse Father        deceased   Cancer Father    Heart disease Other        grandparents   Hyperlipidemia Sister    Diabetes Brother    Diabetes Son    Alcohol abuse Brother    Arthritis Brother    Depression Brother    Diabetes Brother    Alcohol abuse Sister    Arthritis Sister    Depression Sister    Alcohol abuse Son    Early death Son        truck accident 1 at age 2      REVIEW OF SYSTEMS:  Review of Systems  Constitutional:  Negative for chills and fever.  Respiratory:  Negative for cough and shortness of breath.   Cardiovascular:  Negative for chest pain and palpitations.  Gastrointestinal:  Positive  for abdominal pain, nausea and vomiting. Negative for blood in stool, constipation and diarrhea.  Genitourinary:  Negative for dysuria and urgency.  All other systems reviewed and are negative.   VITAL SIGNS:  Temp:  [98.1 F (36.7 C)-98.8 F (37.1 C)] 98.1 F (36.7 C) (09/12 0348) Pulse Rate:  [59-95] 59 (09/12 0348) Resp:  [16-19] 19 (09/11 1944) BP: (129-156)/(68-102) 129/68 (09/12 0348) SpO2:  [97 %-100 %] 97 % (09/12 0348) Weight:  [59 kg] 59 kg (09/11 1505)     Height: 5' 7 (170.2 cm) Weight: 59 kg BMI (Calculated): 20.36   INTAKE/OUTPUT:  09/11 0701 - 09/12 0700 In: 958.5 [I.V.:958.5] Out: -   PHYSICAL EXAM:  Physical Exam Vitals and nursing note reviewed. Exam conducted with a chaperone present.  Constitutional:      General: He is not in acute distress.    Appearance: Normal appearance. He is normal weight. He is not ill-appearing.     Comments: Sitting up in bed; NAD  HENT:     Head: Normocephalic and atraumatic.     Nose:     Comments: NGT place to right nare Eyes:     General: No scleral icterus.    Conjunctiva/sclera: Conjunctivae normal.  Cardiovascular:     Rate and Rhythm: Normal rate.     Pulses: Normal pulses.  Pulmonary:     Effort: Pulmonary effort is normal. No respiratory distress.  Abdominal:     General: Abdomen is flat. A surgical scar is present. There is no distension.     Palpations: Abdomen is soft.     Tenderness: There is no abdominal tenderness. There is no guarding or rebound.  Genitourinary:    Comments: Deferred Skin:    General: Skin is warm and dry.     Findings: No erythema.  Neurological:     General: No focal deficit present.     Mental Status: He is alert and oriented to person, place, and  time.  Psychiatric:        Mood and Affect: Mood normal.        Behavior: Behavior normal.      Labs:     Latest Ref Rng & Units 10/18/2023    4:13 AM 10/17/2023    3:08 PM 08/15/2023   11:10 AM  CBC  WBC 4.0 - 10.5 K/uL 10.5   14.5  8.2   Hemoglobin 13.0 - 17.0 g/dL 86.4  83.0  84.8   Hematocrit 39.0 - 52.0 % 39.7  48.5  46.5   Platelets 150 - 400 K/uL 388  495  415       Latest Ref Rng & Units 10/18/2023    4:13 AM 10/17/2023    3:08 PM 08/15/2023   11:10 AM  CMP  Glucose 70 - 99 mg/dL 870  801  91   BUN 8 - 23 mg/dL 34  33  18   Creatinine 0.61 - 1.24 mg/dL 8.51  8.42  8.81   Sodium 135 - 145 mmol/L 139  138  137   Potassium 3.5 - 5.1 mmol/L 3.7  4.0  4.3   Chloride 98 - 111 mmol/L 99  93  100   CO2 22 - 32 mmol/L 32  28  27   Calcium  8.9 - 10.3 mg/dL 9.0  89.2  9.9   Total Protein 6.5 - 8.1 g/dL  8.7  7.2   Total Bilirubin 0.0 - 1.2 mg/dL  1.2  0.5   Alkaline Phos 38 - 126 U/L  61    AST 15 - 41 U/L  30  19   ALT 0 - 44 U/L  21  14     Imaging studies:   CT Abdomen/Pelvis (10/17/2023) personally reviewed with dilated loops of small bowel, transition in right abdomen, no free air, no evidence of volvulus, and radiologist report reviewed below:  IMPRESSION: 1. Findings consistent with a partial small bowel obstruction with a transition zone noted within the mid left abdomen. 2. Nonobstructing left renal calculi. 3. Moderate to marked severity prostate gland enlargement. Correlation with PSA levels is recommended. 4. 4 mm right lower lobe pulmonary nodule. Follow-up with nonemergent dedicated chest CT is recommended. 5. Aortic atherosclerosis.   Assessment/Plan: (ICD-10's: K56.609) 73 y.o. male with small bowel obstruction.   - Appreciate medicine admission - NGT place at bedside this morning; Continue LIS; monitor and record output - Serial KUB; ordered for morning - No need for emergent surgical intervention. He, and his wife, understand that if he fails to resolve or deteriorates, we would need to consider this - Monitor abdominal examination; on-going bowel function   - Pain control prn; antiemetics prn   - Mobilize; can clamp NGT to allow this    - Further management per primary service;  we will follow along   All of the above findings and recommendations were discussed with the patient and his family, and all of their questions were answered to their expressed satisfaction.  Thank you for the opportunity to participate in this patient's care.   -- Arthea Platt, PA-C [facility] 10/18/2023, 7:15 AM M-F: 7am - 4pm

## 2023-10-18 NOTE — Progress Notes (Signed)
 Progress Note    [name]  FMW:[id number] DOB: 12-Apr-1950  DOA: 10/17/2023 PCP: Leavy Mole, PA-C      Brief Narrative:    Medical records reviewed and are as summarized below:  [name] is a 73 y.o. male with medical history significant for smoldering multiple myeloma, arthritis, colon cancer with history of colon resection, depression, GERD, nephrolithiasis, who presented to the hospital with nausea, vomiting and abdominal pain. He was found to have small bowel obstruction.     Assessment/Plan:   Principal Problem:   Partial small bowel obstruction (HCC) Active Problems:   Smoldering multiple myeloma (SMM)   History of colon cancer   Benign prostatic hyperplasia   Acute renal failure superimposed on stage 3a chronic kidney disease (HCC)    Body mass index is 20.36 kg/m.   Small bowel obstruction the patient with history of colon resection for colon cancer: NG tube has been placed for history of decompression.  He is NPO.  Continue IV fluids for hydration.  Analgesics as needed for pain.  Antiemetics as needed for nausea/vomiting.  Follow-up with general surgeon for further recommendations.   AKI on CKD stage IIIa: Creatinine is slowly trending down, 1.57-1.48.  Continue IV fluids and monitor BMP.   BPH: Continue mirabegron .  PSA had been ordered on admission and this is pending.   Smoldering multiple myeloma: No acute issues  Diet Order             Diet NPO time specified Except for: Ice Chips  Diet effective now                                  Consultants: General Surgeon  Procedures: None    Medications:    cholecalciferol   2,000 Units Oral Daily   enoxaparin  (LOVENOX ) injection  40 mg Subcutaneous Q24H   mirabegron  ER  25 mg Oral Daily   Continuous Infusions:  dextrose  5% lactated ringers  125 mL/hr at 10/18/23 0359     Anti-infectives (From admission, onward)    None              Family  Communication/Anticipated D/C date and plan/Code Status   DVT prophylaxis: enoxaparin  (LOVENOX ) injection 40 mg Start: 10/17/23 1800     Code Status: Full Code  Family Communication: None Disposition Plan: Plan to discharge home   Status is: Inpatient Remains inpatient appropriate because: Small bowel obstruction       Subjective:   Interval events noted.  He feels a little better.  Abdominal pain is better.  No nausea or vomiting.  NG tube was successfully placed by surgeon this morning.  Objective:    Vitals:   10/17/23 1907 10/17/23 1917 10/17/23 1944 10/18/23 0348  BP:  (!) 148/96 (!) 156/94 129/68  Pulse: 82 73 82 (!) 59  Resp:  16 19   Temp:  98.3 F (36.8 C) 98.5 F (36.9 C) 98.1 F (36.7 C)  TempSrc:  Oral Oral Oral  SpO2: 100% 98% 98% 97%  Weight:      Height:       No data found.   Intake/Output Summary (Last 24 hours) at 10/18/2023 1305 Last data filed at 10/18/2023 0359 Gross per 24 hour  Intake 958.47 ml  Output --  Net 958.47 ml   Filed Weights   10/17/23 1505  Weight: 59 kg    Exam:  GEN: NAD SKIN: Warm  and dry EYES: No pallor or icterus ENT: MMM, NG tube in place CV: RRR PULM: CTA B ABD: soft, ND, NT, +BS CNS: AAO x 3, non focal EXT: No edema or tenderness        Data Reviewed:   I have personally reviewed following labs and imaging studies:  Labs: Labs show the following:   Basic Metabolic Panel: Recent Labs  Lab 10/17/23 1508 10/18/23 0413  NA 138 139  K 4.0 3.7  CL 93* 99  CO2 28 32  GLUCOSE 198* 129*  BUN 33* 34*  CREATININE 1.57* 1.48*  CALCIUM  10.7* 9.0  MG 2.4  --    GFR Estimated Creatinine Clearance: 36.5 mL/min (A) (by C-G formula based on SCr of 1.48 mg/dL (H)). Liver Function Tests: Recent Labs  Lab 10/17/23 1508  AST 30  ALT 21  ALKPHOS 61  BILITOT 1.2  PROT 8.7*  ALBUMIN 4.9   Recent Labs  Lab 10/17/23 1508  LIPASE 38   No results for input(s): AMMONIA in the last 168  hours. Coagulation profile No results for input(s): INR, PROTIME in the last 168 hours.  CBC: Recent Labs  Lab 10/17/23 1508 10/18/23 0413  WBC 14.5* 10.5  HGB 16.9 13.5  HCT 48.5 39.7  MCV 92.6 94.1  PLT 495* 388   Cardiac Enzymes: No results for input(s): CKTOTAL, CKMB, CKMBINDEX, TROPONINI in the last 168 hours. BNP (last 3 results) No results for input(s): PROBNP in the last 8760 hours. CBG: No results for input(s): GLUCAP in the last 168 hours. D-Dimer: No results for input(s): DDIMER in the last 72 hours. Hgb A1c: No results for input(s): HGBA1C in the last 72 hours. Lipid Profile: No results for input(s): CHOL, HDL, LDLCALC, TRIG, CHOLHDL, LDLDIRECT in the last 72 hours. Thyroid  function studies: No results for input(s): TSH, T4TOTAL, T3FREE, THYROIDAB in the last 72 hours.  Invalid input(s): FREET3 Anemia work up: No results for input(s): VITAMINB12, FOLATE, FERRITIN, TIBC, IRON, RETICCTPCT in the last 72 hours. Sepsis Labs: Recent Labs  Lab 10/17/23 1508 10/18/23 0413  WBC 14.5* 10.5    Microbiology No results found for this or any previous visit (from the past 240 hours).  Procedures and diagnostic studies:  DG Abd Portable 1V Result Date: 10/18/2023 EXAM: 1 VIEW XRAY OF THE ABDOMEN 10/18/2023 08:42:00 AM COMPARISON: None available. CLINICAL HISTORY: Encounter for nasogastric (NG) tube placement [id number]. NG placement. FINDINGS: LINES, TUBES AND DEVICES: Enteric tube in place with distal tip and side port terminating within the expected location of the proximal stomach. Left upper quadrant surgical clips noted. Thoracic spine stimulator leads noted. BOWEL: Dilated small bowel loops within the upper abdomen measuring up to 3.5 cm. This is compatible with a mid to distal small bowel obstruction. There is no free air. SOFT TISSUES: No opaque urinary calculi. BONES: Bilateral sacroiliac arthrodesis hardware  noted. No acute osseous abnormality. IMPRESSION: 1. Mid to distal small bowel obstruction. No free air. 2. Enteric tube in place with distal tip and side port terminating within the expected location of the proximal stomach. Electronically signed by: Dorethia Molt MD 10/18/2023 11:31 AM EDT RP Workstation: [id number]   DG Abd 2 Views Result Date: 10/18/2023 CLINICAL DATA:  Small-bowel obstruction EXAM: ABDOMEN - 2 VIEW COMPARISON:  The CT abdomen pelvis dated 10/17/2023, abdominal radiograph dated 07/06/2018 FINDINGS: Gas-filled dilation of multiple loops of right hemi abdominal small bowel, in keeping with finding on prior CT. No free air. Postsurgical changes of bilateral sacroiliac joints. Intrathecal device  projects over the left hemipelvis with leads terminating over the partially imaged thoracic spine. Surgical clips project over the lateral left upper quadrant. Additional surgical sutures and lobulated, calcified radiodensity in the left hemiabdomen, better evaluated on prior CT. IMPRESSION: Gas-filled dilation of multiple loops of right hemi abdominal small bowel, in keeping with small-bowel obstruction on prior CT. Electronically Signed   By: Limin  Xu M.D.   On: 10/18/2023 10:27   CT Renal Stone Study Result Date: 10/17/2023 CLINICAL DATA:  Right flank pain. EXAM: CT ABDOMEN AND PELVIS WITHOUT CONTRAST TECHNIQUE: Multidetector CT imaging of the abdomen and pelvis was performed following the standard protocol without IV contrast. RADIATION DOSE REDUCTION: This exam was performed according to the departmental dose-optimization program which includes automated exposure control, adjustment of the mA and/or kV according to patient size and/or use of iterative reconstruction technique. COMPARISON:  None Available. FINDINGS: Lower chest: A 4 mm pulmonary nodule seen within the posterolateral aspect of the right lung base. Hepatobiliary: No focal liver abnormality is seen. No gallstones, gallbladder wall  thickening, or biliary dilatation. Pancreas: Unremarkable. No pancreatic ductal dilatation or surrounding inflammatory changes. Spleen: Normal in size without focal abnormality. Adrenals/Urinary Tract: Adrenal glands are unremarkable. Kidneys are normal in size, without obstructing renal calculi, focal lesion, or hydronephrosis. 3 mm and 4 mm nonobstructing renal calculi seen within the left kidney. Bladder is unremarkable. Stomach/Bowel: Stomach is within normal limits. Appendix appears normal. The duodenum and proximal jejunum are markedly dilated (approximately 4.1 cm in diameter). A transition zone is noted within the mid left abdomen (axial CT images 34 through 44, CT series 2). Vascular/Lymphatic: Aortic atherosclerosis. No enlarged abdominal or pelvic lymph nodes. Reproductive: Moderate to marked severity prostate gland enlargement is seen with moderate severity left-sided parenchymal calcification. Other: No abdominal wall hernia or abnormality. No abdominopelvic ascites. Musculoskeletal: Postoperative changes are seen within the lower lumbar spine and sacrum. IMPRESSION: 1. Findings consistent with a partial small bowel obstruction with a transition zone noted within the mid left abdomen. 2. Nonobstructing left renal calculi. 3. Moderate to marked severity prostate gland enlargement. Correlation with PSA levels is recommended. 4. 4 mm right lower lobe pulmonary nodule. Follow-up with nonemergent dedicated chest CT is recommended. 5. Aortic atherosclerosis. Electronically Signed   By: Suzen Dials M.D.   On: 10/17/2023 16:29               LOS: 1 day   Auguste Tebbetts  [facility]   Pager on [email or web address]. If 7PM-7AM, please contact night-coverage at [email or web address]     10/18/2023, 1:05 PM

## 2023-10-18 NOTE — Plan of Care (Signed)

## 2023-10-19 ENCOUNTER — Inpatient Hospital Stay

## 2023-10-19 DIAGNOSIS — K56600 Partial intestinal obstruction, unspecified as to cause: Secondary | ICD-10-CM | POA: Diagnosis not present

## 2023-10-19 DIAGNOSIS — K56609 Unspecified intestinal obstruction, unspecified as to partial versus complete obstruction: Secondary | ICD-10-CM | POA: Diagnosis not present

## 2023-10-19 LAB — RENAL FUNCTION PANEL
Albumin: 3.8 g/dL (ref 3.5–5.0)
Anion gap: 11 (ref 5–15)
BUN: 31 mg/dL — ABNORMAL HIGH (ref 8–23)
CO2: 32 mmol/L (ref 22–32)
Calcium: 9.1 mg/dL (ref 8.9–10.3)
Chloride: 99 mmol/L (ref 98–111)
Creatinine, Ser: 1.35 mg/dL — ABNORMAL HIGH (ref 0.61–1.24)
GFR, Estimated: 55 mL/min — ABNORMAL LOW (ref >60)
Glucose, Bld: 110 mg/dL — ABNORMAL HIGH (ref 70–99)
Phosphorus: 3.9 mg/dL (ref 2.5–4.6)
Potassium: 4 mmol/L (ref 3.5–5.1)
Sodium: 142 mmol/L (ref 135–145)

## 2023-10-19 LAB — MAGNESIUM: Magnesium: 2.2 mg/dL (ref 1.7–2.4)

## 2023-10-19 NOTE — Plan of Care (Signed)

## 2023-10-19 NOTE — Progress Notes (Signed)
 CC: SBO Subjective: Patient with small bowel obstruction.  Had NG tube placed yesterday.  There was only 400 recorded but it still appears to be feculent.  He denies any flatus or bowel movements.  He denies any bloating or abdominal pain.  He says that he is hungry and it feels like his stomach is waking up.  Objective: Vital signs in last 24 hours: Temp:  [98.2 F (36.8 C)-98.3 F (36.8 C)] 98.3 F (36.8 C) (09/13 0721) Pulse Rate:  [62-76] 76 (09/13 0721) Resp:  [16-18] 16 (09/13 0721) BP: (135-164)/(71-91) 136/75 (09/13 0721) SpO2:  [96 %-100 %] 96 % (09/13 0721) Last BM Date : 10/17/23  Intake/Output from previous day: 09/12 0701 - 09/13 0700 In: 1551.9 [I.V.:1551.9] Out: 900 [Urine:500; Emesis/NG output:400] Intake/Output this shift: Total I/O In: 150 [NG/GT:150] Out: 300 [Urine:300]  Physical exam:  NG tube in place with feculent output, abdomen is distended and tympanic without any abdominal pain midline surgical scar well-healed  Lab Results: CBC  Recent Labs    10/17/23 1508 10/18/23 0413  WBC 14.5* 10.5  HGB 16.9 13.5  HCT 48.5 39.7  PLT 495* 388   BMET Recent Labs    10/18/23 0413 10/19/23 0622  NA 139 142  K 3.7 4.0  CL 99 99  CO2 32 32  GLUCOSE 129* 110*  BUN 34* 31*  CREATININE 1.48* 1.35*  CALCIUM  9.0 9.1   PT/INR No results for input(s): LABPROT, INR in the last 72 hours. ABG No results for input(s): PHART, HCO3 in the last 72 hours.  Invalid input(s): PCO2, PO2  Studies/Results: DG Abd Portable 1V Result Date: 10/18/2023 EXAM: 1 VIEW XRAY OF THE ABDOMEN 10/18/2023 08:42:00 AM COMPARISON: None available. CLINICAL HISTORY: Encounter for nasogastric (NG) tube placement [id number]. NG placement. FINDINGS: LINES, TUBES AND DEVICES: Enteric tube in place with distal tip and side port terminating within the expected location of the proximal stomach. Left upper quadrant surgical clips noted. Thoracic spine stimulator leads noted. BOWEL:  Dilated small bowel loops within the upper abdomen measuring up to 3.5 cm. This is compatible with a mid to distal small bowel obstruction. There is no free air. SOFT TISSUES: No opaque urinary calculi. BONES: Bilateral sacroiliac arthrodesis hardware noted. No acute osseous abnormality. IMPRESSION: 1. Mid to distal small bowel obstruction. No free air. 2. Enteric tube in place with distal tip and side port terminating within the expected location of the proximal stomach. Electronically signed by: Dorethia Molt MD 10/18/2023 11:31 AM EDT RP Workstation: [id number]   DG Abd 2 Views Result Date: 10/18/2023 CLINICAL DATA:  Small-bowel obstruction EXAM: ABDOMEN - 2 VIEW COMPARISON:  The CT abdomen pelvis dated 10/17/2023, abdominal radiograph dated 07/06/2018 FINDINGS: Gas-filled dilation of multiple loops of right hemi abdominal small bowel, in keeping with finding on prior CT. No free air. Postsurgical changes of bilateral sacroiliac joints. Intrathecal device projects over the left hemipelvis with leads terminating over the partially imaged thoracic spine. Surgical clips project over the lateral left upper quadrant. Additional surgical sutures and lobulated, calcified radiodensity in the left hemiabdomen, better evaluated on prior CT. IMPRESSION: Gas-filled dilation of multiple loops of right hemi abdominal small bowel, in keeping with small-bowel obstruction on prior CT. Electronically Signed   By: Limin  Xu M.D.   On: 10/18/2023 10:27   CT Renal Stone Study Result Date: 10/17/2023 CLINICAL DATA:  Right flank pain. EXAM: CT ABDOMEN AND PELVIS WITHOUT CONTRAST TECHNIQUE: Multidetector CT imaging of the abdomen and pelvis was performed following the  standard protocol without IV contrast. RADIATION DOSE REDUCTION: This exam was performed according to the departmental dose-optimization program which includes automated exposure control, adjustment of the mA and/or kV according to patient size and/or use of  iterative reconstruction technique. COMPARISON:  None Available. FINDINGS: Lower chest: A 4 mm pulmonary nodule seen within the posterolateral aspect of the right lung base. Hepatobiliary: No focal liver abnormality is seen. No gallstones, gallbladder wall thickening, or biliary dilatation. Pancreas: Unremarkable. No pancreatic ductal dilatation or surrounding inflammatory changes. Spleen: Normal in size without focal abnormality. Adrenals/Urinary Tract: Adrenal glands are unremarkable. Kidneys are normal in size, without obstructing renal calculi, focal lesion, or hydronephrosis. 3 mm and 4 mm nonobstructing renal calculi seen within the left kidney. Bladder is unremarkable. Stomach/Bowel: Stomach is within normal limits. Appendix appears normal. The duodenum and proximal jejunum are markedly dilated (approximately 4.1 cm in diameter). A transition zone is noted within the mid left abdomen (axial CT images 34 through 44, CT series 2). Vascular/Lymphatic: Aortic atherosclerosis. No enlarged abdominal or pelvic lymph nodes. Reproductive: Moderate to marked severity prostate gland enlargement is seen with moderate severity left-sided parenchymal calcification. Other: No abdominal wall hernia or abnormality. No abdominopelvic ascites. Musculoskeletal: Postoperative changes are seen within the lower lumbar spine and sacrum. IMPRESSION: 1. Findings consistent with a partial small bowel obstruction with a transition zone noted within the mid left abdomen. 2. Nonobstructing left renal calculi. 3. Moderate to marked severity prostate gland enlargement. Correlation with PSA levels is recommended. 4. 4 mm right lower lobe pulmonary nodule. Follow-up with nonemergent dedicated chest CT is recommended. 5. Aortic atherosclerosis. Electronically Signed   By: Suzen Dials M.D.   On: 10/17/2023 16:29    Anti-infectives: Anti-infectives (From admission, onward)    None       Assessment/Plan:  73 year old with  history of colon resection and some 24 years ago who presented with profuse nausea and vomiting.  CT scan consistent with a small bowel obstruction.  Still distended on exam without any bowel function.  NG tube output is feculent.  We will continue NG tube decompression today.  Likely will attempt Gastrografin challenge tomorrow if he does not start to have bowel function today.  I again discussed with him that he may require operative intervention.  Okay to leave NG tube to gravity after meds and when he up and walking but otherwise continue to low intermittent wall suction.  A total of 35 minutes was spent reviewing the patient's chart, performing an interval history and physical and discussing treatment options with the patient.  Jayson Endow, M.D. [facility]

## 2023-10-19 NOTE — Progress Notes (Addendum)
 Progress Note    [name]  FMW:[id number] DOB: 02-18-1950  DOA: 10/17/2023 PCP: Leavy Mole, PA-C      Brief Narrative:    Medical records reviewed and are as summarized below:  [name] is a 73 y.o. male with medical history significant for smoldering multiple myeloma, arthritis, colon cancer with history of colon resection, depression, GERD, nephrolithiasis, who presented to the hospital with nausea, vomiting and abdominal pain. He was found to have small bowel obstruction.     Assessment/Plan:   Principal Problem:   Partial small bowel obstruction (HCC) Active Problems:   Smoldering multiple myeloma (SMM)   History of colon cancer   Benign prostatic hyperplasia   Acute renal failure superimposed on stage 3a chronic kidney disease (HCC)    Body mass index is 20.36 kg/m.   Small bowel obstruction the patient with history of colon resection for colon cancer: NGT remains in place for gastric decompression.  Analgesics as needed for pain.   IV fluids have expired.  Recommended restarting IV fluids but patient declined.  He said IV fluids causes frequent urination.  I explained that he has underlying CKD and he came in with AKI and dehydration and NPO status may cause dehydration and worsen kidney function.  IV fluids will not be reordered per patient's request. Follow-up with general surgeon for further recommendation   AKI on CKD stage IIIa: Creatinine is slowly trending down, 1.57-1.48-1.35.  He is probably close to baseline kidney function.  I told patient that he has chronic kidney disease.  However, patient said that he was not aware of any diagnosis of chronic kidney disease although he had been told in the past that his creatinine had been elevated for a long time.  Reviewed old BMPs that showed elevated creatinine and low GFR with the patient including labs from June and July 2019. Discussed with diagnosis, status and management of CKD with the patient  and his wife at the bedside.   BPH: Patient was informed of moderate to marked prostate enlargement noted on CT scan.  Continue mirabegron .  PSA is pending.     Smoldering multiple myeloma: No acute issues.  He follows with Dr. Rennie, oncologist, as an outpatient.   Plan of care was discussed with patient and his wife at the bedside.  Diet Order             Diet NPO time specified Except for: Ice Chips  Diet effective now                                  Consultants: General Surgeon  Procedures: None    Medications:    cholecalciferol   2,000 Units Oral Daily   enoxaparin  (LOVENOX ) injection  40 mg Subcutaneous Q24H   mirabegron  ER  25 mg Oral Daily   Continuous Infusions:     Anti-infectives (From admission, onward)    None              Family Communication/Anticipated D/C date and plan/Code Status   DVT prophylaxis: enoxaparin  (LOVENOX ) injection 40 mg Start: 10/17/23 1800     Code Status: Full Code  Family Communication: Plan discussed with his wife at the bedside Disposition Plan: Plan to discharge home   Status is: Inpatient Remains inpatient appropriate because: Small bowel obstruction       Subjective:   Interval events noted.  He complains of abdominal  pain but is not as bad because he has had pain medicine.  No vomiting.  Wife was at the bedside.  Objective:    Vitals:   10/18/23 1803 10/18/23 1944 10/19/23 0416 10/19/23 0721  BP: (!) 164/91 (!) 148/88 135/71 136/75  Pulse: 71 62 64 76  Resp: 18 18 16 16   Temp: 98.2 F (36.8 C) 98.2 F (36.8 C) 98.3 F (36.8 C) 98.3 F (36.8 C)  TempSrc:  Oral Oral   SpO2: 100% 97% 97% 96%  Weight:      Height:       No data found.   Intake/Output Summary (Last 24 hours) at 10/19/2023 1148 Last data filed at 10/19/2023 0951 Gross per 24 hour  Intake 1701.85 ml  Output 1200 ml  Net 501.85 ml   Filed Weights   10/17/23 1505  Weight: 59 kg     Exam:  GEN: NAD SKIN: Warm and dry EYES: No pallor or icterus ENT: MMM, NG tube draining greenish fluid into the canister CV: RRR PULM: CTA B ABD: soft, ND, NT, +BS CNS: AAO x 3, non focal EXT: No edema or tenderness         Data Reviewed:   I have personally reviewed following labs and imaging studies:  Labs: Labs show the following:   Basic Metabolic Panel: Recent Labs  Lab 10/17/23 1508 10/18/23 0413 10/19/23 0622  NA 138 139 142  K 4.0 3.7 4.0  CL 93* 99 99  CO2 28 32 32  GLUCOSE 198* 129* 110*  BUN 33* 34* 31*  CREATININE 1.57* 1.48* 1.35*  CALCIUM  10.7* 9.0 9.1  MG 2.4  --  2.2  PHOS  --   --  3.9   GFR Estimated Creatinine Clearance: 40.1 mL/min (A) (by C-G formula based on SCr of 1.35 mg/dL (H)). Liver Function Tests: Recent Labs  Lab 10/17/23 1508 10/19/23 0622  AST 30  --   ALT 21  --   ALKPHOS 61  --   BILITOT 1.2  --   PROT 8.7*  --   ALBUMIN 4.9 3.8   Recent Labs  Lab 10/17/23 1508  LIPASE 38   No results for input(s): AMMONIA in the last 168 hours. Coagulation profile No results for input(s): INR, PROTIME in the last 168 hours.  CBC: Recent Labs  Lab 10/17/23 1508 10/18/23 0413  WBC 14.5* 10.5  HGB 16.9 13.5  HCT 48.5 39.7  MCV 92.6 94.1  PLT 495* 388   Cardiac Enzymes: No results for input(s): CKTOTAL, CKMB, CKMBINDEX, TROPONINI in the last 168 hours. BNP (last 3 results) No results for input(s): PROBNP in the last 8760 hours. CBG: No results for input(s): GLUCAP in the last 168 hours. D-Dimer: No results for input(s): DDIMER in the last 72 hours. Hgb A1c: No results for input(s): HGBA1C in the last 72 hours. Lipid Profile: No results for input(s): CHOL, HDL, LDLCALC, TRIG, CHOLHDL, LDLDIRECT in the last 72 hours. Thyroid  function studies: No results for input(s): TSH, T4TOTAL, T3FREE, THYROIDAB in the last 72 hours.  Invalid input(s): FREET3 Anemia work up: No  results for input(s): VITAMINB12, FOLATE, FERRITIN, TIBC, IRON, RETICCTPCT in the last 72 hours. Sepsis Labs: Recent Labs  Lab 10/17/23 1508 10/18/23 0413  WBC 14.5* 10.5    Microbiology No results found for this or any previous visit (from the past 240 hours).  Procedures and diagnostic studies:  DG ABD ACUTE 2+V W 1V CHEST Result Date: 10/19/2023 CLINICAL DATA:  73 year old male NG tube  placement. Small-bowel obstruction suspected on CT yesterday. EXAM: DG ABDOMEN ACUTE WITH 1 VIEW CHEST COMPARISON:  CT Abdomen and Pelvis yesterday. Chest radiographs 08/15/2023. FINDINGS: PA view the chest 0650 hours. Enteric tube courses to the epigastrium, tip projects just across midline compatible with distal stomach placement. Chronic thoracic spinal stimulator also projects in the midline. Lung volumes and mediastinal contours within normal limits. No pneumothorax. No pneumoperitoneum identified. Upright and supine views of the abdomen and pelvis. Chronic postoperative changes to the sacrum and SI joint fusion implants. Spinal stimulator generator device redemonstrated on the left. Left upper quadrant surgical clips. Stable enteric tube. Bowel-gas pattern not significantly changed from yesterday. Stable visualized osseous structures. Chronic postoperative changes also to the left humeral head. IMPRESSION: 1. Satisfactory enteric tube placement into the distal stomach. 2. Stable bowel-gas pattern from CT yesterday.  No pneumoperitoneum. 3. No acute cardiopulmonary abnormality. Electronically Signed   By: VEAR Hurst M.D.   On: 10/19/2023 10:56   DG Abd Portable 1V Result Date: 10/18/2023 EXAM: 1 VIEW XRAY OF THE ABDOMEN 10/18/2023 08:42:00 AM COMPARISON: None available. CLINICAL HISTORY: Encounter for nasogastric (NG) tube placement [id number]. NG placement. FINDINGS: LINES, TUBES AND DEVICES: Enteric tube in place with distal tip and side port terminating within the expected location of the proximal  stomach. Left upper quadrant surgical clips noted. Thoracic spine stimulator leads noted. BOWEL: Dilated small bowel loops within the upper abdomen measuring up to 3.5 cm. This is compatible with a mid to distal small bowel obstruction. There is no free air. SOFT TISSUES: No opaque urinary calculi. BONES: Bilateral sacroiliac arthrodesis hardware noted. No acute osseous abnormality. IMPRESSION: 1. Mid to distal small bowel obstruction. No free air. 2. Enteric tube in place with distal tip and side port terminating within the expected location of the proximal stomach. Electronically signed by: Dorethia Molt MD 10/18/2023 11:31 AM EDT RP Workstation: [id number]   DG Abd 2 Views Result Date: 10/18/2023 CLINICAL DATA:  Small-bowel obstruction EXAM: ABDOMEN - 2 VIEW COMPARISON:  The CT abdomen pelvis dated 10/17/2023, abdominal radiograph dated 07/06/2018 FINDINGS: Gas-filled dilation of multiple loops of right hemi abdominal small bowel, in keeping with finding on prior CT. No free air. Postsurgical changes of bilateral sacroiliac joints. Intrathecal device projects over the left hemipelvis with leads terminating over the partially imaged thoracic spine. Surgical clips project over the lateral left upper quadrant. Additional surgical sutures and lobulated, calcified radiodensity in the left hemiabdomen, better evaluated on prior CT. IMPRESSION: Gas-filled dilation of multiple loops of right hemi abdominal small bowel, in keeping with small-bowel obstruction on prior CT. Electronically Signed   By: Limin  Xu M.D.   On: 10/18/2023 10:27   CT Renal Stone Study Result Date: 10/17/2023 CLINICAL DATA:  Right flank pain. EXAM: CT ABDOMEN AND PELVIS WITHOUT CONTRAST TECHNIQUE: Multidetector CT imaging of the abdomen and pelvis was performed following the standard protocol without IV contrast. RADIATION DOSE REDUCTION: This exam was performed according to the departmental dose-optimization program which includes automated  exposure control, adjustment of the mA and/or kV according to patient size and/or use of iterative reconstruction technique. COMPARISON:  None Available. FINDINGS: Lower chest: A 4 mm pulmonary nodule seen within the posterolateral aspect of the right lung base. Hepatobiliary: No focal liver abnormality is seen. No gallstones, gallbladder wall thickening, or biliary dilatation. Pancreas: Unremarkable. No pancreatic ductal dilatation or surrounding inflammatory changes. Spleen: Normal in size without focal abnormality. Adrenals/Urinary Tract: Adrenal glands are unremarkable. Kidneys are normal in size, without obstructing  renal calculi, focal lesion, or hydronephrosis. 3 mm and 4 mm nonobstructing renal calculi seen within the left kidney. Bladder is unremarkable. Stomach/Bowel: Stomach is within normal limits. Appendix appears normal. The duodenum and proximal jejunum are markedly dilated (approximately 4.1 cm in diameter). A transition zone is noted within the mid left abdomen (axial CT images 34 through 44, CT series 2). Vascular/Lymphatic: Aortic atherosclerosis. No enlarged abdominal or pelvic lymph nodes. Reproductive: Moderate to marked severity prostate gland enlargement is seen with moderate severity left-sided parenchymal calcification. Other: No abdominal wall hernia or abnormality. No abdominopelvic ascites. Musculoskeletal: Postoperative changes are seen within the lower lumbar spine and sacrum. IMPRESSION: 1. Findings consistent with a partial small bowel obstruction with a transition zone noted within the mid left abdomen. 2. Nonobstructing left renal calculi. 3. Moderate to marked severity prostate gland enlargement. Correlation with PSA levels is recommended. 4. 4 mm right lower lobe pulmonary nodule. Follow-up with nonemergent dedicated chest CT is recommended. 5. Aortic atherosclerosis. Electronically Signed   By: Suzen Dials M.D.   On: 10/17/2023 16:29               LOS: 2 days    Tondra Reierson  [facility]   Pager on [email or web address]. If 7PM-7AM, please contact night-coverage at [email or web address]     10/19/2023, 11:48 AM

## 2023-10-20 DIAGNOSIS — K56609 Unspecified intestinal obstruction, unspecified as to partial versus complete obstruction: Secondary | ICD-10-CM | POA: Diagnosis not present

## 2023-10-20 DIAGNOSIS — K56600 Partial intestinal obstruction, unspecified as to cause: Secondary | ICD-10-CM | POA: Diagnosis not present

## 2023-10-20 LAB — BASIC METABOLIC PANEL WITH GFR
Anion gap: 19 — ABNORMAL HIGH (ref 5–15)
BUN: 39 mg/dL — ABNORMAL HIGH (ref 8–23)
CO2: 30 mmol/L (ref 22–32)
Calcium: 9.6 mg/dL (ref 8.9–10.3)
Chloride: 94 mmol/L — ABNORMAL LOW (ref 98–111)
Creatinine, Ser: 1.62 mg/dL — ABNORMAL HIGH (ref 0.61–1.24)
GFR, Estimated: 44 mL/min — ABNORMAL LOW (ref >60)
Glucose, Bld: 103 mg/dL — ABNORMAL HIGH (ref 70–99)
Potassium: 3.9 mmol/L (ref 3.5–5.1)
Sodium: 143 mmol/L (ref 135–145)

## 2023-10-20 LAB — PSA, TOTAL AND FREE
PSA, Free Pct: 22.7 %
PSA, Free: 0.34 ng/mL
Prostate Specific Ag, Serum: 1.5 ng/mL (ref 0.0–4.0)

## 2023-10-20 LAB — MAGNESIUM: Magnesium: 2.4 mg/dL (ref 1.7–2.4)

## 2023-10-20 NOTE — Progress Notes (Signed)
 Per MD request, patients NG tube was clamped at 8:30am. Orders given to place on low intermittent suction for 30 minutes at 12pm. Orders given to remove NG tube if output was less than after 30 minutes. Patients output at 12:30 was 20ml. Writer removed NG tube, patient tolerated well. MD notified. Patient was placed on clear liquid diet.

## 2023-10-20 NOTE — Progress Notes (Signed)
 CC: SBO Subjective: Patient with small bowel obstruction.  Started to pass gas today.  Reports feeling less distended Objective: Vital signs in last 24 hours: Temp:  [98.2 F (36.8 C)-99.1 F (37.3 C)] 98.2 F (36.8 C) (09/14 0752) Pulse Rate:  [79-92] 79 (09/14 0752) Resp:  [16-18] 16 (09/14 0752) BP: (128-156)/(74-119) 142/80 (09/14 0752) SpO2:  [94 %-97 %] 94 % (09/14 0411) Last BM Date : 10/17/23  Intake/Output from previous day: 09/13 0701 - 09/14 0700 In: 390 [P.O.:240; NG/GT:150] Out: 1450 [Urine:650; Emesis/NG output:800] Intake/Output this shift: No intake/output data recorded.  Physical exam:  NG tube in place with gastric appearing output abdomen is less distended and not tympanic without any abdominal pain midline surgical scar well-healed  Lab Results: CBC  Recent Labs    10/17/23 1508 10/18/23 0413  WBC 14.5* 10.5  HGB 16.9 13.5  HCT 48.5 39.7  PLT 495* 388   BMET Recent Labs    10/19/23 0622 10/20/23 0729  NA 142 143  K 4.0 3.9  CL 99 94*  CO2 32 30  GLUCOSE 110* 103*  BUN 31* 39*  CREATININE 1.35* 1.62*  CALCIUM  9.1 9.6   PT/INR No results for input(s): LABPROT, INR in the last 72 hours. ABG No results for input(s): PHART, HCO3 in the last 72 hours.  Invalid input(s): PCO2, PO2  Studies/Results: DG ABD ACUTE 2+V W 1V CHEST Result Date: 10/19/2023 CLINICAL DATA:  73 year old male NG tube placement. Small-bowel obstruction suspected on CT yesterday. EXAM: DG ABDOMEN ACUTE WITH 1 VIEW CHEST COMPARISON:  CT Abdomen and Pelvis yesterday. Chest radiographs 08/15/2023. FINDINGS: PA view the chest 0650 hours. Enteric tube courses to the epigastrium, tip projects just across midline compatible with distal stomach placement. Chronic thoracic spinal stimulator also projects in the midline. Lung volumes and mediastinal contours within normal limits. No pneumothorax. No pneumoperitoneum identified. Upright and supine views of the abdomen and  pelvis. Chronic postoperative changes to the sacrum and SI joint fusion implants. Spinal stimulator generator device redemonstrated on the left. Left upper quadrant surgical clips. Stable enteric tube. Bowel-gas pattern not significantly changed from yesterday. Stable visualized osseous structures. Chronic postoperative changes also to the left humeral head. IMPRESSION: 1. Satisfactory enteric tube placement into the distal stomach. 2. Stable bowel-gas pattern from CT yesterday.  No pneumoperitoneum. 3. No acute cardiopulmonary abnormality. Electronically Signed   By: VEAR Hurst M.D.   On: 10/19/2023 10:56    Anti-infectives: Anti-infectives (From admission, onward)    None       Assessment/Plan:  73 year old with history of colon resection and some 24 years ago who presented with profuse nausea and vomiting.  Starting to have bowel function today with flatus.  He underwent a gravity trial for 4 hours and there is scant output after this was hooked back up to suction.  Can remove NG tube and start on clear liquid diet today.  A total of 35 minutes was spent reviewing the patient's chart, performing an interval history and physical and discussing treatment options with the patient.  Jayson Endow, M.D. [facility]

## 2023-10-20 NOTE — Progress Notes (Signed)
 Progress Note    [name]  FMW:[id number] DOB: 02/15/1950  DOA: 10/17/2023 PCP: Leavy Mole, PA-C      Brief Narrative:    Medical records reviewed and are as summarized below:  [name] is a 73 y.o. male with medical history significant for smoldering multiple myeloma, arthritis, colon cancer with history of colon resection, depression, GERD, nephrolithiasis, who presented to the hospital with nausea, vomiting and abdominal pain. He was found to have small bowel obstruction.     Assessment/Plan:   Principal Problem:   Partial small bowel obstruction (HCC) Active Problems:   Smoldering multiple myeloma (SMM)   History of colon cancer   Benign prostatic hyperplasia   Acute renal failure superimposed on stage 3a chronic kidney disease (HCC)    Body mass index is 20.36 kg/m.   Small bowel obstruction the patient with history of colon resection for colon cancer: NGT remains in place for gastric decompression.  Analgesics as needed for pain.   IV fluids have expired.  Recommended restarting IV fluids but patient declined.  He said IV fluids causes frequent urination.  I explained that he has underlying CKD and he came in with AKI and dehydration and NPO status may cause dehydration and worsen kidney function.  IV fluids will not be reordered per patient's request. Follow-up with general surgeon for further recommendation   AKI on CKD stage IIIa: Creatinine is worse today.  Creatinine up from 1.35-1.62.  Discussed management of AKI and recommended IV fluids for hydration.  Again patient declined IV fluids.  He understands that at increased risk for worsening acute kidney injury with potential serious complications.  He said he will be started on clear liquid diet today and he thinks that should be enough.  He also said that yesterday, he was told by Dr. Marinda, surgeon, that he did not need IV fluids.  His wife was present during this discussion and also questioned  the need for IV fluids.  Encouraged adequate oral intake.  I have notified Dr. Marinda, surgeon,  about my recommendations.  Patient has been informed that he likely has chronic kidney disease.  Previously discussed diagnosis and management of CKD with the patient and his wife   BPH: Patient was informed of moderate to marked prostate enlargement noted on CT scan.  Continue mirabegron .  PSA is pending.     Smoldering multiple myeloma: No acute issues.  He follows with Dr. Rennie, oncologist, as an outpatient.       Diet Order             Diet NPO time specified Except for: Ice Chips  Diet effective now                                  Consultants: General Surgeon  Procedures: None    Medications:    cholecalciferol   2,000 Units Oral Daily   enoxaparin  (LOVENOX ) injection  40 mg Subcutaneous Q24H   mirabegron  ER  25 mg Oral Daily   Continuous Infusions:     Anti-infectives (From admission, onward)    None              Family Communication/Anticipated D/C date and plan/Code Status   DVT prophylaxis: enoxaparin  (LOVENOX ) injection 40 mg Start: 10/17/23 1800     Code Status: Full Code  Family Communication: Plan discussed with his wife at the bedside Disposition Plan: Plan  to discharge home   Status is: Inpatient Remains inpatient appropriate because: Small bowel obstruction       Subjective:   Interval events noted.  No complaints.  He said he passed gas this morning.  No bowel movement.  He is excited about starting clear liquid diet.  He is hoping NG tube can be taken out today.  Wife at the bedside.  Objective:    Vitals:   10/19/23 1544 10/19/23 1955 10/20/23 0411 10/20/23 0752  BP: (!) 134/119 (!) 156/86 128/74 (!) 142/80  Pulse: 84 92 85 79  Resp: 17 18 17 16   Temp: 98.3 F (36.8 C) 99.1 F (37.3 C) 99 F (37.2 C) 98.2 F (36.8 C)  TempSrc: Oral Oral    SpO2: 95% 97% 94%   Weight:      Height:        No data found.   Intake/Output Summary (Last 24 hours) at 10/20/2023 1139 Last data filed at 10/20/2023 0542 Gross per 24 hour  Intake 240 ml  Output 1150 ml  Net -910 ml   Filed Weights   10/17/23 1505  Weight: 59 kg    Exam:   GEN: NAD SKIN: Warm and dry EYES: No pallor or icterus ENT: MMM, NG tube in place CV: RRR PULM: CTA B ABD: soft, ND, NT, +BS CNS: AAO x 3, non focal EXT: No edema or tenderness        Data Reviewed:   I have personally reviewed following labs and imaging studies:  Labs: Labs show the following:   Basic Metabolic Panel: Recent Labs  Lab 10/17/23 1508 10/18/23 0413 10/19/23 0622 10/20/23 0729  NA 138 139 142 143  K 4.0 3.7 4.0 3.9  CL 93* 99 99 94*  CO2 28 32 32 30  GLUCOSE 198* 129* 110* 103*  BUN 33* 34* 31* 39*  CREATININE 1.57* 1.48* 1.35* 1.62*  CALCIUM  10.7* 9.0 9.1 9.6  MG 2.4  --  2.2 2.4  PHOS  --   --  3.9  --    GFR Estimated Creatinine Clearance: 33.4 mL/min (A) (by C-G formula based on SCr of 1.62 mg/dL (H)). Liver Function Tests: Recent Labs  Lab 10/17/23 1508 10/19/23 0622  AST 30  --   ALT 21  --   ALKPHOS 61  --   BILITOT 1.2  --   PROT 8.7*  --   ALBUMIN 4.9 3.8   Recent Labs  Lab 10/17/23 1508  LIPASE 38   No results for input(s): AMMONIA in the last 168 hours. Coagulation profile No results for input(s): INR, PROTIME in the last 168 hours.  CBC: Recent Labs  Lab 10/17/23 1508 10/18/23 0413  WBC 14.5* 10.5  HGB 16.9 13.5  HCT 48.5 39.7  MCV 92.6 94.1  PLT 495* 388   Cardiac Enzymes: No results for input(s): CKTOTAL, CKMB, CKMBINDEX, TROPONINI in the last 168 hours. BNP (last 3 results) No results for input(s): PROBNP in the last 8760 hours. CBG: No results for input(s): GLUCAP in the last 168 hours. D-Dimer: No results for input(s): DDIMER in the last 72 hours. Hgb A1c: No results for input(s): HGBA1C in the last 72 hours. Lipid Profile: No results  for input(s): CHOL, HDL, LDLCALC, TRIG, CHOLHDL, LDLDIRECT in the last 72 hours. Thyroid  function studies: No results for input(s): TSH, T4TOTAL, T3FREE, THYROIDAB in the last 72 hours.  Invalid input(s): FREET3 Anemia work up: No results for input(s): VITAMINB12, FOLATE, FERRITIN, TIBC, IRON, RETICCTPCT in the last  72 hours. Sepsis Labs: Recent Labs  Lab 10/17/23 1508 10/18/23 0413  WBC 14.5* 10.5    Microbiology No results found for this or any previous visit (from the past 240 hours).  Procedures and diagnostic studies:  DG ABD ACUTE 2+V W 1V CHEST Result Date: 10/19/2023 CLINICAL DATA:  73 year old male NG tube placement. Small-bowel obstruction suspected on CT yesterday. EXAM: DG ABDOMEN ACUTE WITH 1 VIEW CHEST COMPARISON:  CT Abdomen and Pelvis yesterday. Chest radiographs 08/15/2023. FINDINGS: PA view the chest 0650 hours. Enteric tube courses to the epigastrium, tip projects just across midline compatible with distal stomach placement. Chronic thoracic spinal stimulator also projects in the midline. Lung volumes and mediastinal contours within normal limits. No pneumothorax. No pneumoperitoneum identified. Upright and supine views of the abdomen and pelvis. Chronic postoperative changes to the sacrum and SI joint fusion implants. Spinal stimulator generator device redemonstrated on the left. Left upper quadrant surgical clips. Stable enteric tube. Bowel-gas pattern not significantly changed from yesterday. Stable visualized osseous structures. Chronic postoperative changes also to the left humeral head. IMPRESSION: 1. Satisfactory enteric tube placement into the distal stomach. 2. Stable bowel-gas pattern from CT yesterday.  No pneumoperitoneum. 3. No acute cardiopulmonary abnormality. Electronically Signed   By: VEAR Hurst M.D.   On: 10/19/2023 10:56               LOS: 3 days   Giankarlo Leamer  [facility]   Pager on [email or web address].  If 7PM-7AM, please contact night-coverage at [email or web address]     10/20/2023, 11:39 AM

## 2023-10-20 NOTE — Plan of Care (Signed)

## 2023-10-21 DIAGNOSIS — K56609 Unspecified intestinal obstruction, unspecified as to partial versus complete obstruction: Secondary | ICD-10-CM | POA: Diagnosis not present

## 2023-10-21 DIAGNOSIS — K56600 Partial intestinal obstruction, unspecified as to cause: Secondary | ICD-10-CM | POA: Diagnosis not present

## 2023-10-21 LAB — BASIC METABOLIC PANEL WITH GFR
Anion gap: 9 (ref 5–15)
BUN: 28 mg/dL — ABNORMAL HIGH (ref 8–23)
CO2: 33 mmol/L — ABNORMAL HIGH (ref 22–32)
Calcium: 8.7 mg/dL — ABNORMAL LOW (ref 8.9–10.3)
Chloride: 94 mmol/L — ABNORMAL LOW (ref 98–111)
Creatinine, Ser: 1.19 mg/dL (ref 0.61–1.24)
GFR, Estimated: >60 mL/min (ref >60)
Glucose, Bld: 122 mg/dL — ABNORMAL HIGH (ref 70–99)
Potassium: 3.3 mmol/L — ABNORMAL LOW (ref 3.5–5.1)
Sodium: 136 mmol/L (ref 135–145)

## 2023-10-21 MED ORDER — POTASSIUM CHLORIDE CRYS ER 20 MEQ PO TBCR
40.0000 meq | EXTENDED_RELEASE_TABLET | Freq: Once | ORAL | Status: AC
Start: 2023-10-21 — End: 2023-10-21
  Administered 2023-10-21: 40 meq via ORAL
  Filled 2023-10-21: qty 2

## 2023-10-21 NOTE — Plan of Care (Signed)

## 2023-10-21 NOTE — Progress Notes (Signed)
 [facility] SURGICAL PROGRESS NOTE (cpt [phone])  Hospital Day(s): 4.   Interval History: Patient seen and examined, no acute events or new complaints overnight. Patient reports he is feeling much better. He feels his abdomen is at baseline. He denies fever, chills, nausea, emesis. Renal function normal; sCr - 1.19; UO - unmeausred. Hypokalemia to 3.3. He is on CLD; hungry. He is passing flatus; no BM.   Review of Systems:  Constitutional: denies fever, chills  HEENT: denies cough or congestion  Respiratory: denies any shortness of breath  Cardiovascular: denies chest pain or palpitations  Gastrointestinal: denies abdominal pain, N/V Genitourinary: denies burning with urination or urinary frequency Musculoskeletal: denies pain, decreased motor or sensation  Vital signs in last 24 hours: [min-max] current  Temp:  [97.9 F (36.6 C)-98.4 F (36.9 C)] 97.9 F (36.6 C) (09/15 0728) Pulse Rate:  [69-88] 69 (09/15 0728) Resp:  [14-16] 16 (09/15 0728) BP: (125-139)/(75-86) 125/85 (09/15 0728) SpO2:  [93 %-98 %] 98 % (09/15 0728)     Height: 5' 7 (170.2 cm) Weight: 59 kg BMI (Calculated): 20.36   Intake/Output last 2 shifts:  09/14 0701 - 09/15 0700 In: 660 [P.O.:660] Out: 750 [Urine:400; Emesis/NG output:350]   Physical Exam:  Constitutional: alert, cooperative and no distress  HENT: normocephalic without obvious abnormality  Respiratory: breathing non-labored at rest  Cardiovascular: regular rate and sinus rhythm  Gastrointestinal: soft, non-tender, and non-distended   Labs:     Latest Ref Rng & Units 10/18/2023    4:13 AM 10/17/2023    3:08 PM 08/15/2023   11:10 AM  CBC  WBC 4.0 - 10.5 K/uL 10.5  14.5  8.2   Hemoglobin 13.0 - 17.0 g/dL 86.4  83.0  84.8   Hematocrit 39.0 - 52.0 % 39.7  48.5  46.5   Platelets 150 - 400 K/uL 388  495  415       Latest Ref Rng & Units 10/21/2023    4:38 AM 10/20/2023    7:29 AM 10/19/2023    6:22 AM  CMP  Glucose 70 - 99 mg/dL  877  896  889   BUN 8 - 23 mg/dL 28  39  31   Creatinine 0.61 - 1.24 mg/dL 8.80  8.37  8.64   Sodium 135 - 145 mmol/L 136  143  142   Potassium 3.5 - 5.1 mmol/L 3.3  3.9  4.0   Chloride 98 - 111 mmol/L 94  94  99   CO2 22 - 32 mmol/L 33  30  32   Calcium  8.9 - 10.3 mg/dL 8.7  9.6  9.1      Imaging studies: No new pertinent imaging studies   Assessment/Plan: (ICD-10's: K59.609) 73 y.o. male with clinically resolving small bowel obstruction.               - Advanced to FLD this AM; Okay for soft diet for dinner if doing well; ADAT orders placed   - Replete K - No need for emergent surgical intervention - Monitor abdominal examination; on-going bowel function              - Pain control prn; antiemetics prn              - Mobilize             - Further management per primary service; we will follow along    - Discharge Planning; SBO resolving, diet advancing. IF he tolerates advancement today, okay for DC  tomorrow morning.   All of the above findings and recommendations were discussed with the patient, patient's family (wife at bedside), and the medical team, and all of patient's and family's questions were answered to their expressed satisfaction.  -- Arthea Platt, PA-C [facility] 10/21/2023, 8:17 AM M-F: 7am - 4pm

## 2023-10-21 NOTE — Progress Notes (Signed)
 Progress Note    [name]  FMW:[id number] DOB: 27-Apr-1950  DOA: 10/17/2023 PCP: Leavy Mole, PA-C      Brief Narrative:    Medical records reviewed and are as summarized below:  [name] is a 73 y.o. male with medical history significant for smoldering multiple myeloma, arthritis, colon cancer with history of colon resection, depression, GERD, nephrolithiasis, who presented to the hospital with nausea, vomiting and abdominal pain. He was found to have small bowel obstruction.     Assessment/Plan:   Principal Problem:   Partial small bowel obstruction (HCC) Active Problems:   Smoldering multiple myeloma (SMM)   History of colon cancer   Benign prostatic hyperplasia   Acute renal failure superimposed on stage 3a chronic kidney disease (HCC)    Body mass index is 20.36 kg/m.   Small bowel obstruction the patient with history of colon resection for colon cancer: Improved.  Diet has been advanced to full liquid diet.  Follow-up with general surgeon for further recommendations.   AKI on CKD stage IIIa: Improved.  Creatinine is better today.  Down from 1.62-1.19.    Hypokalemia: Replete potassium and monitor levels   BPH: Patient was informed of moderate to marked prostate enlargement noted on CT scan.  Continue mirabegron .   PSA is normal.   Smoldering multiple myeloma: No acute issues.  He follows with Dr. Rennie, oncologist, as an outpatient.   Plan of care and test results were discussed with patient and his wife at the bedside.    Diet Order             Diet full liquid Fluid consistency: Thin  Diet effective now                                  Consultants: General Surgeon  Procedures: None    Medications:    cholecalciferol   2,000 Units Oral Daily   enoxaparin  (LOVENOX ) injection  40 mg Subcutaneous Q24H   mirabegron  ER  25 mg Oral Daily   potassium chloride   40 mEq Oral Once   Continuous  Infusions:     Anti-infectives (From admission, onward)    None              Family Communication/Anticipated D/C date and plan/Code Status   DVT prophylaxis: enoxaparin  (LOVENOX ) injection 40 mg Start: 10/17/23 1800     Code Status: Full Code  Family Communication: Plan discussed with his wife at the bedside Disposition Plan: Plan to discharge home   Status is: Inpatient Remains inpatient appropriate because: Small bowel obstruction       Subjective:   Interval events noted.  No complaints.  He has been tolerating clear liquid diet.  He has been passing flatus but no bowel movement.  His wife was at the bedside. Aldo, RN, was at the bedside  Objective:    Vitals:   10/20/23 1518 10/20/23 2030 10/21/23 0458 10/21/23 0728  BP: 133/86 136/75 139/81 125/85  Pulse: 88 86 73 69  Resp: 14 16 16 16   Temp: 98.1 F (36.7 C) 98.2 F (36.8 C) 98.4 F (36.9 C) 97.9 F (36.6 C)  TempSrc: Oral  Oral Oral  SpO2: 93% 96% 97% 98%  Weight:      Height:       No data found.   Intake/Output Summary (Last 24 hours) at 10/21/2023 1200 Last data filed at 10/21/2023 0900 Gross  per 24 hour  Intake 1140 ml  Output 420 ml  Net 720 ml   Filed Weights   10/17/23 1505  Weight: 59 kg    Exam:   GEN: NAD SKIN: No rash EYES: Anicteric ENT: MMM CV: RRR PULM: CTA B ABD: soft, ND, NT, +BS CNS: AAO x 3, non focal EXT: No edema or tenderness       Data Reviewed:   I have personally reviewed following labs and imaging studies:  Labs: Labs show the following:   Basic Metabolic Panel: Recent Labs  Lab 10/17/23 1508 10/18/23 0413 10/19/23 0622 10/20/23 0729 10/21/23 0438  NA 138 139 142 143 136  K 4.0 3.7 4.0 3.9 3.3*  CL 93* 99 99 94* 94*  CO2 28 32 32 30 33*  GLUCOSE 198* 129* 110* 103* 122*  BUN 33* 34* 31* 39* 28*  CREATININE 1.57* 1.48* 1.35* 1.62* 1.19  CALCIUM  10.7* 9.0 9.1 9.6 8.7*  MG 2.4  --  2.2 2.4  --   PHOS  --   --  3.9  --   --     GFR Estimated Creatinine Clearance: 45.4 mL/min (by C-G formula based on SCr of 1.19 mg/dL). Liver Function Tests: Recent Labs  Lab 10/17/23 1508 10/19/23 0622  AST 30  --   ALT 21  --   ALKPHOS 61  --   BILITOT 1.2  --   PROT 8.7*  --   ALBUMIN 4.9 3.8   Recent Labs  Lab 10/17/23 1508  LIPASE 38   No results for input(s): AMMONIA in the last 168 hours. Coagulation profile No results for input(s): INR, PROTIME in the last 168 hours.  CBC: Recent Labs  Lab 10/17/23 1508 10/18/23 0413  WBC 14.5* 10.5  HGB 16.9 13.5  HCT 48.5 39.7  MCV 92.6 94.1  PLT 495* 388   Cardiac Enzymes: No results for input(s): CKTOTAL, CKMB, CKMBINDEX, TROPONINI in the last 168 hours. BNP (last 3 results) No results for input(s): PROBNP in the last 8760 hours. CBG: No results for input(s): GLUCAP in the last 168 hours. D-Dimer: No results for input(s): DDIMER in the last 72 hours. Hgb A1c: No results for input(s): HGBA1C in the last 72 hours. Lipid Profile: No results for input(s): CHOL, HDL, LDLCALC, TRIG, CHOLHDL, LDLDIRECT in the last 72 hours. Thyroid  function studies: No results for input(s): TSH, T4TOTAL, T3FREE, THYROIDAB in the last 72 hours.  Invalid input(s): FREET3 Anemia work up: No results for input(s): VITAMINB12, FOLATE, FERRITIN, TIBC, IRON, RETICCTPCT in the last 72 hours. Sepsis Labs: Recent Labs  Lab 10/17/23 1508 10/18/23 0413  WBC 14.5* 10.5    Microbiology No results found for this or any previous visit (from the past 240 hours).  Procedures and diagnostic studies:  No results found.              LOS: 4 days   Thomos Domine  [facility]   Pager on [email or web address]. If 7PM-7AM, please contact night-coverage at [email or web address]     10/21/2023, 12:00 PM

## 2023-10-22 DIAGNOSIS — K56609 Unspecified intestinal obstruction, unspecified as to partial versus complete obstruction: Secondary | ICD-10-CM | POA: Diagnosis not present

## 2023-10-22 DIAGNOSIS — K56600 Partial intestinal obstruction, unspecified as to cause: Secondary | ICD-10-CM | POA: Diagnosis not present

## 2023-10-22 LAB — BASIC METABOLIC PANEL WITH GFR
Anion gap: 8 (ref 5–15)
BUN: 18 mg/dL (ref 8–23)
CO2: 31 mmol/L (ref 22–32)
Calcium: 8.6 mg/dL — ABNORMAL LOW (ref 8.9–10.3)
Chloride: 96 mmol/L — ABNORMAL LOW (ref 98–111)
Creatinine, Ser: 1.15 mg/dL (ref 0.61–1.24)
GFR, Estimated: >60 mL/min (ref >60)
Glucose, Bld: 134 mg/dL — ABNORMAL HIGH (ref 70–99)
Potassium: 3.5 mmol/L (ref 3.5–5.1)
Sodium: 135 mmol/L (ref 135–145)

## 2023-10-22 NOTE — Discharge Summary (Signed)
 Physician Discharge Summary   Patient: [name] MRN: [id number] DOB: 04-Apr-1950  Admit date:     10/17/2023  Discharge date: 10/22/23  Discharge Physician: AIDA CHO   PCP: Leavy Mole, PA-C   Recommendations at discharge:   Follow-up with PCP in 1 week  Discharge Diagnoses: Principal Problem:   Partial small bowel obstruction (HCC) Active Problems:   Smoldering multiple myeloma (SMM)   History of colon cancer   Benign prostatic hyperplasia   Acute renal failure superimposed on stage 3a chronic kidney disease (HCC)  Resolved Problems:   * No resolved hospital problems. *  Hospital Course:  [name] is a 73 y.o. male with medical history significant for smoldering multiple myeloma, arthritis, colon cancer with history of colon resection, depression, GERD, nephrolithiasis, who presented to the hospital with nausea, vomiting and abdominal pain. He was found to have small bowel obstruction.   Assessment and Plan:   Small bowel obstruction the patient with history of colon resection for colon cancer: Resolved.  He is tolerating regular diet.  He had a bowel movement. He is cleared for discharge from surgeon standpoint.    AKI on CKD stage IIIa: Improved.  Probable has underlying CKD but creatinine and GFR has been fluctuating.   Hypokalemia: Improved     BPH: Patient was informed of moderate to marked prostate enlargement noted on CT scan.  Continue mirabegron .   PSA is normal.     Smoldering multiple myeloma: No acute issues.  He follows with Dr. Rennie, oncologist, as an outpatient.     His condition has improved and is deemed stable for discharge home today.  Discharge plans discussed with patient and his wife at the bedside.       Consultants: General Surgeon Procedures performed: None Disposition: Home Diet recommendation:  Discharge Diet Orders (From admission, onward)     Start     Ordered   10/22/23 0000  Diet - low sodium heart  healthy        10/22/23 1022           Cardiac diet DISCHARGE MEDICATION: Allergies as of 10/22/2023       Reactions   Bee Venom Swelling        Medication List     TAKE these medications    acetaminophen  650 MG CR tablet Commonly known as: TYLENOL  Take 1,300 mg by mouth every 8 (eight) hours as needed for pain.   Centrum Silver Ultra Mens Tabs Take 1 tablet by mouth daily.   cholecalciferol  25 MCG (1000 UNIT) tablet Commonly known as: VITAMIN D3 Take 2,000 Units by mouth daily.   Gemtesa  75 MG Tabs Generic drug: Vibegron  Take 1 tablet (75 mg total) by mouth daily.   tiZANidine  4 MG tablet Commonly known as: Zanaflex  Take 0.5-1.5 tablets (2-6 mg total) by mouth every 8 (eight) hours as needed for muscle spasms (muscle tightness).        Follow-up Information     [facility] Follow up.   Specialty: General Surgery Why: As needed Contact information: [address] [place]  [address] [phone]               Discharge Exam: Fredricka Weights   10/17/23 1505  Weight: 59 kg   GEN: NAD SKIN: Warm and dry EYES: No pallor or icterus ENT: MMM CV: RRR PULM: CTA B ABD: soft, ND, NT, +BS CNS: AAO x 3, non focal EXT: No edema or  tenderness   Condition at discharge: good  The results of significant diagnostics from this hospitalization (including imaging, microbiology, ancillary and laboratory) are listed below for reference.   Imaging Studies: DG ABD ACUTE 2+V W 1V CHEST Result Date: 10/19/2023 CLINICAL DATA:  73 year old male NG tube placement. Small-bowel obstruction suspected on CT yesterday. EXAM: DG ABDOMEN ACUTE WITH 1 VIEW CHEST COMPARISON:  CT Abdomen and Pelvis yesterday. Chest radiographs 08/15/2023. FINDINGS: PA view the chest 0650 hours. Enteric tube courses to the epigastrium, tip projects just across midline compatible with distal stomach placement. Chronic  thoracic spinal stimulator also projects in the midline. Lung volumes and mediastinal contours within normal limits. No pneumothorax. No pneumoperitoneum identified. Upright and supine views of the abdomen and pelvis. Chronic postoperative changes to the sacrum and SI joint fusion implants. Spinal stimulator generator device redemonstrated on the left. Left upper quadrant surgical clips. Stable enteric tube. Bowel-gas pattern not significantly changed from yesterday. Stable visualized osseous structures. Chronic postoperative changes also to the left humeral head. IMPRESSION: 1. Satisfactory enteric tube placement into the distal stomach. 2. Stable bowel-gas pattern from CT yesterday.  No pneumoperitoneum. 3. No acute cardiopulmonary abnormality. Electronically Signed   By: VEAR Hurst M.D.   On: 10/19/2023 10:56   DG Abd Portable 1V Result Date: 10/18/2023 EXAM: 1 VIEW XRAY OF THE ABDOMEN 10/18/2023 08:42:00 AM COMPARISON: None available. CLINICAL HISTORY: Encounter for nasogastric (NG) tube placement [id number]. NG placement. FINDINGS: LINES, TUBES AND DEVICES: Enteric tube in place with distal tip and side port terminating within the expected location of the proximal stomach. Left upper quadrant surgical clips noted. Thoracic spine stimulator leads noted. BOWEL: Dilated small bowel loops within the upper abdomen measuring up to 3.5 cm. This is compatible with a mid to distal small bowel obstruction. There is no free air. SOFT TISSUES: No opaque urinary calculi. BONES: Bilateral sacroiliac arthrodesis hardware noted. No acute osseous abnormality. IMPRESSION: 1. Mid to distal small bowel obstruction. No free air. 2. Enteric tube in place with distal tip and side port terminating within the expected location of the proximal stomach. Electronically signed by: Dorethia Molt MD 10/18/2023 11:31 AM EDT RP Workstation: [id number]   DG Abd 2 Views Result Date: 10/18/2023 CLINICAL DATA:  Small-bowel obstruction EXAM: ABDOMEN  - 2 VIEW COMPARISON:  The CT abdomen pelvis dated 10/17/2023, abdominal radiograph dated 07/06/2018 FINDINGS: Gas-filled dilation of multiple loops of right hemi abdominal small bowel, in keeping with finding on prior CT. No free air. Postsurgical changes of bilateral sacroiliac joints. Intrathecal device projects over the left hemipelvis with leads terminating over the partially imaged thoracic spine. Surgical clips project over the lateral left upper quadrant. Additional surgical sutures and lobulated, calcified radiodensity in the left hemiabdomen, better evaluated on prior CT. IMPRESSION: Gas-filled dilation of multiple loops of right hemi abdominal small bowel, in keeping with small-bowel obstruction on prior CT. Electronically Signed   By: Limin  Xu M.D.   On: 10/18/2023 10:27   CT Renal Stone Study Result Date: 10/17/2023 CLINICAL DATA:  Right flank pain. EXAM: CT ABDOMEN AND PELVIS WITHOUT CONTRAST TECHNIQUE: Multidetector CT imaging of the abdomen and pelvis was performed following the standard protocol without IV contrast. RADIATION DOSE REDUCTION: This exam was performed according to the departmental dose-optimization program which includes automated exposure control, adjustment of the mA and/or kV according to patient size and/or use of iterative reconstruction technique. COMPARISON:  None Available. FINDINGS: Lower chest: A 4 mm pulmonary nodule seen within the posterolateral aspect of the  right lung base. Hepatobiliary: No focal liver abnormality is seen. No gallstones, gallbladder wall thickening, or biliary dilatation. Pancreas: Unremarkable. No pancreatic ductal dilatation or surrounding inflammatory changes. Spleen: Normal in size without focal abnormality. Adrenals/Urinary Tract: Adrenal glands are unremarkable. Kidneys are normal in size, without obstructing renal calculi, focal lesion, or hydronephrosis. 3 mm and 4 mm nonobstructing renal calculi seen within the left kidney. Bladder is  unremarkable. Stomach/Bowel: Stomach is within normal limits. Appendix appears normal. The duodenum and proximal jejunum are markedly dilated (approximately 4.1 cm in diameter). A transition zone is noted within the mid left abdomen (axial CT images 34 through 44, CT series 2). Vascular/Lymphatic: Aortic atherosclerosis. No enlarged abdominal or pelvic lymph nodes. Reproductive: Moderate to marked severity prostate gland enlargement is seen with moderate severity left-sided parenchymal calcification. Other: No abdominal wall hernia or abnormality. No abdominopelvic ascites. Musculoskeletal: Postoperative changes are seen within the lower lumbar spine and sacrum. IMPRESSION: 1. Findings consistent with a partial small bowel obstruction with a transition zone noted within the mid left abdomen. 2. Nonobstructing left renal calculi. 3. Moderate to marked severity prostate gland enlargement. Correlation with PSA levels is recommended. 4. 4 mm right lower lobe pulmonary nodule. Follow-up with nonemergent dedicated chest CT is recommended. 5. Aortic atherosclerosis. Electronically Signed   By: Suzen Dials M.D.   On: 10/17/2023 16:29    Microbiology: Results for orders placed or performed in visit on 08/15/23  Blood culture (routine single)     Status: None   Collection Time: 08/15/23 11:10 AM   Specimen: Blood  Result Value Ref Range Status   MICRO NUMBER: [id number]  Final   SPECIMEN QUALITY: Adequate  Final   Source BLOOD 1  Final   STATUS: FINAL  Final   Result: No growth after 5 days  Final   COMMENT: Aerobic and anaerobic bottle received.  Final  Blood culture (routine single)     Status: None   Collection Time: 08/15/23 11:45 AM   Specimen: Blood  Result Value Ref Range Status   MICRO NUMBER: [id number]  Final   SPECIMEN QUALITY: Adequate  Final   Source BLOOD 2  Final   STATUS: FINAL  Final   Result: No growth after 5 days  Final   COMMENT: Aerobic bottle only received.  Final     Labs: CBC: Recent Labs  Lab 10/17/23 1508 10/18/23 0413  WBC 14.5* 10.5  HGB 16.9 13.5  HCT 48.5 39.7  MCV 92.6 94.1  PLT 495* 388   Basic Metabolic Panel: Recent Labs  Lab 10/17/23 1508 10/18/23 0413 10/19/23 0622 10/20/23 0729 10/21/23 0438 10/22/23 0407  NA 138 139 142 143 136 135  K 4.0 3.7 4.0 3.9 3.3* 3.5  CL 93* 99 99 94* 94* 96*  CO2 28 32 32 30 33* 31  GLUCOSE 198* 129* 110* 103* 122* 134*  BUN 33* 34* 31* 39* 28* 18  CREATININE 1.57* 1.48* 1.35* 1.62* 1.19 1.15  CALCIUM  10.7* 9.0 9.1 9.6 8.7* 8.6*  MG 2.4  --  2.2 2.4  --   --   PHOS  --   --  3.9  --   --   --    Liver Function Tests: Recent Labs  Lab 10/17/23 1508 10/19/23 0622  AST 30  --   ALT 21  --   ALKPHOS 61  --   BILITOT 1.2  --   PROT 8.7*  --   ALBUMIN 4.9 3.8   CBG: No results for input(s):  GLUCAP in the last 168 hours.  Discharge time spent: greater than 30 minutes.  Signed: AIDA CHO, MD [facility] 10/22/2023

## 2023-10-22 NOTE — Progress Notes (Signed)
 [name] SURGICAL PROGRESS NOTE (cpt [phone])  Hospital Day(s): 5.   Interval History: Patient seen and examined, no acute events or new complaints overnight. Patient reports he continues to do well. No abdominal pain. He is ready to go home. He denies fever, chills, nausea, emesis. Renal function normal; sCr - 1.15; UO - unmeausred. He is on soft diet; no issues. Continues to have bowel function.   Review of Systems:  Constitutional: denies fever, chills  HEENT: denies cough or congestion  Respiratory: denies any shortness of breath  Cardiovascular: denies chest pain or palpitations  Gastrointestinal: denies abdominal pain, N/V Genitourinary: denies burning with urination or urinary frequency Musculoskeletal: denies pain, decreased motor or sensation  Vital signs in last 24 hours: [min-max] current  Temp:  [97.9 F (36.6 C)-98.7 F (37.1 C)] 98.3 F (36.8 C) (09/16 0408) Pulse Rate:  [69-79] 69 (09/16 0408) Resp:  [16] 16 (09/16 0408) BP: (125-146)/(80-87) 138/82 (09/16 0408) SpO2:  [98 %-100 %] 99 % (09/16 0408)     Height: 5' 7 (170.2 cm) Weight: 59 kg BMI (Calculated): 20.36   Intake/Output last 2 shifts:  09/15 0701 - 09/16 0700 In: 960 [P.O.:960] Out: -    Physical Exam:  Constitutional: alert, cooperative and no distress  HENT: normocephalic without obvious abnormality  Respiratory: breathing non-labored at rest  Cardiovascular: regular rate and sinus rhythm  Gastrointestinal: soft, non-tender, and non-distended   Labs:     Latest Ref Rng & Units 10/18/2023    4:13 AM 10/17/2023    3:08 PM 08/15/2023   11:10 AM  CBC  WBC 4.0 - 10.5 K/uL 10.5  14.5  8.2   Hemoglobin 13.0 - 17.0 g/dL 86.4  83.0  84.8   Hematocrit 39.0 - 52.0 % 39.7  48.5  46.5   Platelets 150 - 400 K/uL 388  495  415       Latest Ref Rng & Units 10/22/2023    4:07 AM 10/21/2023    4:38 AM 10/20/2023    7:29 AM  CMP  Glucose 70 - 99 mg/dL 865  877  896   BUN 8 - 23 mg/dL 18   28  39   Creatinine 0.61 - 1.24 mg/dL 8.84  8.80  8.37   Sodium 135 - 145 mmol/L 135  136  143   Potassium 3.5 - 5.1 mmol/L 3.5  3.3  3.9   Chloride 98 - 111 mmol/L 96  94  94   CO2 22 - 32 mmol/L 31  33  30   Calcium  8.9 - 10.3 mg/dL 8.6  8.7  9.6      Imaging studies: No new pertinent imaging studies   Assessment/Plan: (ICD-10's: K57.609) 73 y.o. male with clinically resolving small bowel obstruction.               - Okay to continue diet as tolerated - No need for emergent surgical intervention - Monitor abdominal examination; on-going bowel function              - Pain control prn; antiemetics prn              - Mobilize             - Further management per primary service; we will follow along    - Discharge Planning; Okay for discharge from surgical perspective. He does NOT need surgical follow up but I will leave our contact information.   All of the above findings and recommendations were  discussed with the patient, patient's family (wife at bedside), and the medical team, and all of patient's and family's questions were answered to their expressed satisfaction.  -- Arthea Platt, PA-C [facility] 10/22/2023, 7:10 AM M-F: 7am - 4pm

## 2023-10-23 ENCOUNTER — Ambulatory Visit (INDEPENDENT_AMBULATORY_CARE_PROVIDER_SITE_OTHER): Admitting: Urology

## 2023-10-23 VITALS — BP 172/99 | HR 66 | Ht 67.0 in | Wt 127.3 lb

## 2023-10-23 DIAGNOSIS — N3281 Overactive bladder: Secondary | ICD-10-CM | POA: Diagnosis not present

## 2023-10-23 DIAGNOSIS — R399 Unspecified symptoms and signs involving the genitourinary system: Secondary | ICD-10-CM

## 2023-10-23 DIAGNOSIS — Z125 Encounter for screening for malignant neoplasm of prostate: Secondary | ICD-10-CM

## 2023-10-23 LAB — BLADDER SCAN AMB NON-IMAGING

## 2023-10-23 MED ORDER — OXYBUTYNIN CHLORIDE ER 15 MG PO TB24: 15.0000 mg | ORAL_TABLET | Freq: Every day | ORAL | 11 refills | Status: DC

## 2023-10-23 NOTE — Progress Notes (Signed)
   10/23/2023 10:35 AM   [name] 12-01-1950 [id number]  Reason for visit: Follow up urinary symptoms, ED, PSA screening, nephrolithiasis  History: Long history of bothersome urinary symptoms with urgency, frequency, nocturia 3-4 times at night.  No improvement on alpha blockers.  Approximately 20% improvement on oxybutynin  Cystoscopy 2023 showed no significant prostate enlargement, normal bladder, no trabeculations Pelvic floor PT was not helpful Prostate measured 47 g on CT, otherwise no significant urologic abnormalities Urodynamics 2025 Max capacity , first sensation 88ml, instability and unstable contractions present.  Max flow 62ml/s and max pressure 19cm water  Physical Exam: BP (!) 172/99 (BP Location: Left Arm, Patient Position: Sitting, Cuff Size: Normal)   Pulse 66   Ht 5' 7 (1.702 m)   Wt 127 lb 4.8 oz (57.7 kg)   SpO2 98%   BMI 19.94 kg/m   Imaging/labs: PSA September 2025 normal at 1.5  Today: Recently hospitalized for small bowel obstruction, I reviewed those notes Recently trialed Gemtesa , no significant improvement compared to oxybutynin  prior, he would like to go back to the oxybutynin  based on cost.  He is also interested in PTNS PVR normal again today at 0ml He recently filled out a voiding diary and most voids were approximately  Plan:   OAB: Oxybutynin  15 mg XL, will schedule PTNS.  Main issue is small capacity bladder PSA screening: Normal PSA, with his good health can continue screening another couple years RTC 1 to 2 months after completing PTNS   Redell JAYSON Burnet, MD  [facility] [address] [place], [place] [address] [phone]

## 2023-10-23 NOTE — Patient Instructions (Signed)

## 2023-10-24 ENCOUNTER — Ambulatory Visit (INDEPENDENT_AMBULATORY_CARE_PROVIDER_SITE_OTHER): Admitting: Family Medicine

## 2023-10-24 ENCOUNTER — Encounter: Payer: Self-pay | Admitting: Family Medicine

## 2023-10-24 ENCOUNTER — Ambulatory Visit: Payer: Self-pay

## 2023-10-24 ENCOUNTER — Telehealth: Payer: Self-pay | Admitting: Surgery

## 2023-10-24 VITALS — BP 138/80 | HR 79 | Temp 98.2°F | Resp 16 | Ht 67.0 in | Wt 127.0 lb

## 2023-10-24 DIAGNOSIS — C9000 Multiple myeloma not having achieved remission: Secondary | ICD-10-CM

## 2023-10-24 DIAGNOSIS — Z09 Encounter for follow-up examination after completed treatment for conditions other than malignant neoplasm: Secondary | ICD-10-CM

## 2023-10-24 DIAGNOSIS — G8929 Other chronic pain: Secondary | ICD-10-CM

## 2023-10-24 DIAGNOSIS — Z8719 Personal history of other diseases of the digestive system: Secondary | ICD-10-CM | POA: Insufficient documentation

## 2023-10-24 DIAGNOSIS — I10 Essential (primary) hypertension: Secondary | ICD-10-CM

## 2023-10-24 DIAGNOSIS — J029 Acute pharyngitis, unspecified: Secondary | ICD-10-CM

## 2023-10-24 MED ORDER — TIZANIDINE HCL 4 MG PO TABS: 2.0000 mg | ORAL_TABLET | Freq: Three times a day (TID) | ORAL | 3 refills | Status: AC | PRN

## 2023-10-24 MED ORDER — LIDOCAINE VISCOUS HCL 2 % MT SOLN: 5.0000 mL | Freq: Four times a day (QID) | OROMUCOSAL | 0 refills | Status: AC | PRN

## 2023-10-24 NOTE — Progress Notes (Signed)
 Name: [name]   MRN: [id number]    DOB: 1950-02-22   Date:10/24/2023       Progress Note  Chief Complaint  Patient presents with   Hospitalization Follow-up   Sore Throat    Since last Thursday     Subjective:   [name] is a 73 y.o. male, presents to clinic for HFU  Here for hospital follow up/transition of care.  Admit date: 10/17/2023 Discharge date: 10/22/2023 Transition of care call unfortunately not done  Admit date:     10/17/2023  Discharge date: 10/22/23  Discharge Physician: AIDA CHO    PCP: Leavy Mole, PA-C    Recommendations at discharge:    Follow-up with PCP in 1 week   Discharge Diagnoses: Principal Problem:   Partial small bowel obstruction (HCC) Active Problems:   Smoldering multiple myeloma (SMM)   History of colon cancer   Benign prostatic hyperplasia   Acute renal failure superimposed on stage 3a chronic kidney disease (HCC)   Resolved Problems:   * No resolved hospital problems. *   Hospital Course:   [name] is a 73 y.o. male with medical history significant for smoldering multiple myeloma, arthritis, colon cancer with history of colon resection, depression, GERD, nephrolithiasis, who presented to the hospital with nausea, vomiting and abdominal pain. He was found to have small bowel obstruction.     Assessment and Plan:     Small bowel obstruction the patient with history of colon resection for colon cancer: Resolved.  He is tolerating regular diet.  He had a bowel movement. He is cleared for discharge from surgeon standpoint.     AKI on CKD stage IIIa: Improved.  Probable has underlying CKD but creatinine and GFR has been fluctuating.     Hypokalemia: Improved     BPH: Patient was informed of moderate to marked prostate enlargement noted on CT scan.  Continue mirabegron .   PSA is normal.     Smoldering multiple myeloma: No acute issues.  He follows with Dr. Rennie, oncologist, as an outpatient.      His condition has improved and is deemed stable for discharge home today.  Discharge plans discussed with patient and his wife at the bedside.           Consultants: General Surgeon Procedures performed: None Disposition: Home  He followed up with urology yesterday  Here for HFU today, complains of sore throat, he lost 5 lbs in hospital and his back hurts from laying around too much, no abd pain bloating, N, V and he is passing gas and having BM  Discussed the use of AI scribe software for clinical note transcription with the patient, who gave verbal consent to proceed.  History of Present Illness [name] is a 73 year old male who presents for a hospital follow-up after a small bowel obstruction.  Small bowel obstruction and hospitalization - Hospitalized from September 11th to 16th for small bowel obstruction - NG tube placed through right nostril during hospitalization, removed on September 15th - History of colon cancer with resection and small bowel surgery in 2001, resulting in scar tissue  Oropharyngeal pain and dysphagia - Sore throat described as 'raw and dry' since NG tube placement and removal - Hoarseness and pain radiating to left ear - pain with swallowing, able to swallow liquids/solids, but trying to consuming soft foods (oatmeal, cream of wheat, fish, baked potatoes) to minimize discomfort - No choking on food, but avoids harsh foods  due to pain - Using throat spray for relief without significant benefit - no muffled speak, no choking, no stridor or SOB  Fever and night sweats - Low-grade fever fluctuating between normal and 100F - Occasional night sweats - History of night sweats and temperature fluctuations for a long time  Back pain - History of multiple back surgeries - Uses spinal cord stimulator for back pain - Takes muscle relaxers as needed, which help with sleep - Avoids NSAIDs due to stomach issues, uses Tylenol  for pain  management  Ophthalmologic history - History of cataract surgery on both eyes - Most recent left eye cataract surgery occurred last week    Current Outpatient Medications:    acetaminophen  (TYLENOL ) 650 MG CR tablet, Take 1,300 mg by mouth every 8 (eight) hours as needed for pain., Disp: , Rfl:    cholecalciferol  (VITAMIN D3) 25 MCG (1000 UT) tablet, Take 2,000 Units by mouth daily. , Disp: , Rfl:    Multiple Vitamins-Minerals (CENTRUM SILVER ULTRA MENS) TABS, Take 1 tablet by mouth daily., Disp: , Rfl:    oxybutynin  (DITROPAN  XL) 15 MG 24 hr tablet, Take 1 tablet (15 mg total) by mouth daily., Disp: 30 tablet, Rfl: 11   tiZANidine  (ZANAFLEX ) 4 MG tablet, Take 0.5-1.5 tablets (2-6 mg total) by mouth every 8 (eight) hours as needed for muscle spasms (muscle tightness)., Disp: 90 tablet, Rfl: 3  Patient Active Problem List   Diagnosis Date Noted   Partial small bowel obstruction (HCC) 10/17/2023   Acute renal failure superimposed on stage 3a chronic kidney disease (HCC) 10/17/2023   Other chronic pain 04/05/2022   S/P fusion of sacroiliac joint 04/05/2022   History of lumbar spinal fusion 04/05/2022   Benign prostatic hyperplasia 04/05/2022   Multiple myeloma (HCC) 04/05/2022   Stage 3a chronic kidney disease (HCC) 04/05/2022   Hypertension 04/05/2022   Spinal cord stimulator status 03/03/2018   Major depressive disorder in partial remission (HCC) 03/03/2018   History of colon cancer 08/19/2017   OP (osteoporosis) 04/19/2017   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   Anxiety disorder 10/04/2014   Gastroesophageal reflux disease without esophagitis 10/04/2014    Past Surgical History:  Procedure Laterality Date   BACK SURGERY     CATARACT EXTRACTION W/PHACO Right 10/02/2023   Procedure: PHACOEMULSIFICATION, CATARACT, WITH IOL INSERTION 2.53 00:27.3;  Surgeon: Mittie Gaskin, MD;  Location: [facility];  Service: Ophthalmology;  Laterality: Right;   CATARACT  EXTRACTION W/PHACO Left 10/16/2023   Procedure: PHACOEMULSIFICATION, CATARACT, WITH IOL INSERTION 4.75 00:32.3;  Surgeon: Mittie Gaskin, MD;  Location: [facility];  Service: Ophthalmology;  Laterality: Left;   COLON SURGERY  2001   resection, 2nd surgery for scar tissue removal   COLONOSCOPY     COLONOSCOPY WITH PROPOFOL  N/A 03/26/2016   Procedure: COLONOSCOPY WITH PROPOFOL ;  Surgeon: Gladis RAYMOND Mariner, MD;  Location: [facility];  Service: Endoscopy;  Laterality: N/A;   COLONOSCOPY WITH PROPOFOL  N/A 06/16/2021   Procedure: COLONOSCOPY WITH PROPOFOL ;  Surgeon: Therisa Bi, MD;  Location: [facility];  Service: Gastroenterology;  Laterality: N/A;   CYSTOSCOPY WITH URETEROSCOPY AND STENT PLACEMENT Left 02/21/2018   Procedure: URETEROSCOPY, LASER LITHOTRIPSY, STONE REMOVAL AND STENT PLACEMENT;  Surgeon: Twylla Glendia BROCKS, MD;  Location: [facility];  Service: Urology;  Laterality: Left;   CYSTOSCOPY/URETEROSCOPY/HOLMIUM LASER/STENT PLACEMENT Right 09/03/2017   Procedure: CYSTOSCOPY/URETEROSCOPY/HOLMIUM LASER/STENT PLACEMENT;  Surgeon: Twylla Glendia BROCKS, MD;  Location: [facility];  Service: Urology;  Laterality: Right;   ESOPHAGOGASTRODUODENOSCOPY  10/2010  EYE SURGERY  1994 and 2010   RKA in 26 and [place] in 2010   FLEXIBLE SIGMOIDOSCOPY N/A 07/06/2018   Procedure: FLEXIBLE SIGMOIDOSCOPY;  Surgeon: Toledo, Ladell POUR, MD;  Location: [facility] END[facility];  Service: Gastroenterology;  Laterality: N/A;   HERNIA REPAIR  1970   lower left   HOLMIUM LASER APPLICATION Left 02/21/2018   Procedure: HOLMIUM LASER APPLICATION;  Surgeon: Twylla Glendia BROCKS, MD;  Location: [facility];  Service: Urology;  Laterality: Left;   LUMBAR FUSION     L4-L5, L5-S1   SACROILIAC JOINT FUSION     SHOULDER SURGERY Left    SPINAL CORD STIMULATOR IMPLANT      Family History  Problem Relation Age of Onset   Lymphoma Mother    Arthritis Mother        deceased   Cancer Mother    Lung cancer Father    Alcohol abuse  Father        deceased   Cancer Father    Heart disease Other        grandparents   Hyperlipidemia Sister    Diabetes Brother    Diabetes Son    Alcohol abuse Brother    Arthritis Brother    Depression Brother    Diabetes Brother    Alcohol abuse Sister    Arthritis Sister    Depression Sister    Alcohol abuse Son    Early death Son        truck accident 67 at age 48    Social History   Tobacco Use   Smoking status: Former    Current packs/day: 0.00    Average packs/day: 1 pack/day for 10.0 years (10.0 ttl pk-yrs)    Types: Cigarettes    Start date: 03/06/1960    Quit date: 03/06/1970    Years since quitting: 53.6   Smokeless tobacco: Never   Tobacco comments:    started at age 34 and quit at age 59  Vaping Use   Vaping status: Never Used  Substance Use Topics   Alcohol use: No   Drug use: No     Allergies  Allergen Reactions   Bee Venom Swelling    Health Maintenance  Topic Date Due   DEXA SCAN  Never done   Zoster Vaccines- Shingrix (1 of 2) Never done   DTaP/Tdap/Td (2 - Td or Tdap) 07/07/2022   Influenza Vaccine  09/06/2023   COVID-19 Vaccine (4 - 2025-26 season) 10/07/2023   Medicare Annual Wellness (AWV)  05/29/2024   Colonoscopy  06/17/2026   Pneumococcal Vaccine: 50+ Years  Completed   Hepatitis C Screening  Completed   HPV VACCINES  Aged Out   Meningococcal B Vaccine  Aged Out    Chart Review Today: I personally reviewed active problem list, medication list, allergies, family history, social history, health maintenance, notes from last encounter, lab results, imaging with the patient/caregiver today.   Review of Systems  Constitutional: Negative.   HENT: Negative.    Eyes: Negative.   Respiratory: Negative.    Cardiovascular: Negative.   Gastrointestinal: Negative.   Endocrine: Negative.   Genitourinary: Negative.   Musculoskeletal: Negative.   Skin: Negative.   Allergic/Immunologic: Negative.   Neurological: Negative.    Hematological: Negative.   Psychiatric/Behavioral: Negative.    All other systems reviewed and are negative.    Objective:   Vitals:   10/24/23 1003  BP: 138/80  Pulse: 79  Resp: 16  Temp: 98.2 F (36.8 C)  SpO2: 96%  Weight: 127 lb (57.6 kg)  Height: 5' 7 (1.702 m)    Body mass index is 19.89 kg/m.  Physical Exam Vitals and nursing note reviewed.  Constitutional:      General: He is not in acute distress.    Appearance: Normal appearance. He is well-developed. He is not ill-appearing, toxic-appearing or diaphoretic.  HENT:     Head: Normocephalic and atraumatic.     Right Ear: Tympanic membrane, ear canal and external ear normal. There is no impacted cerumen.     Left Ear: Tympanic membrane, ear canal and external ear normal. There is no impacted cerumen.     Nose: Nose normal. No congestion or rhinorrhea.     Mouth/Throat:     Mouth: Mucous membranes are moist.     Tongue: Tongue does not deviate from midline.     Palate: No mass and lesions.     Pharynx: Oropharynx is clear. Uvula midline. Posterior oropharyngeal erythema present. No oropharyngeal exudate, uvula swelling or postnasal drip.     Tonsils: No tonsillar exudate. 0 on the right. 0 on the left.     Comments: Left Soft palate left tonsillar p[illar mild sweeing and erythema  Eyes:     General: No scleral icterus.       Right eye: No discharge.        Left eye: No discharge.     Conjunctiva/sclera: Conjunctivae normal.  Neck:     Trachea: Trachea normal. No tracheal deviation.     Comments: Scratchy voice Cardiovascular:     Rate and Rhythm: Normal rate.     Pulses: Normal pulses.     Heart sounds: Normal heart sounds.  Pulmonary:     Effort: Pulmonary effort is normal. No respiratory distress.     Breath sounds: No stridor.  Abdominal:     General: Bowel sounds are normal. There is no distension.     Palpations: Abdomen is soft.  Musculoskeletal:     Cervical back: Normal range of motion and  neck supple.  Skin:    General: Skin is warm and dry.     Findings: No rash.  Neurological:     Mental Status: He is alert.     Motor: No abnormal muscle tone.     Coordination: Coordination normal.     Gait: Gait normal.  Psychiatric:        Mood and Affect: Mood normal.        Behavior: Behavior normal.      Functional Status Survey:   Results for orders placed or performed in visit on 10/23/23  Bladder Scan (Post Void Residual) in office   Collection Time: 10/23/23 10:16 AM  Result Value Ref Range   Scan Result 4mL       Assessment & Plan:    Assessment & Plan  1. Hospital discharge follow-up (Primary) Reviewed admission and discharge summary, imaging, procedures, labs   2. History of small bowel obstruction Small bowel obstruction, resolved Recent hospitalization for small bowel obstruction, now resolved. No current symptoms of obstruction such as vomiting or inability to pass stool. Reports improvement in gastrointestinal symptoms post-hospitalization.  Abd soft, NBSx4   3. Other chronic pain Refills- - tiZANidine  (ZANAFLEX ) 4 MG tablet; Take 0.5-1.5 tablets (2-6 mg total) by mouth every 8 (eight) hours as needed for muscle spasms (muscle tightness).  Dispense: 90 tablet; Refill: 3 Chronic low back pain with spinal cord stimulator Chronic low back pain exacerbated by recent hospitalization and prolonged bed rest. Spinal  cord stimulator in place but provides limited relief. Avoids narcotic pain medications and uses Tylenol  for pain management. Muscle relaxers used as needed for sleep and pain relief. - Refill prescription for muscle relaxers, last Rx was from 2023 - Advise use of Tylenol  for pain management. - Discuss potential short-term use of NSAIDs like Aleve or naproxen for additional pain relief, with caution due to gastrointestinal concerns, short term use may give him some relief of his throat pain and swelling too (~3 d trial prn)  4. Multiple myeloma,  remission status unspecified (HCC) Per Hem/onc, recent OV with Dr. KATHEE 08/19/2023  5. Primary hypertension BP a little elevated today, but pt is in pain BP Readings from Last 3 Encounters:  10/24/23 138/80  10/23/23 (!) 172/99  10/22/23 135/79    6. Pharyngitis, unspecified etiology  Post-NG tube placement pharyngitis Post-NG tube placement pharyngitis with throat pain, hoarseness, and referred ear pain. Left side of soft palate and tonsillar pillar more swollen than right but no significant red flags for abscess or severe infection - uvula midline, phonation scratchy not muffled - explained with procedure this can be a risk or temporary SE, able to speak, swallow and breath.  He reports low-grade fever present which is not new for him? Suspect symptoms likely due to inflammation and irritation from NG tube placement. Healing process may take days to two weeks. - Advise salt water gargles or tea with honey for soothing effect. - Prescribe viscous lidocaine  for temporary pain relief. - Advise against using caustic mouthwashes like Listerine. - Monitor for red flags such as worsening pain, muffled voice, or respiratory distress. - Consider imaging or ENT consult if symptoms persist or worsen - Advise home COVID test if fever or other symptoms suggestive of illness develop. Red flags reviewed with pt and wife and they verbalized understanding of ER precautions.  Showed pt and wife pictures of tonsilar abscesses and conerning findings and features as well as sx to watch for, I have no concerns today about this and have discussed imaging is not indicated, but I did offer neck plain films if they wanted, they wishes to wait a few more days to see if he improves.   Recording duration: 26 minutes    Return for As needed if not improving.   Michelene Cower, PA-C 10/24/23 10:23 AM

## 2023-10-24 NOTE — Telephone Encounter (Signed)
 FYI Only or Action Required?: FYI only for provider.  Patient was last seen in primary care on 08/15/2023 by Gareth Mliss FALCON, FNP.  Called Nurse Triage reporting Sore Throat.  Symptoms began a week ago.  Interventions attempted: Prescription medications: throat spray, Rest, hydration, or home remedies, and Dietary changes.  Symptoms are: gradually worsening.  Triage Disposition: See Physician Within 24 Hours  Patient/caregiver understands and will follow disposition?: Yes    Copied from CRM [id number]. Topic: Clinical - Red Word Triage >> Oct 24, 2023  9:27 AM Rea ORN wrote: Red Word that prompted transfer to Nurse Triage: throat pain. Pt was discharged from [facility] 9/16. Pt was seen for a partial bowel obstruction. Pt stated he called the surgical team first and they advised that he call PCP. Pt thinks he has an abscess in his throat from the tubing.  Pt would like a same day appt with anyone but would prefer Dr. Bernardo if she is available. Reason for Disposition  SEVERE throat pain (e.g., excruciating)  Answer Assessment - Initial Assessment Questions Additional info; D/c from hospital 10/22/23, partial bowel obstruction.  He has throat pain from gastric tube from decompression X 4 days. He was discharged with numbing spray but this is not helpful. He is on soft diet. Patient is worried for abscess. Family called and spoke with surgeon they deferred to pcp.     1. ONSET: When did the throat start hurting? (Hours or days ago)      Last week  2. SEVERITY: How bad is the sore throat? (Scale 1-10; mild, moderate or severe)     severe 3. STREP EXPOSURE: Has there been any exposure to strep within the past week? If Yes, ask: What type of contact occurred?      Recent hospitalization 4.  VIRAL SYMPTOMS: Are there any symptoms of a cold, such as a runny nose, cough, hoarse voice or red eyes?      Hoarse voice 5. FEVER: Do you have a fever? If Yes, ask: What is  your temperature, how was it measured, and when did it start?     yesterday 6. PUS ON THE TONSILS: Is there pus on the tonsils in the back of your throat?     no 7. OTHER SYMPTOMS: Do you have any other symptoms? (e.g., difficulty breathing, headache, rash)     Ear pain  Protocols used: Sore Throat-A-AH

## 2023-10-24 NOTE — Telephone Encounter (Signed)
 Pt said that he had a small bowel obstruction a week ago Thursday and then they had to put a tube in his throat and the person was having a hard time after several tries. This this was in for about 4 to 5 days. He is now running a fever and his throat is very sore hard to bare and is very hoarse still . Pt wants to know what else he can do to get comfort. Please call pt [phone]

## 2023-11-25 ENCOUNTER — Ambulatory Visit (INDEPENDENT_AMBULATORY_CARE_PROVIDER_SITE_OTHER): Admitting: Physician Assistant

## 2023-11-25 DIAGNOSIS — N3281 Overactive bladder: Secondary | ICD-10-CM | POA: Diagnosis not present

## 2023-11-25 NOTE — Progress Notes (Signed)
 PTNS  Session # 1  Health & Social Factors: no change Caffeine: 5 Alcohol: 0 Daytime voids #per day: 7-8 Night-time voids #per night: 2 Urgency: mild Incontinence Episodes #per day: 0 Ankle used: right Treatment Setting: 10 Feeling/ Response: toe flex Comments: Consent signed. Patient tolerated well.  Performed By: Camera Krienke, PA-C   Follow Up: 1 week

## 2023-11-25 NOTE — Patient Instructions (Signed)

## 2023-12-02 ENCOUNTER — Encounter: Payer: Self-pay | Admitting: Physician Assistant

## 2023-12-02 ENCOUNTER — Ambulatory Visit (INDEPENDENT_AMBULATORY_CARE_PROVIDER_SITE_OTHER): Admitting: Physician Assistant

## 2023-12-02 VITALS — BP 171/93 | HR 93 | Ht 67.0 in | Wt 136.2 lb

## 2023-12-02 DIAGNOSIS — N3281 Overactive bladder: Secondary | ICD-10-CM

## 2023-12-02 NOTE — Patient Instructions (Signed)

## 2023-12-02 NOTE — Progress Notes (Signed)
 PTNS  Session # 2  Health & Social Factors: no change Caffeine: 3 Alcohol: 0 Daytime voids #per day: 8-9 Night-time voids #per night: 3 Urgency: strong Incontinence Episodes #per day: <1 Ankle used: right Treatment Setting: 5 Feeling/ Response: both Comments: Patient tolerated well  Performed By: Lucie Hones, PA-C   Follow Up: 1 week

## 2023-12-09 ENCOUNTER — Ambulatory Visit (INDEPENDENT_AMBULATORY_CARE_PROVIDER_SITE_OTHER): Admitting: Physician Assistant

## 2023-12-09 DIAGNOSIS — N3281 Overactive bladder: Secondary | ICD-10-CM | POA: Diagnosis not present

## 2023-12-09 NOTE — Progress Notes (Signed)
 PTNS  Session # 3  Health & Social Factors: no change Caffeine: 6 Alcohol: 0 Daytime voids #per day: 6-7 Night-time voids #per night: 3 Urgency: strong Incontinence Episodes #per day: 0 Ankle used: right Treatment Setting: 6 Feeling/ Response: Both Comments: Patient tolerated well.  Performed By: Kostantinos Tallman, PA-C   Follow Up: 1 week

## 2023-12-09 NOTE — Patient Instructions (Signed)

## 2023-12-16 ENCOUNTER — Encounter: Payer: Self-pay | Admitting: Physician Assistant

## 2023-12-16 ENCOUNTER — Ambulatory Visit: Admitting: Physician Assistant

## 2023-12-16 VITALS — BP 159/89 | HR 69 | Ht 67.0 in | Wt 136.2 lb

## 2023-12-16 DIAGNOSIS — N3281 Overactive bladder: Secondary | ICD-10-CM | POA: Diagnosis not present

## 2023-12-16 NOTE — Patient Instructions (Signed)

## 2023-12-16 NOTE — Progress Notes (Signed)
 PTNS  Session # 4  Health & Social Factors: no change Caffeine: 6 Alcohol: 0 Daytime voids #per day: 6 Night-time voids #per night: 3 Urgency: strong Incontinence Episodes #per day: 6 Ankle used: right Treatment Setting: 7 Feeling/ Response: both Comments: Patient tolerated well.  Performed By: Kawana Hegel, PA-C   Follow Up: 1 week

## 2023-12-23 ENCOUNTER — Ambulatory Visit (INDEPENDENT_AMBULATORY_CARE_PROVIDER_SITE_OTHER): Admitting: Physician Assistant

## 2023-12-23 DIAGNOSIS — N3281 Overactive bladder: Secondary | ICD-10-CM | POA: Diagnosis not present

## 2023-12-23 NOTE — Patient Instructions (Signed)

## 2023-12-23 NOTE — Progress Notes (Signed)
 PTNS  Session # 5  Health & Social Factors: no change Caffeine: 5 Alcohol: 0 Daytime voids #per day: 7 Night-time voids #per night: 3 Urgency: strong Incontinence Episodes #per day: 1 Ankle used: right Treatment Setting: 8 Feeling/ Response: both Comments: Patient tolerated well  Performed By: Lucie Hones, PA-C   Follow Up: 1 week

## 2023-12-30 ENCOUNTER — Ambulatory Visit (INDEPENDENT_AMBULATORY_CARE_PROVIDER_SITE_OTHER): Admitting: Physician Assistant

## 2023-12-30 DIAGNOSIS — N3281 Overactive bladder: Secondary | ICD-10-CM

## 2023-12-30 NOTE — Patient Instructions (Signed)

## 2023-12-30 NOTE — Progress Notes (Signed)
 PTNS  Session # 6  Health & Social Factors: no change Caffeine: 6 Alcohol: 0 Daytime voids #per day: 7-8 Night-time voids #per night: 2-3 Urgency: strong to severe Incontinence Episodes #per day: 5 Ankle used: right Treatment Setting: 9 Feeling/ Response: both Comments: Patient tolerated well  Performed By: Lucie Hones, PA-C   Follow Up: 1 week

## 2024-01-06 ENCOUNTER — Ambulatory Visit

## 2024-01-06 DIAGNOSIS — N3281 Overactive bladder: Secondary | ICD-10-CM | POA: Diagnosis not present

## 2024-01-06 NOTE — Progress Notes (Signed)
 PTNS  Session # 7  Health & Social Factors: no change Caffeine: 6-7 Alcohol: 0 Daytime voids #per day: 6-7 Night-time voids #per night: 2 Urgency: strong Incontinence Episodes #per day: 2-3 Ankle used: right Treatment Setting: 9 Feeling/ Response: toe flex Comments: n/a  Performed By: Mathew Pinal, RN  Follow Up: 1 week

## 2024-01-13 ENCOUNTER — Ambulatory Visit: Admitting: Physician Assistant

## 2024-01-13 ENCOUNTER — Encounter: Payer: Self-pay | Admitting: Physician Assistant

## 2024-01-13 VITALS — BP 165/83 | HR 69 | Ht 67.0 in | Wt 137.6 lb

## 2024-01-13 DIAGNOSIS — N3281 Overactive bladder: Secondary | ICD-10-CM | POA: Diagnosis not present

## 2024-01-13 NOTE — Progress Notes (Signed)
 PTNS  Session # 8  Health & Social Factors: no change Caffeine: 8 Alcohol: 0 Daytime voids #per day: 7 Night-time voids #per night: 2 Urgency: strong Incontinence Episodes #per day: 1-2 Ankle used: right Treatment Setting: 9 Feeling/ Response: sensory Comments: Patient tolerated well.  Performed By: Dechelle Attaway, PA-C   Follow Up: 1 week

## 2024-01-13 NOTE — Patient Instructions (Signed)

## 2024-01-20 ENCOUNTER — Ambulatory Visit: Admitting: Physician Assistant

## 2024-01-20 DIAGNOSIS — N3281 Overactive bladder: Secondary | ICD-10-CM | POA: Diagnosis not present

## 2024-01-20 NOTE — Progress Notes (Signed)
 PTNS  Session # 9  Health & Social Factors: no change Caffeine: 8 Alcohol: 0 Daytime voids #per day: 7 Night-time voids #per night: 2 Urgency: strong Incontinence Episodes #per day: 0 Ankle used: right Treatment Setting: 10 Feeling/ Response: both Comments: Patient tolerated well.  Performed By: Naamah Boggess, PA-C   Follow Up: 1 week

## 2024-01-20 NOTE — Patient Instructions (Signed)

## 2024-02-03 ENCOUNTER — Ambulatory Visit (INDEPENDENT_AMBULATORY_CARE_PROVIDER_SITE_OTHER): Admitting: Physician Assistant

## 2024-02-03 DIAGNOSIS — N3281 Overactive bladder: Secondary | ICD-10-CM | POA: Diagnosis not present

## 2024-02-03 NOTE — Patient Instructions (Signed)

## 2024-02-03 NOTE — Progress Notes (Signed)
 PTNS  Session # 10  Health & Social Factors: no change Caffeine: 8 Alcohol: 0 Daytime voids #per day: 7-8 Night-time voids #per night: 2 Urgency: strong Incontinence Episodes #per day: 1 Ankle used: right Treatment Setting: 8 Feeling/ Response: both Comments: Patient tolerated well.  Performed By: Cheryln Balcom, PA-C   Follow Up: 1 week

## 2024-02-10 ENCOUNTER — Ambulatory Visit: Admitting: Physician Assistant

## 2024-02-10 VITALS — BP 153/86 | HR 68 | Ht 67.0 in | Wt 136.0 lb

## 2024-02-10 DIAGNOSIS — N3281 Overactive bladder: Secondary | ICD-10-CM

## 2024-02-10 NOTE — Progress Notes (Signed)
 PTNS  Session # 11  Health & Social Factors: no change Caffeine: 8 Alcohol: 0 Daytime voids #per day: 8 Night-time voids #per night: 2 Urgency: strong Incontinence Episodes #per day: 1 Ankle used: right Treatment Setting: 6 Feeling/ Response: both Comments: Patient tolerated well.  Performed By: Jazzmyn Filion, PA-C   Follow Up: 1 week

## 2024-02-10 NOTE — Patient Instructions (Signed)

## 2024-02-12 ENCOUNTER — Inpatient Hospital Stay: Attending: Internal Medicine

## 2024-02-12 DIAGNOSIS — D472 Monoclonal gammopathy: Secondary | ICD-10-CM

## 2024-02-12 LAB — CBC WITH DIFFERENTIAL (CANCER CENTER ONLY)
Abs Immature Granulocytes: 0.02 K/uL (ref 0.00–0.07)
Basophils Absolute: 0 K/uL (ref 0.0–0.1)
Basophils Relative: 1 %
Eosinophils Absolute: 0.7 K/uL — ABNORMAL HIGH (ref 0.0–0.5)
Eosinophils Relative: 8 %
HCT: 42.4 % (ref 39.0–52.0)
Hemoglobin: 14.5 g/dL (ref 13.0–17.0)
Immature Granulocytes: 0 %
Lymphocytes Relative: 28 %
Lymphs Abs: 2.3 K/uL (ref 0.7–4.0)
MCH: 32.3 pg (ref 26.0–34.0)
MCHC: 34.2 g/dL (ref 30.0–36.0)
MCV: 94.4 fL (ref 80.0–100.0)
Monocytes Absolute: 0.7 K/uL (ref 0.1–1.0)
Monocytes Relative: 9 %
Neutro Abs: 4.5 K/uL (ref 1.7–7.7)
Neutrophils Relative %: 54 %
Platelet Count: 377 K/uL (ref 150–400)
RBC: 4.49 MIL/uL (ref 4.22–5.81)
RDW: 11.9 % (ref 11.5–15.5)
WBC Count: 8.3 K/uL (ref 4.0–10.5)
nRBC: 0 % (ref 0.0–0.2)

## 2024-02-12 LAB — CMP (CANCER CENTER ONLY)
ALT: 12 U/L (ref 0–44)
AST: 23 U/L (ref 15–41)
Albumin: 4.5 g/dL (ref 3.5–5.0)
Alkaline Phosphatase: 78 U/L (ref 38–126)
Anion gap: 12 (ref 5–15)
BUN: 20 mg/dL (ref 8–23)
CO2: 27 mmol/L (ref 22–32)
Calcium: 10.3 mg/dL (ref 8.9–10.3)
Chloride: 103 mmol/L (ref 98–111)
Creatinine: 1.26 mg/dL — ABNORMAL HIGH (ref 0.61–1.24)
GFR, Estimated: 59 mL/min — ABNORMAL LOW
Glucose, Bld: 116 mg/dL — ABNORMAL HIGH (ref 70–99)
Potassium: 3.9 mmol/L (ref 3.5–5.1)
Sodium: 141 mmol/L (ref 135–145)
Total Bilirubin: 0.4 mg/dL (ref 0.0–1.2)
Total Protein: 7.3 g/dL (ref 6.5–8.1)

## 2024-02-13 LAB — KAPPA/LAMBDA LIGHT CHAINS
Kappa free light chain: 74.2 mg/L — ABNORMAL HIGH (ref 3.3–19.4)
Kappa, lambda light chain ratio: 4.64 — ABNORMAL HIGH (ref 0.26–1.65)
Lambda free light chains: 16 mg/L (ref 5.7–26.3)

## 2024-02-17 ENCOUNTER — Ambulatory Visit: Admitting: Physician Assistant

## 2024-02-17 DIAGNOSIS — N3281 Overactive bladder: Secondary | ICD-10-CM | POA: Diagnosis not present

## 2024-02-17 NOTE — Progress Notes (Signed)
 PTNS  Session # 12  Health & Social Factors: no change Caffeine: 5 Alcohol: 0 Daytime voids #per day: 7-8 Night-time voids #per night: 1-2 Urgency: strong Incontinence Episodes #per day: 0 Ankle used: right Treatment Setting: 5 Feeling/ Response: both Comments: patient tolerated well.  Performed By: Jamicheal Heard, PA-C   Follow Up: 1 month post-PTNS follow up

## 2024-02-18 LAB — MULTIPLE MYELOMA PANEL, SERUM
Albumin SerPl Elph-Mcnc: 4 g/dL (ref 2.9–4.4)
Albumin/Glob SerPl: 1.5 (ref 0.7–1.7)
Alpha 1: 0.2 g/dL (ref 0.0–0.4)
Alpha2 Glob SerPl Elph-Mcnc: 0.7 g/dL (ref 0.4–1.0)
B-Globulin SerPl Elph-Mcnc: 1 g/dL (ref 0.7–1.3)
Gamma Glob SerPl Elph-Mcnc: 1 g/dL (ref 0.4–1.8)
Globulin, Total: 2.8 g/dL (ref 2.2–3.9)
IgA: 345 mg/dL (ref 61–437)
IgG (Immunoglobin G), Serum: 869 mg/dL (ref 603–1613)
IgM (Immunoglobulin M), Srm: 53 mg/dL (ref 15–143)
M Protein SerPl Elph-Mcnc: 0.3 g/dL — ABNORMAL HIGH
Total Protein ELP: 6.8 g/dL (ref 6.0–8.5)

## 2024-02-19 ENCOUNTER — Inpatient Hospital Stay: Admitting: Internal Medicine

## 2024-02-19 ENCOUNTER — Encounter: Payer: Self-pay | Admitting: Internal Medicine

## 2024-02-19 VITALS — BP 145/84 | HR 76 | Temp 98.1°F | Resp 12 | Ht 67.0 in | Wt 135.5 lb

## 2024-02-19 DIAGNOSIS — D472 Monoclonal gammopathy: Secondary | ICD-10-CM | POA: Diagnosis not present

## 2024-02-19 NOTE — Assessment & Plan Note (Addendum)
#   SMOLDERING MULTIPLE MYELOMA [2013 s/p BMBx;  [place] ]. No clinical evidence of progression.    # JAN 2026-: M protein-kappa lambda light chain ratio  overall clinically stable. Do not suspect any clinical progression of multiple myeloma. stable.  # SEP 2025- Small bowel obstruction the patient with history of colon resection for colon cancer: improved s/p conservative management. stable.  # CKD stage II/III discussed the potential causes; check BP at home. s/p evaluation with Dr.Sninski PSA June 2023 2.0 on Flomax ; stable.  # Arthritis- recommend HOLD off NSAIDS; ok to take tylenol  prn as needed. stable.   # DISPOSITION:  # follow up in 6 months- 1 week prior labs-MD-cbc/cmp/MM panel/K-L light chains- -Dr.B

## 2024-02-19 NOTE — Progress Notes (Signed)
 No concerns today.

## 2024-02-19 NOTE — Progress Notes (Signed)
 [facility] OFFICE PROGRESS NOTE  Patient Care Team: Leavy Mole, PA-C (Inactive) as PCP - General (Family Medicine) Rennie Cindy SAUNDERS, MD as Consulting Physician (Oncology)   Cancer Staging  No matching staging information was found for the patient.    Oncology History Overview Note  # NOV 2013- SMOLDERING MULTIPLE MYELOMA [IgA Kappa; BMBx- 10-12% plasma cell;Dr.Trillo [place]]   # 2001- COLON CA STAGE III [s/p chemo; [place]]  # colonoscopy- 2018/ Dr.Anna  # ? UTI/kidney stone [Dr.Stoiff]- s/p Anti-biotics. S/p removal   DIAGNOSIS: Smoldering myeloma  GOALS: Cure  CURRENT/MOST RECENT THERAPY: Surveillance    Smoldering multiple myeloma (SMM)   INTERVAL HISTORY: Ambulating independently.  Alone.   [name] 74 y.o.  male pleasant patient above history of smoldering myeloma currently on surveillance is here for follow-up.  Discussed the use of AI scribe software for clinical note transcription with the patient, who gave verbal consent to proceed.  History of Present Illness   [name] is a 74 year old male with smoldering multiple myeloma who presents for routine surveillance follow-up.  He remains on active surveillance for smoldering multiple myeloma. He remains physically active, bowling weekly, and has not experienced functional decline.  He continues to have persistent bladder dysfunction, describing a sensation of reduced bladder capacity with ongoing symptoms despite completing twelve sessions of percutaneous tibial nerve stimulation. He previously discontinued oxybutynin  due to severe xerostomia and xerosis. He is not currently taking oxybutynin .  In September 2025, he was hospitalized for a bowel obstruction characterized by severe and persistent emesis, inability to pass flatus, and absence of bowel movements following a meal. This episode occurred shortly after right eye cataract surgery. He underwent nasogastric tube decompression for  three days and was restricted to ice chips. He has a history of colon cancer with prior colorectal resection and laparoscopic lysis of adhesions, which provided temporary relief. He has not had recurrent obstructive symptoms since that hospitalization.  He drinks a significant amount of iced tea due to thirst but consumes little water. He remains active and independent in daily activities, though he stopped golfing approximately two months ago.       Review of Systems  Constitutional:  Negative for chills, diaphoresis, fever, malaise/fatigue and weight loss.  HENT:  Negative for nosebleeds and sore throat.   Eyes:  Negative for double vision.  Respiratory:  Negative for cough, hemoptysis, sputum production, shortness of breath and wheezing.   Cardiovascular:  Negative for chest pain, palpitations, orthopnea and leg swelling.  Gastrointestinal:  Negative for abdominal pain, blood in stool, constipation, diarrhea, heartburn, melena, nausea and vomiting.  Genitourinary:  Negative for dysuria, frequency and urgency.  Musculoskeletal:  Positive for back pain and joint pain.  Skin: Negative.  Negative for itching and rash.  Neurological:  Negative for dizziness, tingling, focal weakness, weakness and headaches.  Endo/Heme/Allergies:  Does not bruise/bleed easily.  Psychiatric/Behavioral:  Negative for depression. The patient is not nervous/anxious and does not have insomnia.       PAST MEDICAL HISTORY :  Past Medical History:  Diagnosis Date   Abnormal laboratory test    Sees Dr. Marina   Acid reflux    Allergy bee venom   mainly as a child   Anxiety 12/08/1969   Arthritis    Asthma 12/08/1953   mainly as a child   Chronic back pain    Colon cancer (HCC) 11/1999   stage 3 s/p colon resection   Depression    H/O Clostridium  difficile infection    History of chemotherapy    5-FU pump/leukovorin   History of kidney stones    Hypertension 04/05/2022   Hypothyroidism    NO MEDS NOW    Insomnia    Multiple myeloma (HCC) 12/2011   smoldering vs mgus   Nephrolithiasis 08/28/2017   Osteoporosis    Seasonal allergies     PAST SURGICAL HISTORY :   Past Surgical History:  Procedure Laterality Date   BACK SURGERY     CATARACT EXTRACTION W/PHACO Right 10/02/2023   Procedure: PHACOEMULSIFICATION, CATARACT, WITH IOL INSERTION 2.53 00:27.3;  Surgeon: Mittie Gaskin, MD;  Location: [facility];  Service: Ophthalmology;  Laterality: Right;   CATARACT EXTRACTION W/PHACO Left 10/16/2023   Procedure: PHACOEMULSIFICATION, CATARACT, WITH IOL INSERTION 4.75 00:32.3;  Surgeon: Mittie Gaskin, MD;  Location: [facility];  Service: Ophthalmology;  Laterality: Left;   COLON SURGERY  2001   resection, 2nd surgery for scar tissue removal   COLONOSCOPY     COLONOSCOPY WITH PROPOFOL  N/A 03/26/2016   Procedure: COLONOSCOPY WITH PROPOFOL ;  Surgeon: Gladis RAYMOND Mariner, MD;  Location: [facility] END[facility];  Service: Endoscopy;  Laterality: N/A;   COLONOSCOPY WITH PROPOFOL  N/A 06/16/2021   Procedure: COLONOSCOPY WITH PROPOFOL ;  Surgeon: Therisa Bi, MD;  Location: [facility] ENDOSCOPY;  Service: Gastroenterology;  Laterality: N/A;   CYSTOSCOPY WITH URETEROSCOPY AND STENT PLACEMENT Left 02/21/2018   Procedure: URETEROSCOPY, LASER LITHOTRIPSY, STONE REMOVAL AND STENT PLACEMENT;  Surgeon: Twylla Glendia BROCKS, MD;  Location: [facility];  Service: Urology;  Laterality: Left;   CYSTOSCOPY/URETEROSCOPY/HOLMIUM LASER/STENT PLACEMENT Right 09/03/2017   Procedure: CYSTOSCOPY/URETEROSCOPY/HOLMIUM LASER/STENT PLACEMENT;  Surgeon: Twylla Glendia BROCKS, MD;  Location: [facility];  Service: Urology;  Laterality: Right;   ESOPHAGOGASTRODUODENOSCOPY  10/2010   EYE SURGERY  1994 and 2010   [place]KA in 94 and [place] in 2010   FLEXIBLE SIGMOIDOSCOPY N/A 07/06/2018   Procedure: FLEXIBLE SIGMOIDOSCOPY;  Surgeon: Toledo, Ladell POUR, MD;  Location: [facility] ENDOSCOPY;  Service: Gastroenterology;  Laterality: N/A;   HERNIA REPAIR  1970    lower left   HOLMIUM LASER APPLICATION Left 02/21/2018   Procedure: HOLMIUM LASER APPLICATION;  Surgeon: Twylla Glendia BROCKS, MD;  Location: [facility];  Service: Urology;  Laterality: Left;   LUMBAR FUSION     L4-L5, L5-S1   SACROILIAC JOINT FUSION     SHOULDER SURGERY Left    SPINAL CORD STIMULATOR IMPLANT      FAMILY HISTORY :   Family History  Problem Relation Age of Onset   Lymphoma Mother    Arthritis Mother        deceased   Cancer Mother    Lung cancer Father    Alcohol abuse Father        deceased   Cancer Father    Heart disease Other        grandparents   Hyperlipidemia Sister    Diabetes Brother    Diabetes Son    Alcohol abuse Brother    Arthritis Brother    Depression Brother    Diabetes Brother    Alcohol abuse Sister    Arthritis Sister    Depression Sister    Alcohol abuse Son    Early death Son        truck accident 84 at age 59    SOCIAL HISTORY:   Social History   Tobacco Use   Smoking status: Former    Current packs/day: 0.00    Average packs/day: 1 pack/day for 10.0 years (10.0 ttl  pk-yrs)    Types: Cigarettes    Start date: 03/06/1960    Quit date: 03/06/1970    Years since quitting: 53.9   Smokeless tobacco: Never   Tobacco comments:    started at age 16 and quit at age 67  Vaping Use   Vaping status: Never Used  Substance Use Topics   Alcohol use: No   Drug use: No    ALLERGIES:  is allergic to bee venom.  MEDICATIONS:  Current Outpatient Medications  Medication Sig Dispense Refill   acetaminophen  (TYLENOL ) 650 MG CR tablet Take 1,300 mg by mouth every 8 (eight) hours as needed for pain.     cholecalciferol  (VITAMIN D3) 25 MCG (1000 UT) tablet Take 2,000 Units by mouth daily.      lidocaine  (XYLOCAINE ) 2 % solution Use as directed 5-10 mLs in the mouth or throat every 6 (six) hours as needed for mouth pain. 100 mL 0   Multiple Vitamins-Minerals (CENTRUM SILVER ULTRA MENS) TABS Take 1 tablet by mouth daily.     tiZANidine   (ZANAFLEX ) 4 MG tablet Take 0.5-1.5 tablets (2-6 mg total) by mouth every 8 (eight) hours as needed for muscle spasms (muscle tightness). 90 tablet 3   No current facility-administered medications for this visit.    PHYSICAL EXAMINATION: ECOG PERFORMANCE STATUS: 0 - Asymptomatic  BP (!) 145/84 (BP Location: Left Arm, Patient Position: Sitting, Cuff Size: Normal)   Pulse 76   Temp 98.1 F (36.7 C) (Tympanic)   Resp 12   Ht 5' 7 (1.702 m)   Wt 135 lb 8 oz (61.5 kg)   SpO2 98%   BMI 21.22 kg/m   Filed Weights   02/19/24 1039  Weight: 135 lb 8 oz (61.5 kg)     Physical Exam HENT:     Head: Normocephalic and atraumatic.     Mouth/Throat:     Pharynx: No oropharyngeal exudate.  Eyes:     Pupils: Pupils are equal, round, and reactive to light.  Cardiovascular:     Rate and Rhythm: Normal rate and regular rhythm.  Pulmonary:     Effort: No respiratory distress.     Breath sounds: No wheezing.  Abdominal:     General: Bowel sounds are normal. There is no distension.     Palpations: Abdomen is soft. There is no mass.     Tenderness: There is no abdominal tenderness. There is no guarding or rebound.  Musculoskeletal:        General: No tenderness. Normal range of motion.     Cervical back: Normal range of motion and neck supple.  Skin:    General: Skin is warm.  Neurological:     Mental Status: He is alert and oriented to person, place, and time.  Psychiatric:        Mood and Affect: Affect normal.    LABORATORY DATA:  I have reviewed the data as listed    Component Value Date/Time   NA 141 02/12/2024 0941   K 3.9 02/12/2024 0941   CL 103 02/12/2024 0941   CO2 27 02/12/2024 0941   GLUCOSE 116 (H) 02/12/2024 0941   BUN 20 02/12/2024 0941   CREATININE 1.26 (H) 02/12/2024 0941   CREATININE 1.18 08/15/2023 1110   CALCIUM  10.3 02/12/2024 0941   CALCIUM  9.6 12/28/2013 1428   PROT 7.3 02/12/2024 0941   PROT 6.7 06/27/2012 1136   ALBUMIN 4.5 02/12/2024 0941    ALBUMIN 3.5 06/27/2012 1136   AST 23 02/12/2024 0941  ALT 12 02/12/2024 0941   ALT 22 06/27/2012 1136   ALKPHOS 78 02/12/2024 0941   ALKPHOS 91 06/27/2012 1136   BILITOT 0.4 02/12/2024 0941   GFRNONAA 59 (L) 02/12/2024 0941   GFRNONAA 58 (L) 11/25/2017 1401   GFRAA >60 07/17/2019 0955   GFRAA 67 11/25/2017 1401    No results found for: SPEP, UPEP  Lab Results  Component Value Date   WBC 8.3 02/12/2024   NEUTROABS 4.5 02/12/2024   HGB 14.5 02/12/2024   HCT 42.4 02/12/2024   MCV 94.4 02/12/2024   PLT 377 02/12/2024      Chemistry      Component Value Date/Time   NA 141 02/12/2024 0941   K 3.9 02/12/2024 0941   CL 103 02/12/2024 0941   CO2 27 02/12/2024 0941   BUN 20 02/12/2024 0941   CREATININE 1.26 (H) 02/12/2024 0941   CREATININE 1.18 08/15/2023 1110      Component Value Date/Time   CALCIUM  10.3 02/12/2024 0941   CALCIUM  9.6 12/28/2013 1428   ALKPHOS 78 02/12/2024 0941   ALKPHOS 91 06/27/2012 1136   AST 23 02/12/2024 0941   ALT 12 02/12/2024 0941   ALT 22 06/27/2012 1136   BILITOT 0.4 02/12/2024 0941      RADIOGRAPHIC STUDIES: I have personally reviewed the radiological images as listed and agreed with the findings in the report. No results found.   ASSESSMENT & PLAN:  Smoldering multiple myeloma (SMM) # SMOLDERING MULTIPLE MYELOMA [2013 s/p BMBx;  [place] ]. No clinical evidence of progression.    # JAN 2026-: M protein-kappa lambda light chain ratio  overall clinically stable. Do not suspect any clinical progression of multiple myeloma. stable.  # SEP 2025- Small bowel obstruction the patient with history of colon resection for colon cancer: improved s/p conservative management. stable.  # CKD stage II/III discussed the potential causes; check BP at home. s/p evaluation with Dr.Stoioff-PSA June 2023 2.0 on Flomax ; stable.  # Arthritis- recommend HOLD off NSAIDS; ok to take tylenol  prn as needed. stable.   # DISPOSITION:  # follow up in 6  months- 1 week prior labs-MD-cbc/cmp/MM panel/K-L light chains- -Dr.B   Orders Placed This Encounter  Procedures   CBC with Differential (Cancer Center Only)    Standing Status:   Future    Expected Date:   08/18/2024    Expiration Date:   02/18/2025   CMP (Cancer Center only)    Standing Status:   Future    Expected Date:   08/18/2024    Expiration Date:   02/18/2025   Multiple Myeloma Panel (SPEP&IFE w/QIG)    Standing Status:   Future    Expected Date:   08/18/2024    Expiration Date:   02/18/2025   Kappa/lambda light chains    Standing Status:   Future    Expected Date:   08/18/2024    Expiration Date:   02/18/2025   All questions were answered. The patient knows to call the clinic with any problems, questions or concerns.      Cindy JONELLE Joe, MD 02/19/2024 11:22 AM

## 2024-03-16 ENCOUNTER — Ambulatory Visit: Admitting: Physician Assistant

## 2024-05-26 ENCOUNTER — Encounter: Admitting: Internal Medicine

## 2024-06-11 ENCOUNTER — Ambulatory Visit

## 2024-08-18 ENCOUNTER — Inpatient Hospital Stay

## 2024-08-25 ENCOUNTER — Inpatient Hospital Stay: Admitting: Internal Medicine
